# Patient Record
Sex: Male | Born: 1935 | Race: White | Hispanic: No | Marital: Married
Health system: Southern US, Community
[De-identification: ages and names within clinical notes are randomized; demographics above are authoritative.]

---

## 2006-11-16 ENCOUNTER — Ambulatory Visit: Payer: Self-pay | Admitting: Unknown Physician Specialty

## 2006-11-24 ENCOUNTER — Ambulatory Visit: Payer: Self-pay | Admitting: Unknown Physician Specialty

## 2007-02-01 ENCOUNTER — Ambulatory Visit: Payer: Self-pay | Admitting: Internal Medicine

## 2007-06-05 ENCOUNTER — Emergency Department: Payer: Self-pay | Admitting: Emergency Medicine

## 2007-06-20 ENCOUNTER — Ambulatory Visit: Payer: Self-pay | Admitting: Internal Medicine

## 2007-10-04 ENCOUNTER — Ambulatory Visit: Payer: Self-pay | Admitting: Internal Medicine

## 2007-12-20 ENCOUNTER — Ambulatory Visit: Payer: Self-pay | Admitting: Internal Medicine

## 2008-02-12 ENCOUNTER — Emergency Department: Payer: Self-pay | Admitting: Emergency Medicine

## 2008-11-13 IMAGING — CT CT CHEST W/ CM
1 of 2 series · 14 of 32 positions shown, 19 images · IV contrast (agent unspecified)
Comparison: none

REASON FOR EXAM: lung nodule
COMMENTS:

PROCEDURE:     CT  - CT CHEST WITH CONTRAST  - June 20, 2007 [DATE]
RESULT:     Comparison: Abdomen pelvis CT on 06/05/2007.
TECHNIQUE: CT examination of the chest was performed after intravenous
administration of 65 ml of 4sovue-HQM nonionic contrast. Collimation is 5
mm.

[Series 6: soft tissue · axial · 0.83mm/px · z∈[+636,+956]mm · 14 of 72 slices shown, 19 images]
[im 4/72  soft-tissue]
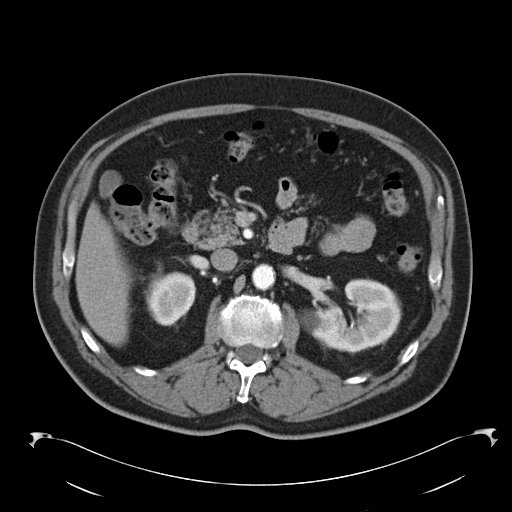
[im 4/72  bone]
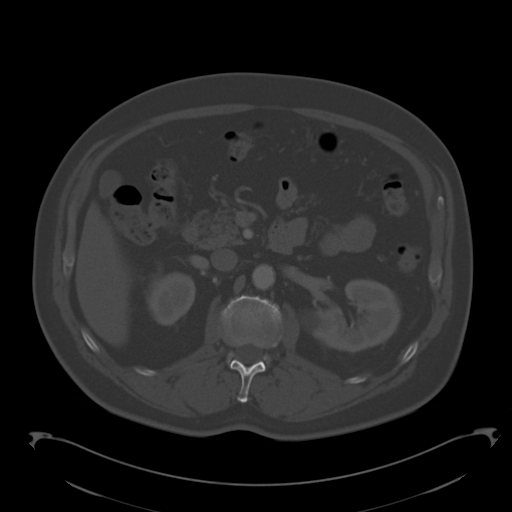
[im 11/72  soft-tissue]
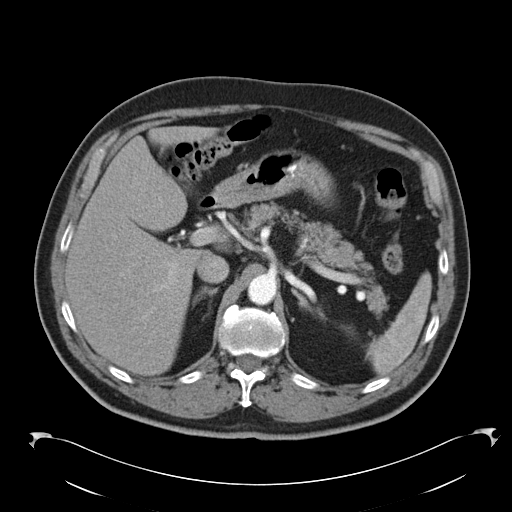
[im 15/72  soft-tissue]
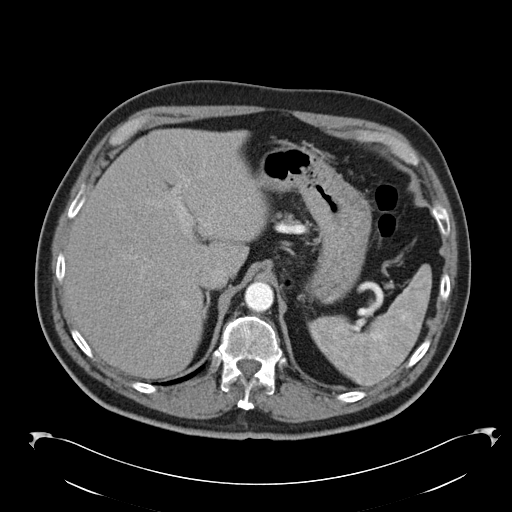
[im 22/72  soft-tissue]
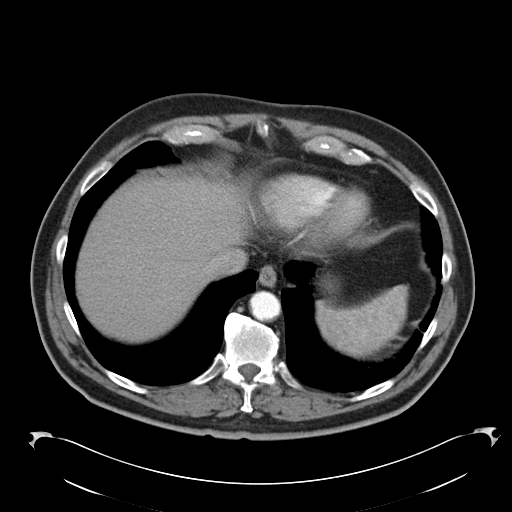
[im 25/72  soft-tissue]
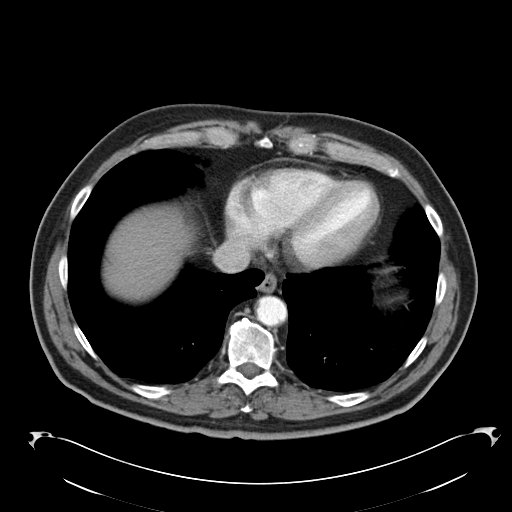
[im 32/72  soft-tissue]
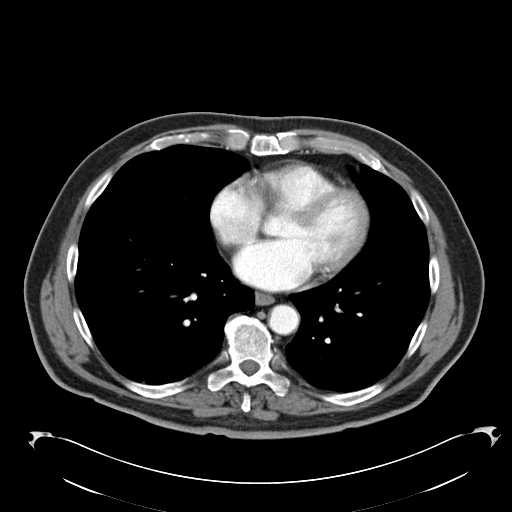
[im 36/72  soft-tissue]
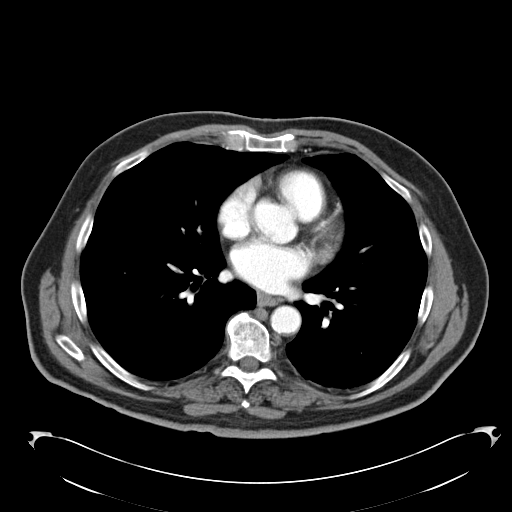
[im 40/72  soft-tissue]
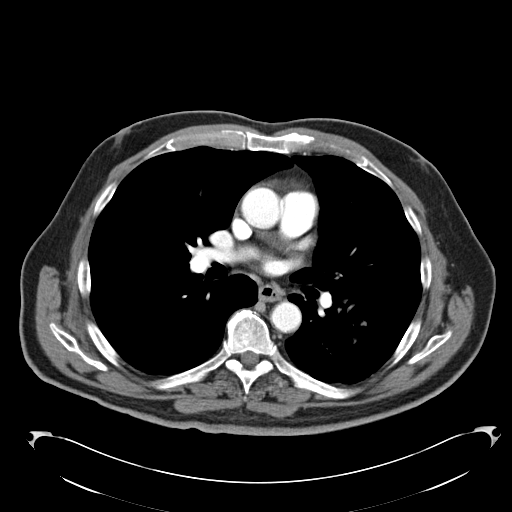
[im 47/72  soft-tissue]
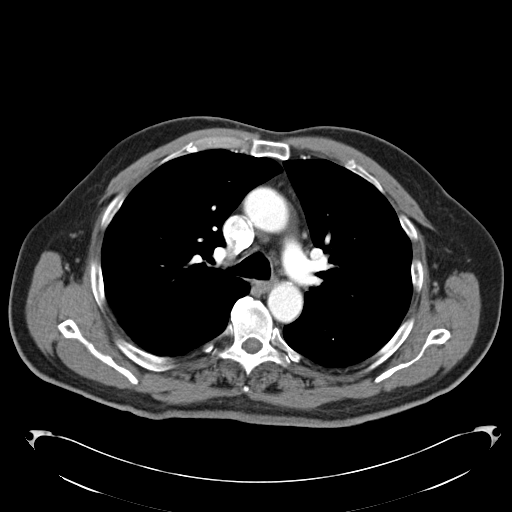
[im 47/72  bone]
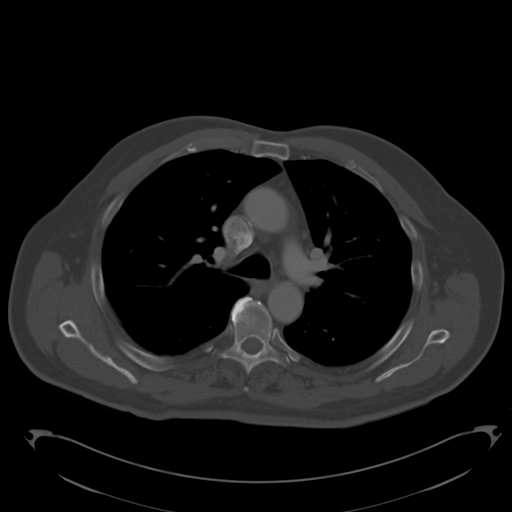
[im 50/72  soft-tissue]
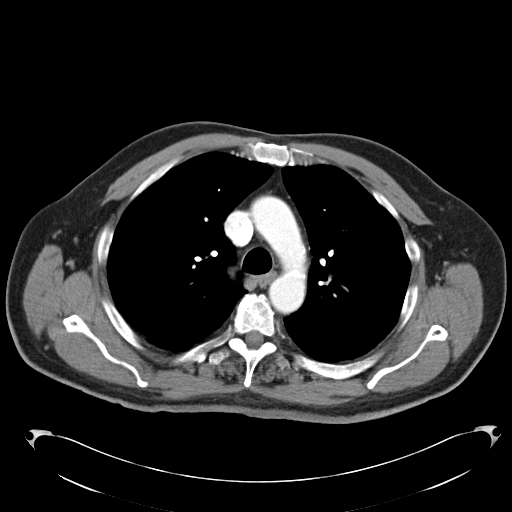
[im 57/72  soft-tissue]
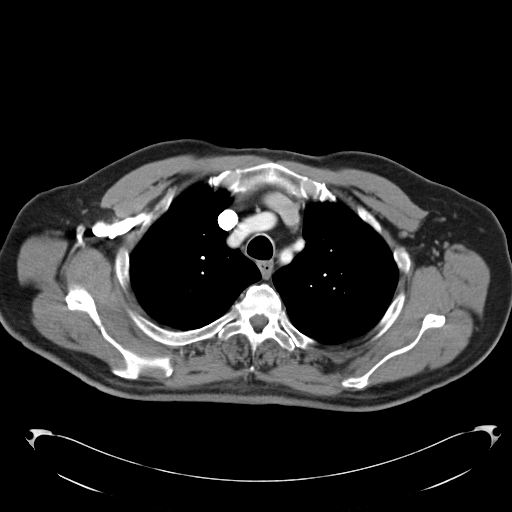
[im 57/72  lung]
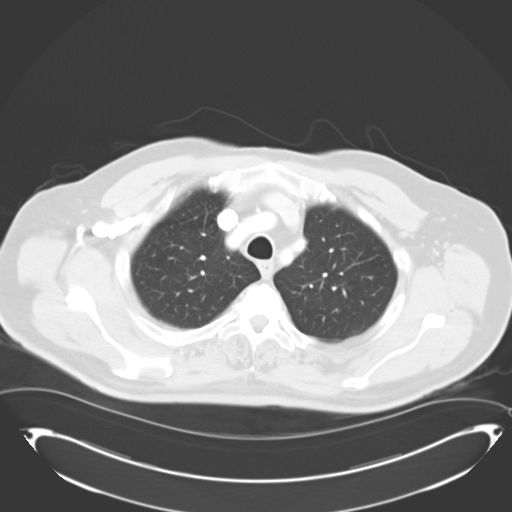
[im 61/72  soft-tissue]
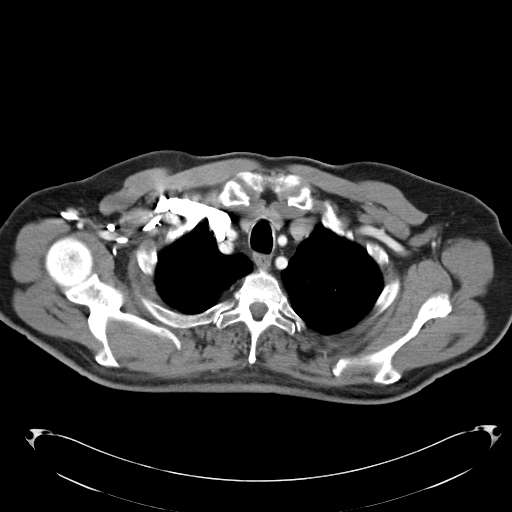
[im 61/72  lung]
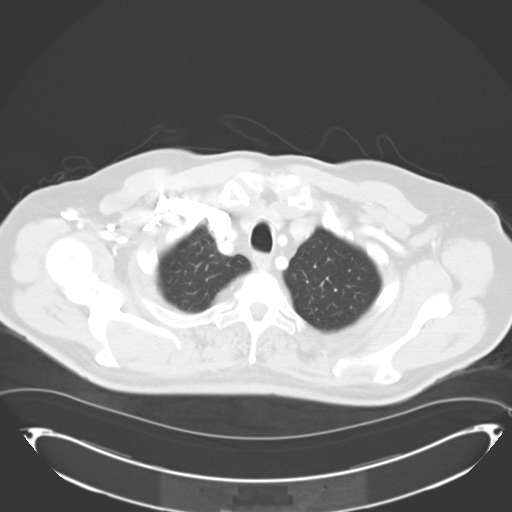
[im 64/72  lung]
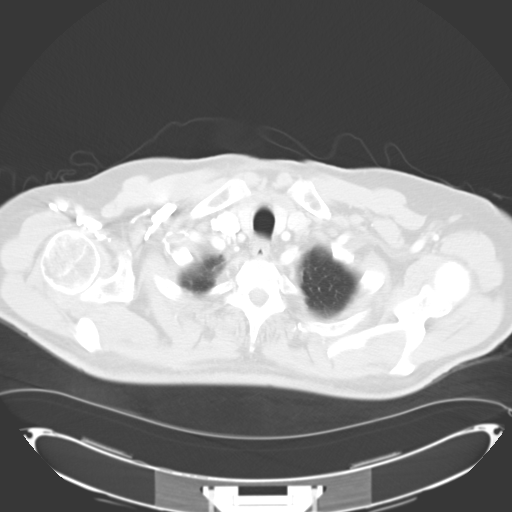
[im 68/72  soft-tissue]
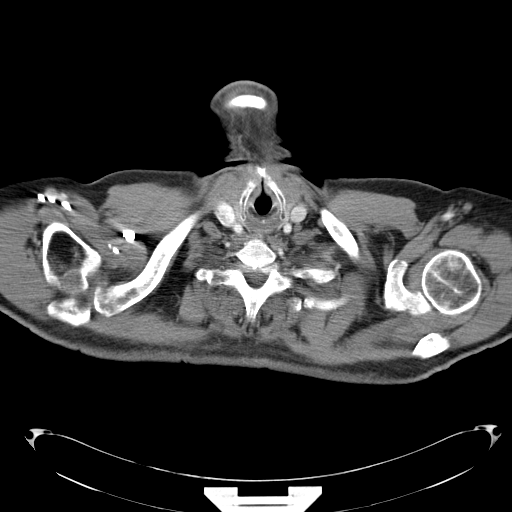
[im 68/72  lung]
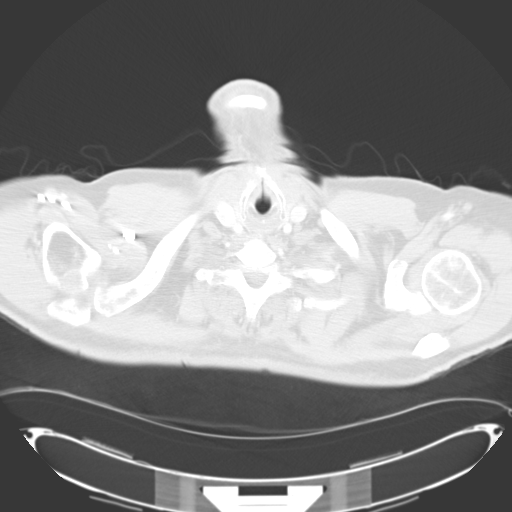

[14 of 32 positions shown; findings below may reference images not displayed]

FINDINGS: Centrilobular nodular opacities are seen involving the superior segment of
the left lower lobe. These can be due to aspiration or pneumonia. A 5 mm
nodular opacity is seen involving the right lower lobe on image 37.
Additional 5 mm nodular opacity is seen involving the right lower lobe along
the major fissure on image 44. These are indeterminate. There is no pleural
nor pericardial effusion. There is no pneumothorax. There are no enlarged
mediastinal, hilar, or axillary lymph nodes.

A 1.5 cm anterior right mid chest wall subcutaneous cyst is noted. No
displaced rib fracture is noted.

Limited visualization of the upper abdomen shows a 2.5 cm left interpolar
renal cyst.
IMPRESSION: 1. Centrilobular nodular opacities are seen involving the superior segment
of the left lower lobe. These can be due to aspiration or pneumonia.

2. Two 5 mm indeterminate nodular opacities are seen involving the right
lower lobe. They can be followed with chest CT in 6 months.

## 2008-12-23 ENCOUNTER — Ambulatory Visit: Payer: Self-pay | Admitting: Internal Medicine

## 2009-02-25 ENCOUNTER — Ambulatory Visit: Payer: Self-pay | Admitting: Internal Medicine

## 2009-08-25 ENCOUNTER — Ambulatory Visit: Payer: Self-pay | Admitting: Internal Medicine

## 2009-12-14 ENCOUNTER — Inpatient Hospital Stay: Payer: Self-pay | Admitting: Internal Medicine

## 2009-12-25 ENCOUNTER — Ambulatory Visit: Payer: Self-pay | Admitting: Cardiology

## 2010-01-13 ENCOUNTER — Ambulatory Visit: Payer: Self-pay | Admitting: Cardiology

## 2011-02-02 ENCOUNTER — Observation Stay: Payer: Self-pay | Admitting: Internal Medicine

## 2013-07-11 ENCOUNTER — Ambulatory Visit: Payer: Self-pay | Admitting: Vascular Surgery

## 2013-07-24 ENCOUNTER — Ambulatory Visit: Payer: Self-pay | Admitting: Vascular Surgery

## 2013-07-24 LAB — BASIC METABOLIC PANEL
Anion Gap: 7 (ref 7–16)
BUN: 25 mg/dL — AB (ref 7–18)
CHLORIDE: 107 mmol/L (ref 98–107)
CO2: 25 mmol/L (ref 21–32)
Calcium, Total: 8.8 mg/dL (ref 8.5–10.1)
Creatinine: 1.51 mg/dL — ABNORMAL HIGH (ref 0.60–1.30)
EGFR (African American): 48 — ABNORMAL LOW
EGFR (Non-African Amer.): 41 — ABNORMAL LOW
Glucose: 158 mg/dL — ABNORMAL HIGH (ref 65–99)
Osmolality: 285 (ref 275–301)
Potassium: 4.2 mmol/L (ref 3.5–5.1)
Sodium: 139 mmol/L (ref 136–145)

## 2013-07-24 LAB — URINALYSIS, COMPLETE
BILIRUBIN, UR: NEGATIVE
Bacteria: NONE SEEN
Blood: NEGATIVE
GLUCOSE, UR: NEGATIVE mg/dL (ref 0–75)
Ketone: NEGATIVE
Leukocyte Esterase: NEGATIVE
Nitrite: NEGATIVE
PROTEIN: NEGATIVE
Ph: 5 (ref 4.5–8.0)
RBC,UR: 1 /HPF (ref 0–5)
Specific Gravity: 1.021 (ref 1.003–1.030)
Squamous Epithelial: <1
WBC UR: <1 /HPF (ref 0–5)

## 2013-07-24 LAB — CBC
HCT: 40.3 % (ref 40.0–52.0)
HGB: 12.6 g/dL — AB (ref 13.0–18.0)
MCH: 23.1 pg — AB (ref 26.0–34.0)
MCHC: 31.2 g/dL — ABNORMAL LOW (ref 32.0–36.0)
MCV: 74 fL — AB (ref 80–100)
PLATELETS: 154 10*3/uL (ref 150–440)
RBC: 5.44 10*6/uL (ref 4.40–5.90)
RDW: 15.6 % — ABNORMAL HIGH (ref 11.5–14.5)
WBC: 5.7 10*3/uL (ref 3.8–10.6)

## 2013-07-26 ENCOUNTER — Ambulatory Visit: Payer: Self-pay | Admitting: Vascular Surgery

## 2013-08-01 ENCOUNTER — Inpatient Hospital Stay: Payer: Self-pay | Admitting: Vascular Surgery

## 2013-08-02 LAB — BASIC METABOLIC PANEL
ANION GAP: 9 (ref 7–16)
BUN: 20 mg/dL — ABNORMAL HIGH (ref 7–18)
CALCIUM: 7.8 mg/dL — AB (ref 8.5–10.1)
CREATININE: 1.68 mg/dL — AB (ref 0.60–1.30)
Chloride: 108 mmol/L — ABNORMAL HIGH (ref 98–107)
Co2: 22 mmol/L (ref 21–32)
EGFR (African American): 42 — ABNORMAL LOW
EGFR (Non-African Amer.): 36 — ABNORMAL LOW
Glucose: 161 mg/dL — ABNORMAL HIGH (ref 65–99)
Osmolality: 284 (ref 275–301)
Potassium: 4.3 mmol/L (ref 3.5–5.1)
Sodium: 139 mmol/L (ref 136–145)

## 2013-08-02 LAB — CBC WITH DIFFERENTIAL/PLATELET
Basophil #: 0 10*3/uL (ref 0.0–0.1)
Basophil %: 0.1 %
EOS ABS: 0 10*3/uL (ref 0.0–0.7)
EOS PCT: 0 %
HCT: 34 % — AB (ref 40.0–52.0)
HGB: 10.8 g/dL — AB (ref 13.0–18.0)
LYMPHS ABS: 0.7 10*3/uL — AB (ref 1.0–3.6)
LYMPHS PCT: 7.4 %
MCH: 23.4 pg — ABNORMAL LOW (ref 26.0–34.0)
MCHC: 31.8 g/dL — ABNORMAL LOW (ref 32.0–36.0)
MCV: 74 fL — AB (ref 80–100)
MONO ABS: 0.6 x10 3/mm (ref 0.2–1.0)
MONOS PCT: 6.5 %
NEUTROS ABS: 8.1 10*3/uL — AB (ref 1.4–6.5)
NEUTROS PCT: 86 %
PLATELETS: 136 10*3/uL — AB (ref 150–440)
RBC: 4.61 10*6/uL (ref 4.40–5.90)
RDW: 15.5 % — ABNORMAL HIGH (ref 11.5–14.5)
WBC: 9.5 10*3/uL (ref 3.8–10.6)

## 2013-08-02 LAB — PROTIME-INR
INR: 1.2
Prothrombin Time: 15.4 secs — ABNORMAL HIGH (ref 11.5–14.7)

## 2013-08-02 LAB — APTT: ACTIVATED PTT: 38.8 s — AB (ref 23.6–35.9)

## 2013-08-05 LAB — PATHOLOGY REPORT

## 2014-01-30 ENCOUNTER — Observation Stay: Payer: Self-pay | Admitting: Internal Medicine

## 2014-01-30 LAB — CBC
HCT: 39.3 % — ABNORMAL LOW (ref 40.0–52.0)
HGB: 12.3 g/dL — AB (ref 13.0–18.0)
MCH: 23.3 pg — ABNORMAL LOW (ref 26.0–34.0)
MCHC: 31.2 g/dL — ABNORMAL LOW (ref 32.0–36.0)
MCV: 75 fL — ABNORMAL LOW (ref 80–100)
Platelet: 180 10*3/uL (ref 150–440)
RBC: 5.26 10*6/uL (ref 4.40–5.90)
RDW: 15.9 % — ABNORMAL HIGH (ref 11.5–14.5)
WBC: 8.7 10*3/uL (ref 3.8–10.6)

## 2014-01-30 LAB — BASIC METABOLIC PANEL
Anion Gap: 8 (ref 7–16)
BUN: 23 mg/dL — ABNORMAL HIGH (ref 7–18)
CHLORIDE: 105 mmol/L (ref 98–107)
CO2: 28 mmol/L (ref 21–32)
CREATININE: 1.67 mg/dL — AB (ref 0.60–1.30)
Calcium, Total: 8.4 mg/dL — ABNORMAL LOW (ref 8.5–10.1)
EGFR (African American): 51 — ABNORMAL LOW
GFR CALC NON AF AMER: 42 — AB
Glucose: 136 mg/dL — ABNORMAL HIGH (ref 65–99)
Osmolality: 287 (ref 275–301)
POTASSIUM: 4.1 mmol/L (ref 3.5–5.1)
Sodium: 141 mmol/L (ref 136–145)

## 2014-01-30 LAB — CK-MB
CK-MB: 1.5 ng/mL (ref 0.5–3.6)
CK-MB: 1.4 ng/mL (ref 0.5–3.6)
CK-MB: 1.4 ng/mL (ref 0.5–3.6)

## 2014-01-30 LAB — TROPONIN I
Troponin-I: <0.02 ng/mL
Troponin-I: <0.02 ng/mL
Troponin-I: <0.02 ng/mL

## 2014-06-21 NOTE — Op Note (Signed)
PATIENT NAME:  Wesley Day, Wesley Day MR#:  [id number] DATE OF BIRTH:  [birth year]  DATE OF PROCEDURE:  08/01/2013  PREOPERATIVE DIAGNOSES: 1.  High-grade left carotid artery stenosis.  2.  Hypertension.  3.  Coronary disease.  4.  Hyperlipidemia.   POSTOPERATIVE DIAGNOSES: 1.  High-grade left carotid artery stenosis.  2.  Hypertension.  3.  Coronary disease.  4.  Hyperlipidemia.   PROCEDURE PERFORMED: Left carotid endarterectomy.   SURGEON: Festus BarrenJason Dew, M.D.   ASSISTANJenkins Rouge: Chelsey Haney, PA-C.   ANESTHESIA: General.   ESTIMATED BLOOD LOSS: 25 mL.   INDICATION FOR PROCEDURE: This is an 81 year old male with carotid disease. He was found to have a 75% or greater left carotid artery stenosis by ultrasound and CT. He is reasonably healthy and elected to have carotid endarterectomy for stroke risk reduction. Risks and benefits were discussed. Informed consent was obtained.   DESCRIPTION OF PROCEDURE: The patient was brought to the operative suite, and after an adequate level of general anesthesia was obtained, his left neck and chest were sterilely prepped and draped and a sterile surgical field was created. He was placed in modified beach chair position. After appropriate surgical timeout and intravenous antibiotics, an incision was created along the anterior border of the sternocleidomastoid and dissected down through the platysma with electrocautery. This dissected out the facial vein, which was ligated and divided between silk ties, as were a couple of venous branches. The carotid artery was then identified. It was dissected out and encircled with vessel loops and the common carotid artery, external carotid artery, superior thyroid artery and distal internal carotid artery beyond the lesion. The vessel was a large, generous vessel and the lesion was not particularly high, and I felt a primary closure would be our best option for closure today. The patient was systemically heparinized with 6000 units of  intravenous heparin for systemic anticoagulation. Control was then pulled up on the vessel loops and the anterior wall arteriotomy was created with an 11 blade and extended with Potts scissors. The Pruitt-Inahara shunt was placed first in the internal carotid artery, flushed and de-aired, then in the common carotid artery and flow was then restored. Approximately 1-1/2 to 2 minutes lapsed between clamping and restoration of flow with the shunt. An endarterectomy was then performed in the typical fashion. A Penfield elevator was used to create the dissection plane. The proximal endpoint was cut flush with tenotomy scissors. An eversion endarterectomy was performed on the external carotid artery. A nice feathered distal endpoint was created on the internal carotid artery with gentle traction. Three 7-0 Prolene tacking sutures were used to tack down the distal endpoint. All loose flecks were removed and the vessel was locally heparinized. We then started a 6-0 Prolene distal endpoint and ran this one half the length of the arteriotomy. A second 6-0 Prolene was started at the proximal endpoint and run approximately one quarter length of the arteriotomy. The shunt was then removed. The vessels flushed in the internal, external and common carotid arteries and locally heparinized. I then completed the suture line, flushing out the external carotid artery prior to release of control. Several cardiac cycles were traversed up the external carotid artery. The internal carotid artery was then released. Approximately 3 minutes elapsed from shunt removal to restoration of flow. A single 6-0 Prolene patch suture was used for hemostasis and hemostasis was achieved. The wound was irrigated. Surgicel and Evicel were placed. The wound was then closed with 3 interrupted 3-0 Vicryl sutures in  the sternocleidomastoid space. The platysma was closed with a running 3-0 Vicryl, and the skin was closed with a 4-0 Monocryl. Dermabond was  placed as a dressing. The patient was awakened from anesthesia and taken to the recovery room in stable condition.     ____________________________ Annice Needy, MD jsd:dmm D: 08/01/2013 14:47:02 ET T: 08/01/2013 19:57:15 ET JOB#: [id number]  cc: Annice Needy, MD, <Dictator> [name]. Dareen Piano, MD Annice Needy MD ELECTRONICALLY SIGNED 08/09/2013 12:34

## 2014-06-21 NOTE — H&P (Signed)
PATIENT NAME:  Wesley Day, Wesley Day MR#:  [id number] DATE OF BIRTH:  07-Jan-1926  DATE OF ADMISSION:  01/30/2014  REFERRING PHYSICIAN:  Phineas Semen, MD  PRIMARY CARE PHYSICIAN:  Einar Crow, MD  ADMITTING DIAGNOSIS:  Atypical chest pain.   HISTORY OF PRESENT ILLNESS: This is an 81 year old Caucasian male who presents to the Emergency Department complaining of chest pain. The patient states that it began when he got up from bed to use the restroom.  He laid back down once he was finished and experienced the pain that was substernal and seemed to radiate downward and to the left into his upper left quadrant.  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 Indeed, the pain is actually better at the time of this interview, but at its worse was 6/10 in severity. The patient states "that he felt like if he could just have a good bowel movement, he would feel better."  The patient denies any shortness of breath, nausea, vomiting or diaphoresis. However, due to his strong cardiac history, the Emergency Department called for admission for rule out of myocardial ischemia.   REVIEW OF SYSTEMS:  CONSTITUTIONAL: The patient denies fever or weakness.  EYES: Denies inflammation or blurred vision.  EARS, NOSE AND THROAT: Denies tinnitus or sore throat.  RESPIRATORY: Denies cough or shortness of breath.  CARDIOVASCULAR: Admits to vague chest pain as described above but denies palpitations, orthopnea, or paroxysmal nocturnal dyspnea.  GASTROINTESTINAL: Denies nausea, vomiting, diarrhea, or abdominal pain, but admits to a feeling of bloating, under his left rib cage.  GENITOURINARY: Denies increased frequency, dysuria, or hesitancy of urination. HEMATOLOGIC AND LYMPHATIC:  Denies easy bruising or bleeding.  INTEGUMENT: Denies rashes or lesions.  MUSCULOSKELETAL: Denies myalgias or arthralgias.   NEUROLOGIC: Denies numbness in his extremities or dysarthria.  PSYCHIATRIC:  Denies depression or suicidal ideation.   PAST MEDICAL HISTORY: Coronary artery disease, hypertension, gastroesophageal reflux disease, and hyperlipidemia.   PAST SURGICAL HISTORY: Carotid endarterectomy, stent placement x 3, elbow joint and ulnar nerve repair. Appendectomy and right hand fixation.    SOCIAL HISTORY: The patient lives with his wife. He does not smoke, drink or do any drugs.   FAMILY HISTORY: His father is deceased of coronary artery disease and his mother had congestive heart failure but is deceased of kidney disease.   MEDICATIONS:  1.  Acetaminophen with hydrocodone 325 mg/5 mg 1 tablet p.o. every 4 hours as needed for pain.  2.  Aspirin 81 mg 1 tablet p.o. at bedtime.  3.  Clopidigrel 75 mg 1 tablet p.o. every morning.  4.  Garlic 1 capsule p.o. daily.  5.  Losartan 100 mg 1 tablet p.o. daily.  6.  Metoprolol tartrate 50 mg 1 tablet p.o. b.i.d.  7.  Multivitamin 1 tablet p.o. every morning.  8.  Nitrostat 0.4 mg sublingually 1 tablet every 5 minutes as needed for chest pain x 3 doses.  9.  Omeprazole 20 mg 1 capsule p.o. daily.  10. Pravastatin 80 mg 1 tablet p.o. at bedtime.  11. Another multivitamin 1 tablet p.o. daily.  12. Vitamin E 400 international units 1 capsule p.o. daily.   ALLERGIES: ACE INHIBITORS.   PERTINENT LABORATORY RESULTS AND RADIOGRAPHIC FINDINGS:  Glucose is 136, BUN 23, creatinine is 1.67, sodium is 141, potassium 4.1, chloride is 105, bicarbonate 28, calcium is 8.4.  Troponin is negative.  White blood cell count is 8.7, hemoglobin is 12.3, hematocrit 39.3, platelet count is 180,000.  MCV  is 75. Chest x-ray shows left lung base with atelectasis versus scarring but no acute process.   PHYSICAL EXAMINATION:  VITAL SIGNS: Temperature is 97.4, pulse 68, respirations 18, blood pressure 161/79, pulse oximetry is 98% on room air.  GENERAL: The patient is alert and oriented x 3 in no apparent distress.  HEENT: Normocephalic, atraumatic. Pupils equal,  round, and reactive to light and accommodation. Extraocular movements are intact. Mucous membranes are moist.  NECK: Trachea is midline. No adenopathy.  CHEST: Symmetric and atraumatic.  CARDIOVASCULAR: Regular rate and rhythm. Normal S1, S2. No rubs, clicks, or murmurs appreciated.  LUNGS: Clear to auscultation bilaterally. Normal effort and excursion.  ABDOMEN: Positive bowel sounds. Soft, nontender, nondistended. No hepatosplenomegaly.  GENITOURINARY: Deferred.  MUSCULOSKELETAL: The patient moves all 4 extremities equally. There is 5/5 strength in upper and lower extremities bilaterally.  SKIN: No rashes or lesions.  EXTREMITIES: No clubbing, cyanosis, or edema.  NEUROLOGIC: Cranial nerves II through XII are grossly intact.  PSYCHIATRIC: Mood is normal. Affect is congruent.   ASSESSMENT AND PLAN: This is an 81 year old male admitted for chest pain.  1.  Chest pain seems atypical for cardiac pain. Due to his history of coronary artery disease, we will follow his cardiac enzymes.  EKG shows no signs of new ischemic changes. Cardiology consult has been ordered.  2.  Coronary artery disease. Continue aspirin and Plavix.  3.  Hypertension acceptable for now. We will continue losartan and metoprolol.  4.  Hyperlipidemia. Continue atorvastatin.  5.  Gastroesophageal reflux disease.  We will give the patient an H2-blocker as needed for reflux.  6.  Obesity. The patient's BMI is 31.3.  I have placed him on a heart healthy diet and stressed portion control.  7.  Deep vein thrombosis prophylaxis. Heparin.  8.  Gastrointestinal prophylaxis as above.    CODE STATUS:  The patient is a FULL CODE.   TIME SPENT ON ADMISSION ORDERS AND PATIENT CARE:  Approximately 35 minutes.     ____________________________ Kelton PillarMichael S. Sheryle Hailiamond, MD msd:DT D: 01/30/2014 08:31:51 ET T: 01/30/2014 08:47:05 ET JOB#: [id number]  cc: Kelton PillarMichael S. Sheryle Hailiamond, MD, <Dictator> Kelton PillarMICHAEL S Euretha Najarro MD ELECTRONICALLY SIGNED 01/31/2014  1:10

## 2014-06-21 NOTE — Consult Note (Signed)
   Present Illness 81 yo male with history of cad s/p- nstemi in 10/11 with placement of des in med left circ and om2. Had xience placed in proximal rca in 11/11 and left cea in 6/15. Was admitted with midsternal chest tightness different than his angina. He hasd b een compliant with his asa and plavix and metoprolol. He has ruled out for an mi and has no further symtpoms. Symptoms improved with GI cocktail. Ambulating with no chest pain or other symptoms   Physical Exam:  GEN well developed, well nourished, no acute distress   HEENT PERRL, moist oral mucosa   NECK supple  No masses   RESP normal resp effort   CARD Regular rate and rhythm  Normal, S1, S2  No murmur   ABD denies tenderness  normal BS   LYMPH negative neck, negative axillae   EXTR negative cyanosis/clubbing, negative edema   SKIN normal to palpation   NEURO motor/sensory function intact   PSYCH A+O to time, place, person   Review of Systems:  Subjective/Chief Complaint midsternal tightness   General: No Complaints   Skin: No Complaints   ENT: No Complaints   Eyes: No Complaints   Neck: No Complaints   Cardiovascular: Chest pain or discomfort   Gastrointestinal: Heartburn   Genitourinary: No Complaints   Vascular: No Complaints   Musculoskeletal: No Complaints   Neurologic: No Complaints   Hematologic: No Complaints   Endocrine: No Complaints   Psychiatric: No Complaints   Review of Systems: All other systems were reviewed and found to be negative   Medications/Allergies Reviewed Medications/Allergies reviewed   Family & Social History:  Family and Social History:  Family History Non-Contributory   Social History negative tobacco   Place of Living Home   EKG:  EKG NSR   Abnormal NSSTTW changes    Ace Inhibitors: Cough   Impression 81 yo male with history of cad s/p pci of circ, om1 and rca 4 years ago admitted with midsternal chest pain different than his angina. Has ruled  out for mi and ekg is unreramkable. Pain appears non ischemic.  OK for discharge on asa 81 mg daily; clopidogrel 75 mg daily; metoprolol tartrate 50 mg bid and parastatin 80 mg daily. Follow up with Dr. Darrold JunkerParaschos   Plan 1. OK for discharge to home on aforementioned meds 2. Low fat diet. 3. Follow up with Dr. Darrold JunkerParaschos in 7-10 days   Electronic Signatures: Dalia HeadingFath, Thorne Wirz A (MD)  (Signed 03-Dec-15 12:05)  Authored: General Aspect/Present Illness, History and Physical Exam, Review of System, Family & Social History, EKG , Allergies, Impression/Plan   Last Updated: 03-Dec-15 12:05 by Dalia HeadingFath, Nao Linz A (MD)

## 2019-03-09 LAB — BASIC METABOLIC PANEL
BKR ANION GAP: 6 — ABNORMAL LOW (ref 7–17)
BKR BLOOD UREA NITROGEN: 24 mg/dL — ABNORMAL HIGH (ref 8–23)
BKR BUN / CREAT RATIO: 15.6 (ref 8.0–23.0)
BKR CALCIUM: 8.8 mg/dL (ref 8.8–10.2)
BKR CHLORIDE: 106 mmol/L (ref 98–107)
BKR CO2: 25 mmol/L (ref 20–30)
BKR CREATININE: 1.54 mg/dL — ABNORMAL HIGH (ref 0.40–1.30)
BKR EGFR (AFR AMER): 52 mL/min/{1.73_m2} (ref >60)
BKR EGFR (NON AFRICAN AMERICAN): 43 mL/min/{1.73_m2} (ref >60)
BKR GLUCOSE: 153 mg/dL — ABNORMAL HIGH (ref 70–100)
BKR POTASSIUM: 4.7 mmol/L (ref 3.3–5.1)
BKR SODIUM: 137 mmol/L (ref 136–144)

## 2019-03-09 LAB — CBC WITH AUTO DIFFERENTIAL
BKR WAM ABSOLUTE IMMATURE GRANULOCYTES: 0 x 1000/ÂµL (ref 0.0–0.3)
BKR WAM ABSOLUTE LYMPHOCYTE COUNT: 1.2 x 1000/ÂµL (ref 1.0–4.0)
BKR WAM ABSOLUTE NRBC: 0 x 1000/ÂµL (ref 0.0–0.0)
BKR WAM ANALYZER ANC: 3.2 x 1000/ÂµL (ref 1.0–11.0)
BKR WAM BASOPHIL ABSOLUTE COUNT: 0.1 x 1000/ÂµL — ABNORMAL HIGH (ref 0.0–0.0)
BKR WAM BASOPHILS: 1 % (ref 0.0–4.0)
BKR WAM EOSINOPHIL ABSOLUTE COUNT: 0.8 x 1000/ÂµL (ref 0.0–1.0)
BKR WAM EOSINOPHILS: 13.9 % — ABNORMAL HIGH (ref 0.0–7.0)
BKR WAM HEMATOCRIT: 37.9 % (ref 37.0–52.0)
BKR WAM HEMOGLOBIN: 11.8 g/dL — ABNORMAL LOW (ref 12.0–18.0)
BKR WAM IMMATURE GRANULOCYTES: 0.3 % (ref 0.0–3.0)
BKR WAM LYMPHOCYTES: 19.7 % (ref 8.0–49.0)
BKR WAM MCH (PG): 23.6 pg — ABNORMAL LOW (ref 27.0–31.0)
BKR WAM MCHC: 31.1 g/dL (ref 31.0–36.0)
BKR WAM MCV: 76 fL — ABNORMAL LOW (ref 78.0–94.0)
BKR WAM MONOCYTE ABSOLUTE COUNT: 0.6 x 1000/ÂµL (ref 0.0–2.0)
BKR WAM MONOCYTES: 10.3 % (ref 4.0–15.0)
BKR WAM MPV: 10.1 fL (ref 6.0–11.0)
BKR WAM NEUTROPHILS: 54.8 % (ref 37.0–84.0)
BKR WAM NUCLEATED RED BLOOD CELLS: 0 % (ref 0.0–1.0)
BKR WAM PLATELETS: 169 x1000/ÂµL (ref 140–440)
BKR WAM RDW-CV: 15.8 % — ABNORMAL HIGH (ref 11.5–14.5)
BKR WAM RED BLOOD CELL COUNT: 5 M/ÂµL (ref 3.8–5.9)
BKR WAM WHITE BLOOD CELL COUNT: 5.9 x1000/ÂµL (ref 4.0–10.0)

## 2019-03-09 LAB — LIVER FUNCTION TESTS     (YH)
BKR ALANINE AMINOTRANSFERASE (ALT): 17 U/L (ref 9–59)
BKR ALKALINE PHOSPHATASE: 77 U/L (ref 9–122)
BKR ASPARTATE AMINOTRANSFERASE (AST): 35 U/L (ref 10–35)
BKR AST/ALT RATIO: 2.1 (ref 0.3–4.9)
BKR BILIRUBIN DIRECT: 0.2 mg/dL (ref <=0.3)
BKR BILIRUBIN TOTAL: 0.5 mg/dL (ref <=1.2)

## 2019-03-09 LAB — ZZZTROPONIN T     (Q): BKR TROPONIN T: <0.01 ng/mL (ref <0.01)

## 2019-03-09 LAB — LIPASE: BKR LIPASE: 22 U/L (ref 11–55)

## 2019-03-10 LAB — CBC WITH AUTO DIFFERENTIAL
BKR WAM ABSOLUTE IMMATURE GRANULOCYTES: 0 x 1000/ÂµL (ref 0.0–0.3)
BKR WAM ABSOLUTE LYMPHOCYTE COUNT: 1.3 x 1000/ÂµL (ref 1.0–4.0)
BKR WAM ABSOLUTE NRBC: 0 x 1000/ÂµL (ref 0.0–0.0)
BKR WAM ANALYZER ANC: 3.8 x 1000/ÂµL (ref 1.0–11.0)
BKR WAM BASOPHIL ABSOLUTE COUNT: 0 x 1000/ÂµL (ref 0.0–0.0)
BKR WAM BASOPHILS: 0.6 % (ref 0.0–4.0)
BKR WAM EOSINOPHIL ABSOLUTE COUNT: 0.7 x 1000/ÂµL (ref 0.0–1.0)
BKR WAM EOSINOPHILS: 10 % — ABNORMAL HIGH (ref 0.0–7.0)
BKR WAM HEMATOCRIT: 37.4 % (ref 37.0–52.0)
BKR WAM HEMOGLOBIN: 11.4 g/dL — ABNORMAL LOW (ref 12.0–18.0)
BKR WAM IMMATURE GRANULOCYTES: 0.2 % (ref 0.0–3.0)
BKR WAM LYMPHOCYTES: 20.1 % (ref 8.0–49.0)
BKR WAM MCH (PG): 22.9 pg — ABNORMAL LOW (ref 27.0–31.0)
BKR WAM MCHC: 30.5 g/dL — ABNORMAL LOW (ref 31.0–36.0)
BKR WAM MCV: 75.3 fL — ABNORMAL LOW (ref 78.0–94.0)
BKR WAM MONOCYTE ABSOLUTE COUNT: 0.8 x 1000/ÂµL (ref 0.0–2.0)
BKR WAM MONOCYTES: 11.3 % (ref 4.0–15.0)
BKR WAM MPV: 10.6 fL (ref 6.0–11.0)
BKR WAM NEUTROPHILS: 57.8 % (ref 37.0–84.0)
BKR WAM NUCLEATED RED BLOOD CELLS: 0 % (ref 0.0–1.0)
BKR WAM PLATELETS: 157 x1000/ÂµL (ref 140–440)
BKR WAM RDW-CV: 15.8 % — ABNORMAL HIGH (ref 11.5–14.5)
BKR WAM RED BLOOD CELL COUNT: 5 M/ÂµL (ref 3.8–5.9)
BKR WAM WHITE BLOOD CELL COUNT: 6.6 x1000/ÂµL (ref 4.0–10.0)

## 2019-03-10 LAB — BASIC METABOLIC PANEL
BKR ANION GAP: 8 (ref 7–17)
BKR BLOOD UREA NITROGEN: 19 mg/dL (ref 8–23)
BKR BUN / CREAT RATIO: 13.1 (ref 8.0–23.0)
BKR CALCIUM: 8.8 mg/dL (ref 8.8–10.2)
BKR CHLORIDE: 103 mmol/L (ref 98–107)
BKR CO2: 26 mmol/L (ref 20–30)
BKR CREATININE: 1.45 mg/dL — ABNORMAL HIGH (ref 0.40–1.30)
BKR EGFR (AFR AMER): 55 mL/min/{1.73_m2} (ref >60)
BKR EGFR (NON AFRICAN AMERICAN): 46 mL/min/{1.73_m2} (ref >60)
BKR GLUCOSE: 104 mg/dL — ABNORMAL HIGH (ref 70–100)
BKR POTASSIUM: 4.2 mmol/L (ref 3.3–5.1)
BKR SODIUM: 137 mmol/L (ref 136–144)

## 2019-03-10 LAB — MAGNESIUM: BKR MAGNESIUM: 2 mg/dL (ref 1.7–2.4)

## 2019-03-10 LAB — PHOSPHORUS     (BH GH L LMW YH): BKR PHOSPHORUS: 2.9 mg/dL (ref 2.2–4.5)

## 2019-03-10 LAB — ZZZTROPONIN T     (Q): BKR TROPONIN T: <0.01 ng/mL (ref <0.01)

## 2019-03-10 LAB — SARS COV-2 (COVID-19) RNA: BKR SARS-COV-2 RNA (COVID-19) (YH): NEGATIVE

## 2019-04-02 ENCOUNTER — Ambulatory Visit: Admit: 2019-04-02 | Payer: PRIVATE HEALTH INSURANCE | Attending: Cardiovascular Disease | Primary: Internal Medicine

## 2019-04-02 ENCOUNTER — Encounter: Admit: 2019-04-02 | Payer: PRIVATE HEALTH INSURANCE | Primary: Internal Medicine

## 2019-04-10 ENCOUNTER — Ambulatory Visit: Admit: 2019-04-10 | Payer: PRIVATE HEALTH INSURANCE | Attending: Cardiovascular Disease | Primary: Internal Medicine

## 2019-04-26 ENCOUNTER — Encounter: Admit: 2019-04-26 | Payer: PRIVATE HEALTH INSURANCE | Attending: Cardiovascular Disease | Primary: Internal Medicine

## 2019-04-26 ENCOUNTER — Ambulatory Visit: Admit: 2019-04-26 | Payer: PRIVATE HEALTH INSURANCE | Attending: Cardiovascular Disease | Primary: Internal Medicine

## 2019-04-26 DIAGNOSIS — I714 Abdominal aortic aneurysm (AAA) without rupture (HC Code): Secondary | ICD-10-CM

## 2019-04-26 DIAGNOSIS — R0609 Other forms of dyspnea: Secondary | ICD-10-CM

## 2019-04-26 DIAGNOSIS — I2510 Atherosclerotic heart disease of native coronary artery without angina pectoris: Secondary | ICD-10-CM

## 2019-04-26 DIAGNOSIS — I10 Essential (primary) hypertension: Secondary | ICD-10-CM

## 2019-04-26 NOTE — Patient Instructions
Call the office [phone], ask to speak to a nursGo over all your medications, the mgs and how often per day you take themLet her know if you need any refills

## 2019-04-26 NOTE — Other
[facility] 	Cardiology Consult Note Consult Information: Consultation requested by: selfReason for consultation: establish cardiac careSource of Information: Patient and EMR/Previous RecordPresentation History: HPI: This is a 86 y.o. male with a history of CAD s/p PCI x3 stents in 2011, AAA, and hypertension who presents for cardiac evaluation and to transfer cardiac care.  The patient has a remote history of an IMI.  He underwent stenting to his right coronary artery and left circumflex many years ago.He is a 86 y.o. male who becomes short of breath walking distances, roughly 1/4 of a mile. He typically walks down to the mail box before becoming symptomatic. He does not exercise on a regular basis and believes his symptoms may be secondary to deconditioning. The patient denies any chest pain, palpitations, or peripheral edema. He denies any orthopnea or PND. No symptoms suggestive of sleep apnea. No asthma or COPD. He denies any syncope, falls, loss of consciousness or symptoms suggestive of TIA. He ambulates with a cane.  He has not used sublingual nitroglycerin in over 2 years.The patient underwent cardiac catheterization in April 2019 that demonstrated 50% mid LAD lesion and patient RCA and LCx stents. He has a known abdominal aortic aneurysm that was previously followed by vascular surgery in [place].  His last echocardiogram was in September 2020 which summarized below. He was hospitalized in January 2021 for acute cholecystitis s/p cholecystectomy and doing well postoperatively. He denies any further abdominal pain or discomfort. Review of Social History:  He lives with his wife and daughter.  He has 3 daughters.Occupation:  Retired Location manager: quit smoking 40+ years agoCaffeine: 2 cups coffee daily Alcohol: noneReview of Allergies/Medical History/Medications: No past medical history on file.No past surgical history on file.Outpatient Medications Marked as Taking for the 04/26/19 encounter (Office Visit) with [name]., MD Medication Sig Dispense Refill ? amLODIPine (NORVASC) 2.5 mg tablet Take 2.5 mg by mouth daily.   ? aspirin 81 mg EC delayed release tablet Take 81 mg by mouth daily.   ? atorvastatin (LIPITOR) 80 mg tablet Take 80 mg by mouth daily.   ? b complex vitamins capsule Take 1 capsule by mouth daily.   ? carvediloL (COREG) 12.5 mg Immediate Release tablet Take 12.5 mg by mouth 2 (two) times daily with breakfast and dinner.   ? clopidogreL (PLAVIX) 75 mg tablet Take 75 mg by mouth daily.   ? gabapentin (NEURONTIN) 100 mg capsule Take 200 mg by mouth daily.   ? isosorbide mononitrate (IMDUR) 30 mg 24 hr extended release tablet Take 30 mg by mouth daily.   ? losartan (COZAAR) 100 mg tablet Take 100 mg by mouth daily.   ? multivitamin (MULTIVITAMIN) tablet Take 1 tablet by mouth daily.   ? nitroGLYCERIN (NITROSTAT) 0.4 mg SL tablet Place 0.4 mg under the tongue every 5 (five) minutes as needed for chest pain. If no relief, call 911. May repeat every 5 minutes for a total of 3 tablets.   ? omeprazole (PRILOSEC) 20 mg capsule Take 20 mg by mouth daily.   ? oxyCODONE (ROXICODONE) 5 mg Immediate Release tablet Take 1 tablet (5 mg total) by mouth every 4 (four) hours as needed. 5 tablet 0 ? senna-docusate (SENNA-PLUS) 8.6-50 mg tablet Take 2 tablets by mouth daily as needed for constipation. 14 tablet 1 No Known AllergiesReview of Systems: Review of Systems Constitutional: Negative.  HENT: Positive for hearing loss.  Eyes: Negative.  Respiratory:      HPI Gastrointestinal:  HPI Endocrine: Negative.  Genitourinary: Negative.  Musculoskeletal: Negative.  Allergic/Immunologic: Negative.  Neurological: Negative.  Hematological: Negative.  Psychiatric/Behavioral: Negative.  Physical Exam: Vitals:BP 118/62  - Pulse 68  - Ht 5' 11 (1.803 m)  - Wt 96.6 kg  - SpO2 99%  - BMI 29.71 kg/m?  (213 lbs.)Vitals:  04/26/19 0900 BP: 118/62 Pulse: 68 SpO2: 99% Weight: 96.6 kg Height: 5' 11 (1.803 m) Physical Exam Constitutional: He is oriented to person, place, and time. He appears well-developed and well-nourished. No distress. Neck: Normal range of motion. Neck supple. No JVD present. Carotids 2 + bilaterally. No bruit. S/p Left CEA Cardiovascular: Normal rate and regular rhythm. Exam reveals no friction rub. Murmur (soft apical murmur of MR) heard.Pulmonary/Chest: Effort normal and breath sounds normal. He has no wheezes. He has no rales. Abdominal: Soft. Bowel sounds are normal. There is no abdominal tenderness. There is no rebound. No hepatojugular reflux. S/p cholecystectomy  Musculoskeletal: Normal range of motion.       General: No edema. Neurological: He is alert and oriented to person, place, and time. Skin: Skin is warm and dry. Psychiatric: His behavior is normal. Review of Labs/Diagnostics: Lab Review:Last CMP: CO2 Date/Time Value Ref Range Status 03/10/2019 0354 26 20 - 30 mmol/L Final CO2 Calculated, Venous (POC) Date/Time Value Ref Range Status 03/09/2019 1751 26.0 22.0 - 30.0 % Final Glucose Date/Time Value Ref Range Status 03/10/2019 0354 104 (H) 70 - 100 mg/dL Final eGFR (NON African-American) Date/Time Value Ref Range Status 03/10/2019 0354 46 >60 mL/min/1.21m2 Final   Comment:   Values under 37mL/min/1.73m2 may indicate CKD if noted for  more than 3 months. eGFR is only valid if creatinine is at steady state. eGFR (Afr Amer) Date/Time Value Ref Range Status 03/10/2019 0354 55 >60 mL/min/1.79m2 Final   Comment:   Values under 54mL/min/1.73m2 may indicate CKD if noted for  more than 3 months. eGFR is only valid if creatinine is at steady state. Calcium Date/Time Value Ref Range Status 03/10/2019 0354 8.8 8.8 - 10.2 mg/dL Final AST/ALT Ratio Date/Time Value Ref Range Status 03/09/2019 1727 2.1 0.3 - 4.9 Final BUN Date/Time Value Ref Range Status 03/10/2019 0354 19 8 - 23 mg/dL Final BUN/Creatinine Ratio Date/Time Value Ref Range Status 03/10/2019 0354 13.1 8.0 - 23.0 Final Creatinine Date/Time Value Ref Range Status 03/10/2019 0354 1.45 (H) 0.40 - 1.30 mg/dL Final Lipid Panel Reflex Direct LDL Ref Range & Units 10/30/18 10:50 AM Cholesterol Total External <200 mg/dL 99  Cholesterol HDL External > OR = 40 mg/dL 95MWU   Triglycerides <132 mg/dL 94  LDL Cholesterol ?mg/dL (calc) 50  Comment: Reference range: <100 Desirable range <100 mg/dL for primary prevention; ?  Cholesterol/HDL Ratio <5.0 (calc) 3.2  Non HDL Chol. (LDL+VLDL) <130 mg/dL (calc) 68   Last G4W: No components found for: LABA1CDiagnostic Review: [place] Abdomen pelvis 1/09/21IMPRESSION:1.  Cholelithiasis with small amount of pericholecystic fluid and subtle stranding around the gallbladder could relate to cholecystitis. Please correlate clinically. Further interrogation with an ultrasound of the gallbladder can be considered.2.  4.5 cm partially thrombosed infrarenal abdominal aortic aneurysm.3.  Bilateral solid pulmonary nodules measuring up to 7 mm. Follow-up chest [place] in 6 months can be considered to confirm stability.?Cardiac Catheterization Result Date: 4/1/2019Summary/AssessmentSelective coronary angiography in Mr. Cuevas has demonstrated mild one-vessel coronary disease in a right dominantsystem with a 50% distal LAD lesion. The remainder the patient's epicardial coronary anatomy is notable for diffusenonobstructive disease. Of note, the previously placed RCA and circumflex stents remain widely patent.  An obviousculprit lesion underlying the patient's chest pain and cardiac enzyme elevations was not identified.Left ventriculography was not performed in this patient given the patient's kidney function. Two-dimensionalechocardiography is indicated to assess ventricular and valvular structure and function. Left heart catheterization hasdemonstrated a normal left ventricular end-diastolic pressure.The patient is a candidate for ongoing medical therapy. Noncardiac causes of chest pain may require furtherevaluation.Diagnosis1. Mild one-vessel coronary disease2. Normal left ventricular end-diastolic pressure ECG/Tele Events: Independently interpreted by me:ECG: Normal sinus rhythm, left axis deviation, no acute ST or T-wave changesEcho Findings: Impression and Plan: 86 year old male with known coronary artery disease status post remote history of an IMI by patient's history, longstanding, well controlled hypertension, status post a left carotid endarterectomy, with a history of a known abdominal aortic aneurysm presents to establish cardiac care.  He used to be followed by Dr. Molinda Bailiff at [facility].  Over the past few years, the patient has been stable from a cardiovascular standpoint without exertional angina.  He denies congestive symptoms.  He has not needed nitroglycerin in several years.  He reports compliance with his medications.  He denies palpitations or any symptoms suggestive of a TIA.  There is no history of paroxysmal atrial fibrillation or congestive heart failure.Today on exam, the patient's blood pressure is well controlled, he is in sinus rhythm.  There is no congestive heart failure.  There is no appreciable carotid bruit.  His EKG is unremarkable.Recent cardiac imaging including a cardiac catheterization 2 years ago demonstrated nonobstructive CAD.  Echocardiography in September of 2020 demonstrated LVH with normal left ventricular systolic function with moderate diastolic dysfunction.Currently, the patient is stable on his current medical regimen.  There is no need to repeat cardiac imaging.  I will have the patient call the office to confirm his medications.  I have asked the patient to return in 6 months for for routine follow-up.  At that time I will repeat an abdominal ultrasound to check the size of his abdominal aortic aneurysm..  ICD-10-CM  1. DOE (dyspnea on exertion)  R06.00 US Abdominal Aorta Complete ([facility] NEPM) 2. Essential hypertension  I10 EKG (Clinic Performed)   US Abdominal Aorta Complete ([facility] NE[facility]) 3. Coronary artery disease involving native coronary artery of native heart without angina pectoris  I25.10 US Abdominal Aorta Complete ([facility] NE[facility]) 4. Abdominal aortic aneurysm (AAA) without rupture (HC Code)  I71.4 US Abdominal Aorta Complete [facility] NE[facility]) Scribed for Dr. Charleston Ropes by Arlan Organ, medical scribe. Electronically Signed by Arlan Organ, February 26, 2021The documentation recorded by the scribe accurately reflects the service I personally performed and the decisions made by me.Signed: Charleston Ropes, MD.  For questions [phone]/26/20219:08 AM

## 2019-04-27 DIAGNOSIS — R0600 Dyspnea, unspecified: Secondary | ICD-10-CM

## 2019-09-29 ENCOUNTER — Emergency Department: Admit: 2019-09-29 | Payer: PRIVATE HEALTH INSURANCE | Attending: Diagnostic Radiology | Primary: Internal Medicine

## 2019-09-29 ENCOUNTER — Inpatient Hospital Stay: Admit: 2019-09-29 | Discharge: 2019-09-29 | Payer: PRIVATE HEALTH INSURANCE | Primary: Internal Medicine

## 2019-09-29 ENCOUNTER — Emergency Department: Admit: 2019-09-29 | Payer: PRIVATE HEALTH INSURANCE | Primary: Internal Medicine

## 2019-09-29 ENCOUNTER — Encounter: Admit: 2019-09-29 | Payer: PRIVATE HEALTH INSURANCE | Primary: Internal Medicine

## 2019-09-29 DIAGNOSIS — E785 Hyperlipidemia, unspecified: Secondary | ICD-10-CM

## 2019-09-29 DIAGNOSIS — R55 Syncope and collapse: Secondary | ICD-10-CM

## 2019-09-29 DIAGNOSIS — I2510 Atherosclerotic heart disease of native coronary artery without angina pectoris: Secondary | ICD-10-CM

## 2019-09-29 DIAGNOSIS — I10 Essential (primary) hypertension: Secondary | ICD-10-CM

## 2019-09-29 LAB — URINE MICROSCOPIC     (BH GH LMW YH): BKR HYALINE CASTS, UA MANUAL: >10 /LPF — AB (ref 0–3)

## 2019-09-29 LAB — URINALYSIS WITH CULTURE REFLEX      (BH LMW YH)
BKR GLUCOSE, UA: NEGATIVE
BKR LEUKOCYTE ESTERASE, UA: NEGATIVE
BKR NITRITE, UA: NEGATIVE x 1000/??L (ref 0.0–2.0)
BKR PH, UA: 5.5 (ref 5.5–7.5)
BKR SPECIFIC GRAVITY, UA: 1.024 — ABNORMAL HIGH (ref 1.005–1.020)
BKR UROBILINOGEN, UA: 0.2 EU/dL (ref <=2.0)

## 2019-09-29 LAB — BASIC METABOLIC PANEL
BKR ANION GAP: 10 g/dL — ABNORMAL LOW (ref 7–17)
BKR ANION GAP: 8 (ref 7–17)
BKR BLOOD UREA NITROGEN: 26 mg/dL — ABNORMAL HIGH (ref 8–23)
BKR BLOOD UREA NITROGEN: 26 mg/dL — ABNORMAL HIGH (ref 8–23)
BKR BUN / CREAT RATIO: 12.9 x1000/??L (ref 8.0–23.0)
BKR BUN / CREAT RATIO: 13.8 (ref 8.0–23.0)
BKR CALCIUM: 8.4 mg/dL — ABNORMAL LOW (ref 8.8–10.2)
BKR CALCIUM: 9 mg/dL (ref 8.8–10.2)
BKR CHLORIDE: 102 mmol/L (ref 98–107)
BKR CO2: 24 mmol/L (ref 20–30)
BKR CO2: 24 mmol/L (ref 20–30)
BKR CREATININE: 1.89 mg/dL — ABNORMAL HIGH (ref 0.40–1.30)
BKR CREATININE: 2.01 mg/dL — ABNORMAL HIGH (ref 0.40–1.30)
BKR EGFR (AFR AMER): 38 mL/min/1.73m2 — AB (ref >60)
BKR EGFR (AFR AMER): 41 mL/min/{1.73_m2} (ref >60)
BKR EGFR (NON AFRICAN AMERICAN): 31 mL/min/1.73m2 (ref >60)
BKR EGFR (NON AFRICAN AMERICAN): 34 mL/min/{1.73_m2} (ref >60)
BKR GLUCOSE: 134 mg/dL — ABNORMAL HIGH (ref 70–100)
BKR GLUCOSE: 172 mg/dL — ABNORMAL HIGH (ref 70–100)
BKR POTASSIUM: 4.3 mmol/L (ref 3.3–5.1)
BKR POTASSIUM: 4.4 mmol/L (ref 3.3–5.1)
BKR SODIUM: 136 mmol/L (ref 136–144)
BKR SODIUM: 138 mmol/L (ref 136–144)
BKR WAM RDW-CV: 9 mg/dL — ABNORMAL HIGH (ref 8.8–10.2)

## 2019-09-29 LAB — PROTIME AND INR
BKR INR: 1.06 g/dL (ref 0.92–1.08)
BKR PROTHROMBIN TIME: 11.6 s (ref 10.1–11.7)

## 2019-09-29 LAB — CBC WITH AUTO DIFFERENTIAL
BKR WAM ABSOLUTE IMMATURE GRANULOCYTES: 0 x 1000/ÂµL (ref 0.0–0.3)
BKR WAM ABSOLUTE LYMPHOCYTE COUNT: 1.1 x 1000/ÂµL (ref 1.0–4.0)
BKR WAM ANALYZER ANC: 3.6 x 1000/ÂµL (ref 1.0–11.0)
BKR WAM BASOPHIL ABSOLUTE COUNT: 0 x 1000/ÂµL (ref 0.0–0.0)
BKR WAM BASOPHILS: 0.7 % (ref 0.0–4.0)
BKR WAM EOSINOPHIL ABSOLUTE COUNT: 0.3 x 1000/??L (ref 0.0–1.0)
BKR WAM EOSINOPHILS: 5.2 % (ref 0.0–7.0)
BKR WAM HEMATOCRIT: 36.2 % — ABNORMAL LOW (ref 37.0–52.0)
BKR WAM HEMOGLOBIN: 11.7 g/dL — ABNORMAL LOW (ref 12.0–18.0)
BKR WAM IMMATURE GRANULOCYTES: 0.2 % (ref 0.0–3.0)
BKR WAM LYMPHOCYTES: 18.7 % (ref 8.0–49.0)
BKR WAM MCH (PG): 23.9 pg — ABNORMAL LOW (ref 27.0–31.0)
BKR WAM MCHC: 32.3 g/dL (ref 31.0–36.0)
BKR WAM MCV: 74 fL — ABNORMAL LOW (ref 78.0–94.0)
BKR WAM MONOCYTE ABSOLUTE COUNT: 0.6 x 1000/ÂµL (ref 0.0–2.0)
BKR WAM MONOCYTES: 11 % (ref 4.0–15.0)
BKR WAM MPV: 10.7 fL (ref 6.0–11.0)
BKR WAM NEUTROPHILS: 64.2 % (ref 37.0–84.0)
BKR WAM NUCLEATED RED BLOOD CELLS: 0 % (ref 0.0–1.0)
BKR WAM PLATELETS: 155 x1000/ÂµL (ref 140–440)
BKR WAM RED BLOOD CELL COUNT: 4.9 M/??L (ref 3.8–5.9)
BKR WAM WHITE BLOOD CELL COUNT: 5.6 x1000/ÂµL (ref 4.0–10.0)

## 2019-09-29 LAB — BLOOD GAS, VENOUS (BH YH)
BKR BASE EXCESS, VENOUS: 3 mmol/L
BKR O2 SATURATION VENOUS: 43 %
BKR PCO2, VENOUS: 49 mmHg (ref 38–54)
BKR PH, VENOUS: 7.37 units (ref 7.32–7.43)
BKR PO2, VENOUS: <30 mmHg — ABNORMAL LOW (ref 40–50)

## 2019-09-29 LAB — HEPATIC FUNCTION PANEL
BKR A/G RATIO: 1.4 (ref 1.0–2.2)
BKR ALANINE AMINOTRANSFERASE (ALT): 15 U/L (ref 9–59)
BKR ALBUMIN: 3.8 g/dL (ref 3.6–4.9)
BKR ALKALINE PHOSPHATASE: 68 U/L (ref 9–122)
BKR ASPARTATE AMINOTRANSFERASE (AST): 19 U/L (ref 10–35)
BKR AST/ALT RATIO: 1.3
BKR BILIRUBIN DIRECT: 0.3 mg/dL (ref <=0.3)
BKR BILIRUBIN TOTAL: 1.2 mg/dL (ref <=1.2)
BKR CALCULATED HCO3, VENOUS: 3.8 g/dL (ref 3.6–4.9)
BKR GLOBULIN: 2.8 g/dL (ref 2.3–3.5)
BKR PROTEIN TOTAL: 6.6 g/dL (ref 6.6–8.7)

## 2019-09-29 LAB — LIPASE: BKR LIPASE: 26 U/L (ref 11–55)

## 2019-09-29 LAB — MAGNESIUM: BKR MAGNESIUM: 1.9 mg/dL (ref 1.7–2.4)

## 2019-09-29 MED ORDER — SODIUM CHLORIDE 0.9 % BOLUS (NEW BAG)
0.9 % | INTRAVENOUS | Status: CP
Start: 2019-09-29 — End: ?
  Administered 2019-09-29: 19:00:00 0.9 mL/h via INTRAVENOUS

## 2019-09-29 MED ORDER — SODIUM CHLORIDE 0.9 % BOLUS (NEW BAG)
0.9 % | Freq: Once | INTRAVENOUS | Status: DC
Start: 2019-09-29 — End: 2019-09-29

## 2019-09-29 MED ORDER — SODIUM CHLORIDE 0.9 % BOLUS (NEW BAG)
0.9 % | Freq: Once | INTRAVENOUS | Status: CP
Start: 2019-09-29 — End: ?
  Administered 2019-09-29: 22:00:00 0.9 mL/h via INTRAVENOUS

## 2019-09-29 NOTE — Discharge Instructions
You were seen at [facility] Emergency room on 09/29/2019 for syncope.Based on our assessment of your condition it was determined you most likely have dehydration causing an acute kidney injury.You should increase the amount of water you drink. No adjustments were made to your home medication regimen. You will need to follow up with your primary care doctor TOMORROW to have repeat blood work completed.  Call your doctor's office as soon as possible to schedule an appointment and to let your doctor know that your condition required a visit to the Emergency Room.Return to ED if any of the following develop:- Worsening of your current symptoms- Chest pain and/or shortness of breath- Fever above 100.4 - Vomiting that does not stop- Any other symptoms that you find concerningThere is a follow up office nurse available Monday through Friday 7:00 AM - 3:30 PM to help you with any questions or problems after your Emergency Department discharge. They can be reached at [phone]).Thank you for trusting your care to [place]. It was our privilege to care for you today. Please do not hesitate to return to the Emergency Department should you feel the need.\

## 2019-09-29 NOTE — ED Notes
3:14 PM presents for eval having bilateral ear blockage and dizziness this am with syncope.  Arrives with daughter and caretaker AA&O, see low BP.  Exam PA.  See interventions done. To xray via stretcher. 3:35pm-bedside echo being done by PA.4:40pm-OOB to stand for urinal use with minimal assistance.  Clean catch urine obtained, sent to lab.  Returned to stretcher without incident. Side rails up x2, call light within reach.  Pt now ED obs, pt aware. 5:18pm-meal provided, tolerating well. Daughter at bedside. 6:10pm-additional ivf hung per order.  Will recheck BNP, if improvement will be d/c, per PA.7:07pm-report to [facility].

## 2019-09-29 NOTE — ED Notes
7:00 PM Report received and care assumed1924-Repeat lab sent1955-pt prepared for d/c home

## 2019-09-29 NOTE — ED Notes
Repeat labs after 2L

## 2019-09-29 NOTE — ED Notes
Pt verbalizes understanding of D/C instructions and F/U care

## 2019-09-30 ENCOUNTER — Inpatient Hospital Stay: Admit: 2019-09-30 | Discharge: 2019-09-30 | Payer: PRIVATE HEALTH INSURANCE | Primary: Internal Medicine

## 2019-09-30 DIAGNOSIS — I10 Essential (primary) hypertension: Secondary | ICD-10-CM

## 2019-09-30 DIAGNOSIS — Z87891 Personal history of nicotine dependence: Secondary | ICD-10-CM

## 2019-09-30 DIAGNOSIS — N179 Acute kidney failure, unspecified: Secondary | ICD-10-CM

## 2019-09-30 DIAGNOSIS — H9202 Otalgia, left ear: Secondary | ICD-10-CM

## 2019-09-30 DIAGNOSIS — I714 Abdominal aortic aneurysm, without rupture: Secondary | ICD-10-CM

## 2019-09-30 DIAGNOSIS — Z8249 Family history of ischemic heart disease and other diseases of the circulatory system: Secondary | ICD-10-CM

## 2019-09-30 DIAGNOSIS — I2510 Atherosclerotic heart disease of native coronary artery without angina pectoris: Secondary | ICD-10-CM

## 2019-09-30 DIAGNOSIS — E785 Hyperlipidemia, unspecified: Secondary | ICD-10-CM

## 2019-09-30 LAB — UA REFLEX CULTURE
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
## 2019-10-21 ENCOUNTER — Ambulatory Visit: Admit: 2019-10-21 | Payer: PRIVATE HEALTH INSURANCE | Primary: Internal Medicine

## 2019-10-28 ENCOUNTER — Encounter: Admit: 2019-10-28 | Payer: PRIVATE HEALTH INSURANCE | Attending: Cardiovascular Disease | Primary: Internal Medicine

## 2019-10-28 ENCOUNTER — Inpatient Hospital Stay: Admit: 2019-10-28 | Discharge: 2019-10-28 | Payer: PRIVATE HEALTH INSURANCE | Primary: Internal Medicine

## 2019-10-28 ENCOUNTER — Ambulatory Visit: Admit: 2019-10-28 | Payer: PRIVATE HEALTH INSURANCE | Attending: Cardiovascular Disease | Primary: Internal Medicine

## 2019-10-28 DIAGNOSIS — R0609 Other forms of dyspnea: Secondary | ICD-10-CM

## 2019-10-28 DIAGNOSIS — I10 Essential (primary) hypertension: Secondary | ICD-10-CM

## 2019-10-28 DIAGNOSIS — I2510 Atherosclerotic heart disease of native coronary artery without angina pectoris: Secondary | ICD-10-CM

## 2019-10-28 DIAGNOSIS — E785 Hyperlipidemia, unspecified: Secondary | ICD-10-CM

## 2019-10-28 DIAGNOSIS — R0600 Dyspnea, unspecified: Secondary | ICD-10-CM

## 2019-10-28 DIAGNOSIS — I714 Abdominal aortic aneurysm (AAA) without rupture (HC Code): Secondary | ICD-10-CM

## 2019-10-28 DIAGNOSIS — Z955 Presence of coronary angioplasty implant and graft: Secondary | ICD-10-CM

## 2019-10-28 MED ORDER — LOSARTAN 50 MG TABLET
50 mg | ORAL_TABLET | Freq: Every day | ORAL | 4 refills | Status: AC
Start: 2019-10-28 — End: 2020-07-24

## 2019-10-28 NOTE — Patient Instructions
If you measure your BP, do not want the systolic  To be < 100

## 2019-10-28 NOTE — Progress Notes
[place] [facility] Cardiology Office VisitInterval History: This is a 86 y.o. male with a history of known coronary artery disease status post remote history of an IMI by patient's history, longstanding, well controlled hypertension, status post a left carotid endarterectomy, with a history of a known abdominal aortic aneurysm who returns for a follow up. He was evaluated in the ED on 8/01 for dizziness and probable syncopal episode. In the ED he was hypotensive. Bedside echo was normal. Labs were notable for AKI and he was given fluids which improved levels.  He saw his PCP several days later who reduced the dose of losartan to 50 milligrams daily.  Since that time, the patient has felt well.  He denies any further lightheadedness or syncope.  He denies chest pains.  His dyspnea is stable.Social History: He lives with his wife and daughter. He has 3 daughters. 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 He drinks 2 cups coffee daily, no alcohol. Review of Allergies/Medical History/Medications: Past Medical History: Diagnosis Date ? Chronic coronary artery disease  ? Hyperlipidemia  ? Hypertension  Past Surgical History: Procedure Laterality Date ? APPENDECTOMY   ? BLADDER SURGERY  03/01/2019 ? CHOLECYSTECTOMY   ? FRACTURE SURGERY    rifgt hand Outpatient Medications Marked as Taking for the 10/28/19 encounter (Office Visit) with [name]., MD Medication Sig Dispense Refill ? amLODIPine (NORVASC) 2.5 mg tablet Take 2.5 mg by mouth daily.   ? aspirin 81 mg EC delayed release tablet Take 81 mg by mouth daily.   ? atorvastatin (LIPITOR) 80 mg tablet Take 80 mg by mouth daily.   ? b complex vitamins capsule Take 1 capsule by mouth daily.   ? carvediloL (COREG) 12.5 mg Immediate Release tablet Take 12.5 mg by mouth 2 (two) times daily with breakfast and dinner.   ? clopidogreL (PLAVIX) 75 mg tablet Take 75 mg by mouth daily.   ? gabapentin (NEURONTIN) 100 mg capsule Take 200 mg by mouth daily. hs   ? isosorbide mononitrate (IMDUR) 30 mg 24 hr extended release tablet Take 30 mg by mouth daily.   ? losartan (COZAAR) 50 mg tablet Take 1 tablet (50 mg total) by mouth daily. 90 tablet 3 ? multivitamin (MULTIVITAMIN) tablet Take 1 tablet by mouth daily.   ? nitroGLYCERIN (NITROSTAT) 0.4 mg SL tablet Place 0.4 mg under the tongue every 5 (five) minutes as needed for chest pain. If no relief, call 911. May repeat every 5 minutes for a total of 3 tablets.   ? omeprazole (PRILOSEC) 20 mg capsule Take 20 mg by mouth daily.   ? [DISCONTINUED] losartan (COZAAR) 100 mg tablet Take 100 mg by mouth daily. Pt takes 1/2 tab   Allergies Allergen Reactions ? Ace Inhibitors Cough Review of Systems: Review of Systems Respiratory: Positive for cough.  All other systems reviewed and are negative.Physical Exam: Vitals:  10/28/19 1313 BP: 122/60 Pulse: 67 SpO2: 97% Weight: 95.4 kg Height: 5' 11 (1.803 m) PainSc:   0 - No pain BP 122/60  - Pulse 67  - Ht 5' 11 (1.803 m)  - Wt 95.4 kg  - SpO2 97%  - BMI 29.34 kg/m? Physical ExamReview of Labs/Diagnostics: ECG/Tele Events: Independently interpreted by me:  Normal sinus rhythm no acute ST or T-wave changesEcho Findings: No results found for this or any previous visit.Lab Review: Lab Results Component Value Date  CREATININE 1.89 (H) 09/29/2019  BUN 26 (H) 09/29/2019  NA 138 09/29/2019  K  4.4 09/29/2019  CL 106 09/29/2019  CO2 24 09/29/2019 Lab Results Component Value Date  CHOL 106 07/31/2019  LDL 51 07/31/2019  TRIG 67 07/31/2019  HDL 41 07/31/2019  Lab Results Component Value Date  HGBA1C 5.6 07/24/2019 Recent Studies: noneEncounter Diagnosis:    ICD-10-CM  1. Dyslipidemia  E78.5 EKG (Clinic Performed) 2. Abdominal aortic aneurysm (AAA) without rupture (HC Code)  I71.4  3. Coronary artery disease involving native coronary artery of native heart without angina pectoris  I25.10  4. Essential hypertension  I10  5. History of coronary artery stent placement  Z95.5  Impression and Plan: 86 year old male with known coronary artery disease status post remote history of an IMI by patient's history, longstanding, well controlled hypertension, status post a left carotid endarterectomy, with a history of a known abdominal aortic aneurysm returns for follow-up.  Three weeks ago, the patient experienced a syncopal episode while sitting in his car.  His daughter drove him to the [facility] Emergency Room.  He was found to be orthostatic and dehydrated.  He was advised to keep himself well hydrated.  He saw his PCP several days later who reduce the dose of losartan to 50 milligrams.  Since that time, the patient has felt well without any further episodes.  He denies any angina or congestive symptoms.  He denies palpitations.Today on exam, his blood pressure is only 102/60, he is in sinus rhythm.  There is no congestive heart failure on exam.  His EKG is unchanged.For now, I have not changed his medical regimen.  I have advised the patient to keep himself well hydrated.  If his blood pressure drops below 100 systolic leak, I advised his daughter to contact me at which point I will stop his losartan completely.  I will review the most recent abdominal ultrasound to check on the size of his known abdominal aortic aneurysm.I will plan on seeing the patient in 6 months for follow-up Scribed for [name], MD by Arlan Organ, medical scribe August 30, 2021The documentation recorded by the scribe accurately reflects the services I personally performed and the decisions made by me. I reviewed and confirmed all material entered and/or pre-charted by the scribe.Signed: Charleston Ropes, MD.  For questions [phone]/30/202111:49 AM

## 2019-10-29 NOTE — Other
Call ptTell him aneurysm measured 4.6cm on current studyDifference is due to different technician

## 2020-02-10 ENCOUNTER — Encounter: Admit: 2020-02-10 | Payer: PRIVATE HEALTH INSURANCE | Attending: Cardiovascular Disease | Primary: Internal Medicine

## 2020-02-10 MED ORDER — CARVEDILOL IMMEDIATE RELEASE 12.5 MG TABLET
12.5 mg | ORAL_TABLET | Freq: Two times a day (BID) | ORAL | 4 refills | Status: AC
Start: 2020-02-10 — End: 2021-03-26

## 2020-02-10 NOTE — Telephone Encounter
Via phone call pt needs refill onCarvedilol 12.5mg  #180 QD

## 2020-02-10 NOTE — Telephone Encounter
10/28/19 LOVcarvediloL (COREG) 12.5 mg Immediate Release tablet Take 12.5 mg by mouth 2 (two) times daily with breakfast and dinner. ?

## 2020-04-27 ENCOUNTER — Encounter: Admit: 2020-04-27 | Payer: PRIVATE HEALTH INSURANCE | Attending: Cardiovascular Disease | Primary: Internal Medicine

## 2020-04-27 ENCOUNTER — Ambulatory Visit: Admit: 2020-04-27 | Payer: PRIVATE HEALTH INSURANCE | Attending: Cardiovascular Disease | Primary: Internal Medicine

## 2020-04-27 DIAGNOSIS — I10 Essential (primary) hypertension: Secondary | ICD-10-CM

## 2020-04-27 DIAGNOSIS — E785 Hyperlipidemia, unspecified: Secondary | ICD-10-CM

## 2020-04-27 DIAGNOSIS — I2510 Atherosclerotic heart disease of native coronary artery without angina pectoris: Secondary | ICD-10-CM

## 2020-04-27 DIAGNOSIS — I714 Abdominal aortic aneurysm (AAA) without rupture (HC Code): Secondary | ICD-10-CM

## 2020-04-27 DIAGNOSIS — R0609 Other forms of dyspnea: Secondary | ICD-10-CM

## 2020-04-27 NOTE — Progress Notes
[place] [facility] Cardiology Office VisitInterval History: This is a 87 y.o. male with a history of known coronary artery disease status post remote history of an IMI by patient's history, cardiac stenting, longstanding, well controlled hypertension, s/p left carotid endarterectomy, with a history of a known abdominal aortic aneurysm who returns for a follow up. The patient has received both doses of the COVID-19 vaccine, as well as the booster shot. He is accompanied by his daughter.The patient is compliant with his medications. He denies any chest pain or palpitations. He reports he gets fatigue more easily upon exertion, but denies having any shortness of breath. No orthopnea or PND. He has gained about 13lbs since his last visit. He denies any peripheral edema or claudication. He states he fell in his chair previously, but denies any hospitalization or injuries. No syncope or loss of consciousness. He denies any TIAs. He no longer drives.Social History: He?lives with his wife and daughter.?He?has 3?daughters. 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 He drinks 2 cups coffee daily, no alcohol. Review of Allergies/Medical History/Medications: Past Medical History: Diagnosis Date ? Chronic coronary artery disease  ? Hyperlipidemia  ? Hypertension  Past Surgical History: Procedure Laterality Date ? APPENDECTOMY   ? BLADDER SURGERY  03/01/2019 ? CHOLECYSTECTOMY   ? FRACTURE SURGERY    rifgt hand Outpatient Medications Marked as Taking for the 04/27/20 encounter (Office Visit) with [name]., MD Medication Sig Dispense Refill ? amLODIPine (NORVASC) 2.5 mg tablet Take 2.5 mg by mouth daily.   ? aspirin 81 mg EC delayed release tablet Take 81 mg by mouth daily.   ? atorvastatin (LIPITOR) 80 mg tablet Take 80 mg by mouth daily.   ? b complex vitamins capsule Take 1 capsule by mouth daily.   ? carvediloL (COREG) 12.5 mg Immediate Release tablet Take 1 tablet (12.5 mg total) by mouth 2 (two) times daily with breakfast and dinner. 180 tablet 3 ? clopidogreL (PLAVIX) 75 mg tablet Take 75 mg by mouth daily.   ? gabapentin (NEURONTIN) 100 mg capsule Take 200 mg by mouth daily. hs   ? isosorbide mononitrate (IMDUR) 30 mg 24 hr extended release tablet Take 30 mg by mouth daily.   ? losartan (COZAAR) 50 mg tablet Take 1 tablet (50 mg total) by mouth daily. 90 tablet 3 ? multivitamin (MULTIVITAMIN) tablet Take 1 tablet by mouth daily.   ? nitroGLYCERIN (NITROSTAT) 0.4 mg SL tablet Place 0.4 mg under the tongue every 5 (five) minutes as needed for chest pain. If no relief, call 911. May repeat every 5 minutes for a total of 3 tablets.   ? omeprazole (PRILOSEC) 20 mg capsule Take 20 mg by mouth daily.   Allergies Allergen Reactions ? Ace Inhibitors Cough Review of Systems: Review of Systems Constitutional:      HPI HENT: Negative.  Eyes: Negative.  Respiratory: Negative.  Cardiovascular: Negative.  Gastrointestinal: Negative.  Genitourinary: Negative.  Musculoskeletal:      HPI Skin: Negative.  Neurological: Negative.  Endo/Heme/Allergies: Negative.  Psychiatric/Behavioral: Negative.  Physical Exam: Vitals:  04/27/20 1317 04/27/20 1332 BP: (!) 140/70 120/62 Site: Left Arm  Position: Sitting Sitting Cuff Size: Large  Pulse: 67  Weight: 102.5 kg  PainSc:   0 - No pain  BP 120/62 (Position: Sitting)  - Pulse 67  - Wt 102.5 kg  - BMI 31.52 kg/m?  Physical ExamConstitutional:     General: He is not in acute distress.   Appearance: He is  well-developed. Neck:    Vascular: Normal carotid pulses. No carotid bruit, hepatojugular reflux or JVD. Cardiovascular:    Rate and Rhythm: Normal rate and regular rhythm.    Heart sounds: Murmur (SEM along LSB) heard. Systolic murmur is present with a grade of 1/6. No friction rub.    Comments: S/p left carotid endarterectomyPulmonary:    Effort: Pulmonary effort is normal.    Breath sounds: Normal breath sounds. No wheezing or rales. Abdominal:    General: Bowel sounds are normal.    Palpations: Abdomen is soft.    Tenderness: There is no abdominal tenderness. There is no rebound. Musculoskeletal:       General: Normal range of motion.    Cervical back: Normal range of motion and neck supple.    Right lower leg: No edema.    Left lower leg: No edema. Skin:   General: Skin is warm and dry. Neurological:    Mental Status: He is alert and oriented to person, place, and time. Psychiatric:       Behavior: Behavior normal. Review of Labs/Diagnostics: ECG/Tele Events: Independently interpreted by me:  Normal sinus rhythm, left axis deviation, no acute ST or T-wave changesEcho Findings: No results found for this or any previous visit.Stress Test: No results found for this or any previous visit.Lab Review:  Ref Range & Units 09/30/19 12:34 PM Glucose External 65 - 139 mg/dL 098  Comment:  ?? ? ? Non-fasting reference interval Blood Urea Nitrogen (BUN) External 7 - 25 mg/dL 22  Creatinine External 0.70 - 1.11 mg/dL 1.19?High?  Comment: For patients >90 years of age, the reference limit for Creatinine is approximately 13% higher for people identified as African-American. eGFR Non-African American > OR = 60 mL/min/1.14m2 36?Low?  eGFR African American > OR = 60 mL/min/1.23m2 41?Low?  BUN/Creatinine Ratio 6 - 22 (calc) 13  Sodium External 135 - 146 mmol/L 142  Potassium External 3.5 - 5.3 mmol/L 3.9  Chloride External 98 - 110 mmol/L 109  CO2 External 20 - 32 mmol/L 25  Calcium External 8.6 - 10.3 mg/dL 8.8  Lab Results Component Value Date  CREATININE 1.89 (H) 09/29/2019  BUN 26 (H) 09/29/2019  NA 138 09/29/2019  K 4.4 09/29/2019  CL 106 09/29/2019  CO2 24 09/29/2019 Lab Results Component Value Date  CHOL 106 07/31/2019  LDL 51 07/31/2019  TRIG 67 07/31/2019  HDL 41 07/31/2019  Lab Results Component Value Date  HGBA1C 5.6 07/24/2019 Recent Studies:  US Duplex Aorta 10/28/19* No prior study available for comparison.* There is a distal abdominal aortic aneurysm with a maximal diameter of  4.6cm x 4.7cm. Flow was normal in the aorta. There is mild heterogeneous thrombus seen in the aneurysm.* There is no evidence of significant atherosclerotic plaques in the common iliac arteries or external iliac arteries----------------------------------------------------------------------------------------------------------------------XR CHEST PA AND LATERAL 08/01/21INDICATION: SyncopeCOMPARISON: None?FINDINGS:   ?Calcific atherosclerosis of the aortic knob is noted.?The cardiomediastinal silhouette is normal.?The lungs are clear. The pleural spaces are clear.?There is no acute osseous injury.?IMPRESSION:?No acute cardiothoracic abnormality.?Report Initiated By:  Vincenza Hews, MD?Reported And Signed By: Sharyne Peach, MDEncounter Diagnosis:    ICD-10-CM  1. Essential hypertension  I10 EKG (Clinic Performed) 2. DOE (dyspnea on exertion)  R06.00  3. Coronary artery disease involving coronary bypass graft of native heart without angina pectoris  I25.810  4. Abdominal aortic aneurysm (AAA) without rupture (HC Code) (HC CODE)  I71.4  Impression and Plan: 87 year old male with known coronary artery disease status post remote history of an IMI by  patient's history, longstanding, well controlled hypertension, status post a left carotid endarterectomy, with a history of a known abdominal aortic aneurysm returns for follow-up.   over the past 6 months, the patient has been stable from a cardiovascular standpoint without chest pain, syncope or palpitations.  He does admit to progressive dyspnea on exertion and unfortunately, he has gained over 10 pounds.Taken today on exam, his blood pressure is well controlled, he is in sinus rhythm.  The rest his cardiovascular exam is unremarkable.  His resting EKG is unchanged.I reassured the patient that he is stable from cardiovascular standpoint.  There is no need to change medications.  I encouraged the patient to lose weight and increase his activity.  I have asked the patient return in 6 months for follow-up  Scribed for [name], MD by Dola Argyle, medical scribe April 27, 2020  The documentation recorded by the scribe accurately reflects the services I personally performed and the decisions made by me. I reviewed and confirmed all material entered and/or pre-charted by the scribe. Signed: Charleston Ropes, MD.  For questions [phone]/28/202210:35 PM

## 2020-04-28 DIAGNOSIS — I25810 Atherosclerosis of coronary artery bypass graft(s) without angina pectoris: Secondary | ICD-10-CM

## 2020-04-28 DIAGNOSIS — I714 Abdominal aortic aneurysm, without rupture: Secondary | ICD-10-CM

## 2020-04-28 DIAGNOSIS — R0600 Dyspnea, unspecified: Secondary | ICD-10-CM

## 2020-07-10 ENCOUNTER — Telehealth: Admit: 2020-07-10 | Payer: PRIVATE HEALTH INSURANCE | Primary: Internal Medicine

## 2020-07-10 NOTE — Telephone Encounter
Received  A message from daughter [name] [phone]-   States her dad is having cataract  surgery and the surgeron is asking if surgery should happen.   Called Dr. Seth Bake [phone] at eye center and spoke to [place] with ext [phone]-  Stated that they want the recommendation of pt cardiologist - based on pt cardiac history - if it would be ok for pt to have cataract surgery in an outpt setting vs. Hospital setting.Notified I would discuss with Dr. Theo Dills

## 2020-07-13 NOTE — Telephone Encounter
I would need to see the pt in person to give recommendation

## 2020-07-14 ENCOUNTER — Telehealth: Admit: 2020-07-14 | Payer: PRIVATE HEALTH INSURANCE | Attending: Cardiovascular Disease | Primary: Internal Medicine

## 2020-07-14 NOTE — Telephone Encounter
Will schedule an appt

## 2020-07-14 NOTE — Telephone Encounter
Please assist with MD request Thank you [name]., MD  [facility] Nurse Triage Pool 27 minutes ago (4:51 PM) [facility] pt see an aprn shortly  Message text

## 2020-07-14 NOTE — Telephone Encounter
[name], daughter calledPt just had a home physical therapy session and was noted to have exertional dyspneaSTates pt was out of breath walking to the mailbox and backDenies leg edemaDenies abdominal distentionDenies chest painPt does not weigh self daily but last approximate weight was 220 lbs (weight 225.5 lbs at last OV in February)BP today 120/68, HR 78, SPO2 98%States pt may have had dietary sodium indiscretion May have been eating deli meats, crackers while she was awayStates pt drinks a lot because otherwise he gets dizzyPt does endorse dizziness when he turns to his left side in bed, does not get dizzy with standingStates pt is off balanceAlso notes pt needs cataract surgery and states he needs cardiac clearance to have it done at [facility] vs hospitalI see that Dr. Frutoso Chase recommended an OV in order to make a recommendation regarding where to have cataract surgeryPlease advise regarding DOEPt not on diuretics? Urgent OV

## 2020-07-16 ENCOUNTER — Inpatient Hospital Stay: Admit: 2020-07-16 | Discharge: 2020-07-16 | Payer: PRIVATE HEALTH INSURANCE | Attending: Emergency Medicine

## 2020-07-16 ENCOUNTER — Emergency Department: Admit: 2020-07-16 | Payer: PRIVATE HEALTH INSURANCE | Primary: Internal Medicine

## 2020-07-16 ENCOUNTER — Emergency Department: Admit: 2020-07-16 | Payer: PRIVATE HEALTH INSURANCE | Attending: Diagnostic Radiology | Primary: Internal Medicine

## 2020-07-16 DIAGNOSIS — Z20822 Contact with and (suspected) exposure to covid-19: Secondary | ICD-10-CM

## 2020-07-16 DIAGNOSIS — I10 Essential (primary) hypertension: Secondary | ICD-10-CM

## 2020-07-16 DIAGNOSIS — R55 Syncope and collapse: Secondary | ICD-10-CM

## 2020-07-16 DIAGNOSIS — Z888 Allergy status to other drugs, medicaments and biological substances status: Secondary | ICD-10-CM

## 2020-07-16 DIAGNOSIS — I2510 Atherosclerotic heart disease of native coronary artery without angina pectoris: Secondary | ICD-10-CM

## 2020-07-16 DIAGNOSIS — Z79899 Other long term (current) drug therapy: Secondary | ICD-10-CM

## 2020-07-16 DIAGNOSIS — Z7982 Long term (current) use of aspirin: Secondary | ICD-10-CM

## 2020-07-16 DIAGNOSIS — Z7902 Long term (current) use of antithrombotics/antiplatelets: Secondary | ICD-10-CM

## 2020-07-16 DIAGNOSIS — Z8679 Personal history of other diseases of the circulatory system: Secondary | ICD-10-CM

## 2020-07-16 DIAGNOSIS — Z87891 Personal history of nicotine dependence: Secondary | ICD-10-CM

## 2020-07-16 DIAGNOSIS — E785 Hyperlipidemia, unspecified: Secondary | ICD-10-CM

## 2020-07-16 DIAGNOSIS — R109 Unspecified abdominal pain: Secondary | ICD-10-CM

## 2020-07-16 DIAGNOSIS — R61 Generalized hyperhidrosis: Secondary | ICD-10-CM

## 2020-07-16 LAB — URINALYSIS WITH CULTURE REFLEX      (BH LMW YH)
BKR BILIRUBIN, UA: NEGATIVE
BKR BLOOD, UA: NEGATIVE
BKR KETONES, UA: NEGATIVE
BKR LEUKOCYTE ESTERASE, UA: NEGATIVE
BKR NITRITE, UA: NEGATIVE
BKR PH, UA: 6 (ref 5.5–7.5)
BKR SPECIFIC GRAVITY, UA: 1.05 — ABNORMAL HIGH (ref 1.005–1.030)
BKR UROBILINOGEN, UA: <2 EU/dL (ref <=2.0)

## 2020-07-16 LAB — HEPATIC FUNCTION PANEL
BKR A/G RATIO: 1.6 (ref 1.0–2.2)
BKR ALANINE AMINOTRANSFERASE (ALT): 22 U/L (ref 9–59)
BKR ALBUMIN: 4.4 g/dL (ref 3.6–4.9)
BKR ALKALINE PHOSPHATASE: 75 U/L (ref 9–122)
BKR ASPARTATE AMINOTRANSFERASE (AST): 24 U/L (ref 10–35)
BKR AST/ALT RATIO: 1.1
BKR BILIRUBIN DIRECT: 0.3 mg/dL (ref <=0.3)
BKR BILIRUBIN TOTAL: 1.4 mg/dL — ABNORMAL HIGH (ref <=1.2)
BKR GLOBULIN: 2.7 g/dL (ref 2.3–3.5)
BKR PROTEIN TOTAL: 7.1 g/dL (ref 6.6–8.7)

## 2020-07-16 LAB — CBC WITH AUTO DIFFERENTIAL
BKR WAM ABSOLUTE IMMATURE GRANULOCYTES.: 0.02 x 1000/ÂµL (ref 0.00–0.30)
BKR WAM ABSOLUTE LYMPHOCYTE COUNT.: 1.06 x 1000/ÂµL (ref 0.60–3.70)
BKR WAM ABSOLUTE NRBC (2 DEC): 0 x 1000/ÂµL (ref 0.00–1.00)
BKR WAM ANALYZER ANC: 3.43 x 1000/ÂµL (ref 2.00–7.60)
BKR WAM BASOPHIL ABSOLUTE COUNT.: 0.03 x 1000/ÂµL (ref 0.00–1.00)
BKR WAM BASOPHILS: 0.6 % (ref 0.0–1.4)
BKR WAM EOSINOPHIL ABSOLUTE COUNT.: 0.22 x 1000/ÂµL (ref 0.00–1.00)
BKR WAM EOSINOPHILS: 4.2 % (ref 0.0–5.0)
BKR WAM HEMATOCRIT (2 DEC): 41.7 % (ref 38.50–50.00)
BKR WAM HEMOGLOBIN: 13.6 g/dL (ref 13.2–17.1)
BKR WAM IMMATURE GRANULOCYTES: 0.4 % (ref 0.0–1.0)
BKR WAM LYMPHOCYTES: 20.3 % (ref 17.0–50.0)
BKR WAM MCH (PG): 23.8 pg — ABNORMAL LOW (ref 27.0–33.0)
BKR WAM MCHC: 32.6 g/dL (ref 31.0–36.0)
BKR WAM MCV: 72.9 fL — ABNORMAL LOW (ref 80.0–100.0)
BKR WAM MONOCYTE ABSOLUTE COUNT.: 0.45 x 1000/ÂµL (ref 0.00–1.00)
BKR WAM MONOCYTES: 8.6 % (ref 4.0–12.0)
BKR WAM MPV: 10.8 fL (ref 8.0–12.0)
BKR WAM NEUTROPHILS: 65.9 % (ref 39.0–72.0)
BKR WAM NUCLEATED RED BLOOD CELLS: 0 % (ref 0.0–1.0)
BKR WAM PLATELETS: 165 x1000/ÂµL (ref 150–420)
BKR WAM RDW-CV: 15.9 % — ABNORMAL HIGH (ref 11.0–15.0)
BKR WAM RED BLOOD CELL COUNT.: 5.72 M/ÂµL (ref 4.00–6.00)
BKR WAM WHITE BLOOD CELL COUNT: 5.2 x1000/ÂµL (ref 4.0–11.0)

## 2020-07-16 LAB — BASIC METABOLIC PANEL
BKR ANION GAP: 14 (ref 7–17)
BKR BLOOD UREA NITROGEN: 38 mg/dL — ABNORMAL HIGH (ref 8–23)
BKR BUN / CREAT RATIO: 19 (ref 8.0–23.0)
BKR CALCIUM: 9.5 mg/dL (ref 8.8–10.2)
BKR CHLORIDE: 103 mmol/L (ref 98–107)
BKR CO2: 22 mmol/L (ref 20–30)
BKR CREATININE: 2 mg/dL — ABNORMAL HIGH (ref 0.40–1.30)
BKR EGFR (AFR AMER): 38 mL/min/{1.73_m2} (ref >60)
BKR EGFR (NON AFRICAN AMERICAN): 31 mL/min/{1.73_m2} (ref >60)
BKR GLUCOSE: 172 mg/dL — ABNORMAL HIGH (ref 70–100)
BKR POTASSIUM: 4.9 mmol/L (ref 3.3–5.3)
BKR SODIUM: 139 mmol/L (ref 136–144)

## 2020-07-16 LAB — PROTIME AND INR
BKR INR: 1.09 (ref 0.87–1.14)
BKR PROTHROMBIN TIME: 11.8 s (ref 9.6–12.3)

## 2020-07-16 LAB — TROPONIN T HIGH SENSITIVITY, 0 HOUR BASELINE WITH REFLEX (BH GH LMW YH): BKR TROPONIN T HS 0 HOUR BASELINE: 39 ng/L — ABNORMAL HIGH

## 2020-07-16 LAB — TROPONIN T HIGH SENSITIVITY, 3 HOUR (BH GH LMW YH)
BKR TROPONIN T HS 1 HOUR DELTA FROM 0 HOUR ON 3HR: -3 ng/L
BKR TROPONIN T HS 3 HOUR DELTA FROM 0 HOUR: -9 ng/L
BKR TROPONIN T HS 3 HOUR: 30 ng/L — ABNORMAL HIGH

## 2020-07-16 LAB — LIPASE: BKR LIPASE: 28 U/L (ref 11–55)

## 2020-07-16 LAB — SARS COV-2 (COVID-19) RNA: BKR SARS-COV-2 RNA (COVID-19) (YH): NEGATIVE

## 2020-07-16 LAB — TROPONIN T HIGH SENSITIVITY, 1 HOUR WITH REFLEX (BH GH LMW YH)
BKR TROPONIN T HS 1 HOUR DELTA FROM 0 HOUR: -3 ng/L
BKR TROPONIN T HS 1 HOUR: 36 ng/L — ABNORMAL HIGH

## 2020-07-16 LAB — MAGNESIUM: BKR MAGNESIUM: 2.2 mg/dL (ref 1.7–2.4)

## 2020-07-16 MED ORDER — SODIUM CHLORIDE 0.9 % LARGE VOLUME SYRINGE FOR AUTOINJECTOR
Freq: Once | INTRAVENOUS | Status: CP | PRN
Start: 2020-07-16 — End: ?
  Administered 2020-07-16: 21:00:00 via INTRAVENOUS

## 2020-07-16 MED ORDER — IOHEXOL 350 MG IODINE/ML INTRAVENOUS SOLUTION
350 mg iodine/mL | Freq: Once | INTRAVENOUS | Status: CP | PRN
Start: 2020-07-16 — End: ?
  Administered 2020-07-16: 21:00:00 350 mL via INTRAVENOUS

## 2020-07-16 MED ORDER — SODIUM CHLORIDE 0.9 % BOLUS (NEW BAG)
0.9 % | Freq: Once | INTRAVENOUS | Status: CP
Start: 2020-07-16 — End: ?
  Administered 2020-07-16: 19:00:00 0.9 mL/h via INTRAVENOUS

## 2020-07-16 NOTE — ED Notes
2:52 PM 87 yo male brought in via EMS from home for near syncopal event on toilet. Pt states that he had increased weakness and light headed. No LOC. +diaphoretic. Hx of AAA. Pt resting on stretcher, on full cardiac monitoring, VSS, WCTM Past Medical History: Diagnosis Date ? Chronic coronary artery disease  ? Hyperlipidemia  ? Hypertension   4:04 PM pt awaiting [place], daughter [name] updated on POC6:12 PM pt ambulating with steady gait with walker and EDTA with pt7:08 PM daughter called and updated, will come pick pt and wife up. PIV removed by EDTA7:13 PM Bedside report given to oncoming shift RN, care deferred

## 2020-07-16 NOTE — Discharge Instructions
You were evaluated in the emergency room for faintness. Your evaluation did not show signs of a condition requiring further evaluation in the hospital today. Your [place] scan showed possible thickening of your bladder wall and you should follow up with your primary care provider for further evaluation and referral to urology. Return to the emergency department with any faintness, dizziness, chest pain, abdominal pain, or any other symptoms that are concerning to you.

## 2020-07-16 NOTE — ED Notes
7:17 PM Report received from [facility]. Pt and wife awaiting discharge ride.

## 2020-07-17 LAB — UA REFLEX CULTURE
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
## 2020-07-21 ENCOUNTER — Telehealth: Admit: 2020-07-21 | Payer: PRIVATE HEALTH INSURANCE | Attending: Cardiovascular Disease | Primary: Internal Medicine

## 2020-07-21 NOTE — Telephone Encounter
Spoke to daughter Mark [name]. Patient saw MD at PCP office today- they are holding amlodipine and losartan x3 days for low BP (see details below).They wanted to make sure that was ok with cardiologist. Instructed daughter to follow instructions from MD today.BP recheck at home was 110/58. Patient has OV with JO this Friday 5/27. Instructed to monitor BP's daily and bring in log to OV. Mark Poet, MD - 07/21/2020 2:26 PM EDTFormatting of this note is different from the original.Assessment & Plan 1. Near syncope2. Hypotension, unspecified hypotension typeIn ER very mild troponin elevation was stable on recheck x3, has appt with his cardiologist in 3 daysNo signs of infection and WBC normal, no signs of heart failureAdvised to push fluidsHold amlodipine 2.5 mg and losartan 100 mg next 3 days and discuss further with cardiologistOn recheck BP 88/55, patient was given bottle of water and on repeat BP 95/55He will continue to push fluids

## 2020-07-22 NOTE — Telephone Encounter
Spoke w/ ptMD message relayedHe will bring BP #'s w/ him to the OV on 5/27Nadelmann, Jaye Beagle., MD  [facility] Nurse Triage Pool Just now (10:13 AM) JNI'm OK with this  Message text

## 2020-07-24 ENCOUNTER — Telehealth: Admit: 2020-07-24 | Payer: PRIVATE HEALTH INSURANCE | Primary: Internal Medicine

## 2020-07-24 ENCOUNTER — Ambulatory Visit: Admit: 2020-07-24 | Payer: PRIVATE HEALTH INSURANCE | Attending: Family | Primary: Internal Medicine

## 2020-07-24 ENCOUNTER — Encounter: Admit: 2020-07-24 | Payer: PRIVATE HEALTH INSURANCE | Attending: Family | Primary: Internal Medicine

## 2020-07-24 DIAGNOSIS — I10 Essential (primary) hypertension: Secondary | ICD-10-CM

## 2020-07-24 DIAGNOSIS — I714 AAA (abdominal aortic aneurysm) without rupture (HC Code): Secondary | ICD-10-CM

## 2020-07-24 DIAGNOSIS — I2510 Atherosclerotic heart disease of native coronary artery without angina pectoris: Secondary | ICD-10-CM

## 2020-07-24 DIAGNOSIS — R55 Syncope and collapse: Secondary | ICD-10-CM

## 2020-07-24 DIAGNOSIS — E785 Hyperlipidemia, unspecified: Secondary | ICD-10-CM

## 2020-07-24 MED ORDER — AMLODIPINE 2.5 MG TABLET
2.5 mg | ORAL_TABLET | Freq: Every day | ORAL | 4 refills | Status: AC
Start: 2020-07-24 — End: ?

## 2020-07-24 MED ORDER — LOSARTAN 50 MG TABLET
50 mg | ORAL_TABLET | Freq: Every day | ORAL | 4 refills | Status: AC
Start: 2020-07-24 — End: 2022-01-07

## 2020-07-24 NOTE — Patient Instructions
-   restart Amlodipine 2.5 mg daily- restart Losartan 50 mg daily- check blood pressures at home- hydration- okay to proceed with cataract surgery

## 2020-07-24 NOTE — Progress Notes
 [facility] & Vascular CenterCardiovascular DiseaseProgress Mark [name]  [facility] Mark [name] is a 87 y.o. male followed in cardiology by Dr. Frutoso Chase with PMHx of CAD s/p remote history of an IMI by patient's history, HTN, HLD, s/p left carotid endarterectomy, known abdominal aortic aneurysm measuring 4.5 cm with partially thrombosed on [place] scan of abdomen pelvis in 2021.He presented to he ER 07/16/20 with abdominal pain, diaphoresis and near syncope while sitting on the toilet. Trop 39, 36, EKG revealed no ischemic changes. CTA of abdomen and pelvis revealed no significant interval changes in 4.5 cm infrarenal abdominal aortic aneurysm without findings for acute rupture. Event was thought to be a vasovagal in nature since workup was unrevealing. He was seen by his PCP a few days ago who stopped his amlodipine 2.5 mg daily and Losartan 50 mg daily for a blood pressure reading of 88/60 in the office. Mark [name] presents to my office for a hospital follow-up. Patient reports feeling lowsy after stopping his amlodipine and losartan. He has been checking his blood pressures at home and has been getting elevated readings in the 150/80 yesterday. He reports headaches and bounding pulse. He tries to stay hydrated and drinks about a half gallon of water a day. He denies any chest pain. He does have ongoing SOB. Denies further syncope, lightheadedness, orthopnea, PND, palpitations. Problem list:Patient Active Problem List  Diagnosis Date Noted ? Allergic rhinitis 07/24/2019 ? Chronic sinusitis 07/24/2019 ? Essential hypertension 07/24/2019 ? Essential hypertension 07/24/2019 ? Essential hypertension 07/24/2019 ? Gastroesophageal reflux disease 07/24/2019 ? Gastroesophageal reflux disease 07/24/2019 ? Iron deficiency anemia 07/24/2019 ? Low back pain 07/24/2019 ? Mixed hyperlipidemia 07/24/2019 ? Other thalassemia (HC Code) (HC CODE) (HC Code) 07/24/2019   Sep 10, 2003 Entered By: Glenford Peers Comment: Beta thalassemia minor ? Acute cholecystitis 03/11/2019 ? Cholecystitis 03/10/2019 ? Bilateral hearing loss 01/28/2019 ? Impaired fasting glucose 08/02/2016 ? Carotid artery stenosis 06/07/2016 ? History of coronary artery stent placement 06/07/2016 ? Vertigo 07/10/2015 ? AAA (abdominal aortic aneurysm) (HC Code) 05/05/2015   Formatting of this note might be different from the original.2016 3.6 cm [place] in [place], 2018 4 cm, no further f/u recommended by Vasc ? CAD (coronary artery disease) 05/05/2015 ? Dyslipidemia 05/05/2015 ? Gastroesophageal reflux disease without esophagitis 05/05/2015 ? Hypertensive kidney disease with stage 3 chronic kidney disease (HC Code) (HC CODE) (HC Code) 05/05/2015 ? 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 Spinal stenosis 05/05/2015 Medications:Current Outpatient Medications Medication Sig Dispense Refill ? amLODIPine (NORVASC) 2.5 mg tablet Take 2.5 mg by mouth daily.   ? aspirin 81 mg EC delayed release tablet Take 81 mg by mouth daily.   ? atorvastatin (LIPITOR) 80 mg tablet Take 80 mg by mouth daily.   ? b complex vitamins capsule Take 1 capsule by mouth daily.   ? carvediloL (COREG) 12.5 mg Immediate Release tablet Take 1 tablet (12.5 mg total) by mouth 2 (two) times daily with breakfast and dinner. 180 tablet 3 ? clopidogreL (PLAVIX) 75 mg tablet Take 75 mg by mouth daily.   ? gabapentin (NEURONTIN) 100 mg capsule Take 200 mg by mouth daily. hs   ? isosorbide mononitrate (IMDUR) 30 mg 24 hr extended release tablet Take 30 mg by mouth daily.   ? losartan (COZAAR) 50 mg tablet Take 1 tablet (50 mg total) by mouth daily. 90 tablet 3 ? multivitamin (MULTIVITAMIN) tablet Take 1 tablet by mouth daily.   ? nitroGLYCERIN (NITROSTAT) 0.4 mg SL tablet Place 0.4 mg under the tongue every 5 (five) minutes as needed  for chest pain. If no relief, call 911. May repeat every 5 minutes for a total of 3 tablets.   ? omeprazole (PRILOSEC) 20 mg capsule Take 20 mg by mouth daily.   No current facility-administered medications for this visit.  Allergies:He is allergic to ace inhibitors.Review of Systems Constitutional: Negative for malaise/fatigue. Cardiovascular: Positive for syncope (x1 episode ). Negative for chest pain, claudication, cyanosis, dyspnea on exertion, irregular heartbeat, leg swelling, near-syncope, orthopnea, palpitations and paroxysmal nocturnal dyspnea. Respiratory: Positive for shortness of breath.  Gastrointestinal: Negative for bloating, nausea and vomiting. Neurological: Positive for headaches.  Vital Signs: BP 130/78 (Site: r a, Position: Sitting)  - Pulse 68  - Ht 5' 11 (1.803 m)  - Wt 102.6 kg  - SpO2 99%  - BMI 31.55 kg/m?  Wt Readings from Last 3 Encounters: 04/27/20 102.5 kg 10/28/19 95.4 kg 09/29/19 95.3 kg BP Readings from Last 3 Encounters: 07/16/20 (!) 158/71 04/27/20 120/62 10/28/19 122/60  Pulse Readings from Last 3 Encounters: 07/16/20 (!) 57 04/27/20 67 10/28/19 67 Physical ExamGEN: Pleasant, alert, in no apparent distress.HEENT: Anicteric. NECK:  no carotid bruit.CV: Regular rate and rhythm.  Clear S1 and S2 without murmurs, rubs, or gallops.LUNG: Lungs are clear to auscultation bilaterally.  No wheezes/rales/rhonchi. EXTREM: No lower extremity edema, 2+ DP & PT pulses bilaterally. Warm distal extremities.NEURO: Mood and affect appropriate.  A/o x 4.Labs:Lipid Panel:Lab Results Component Value Date  CHOL 106 07/31/2019  TRIG 67 07/31/2019  HDL 41 07/31/2019  LDL 51 16/11/9602 Chemistry Lab Results Component Value Date  NA 139 07/16/2020  K 4.9 07/16/2020  CL 103 07/16/2020  CO2 22 07/16/2020  BUN 38 (H) 07/16/2020  CREATININE 2.00 (H) 07/16/2020  PHOS 2.9 03/10/2019  CALCIUM 9.5 07/16/2020  Lab Results Component Value Date  ALT 22 07/16/2020  AST 24 07/16/2020  ALKPHOS 75 07/16/2020  BILITOT 1.4 (H) 07/16/2020  EKG: 05/27/22sinus rhythm HR 63 BPM, borderline left axis deviation low voltage in the precordial leads, probable anteroseptal infarctCTA ABDOMEN AND PELVIS 5/19/2022IMPRESSION:?1.         No significant interval change in the 4.5 cm infrarenal abdominal aortic aneurysm without findings for acute rupture.?2.         Urinary bladder underdistention limits evaluation. Right lateral aspect of the urinary bladder wall demonstrates a region with slightly increased wall thickening and indented appearance; uncertain whether this is transient; urologic follow-up recommended to exclude underlying lesion.ASSESSMENT:In summary, Mark [name] is a 87 y.o. male with a past medical history of CAD s/p remote history of an IMI by patient's history, HTN, HLD, s/p left carotid endarterectomy, known abdominal aortic aneurysm measuring 4.5 cm who comes in today after an ER visit following a vasovagal event. His amlodipine and losartan were held by PCP due to 1 episode of hypotension and told to follow up with cardiology. His blood pressures have been elevated at home since stopping his medication with acute symptoms of headaches and bounding pulses. He has no further episodes of syncope. He is without anginal symptoms and EKG shows no acute changes. Due to his history of his infrarenal abdominal aortic aneurysm his blood pressure should be well controlled with a goal < 120/80PLAN:- restart Amlodipine 2.5 mg daily and Losartan 50 mg daily as prior.- continue to monitor BP at home- increase hydration It was a pleasure to participate in the care of this patient.Patient will return to my office in 4 weeks for a blood pressure evaluation, but is instructed to call me if any issues arise between  now and then.Electronically Signed by Rexene Agent, NP, Jul 24, 2020

## 2020-07-24 NOTE — Telephone Encounter
Spoke to patient and scheduled a NP appointment with Mark [name] for thickening of the bladder. Appointment scheduled in [place] on 09/07/20 @ 2pm

## 2020-07-24 NOTE — Telephone Encounter
[facility] MESSAGETime of call:   2:44 PMCaller:   Ferdinand Lango relationship to patient:  Daughter   Calling from [employer], hospital, agency, etc.): n/a   Reason for call:   [name] called to verify if patient needs referral to schedule appt. She advised that patient was in the hospital and was told he needed to f/u with a Urologist. If not feeling well, what are symptoms:  n/a   If having symptoms, how long have the symptoms been present:  n/a   Does caller request to speak to someone urgently?  no   Best telephone number for callback:   [phone] Best time to return call:   Anytime  Permission to leave message:  yes   Daniels Morrison [facility]

## 2020-07-25 DIAGNOSIS — I714 Abdominal aortic aneurysm, without rupture: Secondary | ICD-10-CM

## 2020-07-25 DIAGNOSIS — I252 Old myocardial infarction: Secondary | ICD-10-CM

## 2020-07-25 DIAGNOSIS — E782 Mixed hyperlipidemia: Secondary | ICD-10-CM

## 2020-08-21 ENCOUNTER — Ambulatory Visit: Admit: 2020-08-21 | Payer: PRIVATE HEALTH INSURANCE | Attending: Family | Primary: Internal Medicine

## 2020-09-07 ENCOUNTER — Ambulatory Visit: Admit: 2020-09-07 | Payer: PRIVATE HEALTH INSURANCE | Attending: Urology | Primary: Internal Medicine

## 2020-09-07 ENCOUNTER — Encounter: Admit: 2020-09-07 | Payer: PRIVATE HEALTH INSURANCE | Attending: Urology | Primary: Internal Medicine

## 2020-09-07 DIAGNOSIS — E785 Hyperlipidemia, unspecified: Secondary | ICD-10-CM

## 2020-09-07 DIAGNOSIS — I10 Essential (primary) hypertension: Secondary | ICD-10-CM

## 2020-09-07 DIAGNOSIS — N3289 Other specified disorders of bladder: Secondary | ICD-10-CM

## 2020-09-07 DIAGNOSIS — I2510 Atherosclerotic heart disease of native coronary artery without angina pectoris: Secondary | ICD-10-CM

## 2020-09-07 MED ORDER — IPRATROPIUM BROMIDE 21 MCG (0.03 %) NASAL SPRAY
21 mcg (0.03 %) | Status: AC
Start: 2020-09-07 — End: ?
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
## 2020-09-14 ENCOUNTER — Telehealth: Admit: 2020-09-14 | Payer: PRIVATE HEALTH INSURANCE | Attending: Urology | Primary: Internal Medicine

## 2020-09-14 NOTE — Telephone Encounter
Urine Cytology = atypical cells.  Scheduled for cystoscopy 10/02/20.URINE, VOIDED: ? ? ?- SPECIMEN IS ADEQUATE FOR INTERPRETATION. - ATYPICAL UROTHELIAL CELLS PRESENT. - ARCHITECTURAL ATYPIA WITH MINIMAL CYTOLOGIC ATYPIA.

## 2020-09-28 ENCOUNTER — Encounter: Admit: 2020-09-28 | Payer: PRIVATE HEALTH INSURANCE | Attending: Family | Primary: Internal Medicine

## 2020-09-28 ENCOUNTER — Ambulatory Visit: Admit: 2020-09-28 | Payer: PRIVATE HEALTH INSURANCE | Attending: Family | Primary: Internal Medicine

## 2020-09-28 DIAGNOSIS — E785 Hyperlipidemia, unspecified: Secondary | ICD-10-CM

## 2020-09-28 DIAGNOSIS — I714 Abdominal aortic aneurysm (AAA) without rupture (HC Code): Secondary | ICD-10-CM

## 2020-09-28 DIAGNOSIS — Z01810 Encounter for preprocedural cardiovascular examination: Secondary | ICD-10-CM

## 2020-09-28 DIAGNOSIS — I2510 Atherosclerotic heart disease of native coronary artery without angina pectoris: Secondary | ICD-10-CM

## 2020-09-28 DIAGNOSIS — I10 Essential (primary) hypertension: Secondary | ICD-10-CM

## 2020-09-28 NOTE — Progress Notes
[facility]	 [facility]		Office VisitJames [name] is a 87 y.o.male followed by Dr. Frutoso Chase seen today in the [facility] office for a preoperative cardiovascular risk assessment and evaluation in anticipation of bilateral cataract surgeries on 10/05/20 and 10/19/20 with Dr. Caroleen Hamman.He has a past medical history of coronary artery disease s/p remote inferior wall myocardial infarction and stenting (by patient history), dyslipidemia, hypertension, abdominal aortic aneurysm (measuring 4.5 cm with partially thrombosed on Harvey scan, 2021), carotid artery stenosis s/p left carotid endarterectomy, chronic kidney disease, impaired fasting glucose, iron deficiency anemia, thalassemia, spinal stenosis, low back pain, vertigo, hearing loss and GERD.On his last office visit with Dr. Frutoso Chase (04/27/20) he reported progressive dyspnea on exertion and was noted with a 10 pound weight gain over the prior 6 months. Overall he was felt to be stable from a cardiovascular perspective and was advised no changes to his medications. He was encouraged to lose weight and increase his activity. A 31-month follow-up visit was also recommended.He had a visit with Rexene Agent, APRN on 07/24/20 for hospital follow-up after a presyncopal episode. Of note, ECG was unremarkable and there was no change in abdominal aortic aneurysm - the event was thought to be vasovagal in nature given unrevealing work up. During his cardiology visit he reported feeling unwell after discontinuation of Amlodipine and Losartan a few days prior by his PCP in the setting of low blood pressure. He reported elevated home blood pressure readings, headaches and a bounding pulse. He was advised to restart Amlodipine 2.5 mg daily and Losartan 50 mg daily. He was also encouraged to continue home blood pressure monitoring and increase hydration. A 4 week follow-up visit was recommended for blood pressure assessment.He has had no interval hospitalizations.Today his weight is 192.6 pounds, a 33.1 pound weight loss in approximately 2 months. He arrives to the visit with his daughter [name] and reports that he has been free of cardiac symptoms since his last office visit. He exercises daily at home by performing strengthening exercises and going for 1-block walks daily. He does not experience chest discomfort or other anginal symptoms with his activities or at rest. He notes mild exertional shortness of breath which usually occurs when he climbs stairs or up hill. This usually resolves quickly with rest. His home blood pressure readings average 120's mmHg systolic. He notes that he has lost weight by cutting down his meal portions. He had a preoperative visit with his PCP last week. He denies a history of bleeding, clotting and other blood disorders. He denies a history of TIA and stroke. He denies chest pain, palpitations, jaw pain, arm pain, other anginal symptoms, PND, orthopnea, syncope, presyncope, falls, bleeding, edema, and neurologic symptoms indicative of a stroke. He is compliant with all of his medications.    Problem list:Patient Active Problem List  Diagnosis Date Noted ? Allergic rhinitis 07/24/2019 ? Chronic sinusitis 07/24/2019 ? Essential hypertension 07/24/2019 ? Essential hypertension 07/24/2019 ? Essential hypertension 07/24/2019 ? Gastroesophageal reflux disease 07/24/2019 ? Gastroesophageal reflux disease 07/24/2019 ? Iron deficiency anemia 07/24/2019 ? Low back pain 07/24/2019 ? Mixed hyperlipidemia 07/24/2019 ? Other thalassemia (HC Code) (HC CODE) (HC Code) 07/24/2019   Sep 10, 2003 Entered By: Glenford Peers Comment: Beta thalassemia minor ? Acute cholecystitis 03/11/2019 ? Cholecystitis 03/10/2019 ? Bilateral hearing loss 01/28/2019 ? Impaired fasting glucose 08/02/2016 ? Carotid artery stenosis 06/07/2016 ? History of coronary artery stent placement 06/07/2016 ? Vertigo 07/10/2015 ? AAA (abdominal aortic aneurysm) (HC Code) 05/05/2015   Formatting of  this note might be different from the original.2016 3.6 cm [place] in [place], 2018 4 cm, no further f/u recommended by Vasc ? CAD (coronary artery disease) 05/05/2015 ? Dyslipidemia 05/05/2015 ? Gastroesophageal reflux disease without esophagitis 05/05/2015 ? Hypertensive kidney disease with stage 3 chronic kidney disease (HC Code) (HC CODE) (HC Code) 05/05/2015 ? 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 Spinal stenosis 05/05/2015 Medications:Current Outpatient Medications Medication Sig Dispense Refill ? amLODIPine (NORVASC) 2.5 mg tablet Take 1 tablet (2.5 mg total) by mouth daily. 30 tablet 3 ? aspirin 81 mg EC delayed release tablet Take 81 mg by mouth daily.   ? atorvastatin (LIPITOR) 80 mg tablet Take 80 mg by mouth daily.   ? b complex vitamins capsule Take 1 capsule by mouth daily.   ? carvediloL (COREG) 12.5 mg Immediate Release tablet Take 1 tablet (12.5 mg total) by mouth 2 (two) times daily with breakfast and dinner. 180 tablet 3 ? clopidogreL (PLAVIX) 75 mg tablet Take 75 mg by mouth daily.   ? gabapentin (NEURONTIN) 100 mg capsule Take 200 mg by mouth daily. hs   ? ipratropium (ATROVENT) 21 mcg (0.03 %) nasal spray USE 2 SPRAYS IN BOTH  NOSTRILS TWICE DAILY   ? isosorbide mononitrate (IMDUR) 30 mg 24 hr extended release tablet Take 30 mg by mouth daily.   ? losartan (COZAAR) 50 mg tablet Take 1 tablet (50 mg total) by mouth daily. 90 tablet 3 ? multivitamin tablet Take 1 tablet by mouth daily.   ? nitroGLYCERIN (NITROSTAT) 0.4 mg SL tablet Place 0.4 mg under the tongue every 5 (five) minutes as needed for chest pain. If no relief, call 911. May repeat every 5 minutes for a total of 3 tablets.   ? omeprazole (PRILOSEC) 20 mg capsule Take 20 mg by mouth daily.   No current facility-administered medications for this visit. Allergies:He is allergic to ace inhibitors.Past Medical History:Past Medical History: Diagnosis Date ? Chronic coronary artery disease  ? Hyperlipidemia  ? Hypertension  Past Surgical History:Past Surgical History: Procedure Laterality Date ? APPENDECTOMY   ? BLADDER SURGERY  03/01/2019 ? CHOLECYSTECTOMY   ? FRACTURE SURGERY    rifgt hand Social History:Social History Socioeconomic History ? Marital status: Married   Spouse name: Not on file ? Number of children: Not on file ? Years of education: Not on file ? Highest education level: Not on file Occupational History ? Not on file Tobacco Use ? Smoking status: Former Smoker ? Smokeless tobacco: Never Used Vaping Use ? Vaping Use: Never used Substance and Sexual Activity ? Alcohol use: Not Currently ? Drug use: Not on file ? Sexual activity: Not on file Other Topics Concern ? Not on file Social History Narrative ? Not on file Social Determinants of Health Financial Resource Strain: Not on file Food Insecurity: Not on file Transportation Needs: Not on file Physical Activity: Not on file Stress: Not on file Social Connections: Not on file Intimate Partner Violence: Not on file Housing Stability: Not on file Family History:Family History Problem Relation Age of Onset ? Heart disease Mother  ROS The following ROS documentation is the transcribed review of systems completed by the patient at intake:Review of Systems:Constitution: negativeHENT: negativeEyes: negativeCardiovascular: negativeRespiratory: negativeEndocrine: negativeHematologic/Lymphatic: negativeSkin: negativeMusculoskeletal: negativeGastrointestinal: negativeGenitourinary: negativeNeurological: negativePsychiatric/Behavioral: negativeAllergic/Immunologic: negativeThe remainder of the 12-point review of systems was reviewed with the patient and is negative.Vital Signs: BP 122/64 (Site: r a, Position: Sitting)  - Pulse 64  - Ht 5' 11 (1.803 m)  - Wt 87.4 kg  - SpO2  98%  - BMI 26.86 kg/m? Wt Readings from Last 3 Encounters: 09/28/20 87.4 kg 07/24/20 102.6 kg 04/27/20 102.5 kg Physical ExamGeneral: alert, speaking easily, NAD. Using a cane to ambulate.HEENT: nonicteric, mmm.Neck: no JVD or carotid bruit.Heart: regular heart tones. S1,S2. No murmurs, rubs, or gallops.Lungs: CTA bilaterally. Unlabored respirations. No rales, wheeze, or rhonchi.Abdomen: soft, nontender, +BS. No palpable enlargement or bruit of the abdominal aorta.Extremities: no pedal edema or cyanosis. +2 DP pulses bilaterally.Skin: warm and dry.Psych: friendly, talkative, appropriate to situation. A&O x 3.Labs: 07/16/20 13:51 Sodium 139 Potassium 4.9 Chloride 103 CO2 22 Anion Gap 14 BUN 38 (H) Creatinine 2.00 (H) BUN/Creatinine Ratio 19.0 eGFR (NON African-American) 31 eGFR (Afr Amer) 38 Glucose 172 (H) Calcium 9.5 Magnesium 2.2 Total Bilirubin 1.4 (H) Bilirubin, Direct 0.3 Alkaline Phosphatase 75 Alanine Aminotransferase (ALT) 22 Aspartate Aminotransferase (AST) 24 AST/ALT Ratio 1.1 Total Protein 7.1 Albumin 4.4 Globulin 2.7 A/G Ratio 1.6 Lipase 28 High Sensitivity Troponin T 39 (H) WBC 5.2 RBC 5.72 Hemoglobin 13.6 Hematocrit 41.70 MCV 72.9 (L) MCH 23.8 (L) MCHC 32.6 RDW-CV 15.9 (H) Platelets 165 MPV 10.8 Neutrophils 65.9 Lymphocytes 20.3 Monocytes 8.6 Eosinophils 4.2 Basophils 0.6 Immature Granulocytes 0.4 nRBC 0.0 ANC (Abs Neutrophil Count) 3.43 Absolute Lymphocyte Count 1.06 Monocytes (Absolute) 0.45 Eosinophil Absolute Count 0.22 Basophils Absolute 0.03 Immature Granulocytes (Abs) 0.02 nRBC Absolute 0.00 Lipid Panel:Lab Results Component Value Date  CHOL 106 07/31/2019  TRIG 67 07/31/2019  HDL 41 07/31/2019  LDL 51 16/11/9602 EKG: sinus rhythm, borderline left axis deviation, low voltage precordial leads, nonspecific T abnormalities, 66 bpm. No significant change from prior tracing obtained 07/24/20.ECHO:  No results found for this or any previous visit.STRESS TEST: No results found for this or any previous visit. IMPRESSION/PLAN:?	Cardiovascular Preoperative Risk Assessment: Mr. Wight presents today for a preoperative cardiovascular risk assessment in anticipation of bilateral cataract surgeries on 10/05/20 and 10/19/20 with Dr. Caroleen Hamman. He reports no chest pain, shortness of breath, exertional or anginal symptoms, or other manifestations that would raise concern for cardiac etiology indicating a need for further cardiac testing. He appears well-compensated on physical examination today with stable vital signs and ECG. At this juncture, Mr. Callander is stable from a cardiovascular perspective and no contraindications have been identified to the planned cataract surgery procedures. He may continue his cardiac medications without interruption for the procedures. **Follow-up with Dr. Frutoso Chase on 11/1/22I encouraged the patient to call me with any questions, concerns, or any worsening or new symptoms.Tad Moore, APRN8/1/202212:59 PMY[facility] Office: [phone]) [phone] Signed by Tad Moore, APRN, September 28, 2020

## 2020-09-28 NOTE — Patient Instructions
You may continue your cardiac medications without interruption for your upcoming cataract proceduresCall the office for questions, concerns or symptomsFollow-up with Dr. Frutoso Chase in 3 monthsGOOD LUCK WITH YOUR PROCEDURE!!!

## 2020-10-02 ENCOUNTER — Ambulatory Visit: Admit: 2020-10-02 | Payer: PRIVATE HEALTH INSURANCE | Attending: Urology | Primary: Internal Medicine

## 2020-10-02 ENCOUNTER — Telehealth: Admit: 2020-10-02 | Payer: PRIVATE HEALTH INSURANCE | Attending: Urology | Primary: Internal Medicine

## 2020-10-02 NOTE — Telephone Encounter
Patient's daughter called to cancel Cystoscopy because Mark [name] feels weak.  Sent message to Leads and Manager on when to schedule.

## 2020-10-02 NOTE — Telephone Encounter
Left voice message for [name] - to offer Aug. 31, 2022 at 8:30 am with Dr. Pleas Patricia for Cystoscopy -  Appointment is on hold for this patient.

## 2020-10-02 NOTE — Telephone Encounter
[name] returned call to decline appointment offer - this is too early for her fatherCan be reached [phone], [phone] or [phone]

## 2020-10-27 ENCOUNTER — Ambulatory Visit: Admit: 2020-10-27 | Payer: PRIVATE HEALTH INSURANCE | Attending: Cardiovascular Disease | Primary: Internal Medicine

## 2020-11-11 ENCOUNTER — Encounter: Admit: 2020-11-11 | Payer: PRIVATE HEALTH INSURANCE | Attending: Urology | Primary: Internal Medicine

## 2020-11-11 ENCOUNTER — Ambulatory Visit: Admit: 2020-11-11 | Payer: PRIVATE HEALTH INSURANCE | Attending: Urology | Primary: Internal Medicine

## 2020-11-11 DIAGNOSIS — E785 Hyperlipidemia, unspecified: Secondary | ICD-10-CM

## 2020-11-11 DIAGNOSIS — I2510 Atherosclerotic heart disease of native coronary artery without angina pectoris: Secondary | ICD-10-CM

## 2020-11-11 DIAGNOSIS — N399 Disorder of urinary system, unspecified: Secondary | ICD-10-CM

## 2020-11-11 DIAGNOSIS — I10 Essential (primary) hypertension: Secondary | ICD-10-CM

## 2020-11-11 DIAGNOSIS — N3289 Other specified disorders of bladder: Secondary | ICD-10-CM

## 2020-11-11 MED ORDER — LIDOCAINE 2 % MUCOSAL JELLY IN APPLICATOR
2 % | Freq: Once | URETHRAL | Status: CP
Start: 2020-11-11 — End: ?
  Administered 2020-11-11: 21:00:00 2 mL via URETHRAL

## 2020-11-11 MED ORDER — FOSFOMYCIN TROMETHAMINE 3 GRAM ORAL PACKET
3 gram | Freq: Once | ORAL | Status: CP
Start: 2020-11-11 — End: ?
  Administered 2020-11-11: 21:00:00 3 gram via ORAL

## 2020-11-12 DIAGNOSIS — I10 Essential (primary) hypertension: Secondary | ICD-10-CM

## 2020-11-12 DIAGNOSIS — Z7902 Long term (current) use of antithrombotics/antiplatelets: Secondary | ICD-10-CM

## 2020-11-12 DIAGNOSIS — Z79899 Other long term (current) drug therapy: Secondary | ICD-10-CM

## 2020-11-12 DIAGNOSIS — Z8249 Family history of ischemic heart disease and other diseases of the circulatory system: Secondary | ICD-10-CM

## 2020-11-12 DIAGNOSIS — E785 Hyperlipidemia, unspecified: Secondary | ICD-10-CM

## 2020-11-12 DIAGNOSIS — Z9049 Acquired absence of other specified parts of digestive tract: Secondary | ICD-10-CM

## 2020-11-12 DIAGNOSIS — I2510 Atherosclerotic heart disease of native coronary artery without angina pectoris: Secondary | ICD-10-CM

## 2020-11-12 DIAGNOSIS — R55 Syncope and collapse: Secondary | ICD-10-CM

## 2020-11-12 DIAGNOSIS — Z7982 Long term (current) use of aspirin: Secondary | ICD-10-CM

## 2020-11-16 NOTE — Progress Notes
Mark [name] is here today for a cystoscopy.  Patient informed of procedure and questions answered.  Instructions for pre procedure given to patient.  Written consent and time out obtained.  Patient prepped according to protocol.  Tolerated procedure well.  VSS.  Post procedure instructions reviewed with patient.

## 2020-11-16 NOTE — Progress Notes
Time Out Documentation?	Procedure Name: Cystoscopy?	Procedure Time: 5:20  PM?	Team Members Present: Dr. Milda Smart. [place], Liliane Bade RN?	Time Out initiated after patient positioned & prior to beginning of procedure: Yes?	Name of Patient  & MRUN # or DOB stated and matches ID band or previously confirmed med record number: Yes?	Proceduralist states or confirms procedure to be performed: Yes?	Procedural consent is used to verify procedure performed  & matches pt identifiers:Yes?	Site of procedure(s) (with laterality or level) is topically marked per policy & visible after draping:N/A ?	Lot number on scope:  [id number].

## 2020-11-16 NOTE — Progress Notes
HPIJames [name] is a 87 y.o. male who presents with a chief complaint [id number] Diagnoses Name SNOMED [place](R) Primary? ? Bladder wall thickening HYPERTROPHY OF BLADDER Yes ? Disease of urinary tract DISORDER OF URINARY TRACT  Patient presented to [name], on 09/07/20 for evaluation of abnormal [place] findings found during an ER visit for a near syncopal episode. [place] abdomen/pelvis was done 07/16/20 showed increased bladder wall thickening of the right lateral aspect of the bladder wall with indented appearance. Patient has no reports of gross hematuria, dysuria, urgency or frequency.  He has no voiding difficulties.  Voids with a good stream/flow and feels like he empties well.  No SP pain or pressure.  Denies flank pain, fevers or chills.  No personal or family history of kidney stones or GU malignancies. Former smoker, years ago. Urine cytology was positive for atypical urothelial cells.Today, the patient presents for initial evaluation with flexible cystoscopy. He is accompanied by his daughter. He is on Plavix.Past Medical HistoryPast Medical History: Diagnosis Date ? Chronic coronary artery disease  ? Hyperlipidemia  ? Hypertension  Past Surgical HistoryPast Surgical History: Procedure Laterality Date ? APPENDECTOMY   ? BLADDER SURGERY  03/01/2019 ? CHOLECYSTECTOMY   ? FRACTURE SURGERY    rifgt hand AllergiesAllergies Allergen Reactions ? Ace Inhibitors Cough MedicationsOutpatient Encounter Medications as of 11/11/2020 Medication Sig Dispense Refill ? amLODIPine (NORVASC) 2.5 mg tablet Take 1 tablet (2.5 mg total) by mouth daily. 30 tablet 3 ? aspirin 81 mg EC delayed release tablet Take 81 mg by mouth daily.   ? atorvastatin (LIPITOR) 80 mg tablet Take 80 mg by mouth daily.   ? b complex vitamins capsule Take 1 capsule by mouth daily.   ? carvediloL (COREG) 12.5 mg Immediate Release tablet Take 1 tablet (12.5 mg total) by mouth 2 (two) times daily with breakfast and dinner. 180 tablet 3 ? clopidogreL (PLAVIX) 75 mg tablet Take 75 mg by mouth daily.   ? gabapentin (NEURONTIN) 100 mg capsule Take 200 mg by mouth daily. hs   ? ipratropium (ATROVENT) 21 mcg (0.03 %) nasal spray USE 2 SPRAYS IN BOTH  NOSTRILS TWICE DAILY   ? isosorbide mononitrate (IMDUR) 30 mg 24 hr extended release tablet Take 30 mg by mouth daily.   ? losartan (COZAAR) 50 mg tablet Take 1 tablet (50 mg total) by mouth daily. 90 tablet 3 ? multivitamin tablet Take 1 tablet by mouth daily.   ? nitroGLYCERIN (NITROSTAT) 0.4 mg SL tablet Place 0.4 mg under the tongue every 5 (five) minutes as needed for chest pain. If no relief, call 911. May repeat every 5 minutes for a total of 3 tablets.   ? omeprazole (PRILOSEC) 20 mg capsule Take 20 mg by mouth daily.   ? [EXPIRED] fosfomycin (MONUROL) packet 3 g    ? [EXPIRED] lidocaine uro-jet (XYLOCAINE) 2 % jelly 11 mL    No facility-administered encounter medications on file as of 11/11/2020.  Social HistorySocial History Tobacco Use ? Smoking status: Former Smoker ? Smokeless tobacco: Never Used Vaping Use ? Vaping Use: Never used Substance Use Topics ? Alcohol use: Not Currently  Family History Family History Problem Relation Age of Onset ? Heart disease Mother  Review of SystemsNo symptoms beyond those noted in HPI and PMH- all other systems are negative.Physical ExamBP (!) 157/70 (Site: r a, Position: Sitting, Cuff Size: Large)  - Pulse 69  - Temp 97.4 ?F (36.3 ?C) (No-touch scanner)  - Ht 5' 10 (1.778 m)  - Wt 95.3  kg  - SpO2 98%  - BMI 30.13 kg/m? Constitutional: Oriented to person, place, and time. Appears well-developed and well-nourished. Head: Normocephalic and atraumatic. Eyes: Conjunctivae are normal. Neck: No tracheal deviation present. No supraclavicular adenopathy. Pulmonary/Chest: Effort normal. Abdominal: Soft and non-tender.Musculoskeletal: Normal range of motion. Neurological: Alert and oriented to person, place, and time. No focal deficits are evident. Skin: Skin is dry. Psychiatric: Has a normal mood and affect. Behavior is normal. Judgment and thought content normal. Cystoscopy: Anterior urethra normal. Posterior urethra normal. Bladder compliant. Bladder is thick walled. No diverticula. No stones. No tumor. No trabeculation. Right and left ureteral orifices orthotopic with clear efflux.Lab Results Component Value Date  UCOLOR yellow 11/11/2020  UGLUCOSE Negative 11/11/2020  UKETONE Negative 11/11/2020  USPECGRAVITY 1.020 11/11/2020  UPROTEIN Trace 11/11/2020  UNITRATES Negative 11/11/2020  UBLOOD 1+ 11/11/2020  ULEUKOCYTES Negative 11/11/2020   No results found for: PVRPOCTLab Results Component Value Date  BUN 38 (H) 07/16/2020 Lab Results Component Value Date  CREATININE 2.00 (H) 07/16/2020 Assessment & Plan:Mark [name] is a 87 y.o. male Encounter Diagnoses Name SNOMED [place](R) Primary? ? Bladder wall thickening HYPERTROPHY OF BLADDER Yes ? Disease of urinary tract DISORDER OF URINARY TRACT  Orders Placed This Encounter Procedures ? POC urinalysis manual w/o scope Risks/benefits of flexible cystoscopy discussed with patient. Patient signed consent form prior to procedure. Today's cysto notable for a thick-walled bladder, otherwise negative. Return for reevaluation as needed. Patient will let us know if any new problems or symptoms arise in the interim.Milda Smart Persais Ethridge, MDScribed for Royann Shivers., MD by Rossie Muskrat, medical scribe, September 18, 2022The documentation recorded by the scribe accurately reflects the services I personally performed and the decisions made by me. I reviewed and confirmed all material entered and/or pre-charted by the scribe.

## 2020-11-17 ENCOUNTER — Encounter: Admit: 2020-11-17 | Payer: PRIVATE HEALTH INSURANCE | Attending: Urology | Primary: Internal Medicine

## 2020-11-20 ENCOUNTER — Encounter: Admit: 2020-11-20 | Payer: PRIVATE HEALTH INSURANCE | Attending: Urology | Primary: Internal Medicine

## 2020-12-15 ENCOUNTER — Encounter: Admit: 2020-12-15 | Payer: PRIVATE HEALTH INSURANCE | Attending: Urology | Primary: Internal Medicine

## 2020-12-29 ENCOUNTER — Encounter: Admit: 2020-12-29 | Payer: PRIVATE HEALTH INSURANCE | Attending: Cardiovascular Disease | Primary: Internal Medicine

## 2020-12-29 ENCOUNTER — Ambulatory Visit: Admit: 2020-12-29 | Payer: PRIVATE HEALTH INSURANCE | Attending: Cardiovascular Disease | Primary: Internal Medicine

## 2020-12-29 DIAGNOSIS — I2510 Atherosclerotic heart disease of native coronary artery without angina pectoris: Secondary | ICD-10-CM

## 2020-12-29 DIAGNOSIS — E785 Hyperlipidemia, unspecified: Secondary | ICD-10-CM

## 2020-12-29 DIAGNOSIS — Z955 Presence of coronary angioplasty implant and graft: Secondary | ICD-10-CM

## 2020-12-29 DIAGNOSIS — I10 Essential (primary) hypertension: Secondary | ICD-10-CM

## 2020-12-29 DIAGNOSIS — R0609 Other forms of dyspnea: Secondary | ICD-10-CM

## 2020-12-29 MED ORDER — NITROGLYCERIN 0.4 MG SUBLINGUAL TABLET
0.4 mg | ORAL_TABLET | SUBLINGUAL | 4 refills | Status: AC | PRN
Start: 2020-12-29 — End: ?

## 2020-12-29 NOTE — Progress Notes
[place] [facility] Cardiology Office VisitInterval History: This is a 87 y.o. male with a history of known coronary artery disease status post remote history of an IMI by patient's history, cardiac stenting, longstanding, well controlled hypertension, s/p left carotid endarterectomy, with a history of a known abdominal aortic aneurysm (4.5cm) who returns for a follow up. The patient was seen at [facility] on 07/16/20 for near syncope. He was diaphoretic and had weakness. His wife was concerned because he was found hunched over on the toilet.  He subsequently was seen in the office by an a p.r.n. and found to be hypertensive.  His amlodipine and losartan restarted.He was again seen in the office on August 1st for preop cataract visit.  At that time he denied any change in his status and he underwent the cataract surgery without complications.  Currently, the patient generally feels well.  He denies chest pain or need for nitroglycerin.  He does have chronic dyspnea on exertion, especially when walking up inclines but reports this is not significantly changed compared to prior.  He denies any palpitations, peripheral edema or syncope.Social History: He?lives with his wife and daughter.?His mother had CAD. He?has 3?daughters. 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 He drinks?2 cups coffee daily, no alcohol.?Review of Allergies/Medical History/Medications: Past Medical History: Diagnosis Date ? Chronic coronary artery disease  ? Hyperlipidemia  ? Hypertension  Past Surgical History: Procedure Laterality Date ? APPENDECTOMY   ? BLADDER SURGERY  03/01/2019 ? CHOLECYSTECTOMY   ? FRACTURE SURGERY    rifgt hand Outpatient Medications Marked as Taking for the 12/29/20 encounter (Office Visit) with [name]., MD Medication Sig Dispense Refill ? amLODIPine (NORVASC) 2.5 mg tablet Take 1 tablet (2.5 mg total) by mouth daily. 30 tablet 3 ? aspirin 81 mg EC delayed release tablet Take 81 mg by mouth daily.   ? atorvastatin (LIPITOR) 80 mg tablet Take 80 mg by mouth daily.   ? b complex vitamins capsule Take 1 capsule by mouth daily.   ? carvediloL (COREG) 12.5 mg Immediate Release tablet Take 1 tablet (12.5 mg total) by mouth 2 (two) times daily with breakfast and dinner. 180 tablet 3 ? clopidogreL (PLAVIX) 75 mg tablet Take 75 mg by mouth daily.   ? gabapentin (NEURONTIN) 100 mg capsule Take 200 mg by mouth daily. hs   ? ipratropium (ATROVENT) 21 mcg (0.03 %) nasal spray USE 2 SPRAYS IN BOTH  NOSTRILS TWICE DAILY   ? isosorbide mononitrate (IMDUR) 30 mg 24 hr extended release tablet Take 30 mg by mouth daily.   ? losartan (COZAAR) 50 mg tablet Take 1 tablet (50 mg total) by mouth daily. 90 tablet 3 ? multivitamin tablet Take 1 tablet by mouth daily.   ? omeprazole (PRILOSEC) 20 mg capsule Take 20 mg by mouth daily.   ? [DISCONTINUED] nitroGLYCERIN (NITROSTAT) 0.4 mg SL tablet Place 0.4 mg under the tongue every 5 (five) minutes as needed for chest pain. If no relief, call 911. May repeat every 5 minutes for a total of 3 tablets.   Allergies Allergen Reactions ? Ace Inhibitors Cough Review of Systems: Review of Systems Constitutional: Negative.  HENT: Negative.  Eyes: Negative.  Respiratory: Negative.  Cardiovascular: Negative.  Gastrointestinal: Negative.  Genitourinary: Negative.  Musculoskeletal: Negative.  Skin: Negative.  Neurological: Negative.  Endo/Heme/Allergies: Negative.  Psychiatric/Behavioral: Negative.  Physical Exam: Vitals:  12/29/20 1532 BP: 130/62 Site: Left Arm Position: Sitting Cuff Size: Small Pulse: 78 SpO2: 98% Weight: 99.1 kg  Height: 5' 10 (1.778 m) BP 130/62 (Site: l a, Position: Sitting, Cuff Size: Small)  - Pulse 78  - Ht 5' 10 (1.778 m)  - Wt 99.1 kg  - SpO2 98%  - BMI 31.34 kg/m?  Physical ExamConstitutional:     General: He is not in acute distress.   Appearance: He is well-developed. Neck:    Vascular: Normal carotid pulses. No carotid bruit, hepatojugular reflux or JVD. Cardiovascular:    Rate and Rhythm: Normal rate and regular rhythm.    Heart sounds: No murmur heard.  No friction rub. Pulmonary:    Effort: Pulmonary effort is normal.    Breath sounds: Normal breath sounds. No wheezing or rales. Abdominal:    General: Bowel sounds are normal.    Palpations: Abdomen is soft.    Tenderness: There is no abdominal tenderness. There is no rebound. Musculoskeletal:       General: Normal range of motion.    Cervical back: Normal range of motion and neck supple.    Right lower leg: No edema.    Left lower leg: No edema. Skin:   General: Skin is warm and dry. Neurological:    Mental Status: He is alert and oriented to person, place, and time. Psychiatric:       Behavior: Behavior normal. Review of Labs/Diagnostics: Echo Findings: No results found for this or any previous visit.Stress Test: No results found for this or any previous visit.Lab Review:  Ref Range & Units 07/21/20 ?3:26 PM Glucose External 65 - 99 mg/dL 409?High?  Comment:  ?? ? ? ? ? Fasting reference interval For someone without known diabetes, a glucose value >125 mg/dL indicates that they may have diabetes and this should be confirmed with a follow-up test. Blood Urea Nitrogen (BUN) External 7 - 25 mg/dL 40?High?  Creatinine External 0.70 - 1.11 mg/dL 8.11?High?  Comment: For patients >72 years of age, the reference limit for Creatinine is approximately 13% higher for people identified as African-American. eGFR Non-African American > OR = 60 mL/min/1.40m2 28?Low?  eGFR African American > OR = 60 mL/min/1.48m2 33?Low?  BUN/Creatinine Ratio 6 - 22 (calc) 20  Sodium External 135 - 146 mmol/L 138  Potassium External 3.5 - 5.3 mmol/L 4.8  Chloride External 98 - 110 mmol/L 105  CO2 External 20 - 32 mmol/L 27  Calcium External 8.6 - 10.3 mg/dL 8.9  Lab Results Component Value Date  CREATININE 2.00 (H) 07/16/2020  BUN 38 (H) 07/16/2020  NA 139 07/16/2020  K 4.9 07/16/2020  CL 103 07/16/2020  CO2 22 07/16/2020 Lab Results Component Value Date  CHOL 106 07/31/2019  LDL 51 07/31/2019  TRIG 67 07/31/2019  HDL 41 07/31/2019  Component    Latest Ref Rng & Units 07/21/2020 Hemoglobin A1c (External)    <5.7 % of total Hgb 5.8 (H) Lab Results Component Value Date  HGBA1C 5.6 07/24/2019 Recent Studies:  Encounter Diagnosis:    ICD-10-CM  1. Essential hypertension  I10 Echo 2D Complete w Doppler and CFI if Ind Image Enhancement 3D and or bubbles 2. Dyslipidemia  E78.5 Echo 2D Complete w Doppler and CFI if Ind Image Enhancement 3D and or bubbles 3. History of coronary artery stent placement  Z95.5 Echo 2D Complete w Doppler and CFI if Ind Image Enhancement 3D and or bubbles 4. DOE (dyspnea on exertion)  R06.09 Echo 2D Complete w Doppler and CFI if Ind Image Enhancement 3D and or bubbles Impression and Plan: 87 year old male with known coronary artery disease status post  remote history of an IMI by patient's history, longstanding, well controlled hypertension, status post a left carotid endarterectomy, with a history of a known abdominal aortic aneurysm?returns for follow-up. Over the past 6 months, the patient has been stable from a cardiovascular standpoint without chest pain, syncope or palpitations.  He does admit to progressive dyspnea on exertion.  He is not used nitroglycerin in quite some time. ?Taken today on exam, his blood pressure is well controlled, he is in sinus rhythm.  The rest his cardiovascular exam is unremarkable.  For now, I will have the patient undergo repeat echocardiography to evaluate his complaints of progressive dyspnea on exertion.  He also has a murmur of aortic sclerosis and I want to ensure this is not aortic stenosis.  In the interim, I encouraged the patient to remain active.  He is regular follow-up with his PCP.  I will call the patient with results of the echocardiogram and assuming no significant pathology is noted, will plan on seeing the patient in 6 monthsScribed for [name]., MD by Dola Argyle, medical scribe December 29, 2020   The documentation recorded by the scribe accurately reflects the services I personally performed and the decisions made by me. I reviewed and confirmed all material entered and/or pre-charted by the scribe.  Signed: Charleston Ropes, MD.  For questions [phone]/1/20226:56 AM

## 2020-12-30 DIAGNOSIS — I10 Essential (primary) hypertension: Secondary | ICD-10-CM

## 2021-01-01 ENCOUNTER — Inpatient Hospital Stay: Admit: 2021-01-01 | Discharge: 2021-01-01 | Payer: PRIVATE HEALTH INSURANCE | Primary: Internal Medicine

## 2021-01-01 DIAGNOSIS — R0609 Other forms of dyspnea: Secondary | ICD-10-CM

## 2021-01-01 DIAGNOSIS — Z955 Presence of coronary angioplasty implant and graft: Secondary | ICD-10-CM

## 2021-01-01 DIAGNOSIS — I10 Essential (primary) hypertension: Secondary | ICD-10-CM

## 2021-01-01 DIAGNOSIS — E785 Hyperlipidemia, unspecified: Secondary | ICD-10-CM

## 2021-01-01 MED ORDER — SULFUR HEXAFLUORIDE MICROSPHERES 25 MG INTRAVENOUS SUSPENSION
25 mg | Freq: Once | INTRAVENOUS | Status: CP | PRN
Start: 2021-01-01 — End: ?
  Administered 2021-01-01: 18:00:00 25 mL via INTRAVENOUS

## 2021-01-01 MED ORDER — SODIUM CHLORIDE 0.9 % INJECTION SOLUTION
Freq: Once | INTRAVENOUS | Status: CP
Start: 2021-01-01 — End: ?
  Administered 2021-01-01: 18:00:00 via INTRAVENOUS

## 2021-01-04 ENCOUNTER — Telehealth: Admit: 2021-01-04 | Payer: PRIVATE HEALTH INSURANCE | Attending: Cardiovascular Disease | Primary: Internal Medicine

## 2021-01-04 NOTE — Telephone Encounter
I called and spoke to patient regarding MD message about his echo results. Pt verbalized understanding.

## 2021-03-25 ENCOUNTER — Encounter: Admit: 2021-03-25 | Payer: PRIVATE HEALTH INSURANCE | Attending: Cardiovascular Disease | Primary: Internal Medicine

## 2021-03-26 MED ORDER — CARVEDILOL IMMEDIATE RELEASE 12.5 MG TABLET
12.5 mg | ORAL_TABLET | 4 refills | Status: AC
Start: 2021-03-26 — End: ?

## 2021-07-01 ENCOUNTER — Ambulatory Visit: Admit: 2021-07-01 | Payer: PRIVATE HEALTH INSURANCE | Attending: Cardiovascular Disease | Primary: Internal Medicine

## 2021-07-01 ENCOUNTER — Encounter: Admit: 2021-07-01 | Payer: PRIVATE HEALTH INSURANCE | Attending: Cardiovascular Disease | Primary: Internal Medicine

## 2021-07-01 DIAGNOSIS — I2510 Atherosclerotic heart disease of native coronary artery without angina pectoris: Secondary | ICD-10-CM

## 2021-07-01 DIAGNOSIS — E785 Hyperlipidemia, unspecified: Secondary | ICD-10-CM

## 2021-07-01 DIAGNOSIS — I10 Essential (primary) hypertension: Secondary | ICD-10-CM

## 2021-07-01 DIAGNOSIS — I714 Abdominal aortic aneurysm (AAA) without rupture, unspecified part (HC Code): Secondary | ICD-10-CM

## 2021-07-01 DIAGNOSIS — E782 Mixed hyperlipidemia: Secondary | ICD-10-CM

## 2021-07-01 NOTE — Progress Notes
[place] [facility] Cardiology Office VisitInterval History: This is a 88 y.o. male with a history of known coronary artery disease status post remote history of an IMI by patient's history,?cardiac stenting,?longstanding, well controlled hypertension, s/p?left carotid endarterectomy, with a history of a known abdominal aortic aneurysm (4.5cm) who returns for a 6 month follow up. He denies any recent hospitalizations. The patient is accompanied by his wife and daughter. The patient has been staying active by doing household duties. He reports he gets dyspnea on exertion, such as walking to the mailbox or going up stairs. Symptoms have worsened in the past 6 months. He usually has to sit and rest for a few minutes before continuing to walk at times. The patient is also bothered by arthritic pains. He did some occupational therapy at home. He denies any other exertional symptoms. He denies any chest pain or palpitations. He sleeps in a bed with one pillow. He denies waking up short of breath. The patient occasionally has wheezing. He was previously found to have lung nodules. He denies coughing up blood. No orthopnea or PND. The patient has been trying to watch the salt in his diet. He denies any peripheral edema or claudication. No syncope, falls or loss of consciousness. Social History: He?lives with his wife and daughter.?His mother had CAD. He?has 3?daughters. 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 He drinks?2 cups coffee daily, no alcohol.?Review of Allergies/Medical History/Medications: Past Medical History: Diagnosis Date ? Chronic coronary artery disease  ? Hyperlipidemia  ? Hypertension  Past Surgical History: Procedure Laterality Date ? APPENDECTOMY   ? BLADDER SURGERY  03/01/2019 ? CHOLECYSTECTOMY   ? FRACTURE SURGERY    rifgt hand Outpatient Medications Marked as Taking for the 07/01/21 encounter (Office Visit) with [name]., MD Medication Sig Dispense Refill ? amLODIPine (NORVASC) 2.5 mg tablet Take 1 tablet (2.5 mg total) by mouth daily. 30 tablet 3 ? aspirin 81 mg EC delayed release tablet Take 1 tablet (81 mg total) by mouth daily.   ? atorvastatin (LIPITOR) 80 mg tablet Take 1 tablet (80 mg total) by mouth daily.   ? b complex vitamins capsule Take 1 capsule by mouth daily.   ? carvediloL (COREG) 12.5 mg Immediate Release tablet TAKE 1 TABLET BY MOUTH  TWICE DAILY WITH BREAKFAST  AND DINNER 180 tablet 3 ? clopidogreL (PLAVIX) 75 mg tablet Take 1 tablet (75 mg total) by mouth daily.   ? gabapentin (NEURONTIN) 100 mg capsule Take 2 capsules (200 mg total) by mouth daily. hs   ? ipratropium (ATROVENT) 21 mcg (0.03 %) nasal spray USE 2 SPRAYS IN BOTH  NOSTRILS TWICE DAILY   ? isosorbide mononitrate (IMDUR) 30 mg 24 hr extended release tablet Take 1 tablet (30 mg total) by mouth daily.   ? losartan (COZAAR) 50 mg tablet Take 1 tablet (50 mg total) by mouth daily. 90 tablet 3 ? multivitamin tablet Take 1 tablet by mouth daily.   ? nitroGLYCERIN (NITROSTAT) 0.4 mg SL tablet Place 1 tablet (0.4 mg total) under the tongue every 5 (five) minutes as needed for chest pain. If no relief, call 911. May repeat every 5 minutes for a total of 3 tablets. 25 tablet 3 ? omeprazole (PRILOSEC) 20 mg capsule Take 1 capsule (20 mg total) by mouth daily.   Allergies Allergen Reactions ? Ace Inhibitors Cough Review of Systems: Review of Systems Constitutional: Negative.  HENT: Negative.  Eyes: Negative.  Respiratory:      HPI Cardiovascular:  Negative.  Gastrointestinal: Negative.  Genitourinary: Negative.  Musculoskeletal:      HPI Skin: Negative.  Neurological: Negative.  Endo/Heme/Allergies: Negative.  Psychiatric/Behavioral: Negative.  Physical Exam: Vitals:  07/01/21 1118 07/01/21 1128 BP: 118/70 110/60 Site: Right Arm Position: Sitting Sitting Cuff Size: Large  Pulse: 69  Weight: 100 kg  Height: 5' 10 (1.778 m)  PainSc:   0 - No pain  BP 110/60 (Position: Sitting)  - Pulse 69  - Ht 5' 10 (1.778 m)  - Wt 100 kg  - BMI 31.62 kg/m?  Physical ExamConstitutional:     General: He is not in acute distress.   Appearance: He is well-developed. Neck:    Vascular: Normal carotid pulses. No carotid bruit, hepatojugular reflux or JVD. Cardiovascular:    Rate and Rhythm: Normal rate and regular rhythm.    Heart sounds: Murmur (SEM at LSB) heard.  Systolic murmur is present.  No friction rub. Pulmonary:    Effort: Pulmonary effort is normal.    Breath sounds: Normal breath sounds. No wheezing or rales. Abdominal:    General: Bowel sounds are normal.    Palpations: Abdomen is soft.    Tenderness: There is no abdominal tenderness. There is no rebound. Musculoskeletal:       General: Normal range of motion.    Cervical back: Normal range of motion and neck supple.    Right lower leg: No edema.    Left lower leg: No edema. Skin:   General: Skin is warm and dry. Neurological:    Mental Status: He is alert and oriented to person, place, and time. Psychiatric:       Behavior: Behavior normal. Review of Labs/Diagnostics: ECG/Tele Events: Independently interpreted by me:  Normal sinus rhythm, left axis deviation, no acute ST or T-wave changesEcho Findings: Results for orders placed or performed during the hospital encounter of 01/01/21 Echo 2D Complete w Doppler and CFI if Ind Image Enhancement 3D and or bubbles Result Value Ref Range  Reported Biplane EF% 62 %  Narrative   * Normal left ventricular size and systolic function. Mild concentric left ventricular hypertrophy.  LVEF calculated by biplane Simpson's was 62%.  Abnormal tissue Doppler suggestive of abnormal diastolic function.* Normal right ventricular cavity size and systolic function.  Estimated right ventricular systolic pressure is 27 mmHg.* Atria are normal in size* Aortic valve is trileaflet.  Mild aortic valve calcification.  Aortic sclerosis without stenosis.  Peak aortic velocity is 1.6 m/s.  Mean gradient is 6 mmHg.  Dimensionless index is 0.70.  Trace aortic regurgitation.* Mitral valve appears calcified.  Mild anterior and moderate posterior mitral annular calcification.  No mitral regurgitation.  No mitral stenosis.* IVC diameter < 2.1 cm that collapses > 50% with a sniff suggests normal RAP (0-5 mmHg, mean 3 mmHg).* No significant pericardial effusion* No prior study available for comparison Stress Test: No results found for this or any previous visit.Lab Review:  Ref Range & Units 05/14/21 ?1:09 PM Glucose External 65 - 139 mg/dL 914  Comment:  ?? ? ? Non-fasting reference interval Blood Urea Nitrogen (BUN) External 7 - 25 mg/dL 35?High?  Creatinine External 0.70 - 1.22 mg/dL 7.82?High?  Creatinine w/ eGFR > OR = 60 mL/min/1.38m2 33?Low?  Comment: The eGFR is based on the CKD-EPI 2021 equation. To calculate the new eGFR from a previous Creatinine or Cystatin C result, go to https://www.kidney.org/professionals/ kdoqi/gfr%5Fcalculator BUN/Creatinine Ratio 6 - 22 (calc) 19  Sodium External 135 - 146 mmol/L 138  Potassium External 3.5 -  5.3 mmol/L 4.7  Chloride External 98 - 110 mmol/L 104  CO2 External 20 - 32 mmol/L 28  Calcium External 8.6 - 10.3 mg/dL 8.9  Lab Results Component Value Date  CREATININE 2.00 (H) 07/16/2020  BUN 38 (H) 07/16/2020  NA 139 07/16/2020  K 4.9 07/16/2020  CL 103 07/16/2020  CO2 22 07/16/2020 Lab Results Component Value Date  CHOL 106 07/31/2019  LDL 51 07/31/2019  TRIG 67 07/31/2019  HDL 41 07/31/2019  Component    Latest Ref Rng & Units 07/21/2020 Hemoglobin A1c (External)    <5.7 % of total Hgb 5.8 (H) Lab Results Component Value Date HGBA1C 5.6 07/24/2019 Recent Studies:  XR Chest Obliques 03/17/23Order: [id number] ImpressionMinimal basilar interstitial thickening No acute infiltrateEncounter Diagnosis:    ICD-10-CM  1. Abdominal aortic aneurysm (AAA) without rupture, unspecified part (HC Code)  I71.40 EKG (Clinic Performed)   EKG  2. 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 Mixed hyperlipidemia  E78.2 EKG (Clinic Performed)   EKG  Impression and Plan: 88 year old male with known coronary artery disease status post remote history of an IMI by patient's history, longstanding, well controlled hypertension, status post a left carotid endarterectomy, with a history of a known abdominal aortic aneurysm?returns for follow-up.?Over the past 6 months, the patient has been stable from a cardiovascular standpoint without chest pain, syncope or palpitations. ?He does admit to progressive dyspnea on exertion.  He has not used nitroglycerin in quite some time. ?Taken today on exam, his blood pressure is well controlled, he is in sinus rhythm. ?The rest his cardiovascular exam is unremarkable. ??The patient underwent echocardiography 6 months ago that demonstrated normal left ventricular systolic function without significant valvular abnormalities.  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  I reviewed all the above in detail with the patient.  The patient is not interested in pursuing any further evaluation for his progressive dyspnea on exertion.  Therefore, I see no need to repeat his stress test.  He feels the dyspnea may be secondary to deconditioning and I encouraged the patient to possibly work with physical therapy to improve his gait and balance.  Otherwise, his blood pressure is well controlled and he is on appropriate secondary prevention medications.  I have asked him to return in 6 monthsScribed for [name]., MD by Dola Argyle, medical scribe Jul 01, 2021   The documentation recorded by the scribe accurately reflects the services I personally performed and the decisions made by me. I reviewed and confirmed all material entered and/or pre-charted by the scribe.  Signed: Charleston Ropes, MD.  For questions [phone]/4/20237:15 PM

## 2021-07-02 DIAGNOSIS — I10 Essential (primary) hypertension: Secondary | ICD-10-CM

## 2021-07-02 DIAGNOSIS — I252 Old myocardial infarction: Secondary | ICD-10-CM

## 2021-09-24 ENCOUNTER — Inpatient Hospital Stay: Admit: 2021-09-24 | Discharge: 2021-09-24 | Payer: PRIVATE HEALTH INSURANCE | Attending: Emergency Medicine

## 2021-09-24 ENCOUNTER — Emergency Department: Admit: 2021-09-24 | Payer: PRIVATE HEALTH INSURANCE | Primary: Internal Medicine

## 2021-09-24 ENCOUNTER — Telehealth: Admit: 2021-09-24 | Payer: PRIVATE HEALTH INSURANCE | Attending: Cardiovascular Disease | Primary: Internal Medicine

## 2021-09-24 DIAGNOSIS — Z888 Allergy status to other drugs, medicaments and biological substances status: Secondary | ICD-10-CM

## 2021-09-24 DIAGNOSIS — Z7902 Long term (current) use of antithrombotics/antiplatelets: Secondary | ICD-10-CM

## 2021-09-24 DIAGNOSIS — Z79899 Other long term (current) drug therapy: Secondary | ICD-10-CM

## 2021-09-24 DIAGNOSIS — Z7982 Long term (current) use of aspirin: Secondary | ICD-10-CM

## 2021-09-24 DIAGNOSIS — R079 Chest pain, unspecified: Secondary | ICD-10-CM

## 2021-09-24 LAB — CBC WITH AUTO DIFFERENTIAL
BKR WAM ABSOLUTE IMMATURE GRANULOCYTES.: 0.03 x 1000/ÂµL (ref 0.00–0.30)
BKR WAM ABSOLUTE LYMPHOCYTE COUNT.: 1.31 x 1000/ÂµL (ref 0.60–3.70)
BKR WAM ABSOLUTE NRBC (2 DEC): 0 x 1000/ÂµL (ref 0.00–1.00)
BKR WAM ANALYZER ANC: 2.84 x 1000/ÂµL (ref 2.00–7.60)
BKR WAM BASOPHIL ABSOLUTE COUNT.: 0.04 x 1000/ÂµL (ref 0.00–1.00)
BKR WAM BASOPHILS: 0.8 % (ref 0.0–1.4)
BKR WAM EOSINOPHIL ABSOLUTE COUNT.: 0.3 x 1000/ÂµL (ref 0.00–1.00)
BKR WAM EOSINOPHILS: 5.9 % — ABNORMAL HIGH (ref 0.0–5.0)
BKR WAM HEMATOCRIT (2 DEC): 37 % — ABNORMAL LOW (ref 38.50–50.00)
BKR WAM HEMOGLOBIN: 11.8 g/dL — ABNORMAL LOW (ref 13.2–17.1)
BKR WAM IMMATURE GRANULOCYTES: 0.6 % (ref 0.0–1.0)
BKR WAM LYMPHOCYTES: 25.9 % (ref 17.0–50.0)
BKR WAM MCH (PG): 23.4 pg — ABNORMAL LOW (ref 27.0–33.0)
BKR WAM MCHC: 31.9 g/dL (ref 31.0–36.0)
BKR WAM MCV: 73.3 fL — ABNORMAL LOW (ref 80.0–100.0)
BKR WAM MONOCYTE ABSOLUTE COUNT.: 0.53 x 1000/ÂµL (ref 0.00–1.00)
BKR WAM MONOCYTES: 10.5 % (ref 4.0–12.0)
BKR WAM MPV: 10.5 fL (ref 8.0–12.0)
BKR WAM NEUTROPHILS: 56.3 % (ref 39.0–72.0)
BKR WAM NUCLEATED RED BLOOD CELLS: 0 % (ref 0.0–1.0)
BKR WAM PLATELETS: 157 x1000/ÂµL (ref 150–420)
BKR WAM RDW-CV: 15.8 % — ABNORMAL HIGH (ref 11.0–15.0)
BKR WAM RED BLOOD CELL COUNT.: 5.05 M/ÂµL (ref 4.00–6.00)
BKR WAM WHITE BLOOD CELL COUNT: 5.1 x1000/ÂµL (ref 4.0–11.0)

## 2021-09-24 LAB — BASIC METABOLIC PANEL
BKR ANION GAP: 7 (ref 7–17)
BKR BLOOD UREA NITROGEN: 37 mg/dL — ABNORMAL HIGH (ref 8–23)
BKR BUN / CREAT RATIO: 19.5 (ref 8.0–23.0)
BKR CALCIUM: 8.9 mg/dL (ref 8.8–10.2)
BKR CHLORIDE: 110 mmol/L — ABNORMAL HIGH (ref 98–107)
BKR CO2: 24 mmol/L (ref 20–30)
BKR CREATININE: 1.9 mg/dL — ABNORMAL HIGH (ref 0.40–1.30)
BKR EGFR, CREATININE (CKD-EPI 2021): 32 mL/min/{1.73_m2} — ABNORMAL LOW (ref >=60)
BKR GLUCOSE: 106 mg/dL — ABNORMAL HIGH (ref 70–100)
BKR POTASSIUM: 4.8 mmol/L (ref 3.3–5.3)
BKR SODIUM: 141 mmol/L (ref 136–144)

## 2021-09-24 LAB — TROPONIN T HIGH SENSITIVITY, 1 HOUR WITH REFLEX (BH GH LMW YH)
BKR TROPONIN T HS 1 HOUR DELTA FROM 0 HOUR: -1 ng/L
BKR TROPONIN T HS 1 HOUR: 37 ng/L — ABNORMAL HIGH

## 2021-09-24 LAB — TROPONIN T HIGH SENSITIVITY, 3 HOUR (BH GH LMW YH)
BKR TROPONIN T HS 1 HOUR DELTA FROM 0 HOUR ON 3HR: -1 ng/L
BKR TROPONIN T HS 3 HOUR DELTA FROM 0 HOUR: -3 ng/L
BKR TROPONIN T HS 3 HOUR: 35 ng/L — ABNORMAL HIGH

## 2021-09-24 LAB — TROPONIN T HIGH SENSITIVITY, 0 HOUR BASELINE WITH REFLEX (BH GH LMW YH): BKR TROPONIN T HS 0 HOUR BASELINE: 38 ng/L — ABNORMAL HIGH

## 2021-09-24 LAB — HEPATIC FUNCTION PANEL
BKR A/G RATIO: 1.5 (ref 1.0–2.2)
BKR ALANINE AMINOTRANSFERASE (ALT): 17 U/L (ref 9–59)
BKR ALBUMIN: 4 g/dL (ref 3.6–4.9)
BKR ALKALINE PHOSPHATASE: 67 U/L (ref 9–122)
BKR ASPARTATE AMINOTRANSFERASE (AST): 22 U/L (ref 10–35)
BKR AST/ALT RATIO: 1.3
BKR BILIRUBIN DIRECT: 0.2 mg/dL (ref <=0.3)
BKR BILIRUBIN TOTAL: 0.8 mg/dL (ref <=1.2)
BKR GLOBULIN: 2.6 g/dL (ref 2.3–3.5)
BKR PROTEIN TOTAL: 6.6 g/dL (ref 6.6–8.7)

## 2021-09-24 LAB — LIPASE: BKR LIPASE: 32 U/L (ref 11–55)

## 2021-09-24 NOTE — Telephone Encounter
Message left with answering service

## 2021-09-24 NOTE — ED Notes
2:22 PM To ED for intermittent CP x1 month radiating to armpit, worsening today. Pt denies N/V/D, no abdominal pain, no HA/dizziness, no vision changes. Was seen at a walk in clinic today and sent here for EKG eval. Pt endorses DOE but denying CP/SOB while sitting on exam. Pt AAOX4, placed on CM, PIV placed, Labs pending EKG repeated.

## 2021-09-24 NOTE — ED Provider Notes
Chief Complaint Patient presents with ? Chest Pain   Intermittent CP/ left axilla x 1 month, then a few days ago pain became worse, starting in the back and radiating forward. +DOE. Tried to see his cardiologist today, but couldn't, so went to a walk-in and was sent here for further eval. Patient presents emergency room with intermittent left-sided chest pain.Ongoing for last month.Unable to see his PCP, was seen in urgent care transferred to the ER for evaluation.Patient has no discomfort at this time.Patient denies any fever cough hemoptysis, no abdominal pain flank pain dysuria frequency or hematuria.On examination patient is alert and orientated has no chest pain, comfortable, sinus rhythm on monitor, lungs clear heart regular abdomen is soft pulses symmetrical in the upper and lower extremities, lower extremities no evidence of DVT.EKG obtained, no ischemia.Differential includes ACS, pulmonary embolism, heart failure, pneumonia, pneumothorax, GERD.No current chest pain.  EKG nonischemic will obtain serial troponins.Low clinical suspicion for aortic dissection or pulmonary embolism.Serial troponins negative, while in the ER, no symptoms, no chest painMDM  Physical ExamED Triage Vitals [09/24/21 1405]BP: 106/60Pulse: 62Pulse from  O2 sat: n/aResp: 18Temp: 98 ?F (36.7 ?C)Temp src: n/aSpO2: 98 % BP 106/60  - Pulse 62  - Temp 98 ?F (36.7 ?C)  - Resp 18  - SpO2 98% Physical Exam ProceduresAttestation/Critical CareClinical Impressions as of 09/24/21 1809 Chest pain, unspecified type  ED DispositionDischarge Duffy Rhody, Bobbye Riggs, MD07/28/23 1810

## 2021-09-24 NOTE — ED Triage Note
Provider in Triage Note40 y.o. year old male  presents with intermittent CP for a month and last few days worsePhysical Exam: afebrile, speech clearOrders placed in triage: yesDisposition: Main Emergency Department for further evaluation.[name]7/28/2023 1:41 PM

## 2021-12-08 ENCOUNTER — Encounter: Admit: 2021-12-08 | Payer: PRIVATE HEALTH INSURANCE | Attending: Internal Medicine | Primary: Internal Medicine

## 2022-01-07 ENCOUNTER — Ambulatory Visit: Admit: 2022-01-07 | Payer: PRIVATE HEALTH INSURANCE | Attending: Cardiovascular Disease | Primary: Internal Medicine

## 2022-01-07 DIAGNOSIS — I10 Essential (primary) hypertension: Secondary | ICD-10-CM

## 2022-01-07 DIAGNOSIS — Z955 Presence of coronary angioplasty implant and graft: Secondary | ICD-10-CM

## 2022-01-07 DIAGNOSIS — I714 Abdominal aortic aneurysm, without rupture, unspecified: Secondary | ICD-10-CM

## 2022-01-07 MED ORDER — LOSARTAN 50 MG TABLET
50 mg | ORAL_TABLET | Freq: Every day | ORAL | 4 refills | Status: AC
Start: 2022-01-07 — End: ?

## 2022-01-07 NOTE — Progress Notes
 [facility] Cardiology Office VisitInterval History: This is a 88 y.o. male with a history of known coronary artery disease status post remote history of an IMI by patient's history,?cardiac stenting,?longstanding, well controlled hypertension, s/p?left carotid endarterectomy, with a history of a known abdominal aortic aneurysm (4.5cm) who returns for a 6 month follow up. He recently visited ED on 09/24/21 for intermittent left-sided chest pain x1 month, worsened 3 days prior to ED visit, starting in back and radiating forward. Diagnostic testing revealed negative results, labs within normal ranges. His influenza immunizations are up-to-date. He plans to receive the COVID-19 immunization.He is accompanied by his daughter. Today, the patient reports feeling well. He reports being worried about that status of his aortic aneurysm, and would like to have it checked. He is able to performed his activities of daily living independently. He denies experiencing any falls. He has safety bars installed in his shower. His daughter reports his appetite has been decreasing. He has lost some weight since his last visit. Clinically, he has been feeling well from a cardiovascular standpoint. He denies experiencing any chest pain or palpitations. No lightheadedness, shortness of breath, orthopnea or PND.  He denies experiencing any peripheral edema or claudication. No syncope, or loss of consciousness. He is compliant with his medications. He has been monitoring his salt intake.  Social History: He?lives with his daughter.? his wife passed away several months ago.His mother had CAD. He?has 3?daughters. 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 He drinks?2 cups coffee daily, no alcohol.?Review of Allergies/Medical History/Medications: Past Medical History: Diagnosis Date ? Chronic coronary artery disease  ? Hyperlipidemia  ? Hypertension Past Surgical History: Procedure Laterality Date ? APPENDECTOMY   ? BLADDER SURGERY  03/01/2019 ? CHOLECYSTECTOMY   ? FRACTURE SURGERY    rifgt hand Outpatient Medications Marked as Taking for the 01/07/22 encounter (Office Visit) with [name]., MD Medication Sig Dispense Refill ? amLODIPine (NORVASC) 2.5 mg tablet Take 1 tablet (2.5 mg total) by mouth daily. 30 tablet 3 ? aspirin 81 mg EC delayed release tablet Take 1 tablet (81 mg total) by mouth daily.   ? atorvastatin (LIPITOR) 80 mg tablet Take 1 tablet (80 mg total) by mouth daily.   ? b complex vitamins capsule Take 1 capsule by mouth daily.   ? carvediloL (COREG) 12.5 mg Immediate Release tablet TAKE 1 TABLET BY MOUTH  TWICE DAILY WITH BREAKFAST  AND DINNER 180 tablet 3 ? clopidogreL (PLAVIX) 75 mg tablet Take 1 tablet (75 mg total) by mouth daily.   ? gabapentin (NEURONTIN) 100 mg capsule Take 2 capsules (200 mg total) by mouth daily. hs   ? ipratropium (ATROVENT) 21 mcg (0.03 %) nasal spray USE 2 SPRAYS IN BOTH  NOSTRILS TWICE DAILY   ? isosorbide mononitrate (IMDUR) 30 mg 24 hr extended release tablet Take 1 tablet (30 mg total) by mouth daily.   ? losartan (COZAAR) 50 mg tablet Take 1 tablet (50 mg total) by mouth daily. 90 tablet 3 ? multivitamin tablet Take 1 tablet by mouth daily.   ? nitroGLYCERIN (NITROSTAT) 0.4 mg SL tablet Place 1 tablet (0.4 mg total) under the tongue every 5 (five) minutes as needed for chest pain. If no relief, call 911. May repeat every 5 minutes for a total of 3 tablets. 25 tablet 3 ? omeprazole (PRILOSEC) 20 mg capsule Take 1 capsule (20 mg total) by mouth daily.   Allergies Allergen Reactions ? Ace Inhibitors Cough Review of Systems: Review  of Systems Constitutional: Negative.  HENT: Negative.  Eyes: Negative.  Respiratory: Negative.  Cardiovascular: Negative.  Gastrointestinal: Negative.  Genitourinary: Negative. Musculoskeletal: Negative.  Skin: Negative.  Neurological: Negative.  Endo/Heme/Allergies: Negative.  Psychiatric/Behavioral: Negative.  All other systems reviewed and are negative.Physical Exam: Vitals:  01/07/22 1107 BP: (!) 118/52 Site: Left Arm Position: Sitting Cuff Size: Medium Pulse: 75 SpO2: 97% Weight: 96.2 kg Height: 5' 10 (1.778 m) PainSc:   0 - No pain BP (!) 118/52 (Site: l a, Position: Sitting, Cuff Size: Medium)  - Pulse 75  - Ht 5' 10 (1.778 m)  - Wt 96.2 kg  - SpO2 97%  - BMI 30.42 kg/m?  Manual BP: 106/60Physical ExamConstitutional:     General: He is not in acute distress.   Appearance: He is well-developed. Neck:    Vascular: Normal carotid pulses. No carotid bruit, hepatojugular reflux or JVD. Cardiovascular:    Rate and Rhythm: Normal rate and regular rhythm.    Heart sounds: Murmur (soft of aortic sclerosis) heard.    No friction rub. Pulmonary:    Effort: Pulmonary effort is normal.    Breath sounds: Normal breath sounds. No wheezing or rales. Abdominal:    General: Bowel sounds are normal.    Palpations: Abdomen is soft.    Tenderness: There is no abdominal tenderness. There is no rebound. Musculoskeletal:       General: Normal range of motion.    Cervical back: Normal range of motion and neck supple.    Right lower leg: No edema.    Left lower leg: No edema. Skin:   General: Skin is warm and dry. Neurological:    Mental Status: He is alert and oriented to person, place, and time. Psychiatric:       Behavior: Behavior normal. Review of Labs/Diagnostics: ECG/Tele Events: Independently interpreted by me:  Normal sinus rhythm, left axis deviation, no acute ST or T-wave changesEcho Findings: Results for orders placed or performed during the hospital encounter of 01/01/21 Echo 2D Complete w Doppler and CFI if Ind Image Enhancement 3D and or bubbles Result Value Ref Range  Reported Biplane EF% 62 %  Narrative   * Normal left ventricular size and systolic function. Mild concentric left ventricular hypertrophy.  LVEF calculated by biplane Simpson's was 62%.  Abnormal tissue Doppler suggestive of abnormal diastolic function.* Normal right ventricular cavity size and systolic function.  Estimated right ventricular systolic pressure is 27 mmHg.* Atria are normal in size* Aortic valve is trileaflet.  Mild aortic valve calcification.  Aortic sclerosis without stenosis.  Peak aortic velocity is 1.6 m/s.  Mean gradient is 6 mmHg.  Dimensionless index is 0.70.  Trace aortic regurgitation.* Mitral valve appears calcified.  Mild anterior and moderate posterior mitral annular calcification.  No mitral regurgitation.  No mitral stenosis.* IVC diameter < 2.1 cm that collapses > 50% with a sniff suggests normal RAP (0-5 mmHg, mean 3 mmHg).* No significant pericardial effusion* No prior study available for comparison Stress Test: No results found for this or any previous visit.Lab Review:  Ref Range & Units 05/14/21 ?1:09 PM Glucose External 65 - 139 mg/dL 295  Comment:  ?? ? ? Non-fasting reference interval Blood Urea Nitrogen (BUN) External 7 - 25 mg/dL 35?High?  Creatinine External 0.70 - 1.22 mg/dL 6.21?High?  Creatinine w/ eGFR > OR = 60 mL/min/1.74m2 33?Low?  Comment: The eGFR is based on the CKD-EPI 2021 equation. To calculate the new eGFR from a previous Creatinine or Cystatin C result, go to https://www.kidney.org/professionals/  kdoqi/gfr%5Fcalculator BUN/Creatinine Ratio 6 - 22 (calc) 19  Sodium External 135 - 146 mmol/L 138  Potassium External 3.5 - 5.3 mmol/L 4.7  Chloride External 98 - 110 mmol/L 104  CO2 External 20 - 32 mmol/L 28  Calcium External 8.6 - 10.3 mg/dL 8.9  Lab Results Component Value Date  CREATININE 1.90 (H) 09/24/2021  BUN 37 (H) 09/24/2021  NA 141 09/24/2021  K 4.8 09/24/2021 CL 110 (H) 09/24/2021  CO2 24 09/24/2021 Lab Results Component Value Date  CHOL 106 07/31/2019  LDL 51 07/31/2019  TRIG 67 07/31/2019  HDL 41 07/31/2019  Component    Latest Ref Rng & Units 07/21/2020 Hemoglobin A1c (External)    <5.7 % of total Hgb 5.8 (H) Lab Results Component Value Date  HGBA1C 5.6 07/24/2019 Recent Studies:  XR Chest PA and Lateral 07/28/23INDICATION: chest painCOMPARISON: XR CHEST PA AND LATERAL 2019-09-29?FINDINGS:?? ?No focal pulmonary opacity, pleural effusion, or pneumothorax. ?Cardiomediastinal silhouette is within normal limits and similar to prior.?No acute osseous injuries. Similar flowing bridging osteophyte across the anterior aspect of multiple adjacent thoracic vertebral bodies, consistent with diffuse idiopathic skeletal hyperostosis (DISH).?IMPRESSION: No acute cardiothoracic abnormality.?[facility] Notify System Classification: RoutineReport initiated by:  Alease Medina, MDReported and signed by: Virl Axe, MD [facility] Radiology and Biomedical ImagingEncounter Diagnosis:    ICD-10-CM  1. History of coronary artery stent placement  Z95.5 EKG (Clinic Performed)   US Abdominal Aorta Limited ([facility])  2. Essential hypertension  I10 EKG (Clinic Performed)   US Abdominal Aorta Limited ([facility])  3. Hx of myocardial infarction  I25.2 EKG (Clinic Performed)   US Abdominal Aorta Limited ([facility])  4. Abdominal aortic aneurysm (AAA) without rupture, unspecified part (HC Code)  I71.40 US Abdominal Aorta Limited ([facility])  Impression and Plan: 88 year old male with known coronary artery disease status post remote history of an IMI by patient's history, longstanding, well controlled hypertension, status post a left carotid endarterectomy, with a history of a known abdominal aortic aneurysm?returns for follow-up.?Over the past 6 months, the patient has been stable from a cardiovascular standpoint without chest pain, syncope or palpitations. ?He does admit to progressive dyspnea on exertion.  He has not used nitroglycerin in quite some time. ? today on exam, his blood pressure is well controlled, he is in sinus rhythm. ?The rest his cardiovascular exam is  Notable for systolic ejection murmur along left sternal border consistent with aortic sclerosis.  There is no peripheral edema.  His EKG is unchanged.  ??The patient underwent echocardiography   Last year that demonstrated normal left ventricular systolic function without significant valvular abnormalities.  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   the patient is concerned about his abdominal aortic aneurysm and I will have him return for a limited abdominal ultrasound to re-evaluate the size.  In the past it had measured 4.5 centimeters by [place] scanning. the patient has regular follow-up with his PCP.  I have asked him to return in 6 monthsScribed for [name], MD by Gearldine Shown, medical scribe January 07, 2022  The documentation recorded by the scribe accurately reflects the services I personally performed and the decisions made by me. I reviewed and confirmed all material entered and/or pre-charted by the scribe. Signed: Charleston Ropes, MD.  For questions [phone]/10/20237:15 PM

## 2022-01-08 DIAGNOSIS — I252 Old myocardial infarction: Secondary | ICD-10-CM

## 2022-01-25 ENCOUNTER — Inpatient Hospital Stay: Admit: 2022-01-25 | Discharge: 2022-01-25 | Payer: PRIVATE HEALTH INSURANCE | Primary: Internal Medicine

## 2022-01-25 DIAGNOSIS — I252 Old myocardial infarction: Secondary | ICD-10-CM

## 2022-01-25 DIAGNOSIS — Z955 Presence of coronary angioplasty implant and graft: Secondary | ICD-10-CM

## 2022-01-25 DIAGNOSIS — I10 Essential (primary) hypertension: Secondary | ICD-10-CM

## 2022-01-25 DIAGNOSIS — I714 Abdominal aortic aneurysm, without rupture, unspecified: Secondary | ICD-10-CM

## 2022-01-27 NOTE — Other
Called pt; no answer LVMM to call back for results

## 2022-01-28 ENCOUNTER — Telehealth: Admit: 2022-01-28 | Payer: PRIVATE HEALTH INSURANCE | Attending: Cardiovascular Disease | Primary: Internal Medicine

## 2022-01-28 NOTE — Telephone Encounter
Called and LVm for this patient to call office back for MD message. Number provided.

## 2022-01-28 NOTE — Other
Called and LVM for this patient to call office back for message from MD. Number provided.

## 2022-01-31 NOTE — Other
Spoke to patient; relayed MD message; pt stated last time his aorta was 4.5 and now it's 4.8Would like to know if anything else was foundPlease advise

## 2022-01-31 NOTE — Telephone Encounter
Mark [name]., MDto [facility]  JN? 01/25/22 ?5:55 PMCall pt No change in the size of the aorta

## 2022-01-31 NOTE — Telephone Encounter
Pt verbalized understanding.

## 2022-01-31 NOTE — Telephone Encounter
Pt returning call. Please call back.

## 2022-02-10 ENCOUNTER — Encounter: Admit: 2022-02-10 | Payer: PRIVATE HEALTH INSURANCE | Attending: Internal Medicine | Primary: Internal Medicine

## 2022-02-10 DIAGNOSIS — M48062 Spinal stenosis, lumbar region with neurogenic claudication: Secondary | ICD-10-CM

## 2022-03-01 ENCOUNTER — Encounter: Admit: 2022-03-01 | Payer: PRIVATE HEALTH INSURANCE | Attending: Cardiovascular Disease | Primary: Internal Medicine

## 2022-03-02 MED ORDER — CARVEDILOL IMMEDIATE RELEASE 12.5 MG TABLET
12.5 mg | ORAL_TABLET | 4 refills | Status: AC
Start: 2022-03-02 — End: ?

## 2022-04-07 ENCOUNTER — Telehealth: Admit: 2022-04-07 | Payer: PRIVATE HEALTH INSURANCE | Attending: Urology | Primary: Internal Medicine

## 2022-04-07 ENCOUNTER — Inpatient Hospital Stay: Admit: 2022-04-07 | Discharge: 2022-04-07 | Payer: PRIVATE HEALTH INSURANCE | Attending: Emergency Medicine

## 2022-04-07 ENCOUNTER — Telehealth: Admit: 2022-04-07 | Payer: PRIVATE HEALTH INSURANCE | Attending: Cardiovascular Disease | Primary: Internal Medicine

## 2022-04-07 ENCOUNTER — Emergency Department: Admit: 2022-04-07 | Payer: PRIVATE HEALTH INSURANCE | Attending: Diagnostic Radiology | Primary: Internal Medicine

## 2022-04-07 DIAGNOSIS — R338 Other retention of urine: Secondary | ICD-10-CM

## 2022-04-07 DIAGNOSIS — N183 Chronic kidney disease, stage 3 unspecified: Secondary | ICD-10-CM

## 2022-04-07 DIAGNOSIS — I2510 Atherosclerotic heart disease of native coronary artery without angina pectoris: Secondary | ICD-10-CM

## 2022-04-07 DIAGNOSIS — R109 Unspecified abdominal pain: Secondary | ICD-10-CM

## 2022-04-07 DIAGNOSIS — I129 Hypertensive chronic kidney disease with stage 1 through stage 4 chronic kidney disease, or unspecified chronic kidney disease: Secondary | ICD-10-CM

## 2022-04-07 DIAGNOSIS — Z8679 Personal history of other diseases of the circulatory system: Secondary | ICD-10-CM

## 2022-04-07 DIAGNOSIS — Z888 Allergy status to other drugs, medicaments and biological substances status: Secondary | ICD-10-CM

## 2022-04-07 DIAGNOSIS — N401 Enlarged prostate with lower urinary tract symptoms: Secondary | ICD-10-CM

## 2022-04-07 LAB — URINALYSIS WITH CULTURE REFLEX      (BH LMW YH)
BKR BILIRUBIN, UA: NEGATIVE
BKR CALCIUM: 1.018 g/dL (ref 1.005–1.030)
BKR GLUCOSE, UA: NEGATIVE
BKR LEUKOCYTE ESTERASE, UA: NEGATIVE % — ABNORMAL HIGH (ref 0.0–5.0)
BKR NITRITE, UA: NEGATIVE
BKR PH, UA: 6 % — ABNORMAL HIGH (ref 5.5–7.5)
BKR SPECIFIC GRAVITY, UA: 1.018 (ref 1.005–1.030)
BKR UROBILINOGEN, UA (MG/DL): <2 mg/dL (ref 0.0–<=2.0)

## 2022-04-07 LAB — URINE MICROSCOPIC     (BH GH LMW YH)
BKR HYALINE CASTS, UA INSTRUMENT (NUMERIC): 1 /LPF — ABNORMAL LOW (ref 0–3)
BKR RBC/HPF INSTRUMENT: 37 /HPF — ABNORMAL HIGH (ref 0–2)
BKR WBC/HPF INSTRUMENT: 2 /HPF (ref 0–5)

## 2022-04-07 LAB — HEPATIC FUNCTION PANEL
BKR A/G RATIO: 1.5 (ref 1.0–2.2)
BKR ALANINE AMINOTRANSFERASE (ALT): 31 U/L (ref 9–59)
BKR ALBUMIN: 4 g/dL (ref 3.6–4.9)
BKR ALKALINE PHOSPHATASE: 66 U/L (ref 9–122)
BKR ASPARTATE AMINOTRANSFERASE (AST): 43 U/L — ABNORMAL HIGH (ref 10–35)
BKR AST/ALT RATIO: 1.4
BKR BILIRUBIN DIRECT: 0.2 mg/dL (ref <=0.3)
BKR BILIRUBIN TOTAL: 1.2 mg/dL (ref <=1.2)
BKR GLOBULIN: 2.6 g/dL (ref 2.3–3.5)
BKR PROTEIN TOTAL: 6.6 g/dL (ref 6.6–8.7)

## 2022-04-07 LAB — CBC WITH AUTO DIFFERENTIAL
ALPHA-SARCOGLYCANOPATHY: 0.53 x 1000/??L (ref 0.00–1.00)
BKR KETONES, UA: 63.4 % (ref 39.0–72.0)
BKR WAM ABSOLUTE IMMATURE GRANULOCYTES.: 0.01 x 1000/??L (ref 0.00–0.30)
BKR WAM ABSOLUTE LYMPHOCYTE COUNT.: 0.89 x 1000/ÂµL (ref 0.60–3.70)
BKR WAM ABSOLUTE NRBC (2 DEC): 0 x 1000/ÂµL (ref 0.00–1.00)
BKR WAM ANALYZER ANC: 3.2 x 1000/ÂµL (ref 2.00–7.60)
BKR WAM BASOPHIL ABSOLUTE COUNT.: 0.04 x 1000/ÂµL (ref 0.00–1.00)
BKR WAM BASOPHILS: 0.8 % (ref 0.0–1.4)
BKR WAM EOSINOPHIL ABSOLUTE COUNT.: 0.38 x 1000/ÂµL (ref 0.00–1.00)
BKR WAM EOSINOPHILS: 7.5 % — ABNORMAL HIGH (ref 0.0–5.0)
BKR WAM HEMATOCRIT (2 DEC): 37.7 % — ABNORMAL LOW (ref 38.50–50.00)
BKR WAM HEMOGLOBIN: 12.2 g/dL — ABNORMAL LOW (ref 13.2–17.1)
BKR WAM IMMATURE GRANULOCYTES: 0.2 % (ref 0.0–1.0)
BKR WAM LYMPHOCYTES: 17.6 % (ref 17.0–50.0)
BKR WAM MCH (PG): 24.4 pg — ABNORMAL LOW (ref 27.0–33.0)
BKR WAM MCHC: 32.4 g/dL (ref 31.0–36.0)
BKR WAM MONOCYTE ABSOLUTE COUNT.: 0.53 x 1000/ÂµL (ref 0.00–1.00)
BKR WAM MONOCYTES: 10.5 % (ref 4.0–12.0)
BKR WAM MPV: 10.5 fL (ref 8.0–12.0)
BKR WAM NEUTROPHILS: 63.4 % (ref 39.0–72.0)
BKR WAM PLATELETS: 181 x1000/ÂµL (ref 150–420)
BKR WAM RDW-CV: 15.8 % — ABNORMAL HIGH (ref 11.0–15.0)
BKR WAM RED BLOOD CELL COUNT.: 5 M/ÂµL (ref 4.00–6.00)
BKR WAM WHITE BLOOD CELL COUNT: 5.1 x1000/ÂµL (ref 4.0–11.0)

## 2022-04-07 LAB — BASIC METABOLIC PANEL
BKR ANION GAP: 9 g/dL — ABNORMAL LOW (ref 7–17)
BKR BLOOD UREA NITROGEN: 29 mg/dL — ABNORMAL HIGH (ref 8–23)
BKR BUN / CREAT RATIO: 16.5 (ref 8.0–23.0)
BKR CHLORIDE: 108 mmol/L — ABNORMAL HIGH (ref 98–107)
BKR CO2: 24 mmol/L (ref 20–30)
BKR CREATININE: 1.76 mg/dL — ABNORMAL HIGH (ref 0.40–1.30)
BKR EGFR, CREATININE (CKD-EPI 2021): 35 mL/min/{1.73_m2} — ABNORMAL LOW (ref >=60)
BKR GLUCOSE: 123 mg/dL — ABNORMAL HIGH (ref 70–100)
BKR POTASSIUM: 4.3 mmol/L (ref 3.3–5.3)
BKR SODIUM: 141 mmol/L (ref 136–144)
BKR WAM MCV: 29 mg/dL — ABNORMAL HIGH (ref 8–23)

## 2022-04-07 LAB — UA REFLEX CULTURE

## 2022-04-07 LAB — LIPASE: BKR LIPASE: 23 U/L (ref 11–55)

## 2022-04-07 MED ORDER — TAMSULOSIN 0.4 MG CAPSULE
0.4 mg | ORAL_CAPSULE | Freq: Every evening | ORAL | 1 refills | Status: AC
Start: 2022-04-07 — End: 2022-04-12

## 2022-04-07 NOTE — Discharge Instructions
Call your cardiologist on the next business day to arrange follow up regarding your aortic aneurysm.

## 2022-04-07 NOTE — Telephone Encounter
Mark [name] Pikestient daughter called to schedule her father a follow up appointment . Patient was seen in the ED today for urinary retention and was discharged with a foley. Advised [name] Pikes a scheduler will call her back with a date and time. She understood with no further questions. Sent cc uro e-mail

## 2022-04-07 NOTE — ED Notes
9:10 AM 16 french foley catheter placed per verbal order from MD.  725cc optimal urine output.  Secured to leg via stat-lock.  Urine sample obtained & sent.11:29 AM Pt cleared for discharge.  Discharge papers and instructions provided to pt & daughter, both verbalized understanding.  PIV removed, vitals obtained within the hour.  Pt brought to waiting room via wheelchair by this RN.

## 2022-04-07 NOTE — ED Provider Notes
Chief Complaint Patient presents with ? Abdominal Pain   c/o abdominal pain x2 days and increased urination. +lower abd pain and radiates to his side, has hx of CKD stage 3. Also has hx of aneurysm. A&OX4. EKG WNL per EMS. -CP -SOB.  Mark [name] is a 89 year old gentleman, history including CAD, AAA, CKD, BPH, brought in by EMS with his daughter for evaluation of abdominal pain.  The patient reports the pain began yesterday and he was not able to sleep very well.  No trauma.  No fevers or cough.  No chest pain or shortness of breath.  No vomiting.  Some loose stools.  Urinated over 10 times yesterday.  Has never had this pain before.[id number] bedside ultrasound for aorta views, but unable to obtain secondary to habitus and gas.  Of note, found large urinary bladder without signs of hydronephrosis.  Suspect urinary retention as cause of abdominal pain.  Will place Foley catheter and re-evaluate pain.  Will check BMP for electrolytes and creatinine, CBC for inflammatory markers and anemia.  Will check HFP for hepatobiliary disease and lipase for pancreatitis given abdominal pain.  Dispo pending workup and re-evaluation. ED Course:- 8:59 AM: >750 cc of yellow urine on Foley placement.  Patient reports resolution of pain, resting comfortably.- 11:07 AM:  Workup unrevealing for acute emergency.  Patient without pain after Foley.  Foley draining clear yellow urine.  Patient would prefer to have a new urologist, so referral sent.  Tamsulosin ordered per acute urinary retention pathway, instructed to take at bedside.  Pharmacy confirmed.  Patient noted some pain to buttock, stage I ulcer dressed by [facility] [place].  Instructed to follow up with primary care physician and cardiologist regarding abdominal aortic aneurism.  Strict return precautions provided.  All questions answered.Mark [name], MDEmergency Medicine Resident, PGY-42/09/2022 8:32 AM Physical ExamED Triage Vitals [04/07/22 0809]BP: (!) 185/88Pulse: 72Pulse from  O2 sat: n/aResp: 20Temp: 97.3 ?F (36.3 ?C)Temp src: TemporalSpO2: 100 % BP (!) 161/71  - Pulse 68  - Temp 98 ?F (36.7 ?C) (Oral)  - Resp 18  - Wt 96.2 kg  - SpO2 100%  - BMI 30.42 kg/m? Physical ExamConstitutional:     Comments: Uncomfortable appearing man sitting up in stretcher able to sit still Cardiovascular:    Rate and Rhythm: Normal rate. Pulmonary:    Effort: Pulmonary effort is normal. No respiratory distress.    Breath sounds: Normal breath sounds. No wheezing, rhonchi or rales. Abdominal:    Palpations: Abdomen is soft.    Tenderness: There is abdominal tenderness (Suprapubic tenderness). There is no guarding or rebound. Skin:   General: Skin is warm and dry.  ProceduresAttestation/Critical CarePatient Reevaluation: Attending Supervised: ResidentI saw and examined the patient. I agree with the findings and plan of care as documented in the resident's note except as noted below. Additional acute and/or chronic problems addressed: Mark [name] [id number] with PMH of HTN, HLD, chronic CAD, AAA, s/p appy, s/p cholecystectomy, who presents to the ED with abdominal pain x 2 days.  He complains of increased urinary frequency with decreased UOP; despite triage, the patient denies any increased UOP.  He states he has previously been diagnosed with an enlarged prostate, had a biopsy performed by urologist that he does not want to follow up with it this time.  The patient currently denies any chest pain, palpitations, shortness breath, back or flank or pelvic pain, trauma or falls, fever or chills.  He denies any hematuria.  The patient is accompanied to the ED by  his daughter, who helps relate portions of the history,BP (!) 185/88  - Pulse 72  - Temp 97.3 ?F (36.3 ?C) (Temporal)  - Resp 20  - Wt 96.2 kg  - SpO2 100%  - BMI 30.42 kg/m? Gen: NAD, resting comfortably on a stretcherCV: RRRRespir: CTA b/l; no r/r/wAbd: soft, no rebound; (+) suprapubic tenderness to palpation abdominal distentionMDM/PLAN:-- consider urinary retention, will place Foley catheter -- consider UTI, will obtain UA, labs -- bedside ultrasound difficult secondary to body habitus, however given findings of urinary retention on ultrasound acute aortic injury less likely Mark [name], MDFoley catheter placed with urine output as per ED RN.  Abdominal distention now resolved, abdominal pain resolved.  On re-evaluation, the patient's abdomen is benign, nontender, nondistended.The patient, in his daughter per his request, we counseled about the importance of following up with the urologist for possible void trial.  They were counseled about how to best care for Foley catheter.The patient's prior imaging was reviewed, including his outpatient aortic ultrasound which was notable for a slight increase in his infrarenal aorta from 4.5 to 4.7 centimeters; there is documentation in the chart that his cardiology was aware of the increase and has followed up with the patient; the patient states that his cardiologist is managing his aortic aneurysms.  The patient, in his daughter were counseled follow-up with his primary care physician in his cardiologist for re-evaluation of his known aortic aneurysms.  He was counseled that he will require vascular surgeon with continued growth was aneurism.Slight elevation in AST, otherwise LFTs within normal limits, lipase within normal limits Creatinine 1.76, baseline 1.8 - 2No significant leukocytosis or left shiftHematocrit 37.7%, relatively stable from prior UA with 37 RBCs, 2 WBCs; doubt UTIPatient informed about all imaging and laboratory study results.  Counseled to follow-up with Primary Care concerning all relayed incidental findings.The patient was educated about urinary retention in the importance of following up with the urologist.  He will be given the number for urology follow up as he does not want to follow up with his prior urologist.  He was again counseled about all noted laboratory findings in the importance of following up with his cardiologist and primary care physician.No need for admission to the hospital this time, the patient is stable for discharge.  He (in his daughter) were given strict return precautions.  The patient was counseled follow-up with primary care physician and cardiologist in 1 to 2 days for close re-evaluation.  He was counseled follow-up with urology as recommended for close outpatient follow-up in 4 Foley catheter removal trial.  He was counseled to continue using his Foley catheter until re-evaluated by his primary care physician and/or urologist for his void trial.  He was instructed return to the emergency department immediately with any new or worsened abdominal pain or distention, back or flank or pelvic pain, blood in his urine, nausea or vomiting, inability eat or drink, chest pain or palpitations, shortness of breath or trouble breathing, fevers or chills, or any other concerns.  Patient understands and agrees with discharge instructions, all his questions were answered The patient was discharged in the care of his daughter.  Temp:  (97.3 ?F (36.3 ?C)-98 ?F (36.7 ?C)) 98 ?F (36.7 ?C)Pulse:  (68-72) 68Resp:  (18-20) 18BP: (161-185)/(71-88) 161/71SpO2:  (100 %) 100 %Device (Oxygen Therapy): room airClinical Impressions as of 04/07/22 1729 Acute urinary retention  ED DispositionDischarge Mark [name], MDResident02/08/24 [address] Aime Carreras, MD02/08/24 1729

## 2022-04-07 NOTE — ED Notes
Pt report received from EMS. As per report the pt sts she is having intense 8/10 L abd pain at this time. Pt sts she feels as if his urination and increased recently and currently feels as if his bladder is about to explode. Pt has hx of aortic aneurysm approx 4.67mm with stage 3 CKD. PT currently A&Ox4 with a GCS of 15. PT denies any CP as well as SOB. Pt currently on stretcher awaiting further evaluation at this time. Chief Complaint Patient presents with  Abdominal Pain   c/o abdominal pain x2 days and increased urination. +lower abd pain and radiates to his side, has hx of CKD stage 3. Also has hx of aneurysm. A&OX4. EKG WNL per EMS. -CP -SOB.  Past Medical History: Diagnosis Date  Chronic coronary artery disease   Hyperlipidemia   Hypertension

## 2022-04-07 NOTE — Telephone Encounter
VM from dtr, Mark [name], requesting call back. Pt was seen in ED today and ED physician told pt to f/u with cardiologist. TC to Mark [name] returning her message. Pt seen in ED this morning for abdominal pain and frequent urination, found to have acute retention. Foley placed, drained 750cc urine with resolution of abdominal pain. Pt d/cd with foley x 1 week with o/p urology f/u.Mark [name] states that ED physician who performed bedside ultrasound told her to f/u with Dr. Frutoso Chase because [facility] office had been trying to reach pt re: his aneurism and results from recent u/s.Per chart review, ED note states11:07 AM:  Workup unrevealing for acute emergency.  Patient without pain after Foley.  Foley draining clear yellow urine.  Patient would prefer to have a new urologist, so referral sent.  Tamsulosin ordered per acute urinary retention pathway, instructed to take at bedside.  Pharmacy confirmed.  Patient noted some pain to buttock, stage I ulcer dressed by [facility] [place].  Instructed to follow up with primary care physician and cardiologist regarding abdominal aortic aneurism.  Strict return precautions provided.  All questions answered.Per further chart review advised Mark [name] that pt had u/s of aorta on 01/25/22 showing infrarenal abdominal aortic aneurysm measuring 4.8 cm x 4.7 cm in maximum dimensions with No significant change compared to prior study form 10/28/2019. Follow up result message 01/25/22 from [name]., MD to [facility] stated Call pt. No change in the size of the aorta. Two messages left for pt but pt was eventually contacted 01/31/22 with these results.  She acknowledged understanding and requested that she be contacted as primary in the future because pt is HOH. No follow up needed at this time.

## 2022-04-11 ENCOUNTER — Telehealth: Admit: 2022-04-11 | Payer: PRIVATE HEALTH INSURANCE | Attending: Urology | Primary: Internal Medicine

## 2022-04-11 NOTE — Telephone Encounter
Contacted the home care nurse [name] in regards to his call. Mark [name] stated there were some red flakes in the foley catheter and some dried blood around the tip of the penis. Mark [name] did say that he has a difficult time pulling down his pants so the catheter may be pulling, stat lock is in place. [name] informed patient to increase fluid intake and he will follow up with [name] PA tomorrow at appointment to discuss. Mark [name] stated he has no fever no back pain or other symptoms at this time. Mark [name] is requesting for the patient to get more leg bags at appointment tomorrow.

## 2022-04-11 NOTE — Telephone Encounter
[facility] MESSAGETime of call:   12:55 PMCaller: [name] C./ Engineer, mining at [facility] for call:  Visited patient today and noticed red flakes in the patient's foley bag and dried blood around the penis. Patient reports no pain but leg weakness. Ephriam Knuckles would like a return call if necessary. Does caller request to speak to someone urgently?  NoProvider: [name] Last clinic visit date: 7/1/22Best telephone number for callback:  [phone] time to return call (encourage patient to be available for callback):  Anytime before 4:30Permission to leave message:  [facility] Specialist

## 2022-04-12 ENCOUNTER — Ambulatory Visit: Admit: 2022-04-12 | Payer: PRIVATE HEALTH INSURANCE | Attending: Urology | Primary: Internal Medicine

## 2022-04-12 DIAGNOSIS — N401 Enlarged prostate with lower urinary tract symptoms: Secondary | ICD-10-CM

## 2022-04-12 DIAGNOSIS — R339 Retention of urine, unspecified: Secondary | ICD-10-CM

## 2022-04-12 DIAGNOSIS — N138 Other obstructive and reflux uropathy: Secondary | ICD-10-CM

## 2022-04-12 MED ORDER — TAMSULOSIN 0.4 MG CAPSULE
0.4 mg | ORAL_CAPSULE | Freq: Every evening | ORAL | 4 refills | Status: AC
Start: 2022-04-12 — End: ?

## 2022-04-12 NOTE — Progress Notes
New Patient NoteHPIJames [name] is a 89 y.o. male followed by Dr. Pleas Patricia for bladder wall thickening.  He had a cystoscopy 10/2020 that was unrevealing.He was seen in the ER 04/07/22 with abdominal pain.  A foley was placed for >750cc.  He was started on Flomax.He presents today for a telemedicine visit.He has been doing OK with the foley in place.  There has been some sediment in the tubing.  No pain or discomfort.  He has been taking Flomax nightly and increasing his fluid intake.  Daughter reports that he has issues with dehydration and has been drinking Pedilyte as well.Past Medical HistoryPast Medical History: Diagnosis Date ? Chronic coronary artery disease  ? Hyperlipidemia  ? Hypertension  Past Surgical HistoryPast Surgical History: Procedure Laterality Date ? APPENDECTOMY   ? BLADDER SURGERY  03/01/2019 ? CHOLECYSTECTOMY   ? FRACTURE SURGERY    rifgt hand AllergiesAllergies Allergen Reactions ? Ace Inhibitors Cough MedicationsOutpatient Encounter Medications as of 04/12/2022 Medication Sig Dispense Refill ? amLODIPine (NORVASC) 2.5 mg tablet Take 1 tablet (2.5 mg total) by mouth daily. 30 tablet 3 ? aspirin 81 mg EC delayed release tablet Take 1 tablet (81 mg total) by mouth daily.   ? atorvastatin (LIPITOR) 80 mg tablet Take 1 tablet (80 mg total) by mouth daily.   ? b complex vitamins capsule Take 1 capsule by mouth daily.   ? carvediloL (COREG) 12.5 mg Immediate Release tablet TAKE 1 TABLET BY MOUTH TWICE  DAILY WITH BREAKFAST AND DINNER 180 tablet 3 ? clopidogreL (PLAVIX) 75 mg tablet Take 1 tablet (75 mg total) by mouth daily.   ? gabapentin (NEURONTIN) 100 mg capsule Take 2 capsules (200 mg total) by mouth daily. hs   ? ipratropium (ATROVENT) 21 mcg (0.03 %) nasal spray USE 2 SPRAYS IN BOTH  NOSTRILS TWICE DAILY   ? isosorbide mononitrate (IMDUR) 30 mg 24 hr extended release tablet Take 1 tablet (30 mg total) by mouth daily.   ? losartan (COZAAR) 50 mg tablet Take 1 tablet (50 mg total) by mouth daily. 90 tablet 3 ? multivitamin tablet Take 1 tablet by mouth daily.   ? nitroGLYCERIN (NITROSTAT) 0.4 mg SL tablet Place 1 tablet (0.4 mg total) under the tongue every 5 (five) minutes as needed for chest pain. If no relief, call 911. May repeat every 5 minutes for a total of 3 tablets. 25 tablet 3 ? omeprazole (PRILOSEC) 20 mg capsule Take 1 capsule (20 mg total) by mouth daily.   ? tamsulosin (FLOMAX) 0.4 mg 24 hr capsule Take 1 capsule (0.4 mg total) by mouth nightly. 90 capsule 3 ? [DISCONTINUED] tamsulosin (FLOMAX) 0.4 mg 24 hr capsule Take 1 capsule (0.4 mg total) by mouth nightly. 14 capsule 0 No facility-administered encounter medications on file as of 04/12/2022.  Social HistorySocial History Tobacco Use ? Smoking status: Former ? Smokeless tobacco: Never Vaping Use ? Vaping Use: Never used Substance Use Topics ? Alcohol use: Not Currently ? Drug use: Never  Family History Family History Problem Relation Age of Onset ? Heart disease Mother  Review of Systems:No symptoms other than those noted in HPI and PMH.  All other systems negative.Physical ExamThere were no vitals taken for this visit.Lab Results Component Value Date  UCOLOR yellow 11/11/2020  UGLUCOSE Negative 11/11/2020  UKETONE Negative 11/11/2020  USPECGRAVITY 1.020 11/11/2020  UPROTEIN Trace 11/11/2020  UNITRATES Negative 11/11/2020  UBLOOD 1+ 11/11/2020  ULEUKOCYTES Negative 11/11/2020   No results found for: PVRPOCTPhysical Exam Constitutional: Appears well-developed and well-nourished. Head: Normocephalic.Neurological:  Alert and oriented to person, place, and time. Psychiatric: Has a normal mood and affect. Behavior is normal. Judgment and thought content normal. Examination limited by telemedicine.Assessment & Plan:Marcial Wesche is a 89 y.o. male with urinary retention likely secondary to BPH with obstructionEncounter Diagnoses Name SNOMED [place](R) Primary? ? Urinary retention RETENTION OF URINE Yes ? 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 For this visit the clinician and patient were present via interactive audio & video telecommunications system that permits real-time communications, via the [employer].Patient's use of the telehealth platform followed consent and acknowledges agreement to permit telehealth for this visit. State patient is located in: [place]The clinician is appropriately licensed in the above state to provide care for this visit. Other individuals present during the telehealth encounter and their role/relation: noneBecause this visit was completed over video, a hands-on physical exam was not performed. Patient/parent or guardian understands and knows to call back if condition changes.

## 2022-04-13 NOTE — Telephone Encounter
Mark [name] caDarl Pikesed in, she is supposed to come get leg bags today but before doing so she would like to speak with nursing. Pt is complaining of burning, when he moves it hurts, and the penis is red. Please call [name] to discuss #[phone]

## 2022-04-13 NOTE — Telephone Encounter
Contacted and spoke with [name] pt daughter. Relayed J. Pielech's message: He can try bacitracin to the tip of the penis for the rubbing discomfort.'

## 2022-04-13 NOTE — Telephone Encounter
Contacted and spoke to pt and his daughter [name]. Per pain he thinks the foley catheter is rubbing on the tip of his penis causing him pain and discomfort. Pt offered to have catheter checked. Pt declined because movement causes too much pain at tip of penis. Pt verbalized he as a visiting nurse coming to see him tomorrow.pt asking for medication to help ease pain. Will route note to provider for advice.

## 2022-04-18 ENCOUNTER — Telehealth: Admit: 2022-04-18 | Payer: PRIVATE HEALTH INSURANCE | Attending: Urology | Primary: Internal Medicine

## 2022-04-18 NOTE — Telephone Encounter
Spoke to [employer] home care nurse Ephriam Knuckles and let him know that he does not need to irrigate catheter. I informed him the patient will be coming in for a VT on Wednesday so just make sure the catheter stays clean, patent, and is draining fine. [name] understood and had no further questions.

## 2022-04-18 NOTE — Telephone Encounter
[name] the home care nurse looking to find out if there are orders for cath care , irrigation , cleaning changing , please call to discuss

## 2022-04-20 ENCOUNTER — Ambulatory Visit: Admit: 2022-04-20 | Payer: PRIVATE HEALTH INSURANCE | Attending: Urology | Primary: Internal Medicine

## 2022-04-20 ENCOUNTER — Encounter: Admit: 2022-04-20 | Payer: PRIVATE HEALTH INSURANCE | Primary: Internal Medicine

## 2022-04-20 DIAGNOSIS — I10 Essential (primary) hypertension: Secondary | ICD-10-CM

## 2022-04-20 DIAGNOSIS — I2510 Atherosclerotic heart disease of native coronary artery without angina pectoris: Secondary | ICD-10-CM

## 2022-04-20 DIAGNOSIS — E785 Hyperlipidemia, unspecified: Secondary | ICD-10-CM

## 2022-04-20 DIAGNOSIS — R339 Retention of urine, unspecified: Secondary | ICD-10-CM

## 2022-04-20 MED ORDER — TAMSULOSIN 0.4 MG CAPSULE
0.4 mg | ORAL_CAPSULE | Freq: Every day | ORAL | 1 refills | Status: AC
Start: 2022-04-20 — End: ?

## 2022-04-20 MED ORDER — POLYETHYLENE GLYCOL 3350 17 GRAM ORAL POWDER PACKET
17 gram | ORAL | Status: AC
Start: 2022-04-20 — End: ?

## 2022-04-20 MED ORDER — BISMUTH SUBSALICYLATE 262 MG CHEWABLE TABLET
262 mg | ORAL | Status: AC
Start: 2022-04-20 — End: ?

## 2022-04-20 NOTE — Progress Notes
Mark [name] is here today accompanied by his daughter [name] for a voiding trial. He is s/p emergency room visit on 04/07/22 due to urinary retention The existing foley bag presents with yellow colored urine.  With Rodney Booze, [place] as a chaperone present existing stat lock discontinued from the right leg, foley bag was disattached and patient was able to tolerate 200 mL of normal saline instilled into existing 16 fr catheter. Balloon deflated of 8cc and foley removed; straight tip intact. [name] was able to void 100 mL of yellow urine immediately after. PVR was 138cc.  Patient educated on signs and symptoms of infection and urinary retention to report. Pt has full understanding of instructions.  He is to return to the office approximately 2 pm in the afternoon to obtain a repeat PVR.[name] came back to the office at 2:15pm. Pt reports that he has only been able to attempt to go the restroom once and his stream was not well but he has no complaints of discomfort. He initially did not have the urge to void upon returning to the office but after a few minutes he had the urge to go, but states his stream was not that good.  PVR performed and resulted as 283cc.  Patient refuses to have the foley catheter re-inserted.  He says he believes he will improve on his own overnight at home.  Reviewed urinary retention instructions of reporting to the emergency room.  Patient also requesting to have a few tablets of Flomax ordered since he only has 1 tablet remaining and the prescription from the mail order pharmacy has not yet arrived.  Discussed with [name], PA.  She will send a script for 1 week of Flomax to the local pharmacy in the interim and also instructs for the patient to return for a PVR check this upcoming Friday.  Patient was notified and receives with understanding.[name] uses a cane and a wheelchair. Patient was identified as a fall risk. Universal Fall precautions protocol maintained during visit; ?	Room was free of clutter ?	Personal items were within reach ?	Verification that all needs were met prior to leaving the examination room. Additional Universal Fall Interventions included: Patient informed to remain in chair until clinician is present.Encouraged patient to use the assistive devices they use at home.Family members were encouraged to be with patient.When patient properly clothed and practical, examination room door was left open.Fall risk was identified in EMR with fall risk banner. Unit specific identifiers were applied -fall risk bracelet.

## 2022-04-22 ENCOUNTER — Ambulatory Visit: Admit: 2022-04-22 | Payer: PRIVATE HEALTH INSURANCE | Attending: Urology | Primary: Internal Medicine

## 2022-04-22 ENCOUNTER — Encounter: Admit: 2022-04-22 | Payer: PRIVATE HEALTH INSURANCE | Primary: Internal Medicine

## 2022-04-22 ENCOUNTER — Telehealth: Admit: 2022-04-22 | Payer: PRIVATE HEALTH INSURANCE | Attending: Urology | Primary: Internal Medicine

## 2022-04-22 DIAGNOSIS — I10 Essential (primary) hypertension: Secondary | ICD-10-CM

## 2022-04-22 DIAGNOSIS — E785 Hyperlipidemia, unspecified: Secondary | ICD-10-CM

## 2022-04-22 DIAGNOSIS — N399 Disorder of urinary system, unspecified: Secondary | ICD-10-CM

## 2022-04-22 DIAGNOSIS — I2510 Atherosclerotic heart disease of native coronary artery without angina pectoris: Secondary | ICD-10-CM

## 2022-04-22 NOTE — Progress Notes
Mark [name] is here today for a PVR. Patient is accompanied by his daughter. He uses a cane to ambulate. See previous phone call note for output today. PVR 263. Strong urine smell noted in office today. Mentioned the possibility of placing a foley catheter or CIC and patient was not interested. Consulted with Tawni Pummel, APRN who is not recommending foley catheter or CIC. He was instructed to continue to drink fluids, and continue to attempt to void. He has been using a urinal for measurement of output and documenting on his own. He was educated on symptoms of urinary retention, which he reports is well aware of. UC ordered. Instructed that culture results take about 2 days to come back. Strict ED precautions reviewed with patient and his daughter and they verbalize an understanding.Patient was identified as a fall risk. Universal Fall precautions protocol maintained during visit; 	Room was free of clutter 	Personal items were within reach 	Verification that all needs were met prior to leaving the examination room. Additional Universal Fall Interventions included: Patient informed to remain in chair until clinician is present.

## 2022-04-22 NOTE — Patient Instructions
Patient Education Urinary Retention About this topic Urinary retention is not being able to empty your bladder all the way. You have a problem in your urinary tract or in the nerves that control it. The urinary tract is made up of the kidneys, ureters, bladder, and urethra. The kidneys make urine and it drains into tubes called ureters. The ureters join to the bladder. The bladder squeezes out the urine and it leaves the body through the urethra.If this problem comes on all of a sudden, it is acute urinary retention or AUR. With AUR, you may not be able to pass any urine at all, even though your bladder is full. If the problem is chronic, or lasts a long time, you may not be able to empty your bladder all the way. This problem can cause damage to the kidneys or the bladder. What are the causes? Urinary retention may be caused NW:GNFAOZHY from infections or diabetesSwellingDrugsDropped bladder in females (cystocele)Bladder or kidney stonesProblems with nerves or spineBladder is too fullRecent surgeryProstate problems, narrow foreskin, or narrow urethral hole in menNarrow area or scar tissue in your urethraCongenital problems from birthWhat can make this more likely to happen? Urinary retention is more likely to happen in people with larger prostates. It also happens more often when you take some drugs or after surgery. If you have had urology surgery, you are also more likely to have urinary retention.What are the main signs? With AUR, you are not able to pass urine. Your bladder is swollen and may be sore. With chronic urinary retention, you QMV:HQIO trouble starting to pass urineNeed to go oftenFeel like you still have to go after you finish passing urineHave a weak streamOnly go a small amountHave belly pain and fullnessHave an infectionHow does the doctor diagnose this health problem? The doctor will ask you questions about your history and do an exam. The doctor may order tests like:Lab testsUrine testsUltrasoundUrodynamic tests to see how much urine you pass and how much urine is left in your bladderCystoscopy to look inside of your bladderHow does the doctor treat this health problem? Your care will be based on why you have urinary retention. The most important part of your care is to make sure you are able to empty your bladder all the way. You may need a catheter or tube passed into your bladder to help drain your urine. You may need this just one time or you may learn to place a catheter into your bladder a few times each day to drain your urine. . If this is not successful, you may need a tube placed into your bladder through your belly for a short time.Are there other health problems to treat? If your urinary retention is caused by a problem like a kidney stone or problem with your prostate, you may need more care.What drugs may be needed? Your doctor may order drugs NG:EXBMW the bladderTreat an infectionShrink the prostateHelp with painControl bladder spasmsWhere can I learn more? [employer] and [employer] DebtHeads.fr Last Reviewed Date 2021-09-20Consumer Information Use and Disclaimer This generalized information is a limited summary of diagnosis, treatment, and/or medication information. It is not meant to be comprehensive and should be used as a tool to help the user understand and/or assess potential diagnostic and treatment options. It does NOT include all information about conditions, treatments, medications, side effects, or risks that may apply to a specific patient. It is not intended to be medical advice or a substitute for the medical advice,  diagnosis, or treatment of a health care provider based on the health care provider's examination and assessment of a patient?s specific and unique circumstances. Patients must speak with a health care provider for complete information about their health, medical questions, and treatment options, including any risks or benefits regarding use of medications. This information does not endorse any treatments or medications as safe, effective, or approved for treating a specific patient. UpToDate, Inc. and its affiliates disclaim any warranty or liability relating to this information or the use thereof. The use of this information is governed by the Terms of Use, available at https://www.wolterskluwer.com/en/know/clinical-effectiveness-terms Copyright Copyright ? 2023 UpToDate, Inc. and its affiliates and/or licensors. All rights reserved.

## 2022-04-22 NOTE — Telephone Encounter
Spoke with [name] and patient. Patient reported to me his output since he left office yesterday. Totaling 2400 cc since then (about 24 hours). He has a urinal and he has been measuring and journaling. Patient is wearing a brief because of his increase in frequency. Patient had an appointment at 3 and this call was made at around 3:30. Reviewed with Tawni Pummel, APRN. Pt advised to still come in to office and complete PVR and send urine for a UC. Denies fever, chills, dysuria, odor. Patient and his daughter are agreeable to plan and on their way to [facility] office.

## 2022-04-22 NOTE — Telephone Encounter
[facility] MESSAGETime of call:   1:08 PMCaller:  SusanCaller's relationship to patient: patientReason for call: Patient's daughter [name] is calling in to speak with a nurse, she states patient is urinating a lot. She reports patient has urinated 1000 cc since last night till 6 am this morning. She stated patient stated he is not able to make it in to his appointment today since he can't stop urinating, it's so bad that patient had to put a diaper on. [name] is questioning if patient should still make it in to his appointment today at 3 pm for PVR. Please reach out to [name] to advise further. Provider: PA Wonda Cheng clinic visit date:  2/21/2024Best telephone number for callback:   [phone] time to return call (encourage patient to be available for callback):  anytimePermission to leave message: yesYahaira [facility]ist

## 2022-04-25 ENCOUNTER — Telehealth: Admit: 2022-04-25 | Payer: PRIVATE HEALTH INSURANCE | Attending: Urology | Primary: Internal Medicine

## 2022-04-25 LAB — URINE CULTURE: BKR URINE CULTURE, ROUTINE: >=100000 — AB

## 2022-04-25 MED ORDER — AMPICILLIN 500 MG CAPSULE
500 mg | ORAL_CAPSULE | Freq: Four times a day (QID) | ORAL | 1 refills | Status: AC
Start: 2022-04-25 — End: ?

## 2022-04-25 NOTE — Telephone Encounter
Called and spoke with patient's daughter [name] to relay message from PA: Urine C&S with Enterococcus.  Patient symptomatic.  CrCl 35.  Will treat with Ampicillin. RX to pharmacy.Daughter verbalized understanding, pharmacy confirmed. Daughter also mentioned that  [name] was concerned about retaining urine and that he was planning to call the office to speak with a nurse about his status but he is currently sleeping. Advised pt call back with concerns.

## 2022-04-25 NOTE — Telephone Encounter
Urine C&S with Enterococcus.  Patient symptomatic.  CrCl 35.  Will treat with Ampicillin. RX to pharmacy.

## 2022-04-25 NOTE — Telephone Encounter
The patients Daughter called back requesting to speak with a nurse . She would like to know the POC and when should he be seen in office. Please call to discuss. Thank you

## 2022-04-26 NOTE — Telephone Encounter
Spoke to pt's daughter Mark [name] as she wanted to know when pt should f/u, told her pt should be seen in 3-4 months with J.Pielech, pt wants to be seen in [place],all questions answered, routing to scheduling.

## 2022-04-27 ENCOUNTER — Telehealth: Admit: 2022-04-27 | Payer: PRIVATE HEALTH INSURANCE | Attending: Urology | Primary: Internal Medicine

## 2022-04-27 ENCOUNTER — Ambulatory Visit: Admit: 2022-04-27 | Payer: PRIVATE HEALTH INSURANCE | Primary: Internal Medicine

## 2022-04-27 ENCOUNTER — Encounter: Admit: 2022-04-27 | Payer: PRIVATE HEALTH INSURANCE | Primary: Internal Medicine

## 2022-04-27 DIAGNOSIS — I2510 Atherosclerotic heart disease of native coronary artery without angina pectoris: Secondary | ICD-10-CM

## 2022-04-27 DIAGNOSIS — I10 Essential (primary) hypertension: Secondary | ICD-10-CM

## 2022-04-27 DIAGNOSIS — E785 Hyperlipidemia, unspecified: Secondary | ICD-10-CM

## 2022-04-27 NOTE — Telephone Encounter
.  [facility] MESSAGETime of call:   9:06 AMCaller:   SusanCaller's relationship to patient:  Daughter  Calling from [employer], hospital, agency, etc.):   Reason for call:   Started last night with pain on his left side of his lower abdomen and today he has back pain. No sensation when he urinates or fever.  If not feeling well, what are symptoms:    If having symptoms, how long have the symptoms been present:     Does caller request to speak to someone urgently?     If yes, warm transferred to:  Provider:  Wonda Cheng clinic visit date:  02/23/24Best telephone number for callback:   [phone] time to return call (encourage patient to be available for callback):   anyPermission to leave message:  yes   Florentina Addison

## 2022-04-27 NOTE — Telephone Encounter
Contacted Mark [name] and spoke with visiting nurse and wife. Visiting nurse christian stated that patient is having lower abd pain and left flank pain, he states he is possibly retaining urine. No fever, nausea, chills at this time. Patient did say he was able to urinate this morning but unsure about the amount. NV made for [place] at 11am for PVR check/ possible foley placement. Wife in agreement, no further questions or concerns at this time.

## 2022-04-27 NOTE — Progress Notes
Mark [name] here today for slight abdominal discomfort per daughter. PVR  173ccc Pt states that his stream is much better since starting on flomax and antibiotic day#2. Pt will fu in 3 months and call us w/ any issues.

## 2022-04-28 DIAGNOSIS — R339 Retention of urine, unspecified: Secondary | ICD-10-CM

## 2022-06-24 ENCOUNTER — Ambulatory Visit: Admit: 2022-06-24 | Payer: PRIVATE HEALTH INSURANCE | Attending: Dermatology | Primary: Internal Medicine

## 2022-06-24 DIAGNOSIS — L570 Actinic keratosis: Secondary | ICD-10-CM

## 2022-06-24 DIAGNOSIS — L304 Erythema intertrigo: Secondary | ICD-10-CM

## 2022-06-24 DIAGNOSIS — L821 Other seborrheic keratosis: Secondary | ICD-10-CM

## 2022-06-24 MED ORDER — KETOCONAZOLE 2 % TOPICAL CREAM
2 % | Freq: Two times a day (BID) | TOPICAL | 3 refills | Status: AC
Start: 2022-06-24 — End: ?

## 2022-06-24 NOTE — Progress Notes
Chief Complaint: Mark [name]. [place]Subjective: Mark [name] is a 89 y.o. male who presents for total body skin examination.The patient has a new lesion of concern.LOC #1:Location: buttocks Duration: >2 month(s)Symptoms: painful bump on skinPrior treatment: desitin Past Medical History: Diagnosis Date  Chronic coronary artery disease   Hyperlipidemia   Hypertension  Current Outpatient Medications Medication Sig Dispense Refill  amLODIPine (NORVASC) 2.5 mg tablet Take 1 tablet (2.5 mg total) by mouth daily. 30 tablet 3  aspirin 81 mg EC delayed release tablet Take 1 tablet (81 mg total) by mouth daily.    atorvastatin (LIPITOR) 80 mg tablet Take 1 tablet (80 mg total) by mouth daily.    b complex vitamins capsule Take 1 capsule by mouth daily.    bismuth subsalicylate (PEPTO BISMOL) 262 mg chewable tablet Take 1 tablet (262 mg total) by mouth.    carvediloL (COREG) 12.5 mg Immediate Release tablet TAKE 1 TABLET BY MOUTH TWICE  DAILY WITH BREAKFAST AND DINNER 180 tablet 3  clopidogreL (PLAVIX) 75 mg tablet Take 1 tablet (75 mg total) by mouth daily.    gabapentin (NEURONTIN) 100 mg capsule Take 2 capsules (200 mg total) by mouth daily. hs    ipratropium (ATROVENT) 21 mcg (0.03 %) nasal spray USE 2 SPRAYS IN BOTH  NOSTRILS TWICE DAILY    isosorbide mononitrate (IMDUR) 30 mg 24 hr extended release tablet Take 1 tablet (30 mg total) by mouth daily.    ketoconazole (NIZORAL) 2 % cream Apply topically 2 (two) times daily. To affected area on buttock 30 g 2  losartan (COZAAR) 50 mg tablet Take 1 tablet (50 mg total) by mouth daily. 90 tablet 3  multivitamin tablet Take 1 tablet by mouth daily.    nitroGLYCERIN (NITROSTAT) 0.4 mg SL tablet Place 1 tablet (0.4 mg total) under the tongue every 5 (five) minutes as needed for chest pain. If no relief, call 911. May repeat every 5 minutes for a total of 3 tablets. 25 tablet 3  omeprazole (PRILOSEC) 20 mg capsule Take 1 capsule (20 mg total) by mouth daily.    polyethylene glycol (MIRALAX) 17 gram packet Take 1 packet (17 g total) by mouth.    tamsulosin (FLOMAX) 0.4 mg 24 hr capsule Take 1 capsule (0.4 mg total) by mouth nightly. 90 capsule 3  tamsulosin (FLOMAX) 0.4 mg 24 hr capsule Take 1 capsule (0.4 mg total) by mouth daily. 7 capsule 0 No current facility-administered medications for this visit. Allergies Allergen Reactions  Ace Inhibitors Cough ROS: The following were asked of the patient: No  Unanticipated change in weight	No  fevers/chillsNo  fatigueNo  new swollen lymph nodes No  recent illness/hospitatlizationNo  chest pain or shortness of breathNo  joint aches or painsThe patient's medications, allergies, problem list, and past medical, surgical and family histories were reviewed and updated as appropriate.Objective:Physical ExamGeneral - Well-nourished, well-developed, in no apparent distress, alert & orientedSuperior gluteal cleft with moist erythematous plaque and fissure Waxy stuck on papules on the backLeft cheek with a pink gritty papulePatient exam or treatment required medical chaperone.The sensitive parts of the examination were performed with chaperone present: Yes: Ismarie Pellot, LPNAssessment/Plan:1. Actinic Keratosis: left cheek- Treated with liquid nitrogen cryotherapy n=1- Discussed the risk of dyspigmentation, scar and blistering reaction. - Wound care instructions reviewed and handout provided2. Intertrigo - superior gluteal cleft- Start ketoconazole cream BID x 2-3 weeks- Can mix with desitin cream - Keep area dry3. Seborrheic keratoses on back- Discussed benign nature and provided reassurance- Discussed that patient should return  for new or concerning lesionsRTC: 2-3 months Preston Fleeting 06/24/2022 9:04 AM Scribed for Jeanelle Malling, MD by Preston Fleeting, medical scribe April 26, 2024The documentation recorded by the scribe accurately reflects the services I personally performed and the decisions made by me. I reviewed and confirmed all material entered and/or pre-charted by the scribe.

## 2022-06-24 NOTE — Patient Instructions
Mark [name]. Noel Gerold, MD, [facility] [address] [phone] cream prescribed. Apply twice daily for at least 2-3 weeks, can use as daily maintenance (non-steroid therapy). Liquid Nitrogen TreatmentApplication of liquid nitrogen results in freezing of the skin and can lead to swelling of skin locally (as in a bug bite reaction). This is followed by blister formation in some patients and then some crusting (in all patients). With the exception of the lower legs, crusts usually shed by day 10 following treatment. Crust formation can be minimized by liberal application of a bland ointment (e.g. Aquaphor) or petrolatum jelly (Vaseline). Treated areas can remain pink in color for several weeks. In addition, the skin may be permanently lighter in color at sites of treatment and sometimes the sites have a white color. No injection of local anesthesia is required.

## 2022-07-11 ENCOUNTER — Encounter: Admit: 2022-07-11 | Payer: PRIVATE HEALTH INSURANCE | Attending: Cardiovascular Disease | Primary: Internal Medicine

## 2022-07-11 ENCOUNTER — Ambulatory Visit: Admit: 2022-07-11 | Payer: PRIVATE HEALTH INSURANCE | Attending: Cardiovascular Disease | Primary: Internal Medicine

## 2022-07-11 DIAGNOSIS — I350 Nonrheumatic aortic (valve) stenosis: Secondary | ICD-10-CM

## 2022-07-11 DIAGNOSIS — I10 Essential (primary) hypertension: Secondary | ICD-10-CM

## 2022-07-11 DIAGNOSIS — E785 Hyperlipidemia, unspecified: Secondary | ICD-10-CM

## 2022-07-11 DIAGNOSIS — I2510 Atherosclerotic heart disease of native coronary artery without angina pectoris: Secondary | ICD-10-CM

## 2022-07-11 DIAGNOSIS — I252 Old myocardial infarction: Secondary | ICD-10-CM

## 2022-07-11 MED ORDER — LOSARTAN 25 MG TABLET
25 mg | Freq: Every day | ORAL | Status: AC
Start: 2022-07-11 — End: ?

## 2022-07-11 MED ORDER — ESCITALOPRAM 5 MG TABLET
5 mg | Freq: Every day | ORAL | Status: AC
Start: 2022-07-11 — End: ?

## 2022-07-11 NOTE — Patient Instructions
Stop amlodipineDrink more liquidsReturn for an echocardiogramReturn in 6 months

## 2022-07-11 NOTE — Progress Notes
[facility] and Vascular Center Cardiology Office VisitInterval History: This is a 89 y.o. male with a history of known coronary artery disease status post remote history of an IMI by patient's history, cardiac stenting, longstanding, well controlled hypertension, s/p left carotid endarterectomy, with a history of a known abdominal aortic aneurysm (4.5cm) who returns for follow up. He was last seen on 01/07/22. labs within normal ranges.He is accompanied by his daughter. Today, the patient reports feeling well. He ambulates with a rollator. He reports that his PCP Dr. Renaldo Reel reduced his losartan 2 weeks ago, as he was experiencing lower BP readings. He is taking a 25 mg dose. He reports experiencing urinary retention, and required catheterization. He is now wearing Depends as he is experiencing urinary incontinence. He reports his breathing has been stable, however reports that he continues to experiencing stable dyspnea on exertion that has not worsened over the last few months. He states that he is not drinking enough water throughout the day.  He denies experiencing any chest pain or palpitations. No lightheadedness, shortness of breath, orthopnea or PND. He denies experiencing any peripheral edema or claudication. No syncope, falls, or loss of consciousness. He is compliant with his medications. He has been monitoring his salt intake.  Social History: He lives with his daughter.  his wife passed away several months ago.His mother had CAD. He has 3 daughters. 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 He drinks 2 cups coffee daily, no alcohol. Review of Allergies/Medical History/Medications: Past Medical History: Diagnosis Date  Chronic coronary artery disease   Hyperlipidemia   Hypertension  Past Surgical History: Procedure Laterality Date  APPENDECTOMY    BLADDER SURGERY  03/01/2019  CHOLECYSTECTOMY    FRACTURE SURGERY    rifgt hand Outpatient Medications Marked as Taking for the 07/11/22 encounter (Office Visit) with [name]., MD Medication Sig Dispense Refill  aspirin 81 mg EC delayed release tablet Take 1 tablet (81 mg total) by mouth daily.    atorvastatin (LIPITOR) 80 mg tablet Take 1 tablet (80 mg total) by mouth daily.    b complex vitamins capsule Take 1 capsule by mouth daily.    bismuth subsalicylate (PEPTO BISMOL) 262 mg chewable tablet Take 1 tablet (262 mg total) by mouth.    carvediloL (COREG) 12.5 mg Immediate Release tablet TAKE 1 TABLET BY MOUTH TWICE  DAILY WITH BREAKFAST AND DINNER 180 tablet 3  clopidogreL (PLAVIX) 75 mg tablet Take 1 tablet (75 mg total) by mouth daily.    escitalopram oxalate (LEXAPRO) 5 mg tablet Take 0.5 tablets (2.5 mg total) by mouth daily.    gabapentin (NEURONTIN) 100 mg capsule Take 2 capsules (200 mg total) by mouth daily. hs    ipratropium (ATROVENT) 21 mcg (0.03 %) nasal spray USE 2 SPRAYS IN BOTH  NOSTRILS TWICE DAILY    isosorbide mononitrate (IMDUR) 30 mg 24 hr extended release tablet Take 1 tablet (30 mg total) by mouth daily.    ketoconazole (NIZORAL) 2 % cream Apply topically 2 (two) times daily. To affected area on buttock 30 g 2  losartan (COZAAR) 25 mg tablet Take 1 tablet (25 mg total) by mouth daily.    multivitamin tablet Take 1 tablet by mouth daily.    nitroGLYCERIN (NITROSTAT) 0.4 mg SL tablet Place 1 tablet (0.4 mg total) under the tongue every 5 (five) minutes as needed for chest pain. If no relief, call 911. May repeat every 5 minutes for a total of 3 tablets. 25  tablet 3  omeprazole (PRILOSEC) 20 mg capsule Take 1 capsule (20 mg total) by mouth daily.    polyethylene glycol (MIRALAX) 17 gram packet Take 1 packet (17 g total) by mouth.    tamsulosin (FLOMAX) 0.4 mg 24 hr capsule Take 1 capsule (0.4 mg total) by mouth nightly. 90 capsule 3  [DISCONTINUED] amLODIPine (NORVASC) 2.5 mg tablet Take 1 tablet (2.5 mg total) by mouth daily. 30 tablet 3  [DISCONTINUED] losartan (COZAAR) 50 mg tablet Take 1 tablet (50 mg total) by mouth daily. (Patient taking differently: Take 0.5 tablets (25 mg total) by mouth daily.) 90 tablet 3 Allergies Allergen Reactions  Ace Inhibitors Cough Review of Systems: Review of Systems Constitutional: Negative.  HENT: Negative.   Eyes: Negative.  Respiratory: Negative.   Cardiovascular: Negative.  Gastrointestinal: Negative.  Genitourinary: Negative.  Musculoskeletal: Negative.  Skin: Negative.  Neurological: Negative.  Endo/Heme/Allergies: Negative.  Psychiatric/Behavioral: Negative.   All other systems reviewed and are negative.Physical Exam: Vitals:  07/11/22 1140 BP: (!) 102/48 Site: Right Arm Position: Sitting Cuff Size: Large Pulse: 66 SpO2: 97% Weight: 97.5 kg Height: 5' 11 (1.803 m) PainSc:   0 - No pain BP (!) 102/48 (Site: r a, Position: Sitting, Cuff Size: Large)  - Pulse 66  - Ht 5' 11 (1.803 m)  - Wt 97.5 kg  - SpO2 97%  - BMI 29.99 kg/m?  Manual BP: 90/60Physical ExamConstitutional:     General: He is not in acute distress.   Appearance: He is well-developed. Neck:    Vascular: Normal carotid pulses. No carotid bruit, hepatojugular reflux or JVD. Cardiovascular:    Rate and Rhythm: Normal rate and regular rhythm.    Heart sounds: Murmur (soft of aortic stenosis, sinus murmur of AS) heard.    No friction rub.    Comments: Crisp S2Pulmonary:    Effort: Pulmonary effort is normal.    Breath sounds: Normal breath sounds. No wheezing or rales. Abdominal:    General: Bowel sounds are normal.    Palpations: Abdomen is soft.    Tenderness: There is no abdominal tenderness. There is no rebound. Musculoskeletal:       General: Normal range of motion.    Cervical back: Normal range of motion and neck supple.    Right lower leg: No edema.    Left lower leg: No edema. Skin:   General: Skin is warm and dry. Neurological:    Mental Status: He is alert and oriented to person, place, and time. Psychiatric:       Behavior: Behavior normal. Review of Labs/Diagnostics: ECG/Tele Events: Independently interpreted by me:  Normal sinus rhythm, left axis deviation, no acute ST or T-wave changesEcho Findings: Results for orders placed or performed during the hospital encounter of 01/01/21 Echo 2D Complete w Doppler and CFI if Ind Image Enhancement 3D and or bubbles Result Value Ref Range  Reported Biplane EF% 62 %  Narrative   * Normal left ventricular size and systolic function. Mild concentric left ventricular hypertrophy.  LVEF calculated by biplane Simpson's was 62%.  Abnormal tissue Doppler suggestive of abnormal diastolic function.* Normal right ventricular cavity size and systolic function.  Estimated right ventricular systolic pressure is 27 mmHg.* Atria are normal in size* Aortic valve is trileaflet.  Mild aortic valve calcification.  Aortic sclerosis without stenosis.  Peak aortic velocity is 1.6 m/s.  Mean gradient is 6 mmHg.  Dimensionless index is 0.70.  Trace aortic regurgitation.* Mitral valve appears calcified.  Mild anterior and moderate posterior mitral annular  calcification.  No mitral regurgitation.  No mitral stenosis.* IVC diameter < 2.1 cm that collapses > 50% with a sniff suggests normal RAP (0-5 mmHg, mean 3 mmHg).* No significant pericardial effusion* No prior study available for comparison Stress Test: No results found for this or any previous visit.Lab Review: Lab Results Component Value Date  CREATININE 1.76 (H) 04/07/2022  BUN 29 (H) 04/07/2022  NA 141 04/07/2022  K 4.3 04/07/2022  CL 108 (H) 04/07/2022  CO2 24 04/07/2022 Lab Results Component Value Date  CHOL 106 07/31/2019  LDL 51 07/31/2019  TRIG 67 07/31/2019  HDL 41 07/31/2019  Lab Results Component Value Date  HGBA1C 5.6 07/24/2019 Component    Latest Ref Rng 04/07/2022 WBC    4.0 - 11.0 x1000/?L 5.1  RBC    4.00 - 6.00 M/?L 5.00  Hemoglobin    13.2 - 17.1 g/dL 08.6 (L)  Hematocrit    38.50 - 50.00 % 37.70 (L)  MCV    80.0 - 100.0 fL 75.4 (L)  MCHC    31.0 - 36.0 g/dL 57.8  RDW-CV    46.9 - 15.0 % 15.8 (H)  Platelets    150 - 420 x1000/?L 181  MPV    8.0 - 12.0 fL 10.5  ANC (Abs Neutrophil Count)    2.00 - 7.60 x 1000/?L 3.20  Neutrophils    39.0 - 72.0 % 63.4  Lymphocytes    17.0 - 50.0 % 17.6  Absolute Lymphocyte Count    0.60 - 3.70 x 1000/?L 0.89  Monocytes    4.0 - 12.0 % 10.5  Monocytes (Absolute)    0.00 - 1.00 x 1000/?L 0.53  Eosinophils    0.0 - 5.0 % 7.5 (H)  Eosinophil Absolute Count    0.00 - 1.00 x 1000/?L 0.38  Basophils    0.0 - 1.4 % 0.8  Basophils Absolute    0.00 - 1.00 x 1000/?L 0.04  Immature Granulocytes    0.0 - 1.0 % 0.2  Immature Granulocytes (Abs)    0.00 - 0.30 x 1000/?L 0.01  nRBC    0.0 - 1.0 % 0.0  nRBC Absolute    0.00 - 1.00 x 1000/?L 0.00  MCH    27.0 - 33.0 pg 24.4 (L)   Legend:(L) Low(H) HighLIPID PANEL REFLEX TO DIRECT LDL (EXTERNAL LAB)Order: [id number] ComponentRef Range & Units 12/23/21 10:35 AM Cholesterol Total External<200 mg/dL 98 Cholesterol HDL External> OR = 40 mg/dL 35 Low  [id number]<440 mg/dL 73 LDL Cholesterolmg/dL (calc) 48  Cholesterol/HDL Ratio<5.0 (calc) 2.8 Non HDL Chol. (LDL+VLDL)<130 mg/dL (calc) 63 Recent Studies:  US Duplex Aorta IVC Iliac Bypass Graft Limited  01/25/22~ * 1. Infrarenal abdominal aortic aneurysm measuring 4.8 cm x 4.7 cm in maximum dimensions.~ 2. Mural thrombus present~ 3. No evidence of hemodynamically significant stenoses.~ 4. No significant change compared to prior study form 10/28/2019----------------------------------------------------------------XR Chest PA and Lateral 07/28/23INDICATION: chest painCOMPARISON: XR CHEST PA AND LATERAL 2019-09-29 FINDINGS:    No focal pulmonary opacity, pleural effusion, or pneumothorax.  Cardiomediastinal silhouette is within normal limits and similar to prior. No acute osseous injuries. Similar flowing bridging osteophyte across the anterior aspect of multiple adjacent thoracic vertebral bodies, consistent with diffuse idiopathic skeletal hyperostosis (DISH). IMPRESSION: No acute cardiothoracic abnormality. [facility] Notify System Classification: RoutineReport initiated by:  Alease Medina, MDReported and signed by: Virl Axe, MD [facility]----------------------------------------------------------------XR Chest Obliques  03/17/23CLINICAL HISTORY:Momodou STEPHANE NIEMANN is a 68 years old patient with a submitted history of shortness of breath.ADDITIONAL HISTORY:Shortness  of breathCOMPARISONS:March 30, 2019TECHNIQUE:  Multiple views of the chest performed.FINDINGS:  Normal cardiac size.No airspace consolidationVery faint reticulation at the lung basesNo significant pleural effusionsCalcified aortic plaque and coronary artery stentExam End: 05/14/21 12:21 PM  Specimen Collected: 05/14/21 12:32 PM Last Resulted: 05/14/21 12:33 PM Received From: [facility]  Result Received: 09/24/21  1:31 PM Encounter Diagnosis:    ICD-10-CM  1. Essential hypertension  I10 EKG (Clinic Performed)   Echo 2D Complete w Doppler and CFI if Ind Image Enhancement 3D and or bubbles  2. Hx of myocardial infarction  I25.2 Echo 2D Complete w Doppler and CFI if Ind Image Enhancement 3D and or bubbles  3. Aortic valve stenosis, etiology of cardiac valve disease unspecified  I35.0 Echo 2D Complete w Doppler and CFI if Ind Image Enhancement 3D and or bubbles  Impression and Plan: 89 y.o.  male with known coronary artery disease status post remote history of an IMI by patient's history, longstanding, well controlled hypertension, status post a left carotid endarterectomy, with a history of a known abdominal aortic aneurysm returns for follow-up. Over the past 6 months, the patient has been stable from a cardiovascular standpoint without chest pain, syncope or palpitations.  He does admit to progressive dyspnea on exertion.  He has not used nitroglycerin in quite some time.   today on exam, his blood pressure is too low, he is in sinus rhythm.  The rest his cardiovascular exam is  Notable for systolic ejection murmur along left sternal border consistent with aortic stenosiss.  There is no peripheral edema.  His EKG is unchanged.    For now, I have stopped the patient's amlodipine since his blood pressure is too low.  He is follow-up with his PCP next month and if his blood pressure remains below 100 systolic, I would suggest eliminating his Imdur.  The patient was encouraged to keep himself well hydrated and to use his walker more frequently.He will undergo repeat echocardiography to reassess the degree of aortic stenosis.  I will see the patient in 6 months for follow-upScribed for [name], MD by Gearldine Shown, medical scribe Jul 11, 2022  The documentation recorded by the scribe accurately reflects the services I personally performed and the decisions made by me. I reviewed and confirmed all material entered and/or pre-charted by the scribe. Signed: Charleston Ropes, MD.  For questions [phone]/13/20247:15 PM

## 2022-08-29 ENCOUNTER — Ambulatory Visit: Admit: 2022-08-29 | Payer: PRIVATE HEALTH INSURANCE | Attending: Urology | Primary: Internal Medicine

## 2022-09-09 ENCOUNTER — Encounter: Admit: 2022-09-09 | Payer: PRIVATE HEALTH INSURANCE | Attending: Internal Medicine | Primary: Internal Medicine

## 2022-09-09 ENCOUNTER — Ambulatory Visit: Admit: 2022-09-09 | Payer: PRIVATE HEALTH INSURANCE | Attending: Internal Medicine | Primary: Internal Medicine

## 2022-09-09 ENCOUNTER — Inpatient Hospital Stay: Admit: 2022-09-09 | Discharge: 2022-09-09 | Payer: PRIVATE HEALTH INSURANCE | Primary: Internal Medicine

## 2022-09-09 DIAGNOSIS — I129 Hypertensive chronic kidney disease with stage 1 through stage 4 chronic kidney disease, or unspecified chronic kidney disease: Secondary | ICD-10-CM

## 2022-09-09 DIAGNOSIS — N184 Chronic kidney disease, stage 4 (severe): Secondary | ICD-10-CM

## 2022-09-09 DIAGNOSIS — I25118 Atherosclerotic heart disease of native coronary artery with other forms of angina pectoris: Secondary | ICD-10-CM

## 2022-09-09 LAB — CBC WITH AUTO DIFFERENTIAL
BKR WAM ABSOLUTE IMMATURE GRANULOCYTES.: 0.01 x 1000/ÂµL (ref 0.00–0.30)
BKR WAM ABSOLUTE LYMPHOCYTE COUNT.: 1.34 x 1000/ÂµL (ref 0.60–3.70)
BKR WAM ABSOLUTE NRBC (2 DEC): 0 x 1000/ÂµL (ref 0.00–1.00)
BKR WAM ANC (ABSOLUTE NEUTROPHIL COUNT): 2.93 x 1000/ÂµL (ref 2.00–7.60)
BKR WAM BASOPHIL ABSOLUTE COUNT.: 0.03 x 1000/ÂµL (ref 0.00–1.00)
BKR WAM BASOPHILS: 0.6 % (ref 0.0–1.4)
BKR WAM EOSINOPHIL ABSOLUTE COUNT.: 0.36 x 1000/ÂµL (ref 0.00–1.00)
BKR WAM EOSINOPHILS: 6.8 % — ABNORMAL HIGH (ref 0.0–5.0)
BKR WAM HEMATOCRIT (2 DEC): 32.3 % — ABNORMAL LOW (ref 38.50–50.00)
BKR WAM HEMOGLOBIN: 10.3 g/dL — ABNORMAL LOW (ref 13.2–17.1)
BKR WAM IMMATURE GRANULOCYTES: 0.2 % (ref 0.0–1.0)
BKR WAM LYMPHOCYTES: 25.4 % — ABNORMAL LOW (ref 17.0–50.0)
BKR WAM MCH (PG): 24.2 pg — ABNORMAL LOW (ref 27.0–33.0)
BKR WAM MCHC: 31.9 g/dL — ABNORMAL HIGH (ref 31.0–36.0)
BKR WAM MCV: 75.8 fL — ABNORMAL LOW (ref 80.0–100.0)
BKR WAM MONOCYTE ABSOLUTE COUNT.: 0.6 x 1000/ÂµL (ref 0.00–1.00)
BKR WAM MONOCYTES: 11.4 % (ref 4.0–12.0)
BKR WAM MPV: 10.9 fL (ref 8.0–12.0)
BKR WAM NEUTROPHILS: 55.6 % (ref 39.0–72.0)
BKR WAM NUCLEATED RED BLOOD CELLS: 0 % (ref 0.0–1.0)
BKR WAM PLATELETS: 147 x1000/ÂµL — ABNORMAL LOW (ref 150–420)
BKR WAM RDW-CV: 15.8 % — ABNORMAL HIGH (ref 11.0–15.0)
BKR WAM RED BLOOD CELL COUNT.: 4.26 M/ÂµL (ref 4.00–6.00)
BKR WAM WHITE BLOOD CELL COUNT: 5.3 x1000/ÂµL (ref 4.0–11.0)

## 2022-09-09 LAB — BASIC METABOLIC PANEL
BKR ANION GAP: 9 (ref 7–17)
BKR BLOOD UREA NITROGEN: 33 mg/dL — ABNORMAL HIGH (ref 8–23)
BKR BUN / CREAT RATIO: 17.8 (ref 8.0–23.0)
BKR CALCIUM: 9 mg/dL (ref 8.8–10.2)
BKR CHLORIDE: 109 mmol/L — ABNORMAL HIGH (ref 98–107)
BKR CO2: 25 mmol/L (ref 20–30)
BKR CREATININE: 1.85 mg/dL — ABNORMAL HIGH (ref 0.40–1.30)
BKR EGFR, CREATININE (CKD-EPI 2021): 33 mL/min/{1.73_m2} — ABNORMAL LOW (ref >=60)
BKR GLUCOSE: 108 mg/dL — ABNORMAL HIGH (ref 70–100)
BKR POTASSIUM: 4.3 mmol/L (ref 3.3–5.3)
BKR SODIUM: 143 mmol/L (ref 136–144)

## 2022-09-09 LAB — NT-PROBNPE: BKR B-TYPE NATRIURETIC PEPTIDE, PRO (PROBNP): 3173 pg/mL — ABNORMAL HIGH (ref <450.0)

## 2022-09-10 LAB — VITAMIN D, 25-HYDROXY: BKR VITAMIN D 25-HYDROXY TOTAL: 30 ng/mL

## 2022-09-12 ENCOUNTER — Ambulatory Visit: Admit: 2022-09-12 | Payer: PRIVATE HEALTH INSURANCE | Primary: Internal Medicine

## 2022-09-14 ENCOUNTER — Inpatient Hospital Stay
Admit: 2022-09-14 | Discharge: 2022-09-15 | Payer: PRIVATE HEALTH INSURANCE | Attending: Vascular Surgery | Admitting: Vascular Surgery | Primary: Internal Medicine

## 2022-09-14 ENCOUNTER — Emergency Department: Admit: 2022-09-14 | Payer: PRIVATE HEALTH INSURANCE | Primary: Internal Medicine

## 2022-09-14 ENCOUNTER — Encounter: Admit: 2022-09-14 | Payer: PRIVATE HEALTH INSURANCE | Primary: Internal Medicine

## 2022-09-14 ENCOUNTER — Ambulatory Visit: Admit: 2022-09-14 | Payer: PRIVATE HEALTH INSURANCE | Primary: Internal Medicine

## 2022-09-14 DIAGNOSIS — E785 Hyperlipidemia, unspecified: Secondary | ICD-10-CM

## 2022-09-14 DIAGNOSIS — I10 Essential (primary) hypertension: Secondary | ICD-10-CM

## 2022-09-14 DIAGNOSIS — I2510 Atherosclerotic heart disease of native coronary artery without angina pectoris: Secondary | ICD-10-CM

## 2022-09-14 DIAGNOSIS — R339 Retention of urine, unspecified: Secondary | ICD-10-CM

## 2022-09-14 LAB — BASIC METABOLIC PANEL
BKR ANION GAP: 13 (ref 7–17)
BKR BLOOD UREA NITROGEN: 29 mg/dL — ABNORMAL HIGH (ref 8–23)
BKR BUN / CREAT RATIO: 13.8 (ref 8.0–23.0)
BKR CALCIUM: 9.4 mg/dL (ref 8.8–10.2)
BKR CHLORIDE: 108 mmol/L — ABNORMAL HIGH (ref 98–107)
BKR CO2: 24 mmol/L (ref 20–30)
BKR CREATININE: 2.1 mg/dL — ABNORMAL HIGH (ref 0.40–1.30)
BKR EGFR, CREATININE (CKD-EPI 2021): 28 mL/min/{1.73_m2} — ABNORMAL LOW (ref >=60)
BKR GLUCOSE: 122 mg/dL — ABNORMAL HIGH (ref 70–100)
BKR POTASSIUM: 3.9 mmol/L (ref 3.3–5.3)
BKR SODIUM: 145 mmol/L — ABNORMAL HIGH (ref 136–144)

## 2022-09-14 LAB — CBC WITH AUTO DIFFERENTIAL
BKR WAM ABSOLUTE IMMATURE GRANULOCYTES.: 0.01 x 1000/ÂµL (ref 0.00–0.30)
BKR WAM ABSOLUTE LYMPHOCYTE COUNT.: 1.42 x 1000/ÂµL (ref 0.60–3.70)
BKR WAM ABSOLUTE NRBC (2 DEC): 0 x 1000/ÂµL (ref 0.00–1.00)
BKR WAM ANC (ABSOLUTE NEUTROPHIL COUNT): 3.63 x 1000/ÂµL (ref 2.00–7.60)
BKR WAM BASOPHIL ABSOLUTE COUNT.: 0.03 x 1000/ÂµL (ref 0.00–1.00)
BKR WAM BASOPHILS: 0.5 % (ref 0.0–1.4)
BKR WAM EOSINOPHIL ABSOLUTE COUNT.: 0.25 x 1000/ÂµL (ref 0.00–1.00)
BKR WAM EOSINOPHILS: 4.2 % (ref 0.0–5.0)
BKR WAM HEMATOCRIT (2 DEC): 32.5 % — ABNORMAL LOW (ref 38.50–50.00)
BKR WAM HEMOGLOBIN: 10.6 g/dL — ABNORMAL LOW (ref 13.2–17.1)
BKR WAM IMMATURE GRANULOCYTES: 0.2 % (ref 0.0–1.0)
BKR WAM LYMPHOCYTES: 23.9 % (ref 17.0–50.0)
BKR WAM MCH (PG): 24 pg — ABNORMAL LOW (ref 27.0–33.0)
BKR WAM MCHC: 32.6 g/dL (ref 31.0–36.0)
BKR WAM MCV: 73.7 fL — ABNORMAL LOW (ref 80.0–100.0)
BKR WAM MONOCYTE ABSOLUTE COUNT.: 0.6 x 1000/ÂµL (ref 0.00–1.00)
BKR WAM MONOCYTES: 10.1 % (ref 4.0–12.0)
BKR WAM MPV: 10.6 fL (ref 8.0–12.0)
BKR WAM NEUTROPHILS: 61.1 % (ref 39.0–72.0)
BKR WAM NUCLEATED RED BLOOD CELLS: 0 % (ref 0.0–1.0)
BKR WAM PLATELETS: 187 x1000/ÂµL (ref 150–420)
BKR WAM RDW-CV: 15.7 % — ABNORMAL HIGH (ref 11.0–15.0)
BKR WAM RED BLOOD CELL COUNT.: 4.41 M/ÂµL (ref 4.00–6.00)
BKR WAM WHITE BLOOD CELL COUNT: 5.9 x1000/ÂµL (ref 4.0–11.0)

## 2022-09-14 LAB — URINALYSIS WITH CULTURE REFLEX      (BH LMW YH)
BKR BILIRUBIN, UA: NEGATIVE
BKR GLUCOSE, UA: NEGATIVE
BKR KETONES, UA: NEGATIVE
BKR NITRITE, UA: NEGATIVE
BKR PH, UA: 6 (ref 5.5–7.5)
BKR PROTEIN, UA: NEGATIVE
BKR SPECIFIC GRAVITY, UA: 1.011 (ref 1.005–1.030)
BKR UROBILINOGEN, UA (MG/DL): <2 mg/dL (ref <=2.0)

## 2022-09-14 LAB — UA REFLEX CULTURE

## 2022-09-14 LAB — URINE MICROSCOPIC     (BH GH LMW YH)
BKR RBC/HPF INSTRUMENT: 19 /HPF — ABNORMAL HIGH (ref 0–2)
BKR WBC/HPF INSTRUMENT: 66 /HPF — ABNORMAL HIGH (ref 0–5)

## 2022-09-14 MED ORDER — CEFTRIAXONE IV PUSH 1000 MG VIAL & NS (ADULTS)
INTRAVENOUS | Status: DC
Start: 2022-09-14 — End: 2022-09-15
  Administered 2022-09-14: 22:00:00 10.000 mL via INTRAVENOUS

## 2022-09-14 MED ORDER — FUROSEMIDE 20 MG TABLET
20 | Freq: Every day | ORAL | Status: AC
Start: 2022-09-14 — End: ?

## 2022-09-14 MED ORDER — LIDOCAINE 2 % MUCOSAL JELLY IN APPLICATOR
2 | Freq: Once | URETHRAL | Status: CP
Start: 2022-09-14 — End: ?
  Administered 2022-09-14: 15:00:00 2 mL via URETHRAL

## 2022-09-14 MED ORDER — CLOPIDOGREL 75 MG TABLET
75 mg | Freq: Every day | ORAL | Status: DC
Start: 2022-09-14 — End: 2022-09-15
  Administered 2022-09-15: 13:00:00 75 mg via ORAL

## 2022-09-14 MED ORDER — ESCITALOPRAM (LEXAPRO) 2.5 MG HALFTAB
2.5 mg | Freq: Every day | ORAL | Status: DC
Start: 2022-09-14 — End: 2022-09-15
  Administered 2022-09-15: 13:00:00 2.5 mg via ORAL

## 2022-09-14 MED ORDER — LOSARTAN 25 MG TABLET
25 mg | Freq: Every day | ORAL | Status: DC
Start: 2022-09-14 — End: 2022-09-15
  Administered 2022-09-14 – 2022-09-15 (×2): 25 mg via ORAL

## 2022-09-14 MED ORDER — TAMSULOSIN 0.4 MG CAPSULE
0.4 | Freq: Two times a day (BID) | ORAL | Status: AC
Start: 2022-09-14 — End: ?

## 2022-09-14 MED ORDER — PREDNISONE 10 MG TABLET
10 | Freq: Every day | ORAL | Status: AC
Start: 2022-09-14 — End: ?

## 2022-09-14 MED ORDER — AMOXICILLIN 875 MG-POTASSIUM CLAVULANATE 125 MG TABLET
875-125 | Freq: Once | ORAL | Status: CP
Start: 2022-09-14 — End: ?
  Administered 2022-09-14: 19:00:00 875-125 mg via ORAL

## 2022-09-14 MED ORDER — TAMSULOSIN 0.4 MG CAPSULE
0.4 mg | Freq: Every evening | ORAL | Status: DC
Start: 2022-09-14 — End: 2022-09-15
  Administered 2022-09-15: 02:00:00 0.4 mg via ORAL

## 2022-09-14 MED ORDER — POLYETHYLENE GLYCOL 3350 17 GRAM ORAL POWDER PACKET
17 gram | Freq: Every day | ORAL | Status: DC
Start: 2022-09-14 — End: 2022-09-15
  Administered 2022-09-15: 12:00:00 17 gram via ORAL

## 2022-09-14 MED ORDER — GABAPENTIN 100 MG CAPSULE
100 mg | Freq: Every evening | ORAL | Status: DC
Start: 2022-09-14 — End: 2022-09-15
  Administered 2022-09-15: 01:00:00 100 mg via ORAL

## 2022-09-14 MED ORDER — LABETALOL 5 MG/ML INTRAVENOUS SOLUTION
5 mg/mL | Freq: Four times a day (QID) | INTRAVENOUS | Status: DC | PRN
Start: 2022-09-14 — End: 2022-09-15

## 2022-09-14 MED ORDER — ASPIRIN 81 MG CHEWABLE TABLET
81 mg | Freq: Every day | ORAL | Status: DC
Start: 2022-09-14 — End: 2022-09-15
  Administered 2022-09-15: 13:00:00 81 mg via ORAL

## 2022-09-14 MED ORDER — CARVEDILOL IMMEDIATE RELEASE 12.5 MG TABLET
12.5 mg | Freq: Two times a day (BID) | ORAL | Status: DC
Start: 2022-09-14 — End: 2022-09-15
  Administered 2022-09-14 – 2022-09-15 (×2): 12.5 mg via ORAL

## 2022-09-14 MED ORDER — ROSUVASTATIN 40 MG TABLET
40 mg | Freq: Every day | ORAL | Status: DC
Start: 2022-09-14 — End: 2022-09-15
  Administered 2022-09-15: 13:00:00 40 mg via ORAL

## 2022-09-14 MED ORDER — AMOXICILLIN 875 MG-POTASSIUM CLAVULANATE 125 MG TABLET
875-125 | ORAL_TABLET | Freq: Two times a day (BID) | ORAL | 1 refills | Status: AC
Start: 2022-09-14 — End: 2022-09-15

## 2022-09-14 MED ORDER — ISOSORBIDE MONONITRATE ER 30 MG TABLET,EXTENDED RELEASE 24 HR
30 mg | Freq: Every day | ORAL | Status: DC
Start: 2022-09-14 — End: 2022-09-15
  Administered 2022-09-14 – 2022-09-15 (×2): 30 mg via ORAL

## 2022-09-14 MED ORDER — ACETAMINOPHEN 325 MG TABLET
325 mg | Freq: Four times a day (QID) | ORAL | Status: DC | PRN
Start: 2022-09-14 — End: 2022-09-15

## 2022-09-14 MED ORDER — SENNOSIDES 8.6 MG TABLET
8.6 mg | Freq: Every evening | ORAL | Status: DC
Start: 2022-09-14 — End: 2022-09-15
  Administered 2022-09-15: 01:00:00 8.6 mg via ORAL

## 2022-09-14 NOTE — Plan of Care
Plan of Care Overview/ Patient Status    Patient a/ox4, Hypertensive, RA, sba ambulation w/ cane, BILat Hearing Aids in place.  SR on monitor. Foley catheter in place for acute retention, draining yellow urine. Patient states that he went to ED because of abdominal pain, found relief with placement of foley catheter but is staying because of the aortic aneurysm enlarging. Patient is cooperative with care, med compliant.

## 2022-09-14 NOTE — ED Notes
10:31 AM Pt walked in to the ED for eval of RLQ pain radiating to the R flank area that woke pt up this morning. Pt describes pain at 9/10, describes the pain as a persistence ache. Pt states that he has been taking Flomax daily, endorses urgency, states that when he urinates, he only voids a scant amt before needing to void again. Pt denies N/V/D, denies fever/chills.  Provider at bedside. Provider at bedside. Bladder scan showed > 500 mL of urine retained, plan for Coude catheter. Pt attempted to void, PVR 489 mL. Chief Complaint Patient presents with  Urinary Retention   arrives with urinary retention and lower abdomenal pain radiating to RLQ to right flank, patient is able to urinate however only able to urinate small amounts at a time, +bladder fullness. took flomax yesterday. Patient denies dysuria/hematura 11:44 AM 16 Fr Coude Foley catheter placed, draining malodorous straw urine to gravity. Pt verbalized symptom relief to lower abd after placement of catheter. Pending [place] scan. 12:35 PM Pt returned from [place], denies abd pain, states that abd pain is gone after placement of catheter. 1:18 PM Provider at bedside to update on DI results, pending vascular consult, pending discharge plan.

## 2022-09-14 NOTE — Utilization Review (ED)
UM Status: Managed Medicare IPAbdominal pain/ increase in size of abdominal aortic aneurism.

## 2022-09-14 NOTE — ED Notes
 Floor Handoff Telemetry: 	[x]  Yes		[]  NoCode Status:   [x]  Full		[]  DNR		[]  DNI		Other (specify):Safety Precautions: [x]  Fall Risk  []  Sitter   []  Restraints	[]  Suicidal	[]  None	Other (specify):Mentation/Orientation:	 A&O (Self, person, place, time) x    4      	 Disoriented to:                    	 Special Accommodations: []  Hearing impaired   []  Blind  []  Nonverbal  []  Cognitive impairmentOxygenation Upon Admission: [x]  RA	[]  NC	[]  Venti  []  Simple Mask []  Other	Baseline O2 Status? []  Yes	[]  NoAmbulation: []  Independent	[x]  Cane   []  Walker	[]  Wheelchair	[]  Bedbound		[]  Hemiplegic	[]  Paraplegic	[]  QuadraplegicEliminiation: []  Independent	[]  Commode	[]  Bedpan/Urinal  []  Straight Cath [x]  Foley cath			[]  Urostomy	[]  Colostomy	Other (specify):Diarrhea/Loose stool : []  1x within 24h  []  2x within 24h  []  3x within 24h  [x]  None 	C.Diff Order: 	[]  Ordered- needs to be collected             []  Collected-sent to lab             []  Resulted - Negative C.Diff             []  Resulted - Positive C.Diff[x]  Not Ordered   []  N/ASkin Alteration: []  Pressure Injury []  Wound []  None [x]  Skin not assessedDiet: [x]  Regular/No order placed	[]  NPO		Other (specify):IV Access: [x]  PIV   []  PICC    []  Port    [] Central line    []  A-line    Other (specify)IVF/GTT Running Upon ED Departure? [x]  No	    []  Yes (specify): , RN

## 2022-09-14 NOTE — Other
[facility]	Vascular Consult Note Patient Data:  Patient Name: Mark [name] Age: 89 y.o. DOB: 1926/01/05	 MRN: [id number]	 Consult Information: Consultation requested by: [name], MDReason for consultation: AAASource of Information: Patient and EMR/Previous RecordPresentation History: HPI This is a 89 yo male with a PMH of BPH, HTN, HLD, CKD, and CAD presenting with abdominal pain/ urinary retention. The patient had a foley catheter placed and had some relief of his abdominal pain in the ED. A U/A was performed in the ED and appears to be + for UTI. A [place] scan was performed that shows an increase in size of his known AAA to 5.2 cm x 4.5 cm. Per pt his cardiologist has been monitoring the size of his aneurysm. The patient was seen urgently in 2021 for his AAA. At the time the plan was for the pt to follow up in the vascular clinic to establish care. Pt never followed up. Vascular surgery is consulted for evaluation of his enlarging AAA. Review of Allergies/Medical History/Medications: I have reviewed the patient's allergies, prior to admission meds, past medical/surgical, family and social hx. Inpatient Medications:I have reviewed the patient's current medication orders.Review of Systems: Review of SystemsPhysical Exam: Vitals:I have reviewed the patient's current vital signs as documented in the patient's EMR.  Intake/Output:I have reviewed the patient's current I&O's as documented in the EMR.Physical ExamReview of Labs/Diagnostics: Lab Review:Last CMP: Clarity, UA Date/Time Value Ref Range Status 09/14/2022 1048 Clear Clear Final Ketones, UA Date/Time Value Ref Range Status 09/14/2022 1048 Negative Negative Final CO2 Date/Time Value Ref Range Status 09/14/2022 1118 24 20 - 30 mmol/L Final CO2 Calculated, Venous (POC) Date/Time Value Ref Range Status 03/09/2019 1751 26.0 22.0 - 30.0 % Final Glucose Date/Time Value Ref Range Status 09/14/2022 1118 122 (H) 70 - 100 mg/dL Final Glucose, UA Date/Time Value Ref Range Status 09/14/2022 1048 Negative Negative Final Glucose, Meter Date/Time Value Ref Range Status 09/29/2019 1444 116 (H) 70 - 100 mg/dL Final eGFR (NON African-American) Date/Time Value Ref Range Status 07/16/2020 1351 31 >60 mL/min/1.48m2 Final   Comment:   Values under 20mL/min/1.73m2 may indicate CKD if noted for  more than 3 months. eGFR is only valid if creatinine is at steady state. eGFR (Afr Amer) Date/Time Value Ref Range Status 07/16/2020 1351 38 >60 mL/min/1.28m2 Final   Comment:   Values under 40mL/min/1.73m2 may indicate CKD if noted for  more than 3 months. eGFR is only valid if creatinine is at steady state. eGFR (Creatinine) Date/Time Value Ref Range Status 09/14/2022 1118 28 (L) >=60 mL/min/1.20m2 Final   Comment:   Values < 60 mL/min/1.73 m2 may indicate CKD if present for more than three months AND creatinine is at steady state. The eGFR provides a rough estimate of kidney function.On 10/13/20 all [facility] Clinical Labs and Epic began using a non-race-based formula for estimating GFR called CKD-EPI Creatinine 2021. This equation reports eGFR based on creatinine, patient age, clinical sex, and is standardized to a body surface area of 1.73 m2. For the same creatinine, this new race-free eGFR will be lower than prior reported Black eGFR results and higher than prior Non-Black eGFR results.For further guidance, please refer to the CKD: Adult Ambulatory Care Signature pathway. Calcium Date/Time Value Ref Range Status 09/14/2022 1118 9.4 8.8 - 10.2 mg/dL Final ALT Date/Time Value Ref Range Status 07/31/2019 1015 15 9 - 46 U/L Final AST Date/Time Value Ref Range Status 07/31/2019 1015 16 10 - 35 U/L Final AST/ALT Ratio Date/Time Value Ref Range Status 04/07/2022 0855 1.4 Reference  Range Not Established Final Albumin Date/Time Value Ref Range Status 04/07/2022 0855 4.0 3.6 - 4.9 g/dL Final Albumin/Globulin Ratio Date/Time Value Ref Range Status 07/31/2019 1015 1.7 1.0 - 2.5 (calc) Final BUN Date/Time Value Ref Range Status 09/14/2022 1118 29 (H) 8 - 23 mg/dL Final BUN/Creatinine Ratio Date/Time Value Ref Range Status 09/14/2022 1118 13.8 8.0 - 23.0 Final Creatinine Date/Time Value Ref Range Status 09/14/2022 1118 2.10 (H) 0.40 - 1.30 mg/dL Final Total Protein Date/Time Value Ref Range Status 04/07/2022 0855 6.6 6.6 - 8.7 g/dL Final Protein, UA Date/Time Value Ref Range Status 09/14/2022 1048 Negative Negative, Trace Final Protein, Total Date/Time Value Ref Range Status 07/31/2019 1015 6.7 6.1 - 8.1 g/dL Final Prothrombin Time Date/Time Value Ref Range Status 07/16/2020 1353 11.8 9.6 - 12.3 seconds Final Last CBC, Platelets: No results found for: CBCLast urinalysis: No results found for: URINALYSISDiagnostic Review:I have reviewed the patient's Radiology report(s) within the last 48 hrs. Significant findings are enlarging AAA.  ECG/Tele Events: No ECG ordered todayImpression: 89 yo male presenting to ED with abdominal pain that was relieved with catheter insertion and also found to have an  increase in size of his abdominal aortic aneurysmI have reviewed the patient's problem list and updated it as needed.Recommendations: - Admit to observation to ensure no further episodes of abdominal pain-Will D/W Dr Etheleen Mayhew was evaluated: In personElectronically Signed: Arnetha Massy, APRN For questions please page: [phone]/17/2024 1:36 PM

## 2022-09-15 ENCOUNTER — Encounter: Admit: 2022-09-15 | Payer: PRIVATE HEALTH INSURANCE | Attending: Vascular Surgery | Primary: Internal Medicine

## 2022-09-15 ENCOUNTER — Telehealth: Admit: 2022-09-15 | Payer: PRIVATE HEALTH INSURANCE | Primary: Internal Medicine

## 2022-09-15 ENCOUNTER — Ambulatory Visit: Admit: 2022-09-15 | Payer: PRIVATE HEALTH INSURANCE | Primary: Internal Medicine

## 2022-09-15 ENCOUNTER — Inpatient Hospital Stay: Admit: 2022-09-15 | Discharge: 2022-09-15 | Payer: PRIVATE HEALTH INSURANCE | Attending: Emergency Medicine

## 2022-09-15 ENCOUNTER — Encounter: Admit: 2022-09-15 | Payer: PRIVATE HEALTH INSURANCE | Primary: Internal Medicine

## 2022-09-15 DIAGNOSIS — N136 Pyonephrosis: Secondary | ICD-10-CM

## 2022-09-15 DIAGNOSIS — I7143 Infrarenal abdominal aortic aneurysm, without rupture: Secondary | ICD-10-CM

## 2022-09-15 DIAGNOSIS — E785 Hyperlipidemia, unspecified: Secondary | ICD-10-CM

## 2022-09-15 DIAGNOSIS — N401 Enlarged prostate with lower urinary tract symptoms: Secondary | ICD-10-CM

## 2022-09-15 DIAGNOSIS — I129 Hypertensive chronic kidney disease with stage 1 through stage 4 chronic kidney disease, or unspecified chronic kidney disease: Secondary | ICD-10-CM

## 2022-09-15 DIAGNOSIS — Z888 Allergy status to other drugs, medicaments and biological substances status: Secondary | ICD-10-CM

## 2022-09-15 DIAGNOSIS — I2510 Atherosclerotic heart disease of native coronary artery without angina pectoris: Secondary | ICD-10-CM

## 2022-09-15 DIAGNOSIS — I714 Abdominal aortic aneurysm (AAA) without rupture, unspecified part (HC Code): Secondary | ICD-10-CM

## 2022-09-15 DIAGNOSIS — R338 Other retention of urine: Secondary | ICD-10-CM

## 2022-09-15 DIAGNOSIS — N183 Chronic kidney disease, stage 3 unspecified: Secondary | ICD-10-CM

## 2022-09-15 DIAGNOSIS — N189 Chronic kidney disease, unspecified: Secondary | ICD-10-CM

## 2022-09-15 LAB — BASIC METABOLIC PANEL
BKR ANION GAP: 12 g/dL — ABNORMAL LOW (ref 7–17)
BKR BLOOD UREA NITROGEN: 28 mg/dL — ABNORMAL HIGH (ref 8–23)
BKR BUN / CREAT RATIO: 15.5 (ref 8.0–23.0)
BKR CALCIUM: 8.7 mg/dL — ABNORMAL LOW (ref 8.8–10.2)
BKR CHLORIDE: 109 mmol/L — ABNORMAL HIGH (ref 98–107)
BKR CO2: 23 mmol/L (ref 20–30)
BKR CREATININE: 1.81 mg/dL — ABNORMAL HIGH (ref 0.40–1.30)
BKR EGFR, CREATININE (CKD-EPI 2021): 34 mL/min/{1.73_m2} — ABNORMAL LOW (ref >=60–420)
BKR GLUCOSE: 99 mg/dL — ABNORMAL LOW (ref 70–100)
BKR POTASSIUM: 3.9 mmol/L (ref 3.3–5.3)
BKR SODIUM: 144 mmol/L (ref 136–144)

## 2022-09-15 LAB — CBC WITH AUTO DIFFERENTIAL
BKR WAM ABSOLUTE IMMATURE GRANULOCYTES.: 0.01 x 1000/ÂµL (ref 0.00–0.30)
BKR WAM ABSOLUTE LYMPHOCYTE COUNT.: 1.25 x 1000/ÂµL (ref 0.60–3.70)
BKR WAM ABSOLUTE NRBC (2 DEC): 0 x 1000/ÂµL (ref 0.00–1.00)
BKR WAM ANC (ABSOLUTE NEUTROPHIL COUNT): 4.05 x 1000/ÂµL (ref 2.00–7.60)
BKR WAM BASOPHIL ABSOLUTE COUNT.: 0.03 x 1000/ÂµL (ref 0.00–1.00)
BKR WAM BASOPHILS: 0.5 % (ref 0.0–1.4)
BKR WAM EOSINOPHIL ABSOLUTE COUNT.: 0.33 x 1000/ÂµL (ref 0.00–1.00)
BKR WAM EOSINOPHILS: 5.2 % — ABNORMAL HIGH (ref 0.0–5.0)
BKR WAM HEMATOCRIT (2 DEC): 31.1 % — ABNORMAL LOW (ref 38.50–50.00)
BKR WAM HEMOGLOBIN: 9.8 g/dL — ABNORMAL LOW (ref 13.2–17.1)
BKR WAM IMMATURE GRANULOCYTES: 0.2 % (ref 0.0–1.0)
BKR WAM LYMPHOCYTES: 19.7 % (ref 17.0–50.0)
BKR WAM MCH (PG): 23.5 pg — ABNORMAL LOW (ref 27.0–33.0)
BKR WAM MCHC: 31.5 g/dL (ref 31.0–36.0)
BKR WAM MCV: 74.6 fL — ABNORMAL LOW (ref 80.0–100.0)
BKR WAM MONOCYTE ABSOLUTE COUNT.: 0.67 x 1000/ÂµL (ref 0.00–1.00)
BKR WAM MONOCYTES: 10.6 % (ref 4.0–12.0)
BKR WAM MPV: 11 fL (ref 8.0–12.0)
BKR WAM NEUTROPHILS: 63.8 % (ref 39.0–72.0)
BKR WAM NUCLEATED RED BLOOD CELLS: 0 % (ref 0.0–1.0)
BKR WAM PLATELETS: 154 x1000/ÂµL (ref 150–420)
BKR WAM RDW-CV: 15.8 % — ABNORMAL HIGH (ref 11.0–15.0)
BKR WAM RED BLOOD CELL COUNT.: 4.17 M/ÂµL (ref 4.00–6.00)
BKR WAM WHITE BLOOD CELL COUNT: 6.3 x1000/ÂµL (ref 4.0–11.0)

## 2022-09-15 LAB — PT/INR AND PTT (BH GH L LMW YH)
BKR INR: 1.13 mg/dL — ABNORMAL HIGH (ref 0.86–1.12)
BKR PARTIAL THROMBOPLASTIN TIME: 27.3 seconds (ref 23.0–31.4)
BKR PROTHROMBIN TIME: 12.4 seconds — ABNORMAL HIGH (ref 9.6–12.3)

## 2022-09-15 LAB — PHOSPHORUS     (BH GH L LMW YH): BKR PHOSPHORUS: 4 mg/dL (ref 2.2–4.5)

## 2022-09-15 LAB — MAGNESIUM: BKR MAGNESIUM: 1.8 mg/dL (ref 1.7–2.4)

## 2022-09-15 MED ORDER — MAGNESIUM OXIDE 400 MG (241.3 MG MAGNESIUM) TABLET
400 | Freq: Once | ORAL | Status: CP
Start: 2022-09-15 — End: ?
  Administered 2022-09-15: 13:00:00 400 mg (241.3 mg magnesium) via ORAL

## 2022-09-15 MED ORDER — NITROFURANTOIN MONOHYDRATE/MACROCRYSTALS 100 MG CAPSULE
100 | ORAL_CAPSULE | Freq: Two times a day (BID) | ORAL | 1 refills | Status: AC
Start: 2022-09-15 — End: ?

## 2022-09-15 MED ORDER — ACETAMINOPHEN 325 MG TABLET
325 | ORAL_TABLET | Freq: Four times a day (QID) | ORAL | 1 refills | Status: AC | PRN
Start: 2022-09-15 — End: ?

## 2022-09-15 MED ORDER — POTASSIUM CHLORIDE ER 20 MEQ TABLET,EXTENDED RELEASE(PART/CRYST)
20 | Freq: Once | ORAL | Status: CP
Start: 2022-09-15 — End: ?
  Administered 2022-09-15: 13:00:00 20 MEQ via ORAL

## 2022-09-15 MED ORDER — CEFTRIAXONE IV PUSH 1000 MG VIAL & NS (ADULTS)
Freq: Once | INTRAVENOUS | Status: CP
Start: 2022-09-15 — End: ?
  Administered 2022-09-15: 18:00:00 10.000 mL via INTRAVENOUS

## 2022-09-15 MED ORDER — CEPHALEXIN 500 MG CAPSULE
500 | Status: AC
Start: 2022-09-15 — End: ?

## 2022-09-15 MED ORDER — SENNOSIDES 8.6 MG TABLET
8.6 | ORAL_TABLET | Freq: Every evening | ORAL | 12 refills | Status: AC | PRN
Start: 2022-09-15 — End: ?

## 2022-09-15 MED ORDER — FUROSEMIDE 20 MG TABLET
20 mg | Freq: Every day | ORAL | Status: DC
Start: 2022-09-15 — End: 2022-09-15
  Administered 2022-09-15: 13:00:00 20 mg via ORAL

## 2022-09-15 NOTE — Plan of Care
Problem: Adult Inpatient Plan of CareGoal: Plan of Care ReviewOutcome: Interventions implemented as appropriate Problem: Adult Inpatient Plan of CareGoal: Patient-Specific Goal (Individualized)Outcome: Interventions implemented as appropriate Problem: Adult Inpatient Plan of CareGoal: Optimal Comfort and WellbeingOutcome: Interventions implemented as appropriate Problem: Fall Injury RiskGoal: Absence of Fall and Fall-Related InjuryOutcome: Interventions implemented as appropriate Problem: Urinary RetentionGoal: Effective Urinary EliminationOutcome: Interventions implemented as appropriate Plan of Care Overview/ Patient Status    Neuro: A&Ox4, no complaints of pain. Extremities warm, pulses palpable, no numbness or tingling. CV: NSR on telemetry. No c/o chest pain or palpitations. Resp: Equal and unlabored, no sob or doe. Satting >93% on room air. GI: WDLGU: Foley catheter in place for acute urinary retention draining cyu. Tolerating well. Foley care provided. Skin: WDLMS/Mobility: Stand by assist with cane. Bed alarm on for safety. Lines: X1 PIV patent and asymptomatic. Other:

## 2022-09-15 NOTE — ED Provider Notes
Chief Complaint Patient presents with  Urinary Catheter Problem   Was seen yesterday at [facility] for urinary retention, had foley placed and was discharged today. Pt coming in w/ complaint that his leg bag is leaking where the tube inserts to the bag. Pt needs new leg bag.  HPI/PE:Patient seen in the emergency department yesterday for urinary retention.  They placed a Foley.  They gave him a leg bag and now the leg bag itself is leaking.  No other symptoms.  Physical ExamED Triage Vitals [09/15/22 1822]BP: (!) 170/75Pulse: 69Pulse from  O2 sat: n/aResp: 16Temp: 98.2 ?F (36.8 ?C)Temp src: TemporalSpO2: 99 % BP (!) 170/75  - Pulse 69  - Temp 98.2 ?F (36.8 ?C) (Temporal)  - Resp 16  - SpO2 99% Physical ExamConstitutional:     Appearance: Normal appearance. HENT:    Ears:    Comments: Hard of hearingEyes:    General: No scleral icterus.Pulmonary:    Effort: Respiratory distress present. Skin:   Findings: No rash. Neurological:    Mental Status: He is alert. Psychiatric:       Mood and Affect: Mood normal.  ProceduresAttestation/Critical CarePatient Reevaluation: Pt with foley bag leakage. Clinical Impressions as of 09/15/22 1934 Urinary catheter complication, initial encounter [facility] Code) [facility] CODE) [facility] Code)  ED DispositionDischarge Synetta Fail, MD07/18/24 1933 Synetta Fail, MD07/18/24 1935

## 2022-09-15 NOTE — Plan of Care
Plan of Care Overview/ Patient Status    0700-1500A&Ox4. HOH, bilat hearing aids in place. SB/SR on tele HR 50-60's. Lungs diminished bilat on RA. No complaints of CP/SOB/palps. PO K and Mg repleted this AM per MAR. Foley in place draining yellow urine. Skin intact. OOB assist x1 with cane. Daughter at bedside. Plan for discharge upon receiving next dose if IVP ceftriaxone early this afternoon with foley in place. Safety maintained. All valuables reconciled and sent home.Problem: Adult Inpatient Plan of CareGoal: Plan of Care ReviewOutcome: Interventions implemented as appropriateGoal: Patient-Specific Goal (Individualized)Outcome: Interventions implemented as appropriateGoal: Absence of Hospital-Acquired Illness or InjuryOutcome: Interventions implemented as appropriateGoal: Optimal Comfort and WellbeingOutcome: Interventions implemented as appropriateGoal: Readiness for Transition of CareOutcome: Interventions implemented as appropriate Problem: Wound Healing ProgressionGoal: Optimal Wound HealingOutcome: Interventions implemented as appropriate Problem: Fall Injury RiskGoal: Absence of Fall and Fall-Related InjuryOutcome: Interventions implemented as appropriate Problem: Urinary RetentionGoal: Effective Urinary EliminationOutcome: Interventions implemented as appropriate

## 2022-09-15 NOTE — ED Notes
7:09 PM Pt presents to ED CC foley leaking.  This RN emptied and replaced foley bag and leg tag.  Awaiting provider. 7:30 PM  Pt seen and cleared for discharge by Dr. Josetta Huddle.  Pt discharged, pt stated understanding of instructions and follow up with urologist.  Pt left by wheel chair in NAD.

## 2022-09-15 NOTE — Discharge Instructions
Take Macrobid twice daily for 7 days to treat UTI. You will need to follow up with your primary care doctor within a week.  Call your doctor's office as soon as possible to schedule an appointment and to let your doctor know that your condition required a visit to the Emergency Room.Keep in mind that mild or early diseases may not be as obvious to [place], therefore return to the Emergency Department with any new, worsening or concerning symptoms including but not limited to: - Worsening of your current symptoms- Chest pain and/or shortness of breath- Fever above 100.4- Vomiting that does not stop- Any other symptoms that you find concerningFoley care as per nursing protocolThank you for trusting your care to [place]. It was our privilege to care for you today. Please do not hesitate to return to the Emergency Department should you feel the need.

## 2022-09-15 NOTE — Telephone Encounter
Patient needs:Nurse Visit: Within 7 - 10 days. Voiding Trial = RN instills NS into existing foley until patient feels urge to void. Foley then pulled an output measured. PVR as needed. If PVR > 350: Replace Foley catheter Future Appointments     Provider Department Center  11/11/2022 1:15 PM (Arrive by 1:00 PM) [facility] ECHO 1 [facility] [place]  01/19/2023 11:40 AM [name]., MD [facility] Cardiovascular Medicine at [address] [facility] Cardio

## 2022-09-15 NOTE — Care Coordination-Inpatient
Call to daughter [name] to alert her of the confirmation of home careServices now with [facility] at home for skilled nursing and home physical therapyDuring the phone call the daughter told the CM that the foley catheter System was leaking.  CC had requested [facility] provide a checkVia phone call which was made to the daughter with an effort to try toArrange an urgent visit, but this was not confirmed if they could accommodateThe nursing staff has called back the daughter to try to trouble shoot over the phoneBut unable to confirm what was actually leakingWith the concern the daughter [name] had they discussed an Urgent CareVisit possibly or [facility] as an alternative.  The patient did not wantTo have to go through the night with incontinence for which he is wearing a diaperCM will follow up in AM with daughter to see if they had resolution of the issueMaureen Kyung Rudd, RN [facility] Care Coordinator

## 2022-09-15 NOTE — Plan of Care
Problem: Adult Inpatient Plan of CareGoal: Readiness for Transition of CareOutcome: Interventions implemented as appropriate Plan of Care Overview/ Patient Status    Patient with a PMH of BPH, HTN, HLD, CKD, and CAD Admit with abdominal pain/ urinary retention. Foley catheter placed + UTI. Vascular surgery is consulted for evaluation of his enlarging AAA. Case Management Screening  Flowsheet Row Most Recent Value Case Management Screening: Chart review completed. If YES to any question below then proceed to CM Eval/Plan  Is there a change in their cognitive function No Do you anticipate that the pt will have any discharge needs requiring CM intervention? Yes Has there been a readmission within the last 30 days and/or four (4) encounters (encounters include: ED, OBS, Inpatient) within the last six (6) months? No Were there services prior to admission ( Examples: Assisted Living, HD, Homecare, Extended Care Facility, Methadone, SNF, Outpatient Infusion Center) No Negative/Positive Screen Positive Screening: Complete CM Evaluation and Plan Case Manager Attestation  I have reviewed the medical record and completed the above screen. CM staff will follow patient's progress and discuss the plan of care with the Treatment Team. Yes  Case Management Evaluation  Flowsheet Row Most Recent Value Case Management Evaluation and Plan  Arrived from prior to admission home/apartment/condo Do you have a caregiver, or do you anticipate the need for a caregiver given the change in your physicial function? Yes Permission to Speak to Caregiver? --  [daughter]  Lives With Child(ren), Adult  [daughter] Services Prior to Admission --  [has had PT services] Patient Requires Care Coordination Intervention Due To discharge planning needs/concerns, change in physical function Prior to Hospitalization: Assistance Needed/DME being used Ambulation Ambulation Assistance/DME: straight cane, rolling walker Documented Insurance Accurate Yes  [[employer] MCR] Any financial concerns related to anticipated discharge needs No Patient's home address verified Yes Patient's PCP of record verified Yes Living Environment   Lives With Child(ren), Adult  [daughter] Current Living Arrangements home/apartment/condo Source of Clinical History  Patient's clinical history has been reviewed and source of Information is: Patient, Medical record, Child(ren) Case Manager Attestation  I have reviewed the medical record and completed the above evaluation with the following recommendations. Yes Discharge Planning Coordination Recommendations  Discharge Planning Coordination Recommendations Home with Homecare Services  Case Management Plan  Flowsheet Row Most Recent Value Discharge Planning  Patient/Patient Representative goals/treatment preferences for discharge are:  to go home Patient/Patient Representative was presented with a list of facilities, agencies and/or dme providers and Referral(s) placed for: Homecare services Homecare Preference(s) none Home Health Care Services Required Nursing, Physical Therapy Mode of Transportation  Private car  (add comment for special considerations) Patient accompanied by daughter CM D/C Readiness  PASRR completed and approved N/A Authorization number obtained, if required N/A Is there a 3 day INPATIENT Qualifying stay for Medicare Patients? N/A Medicare IM- signed, dated, timed and scanned, if required N/A DME Authorized/Delivered N/A No needs identified/ follow up with PCP/MD N/A Post acute care services secured W10 complete Yes Pri Completed and Accepted  N/A Is the destination address correct on the W10 N/A Finalized Plan  Expected Discharge Date 09/15/22 Discharge Disposition Home-Health Care Svc  Referring back to [facility]  patient  for PT and Mills Koller, RN [facility] Care Coordinator

## 2022-09-15 NOTE — Progress Notes
Vascular SurgeryProgress Note Demarquez [id number] Hospital Day: 2    HPI  Mark [name] is a 89 y.o. male with a history of BPH, HTN, HLD, CKD, and CAD presenting with abdominal pain/ urinary retention. The patient had a foley catheter placed and had some relief of his abdominal pain in the ED. A U/A was performed in the ED and appears to be + for UTI. A [place] scan was performed that shows an increase in size of his known AAA to 5.2 cm x 4.5 cm. Per pt his cardiologist has been monitoring the size of his aneurysm. The patient was seen urgently in 2021 for his AAA. At the time the plan was for the pt to follow up in the vascular clinic to establish care. Pt never followed up. Vascular surgery is consulted for evaluation of his enlarging AAA. Reports improvement in his abdominal pain this morning. He is only slightly tender to palpation in the LLQ. Denies chest pain, SOB, or fevers. OBJECTIVE  Vitals:Temp:  [97.6 ?F (36.4 ?C)-98.8 ?F (37.1 ?C)] 98.1 ?F (36.7 ?C)Pulse:  [59-69] 59Resp:  [16-18] 18BP: (147-186)/(66-82) 159/66SpO2:  [96 %-99 %] 97 %Device (Oxygen Therapy): room airI&Os:I/O last 3 completed shifts:In: - Out: 700 [Urine:700]Physical Exam:Gen: NADCV: RRR, no murmursResp: breathing comfortablyAbd: soft, tende to deep palpation in LLQ, nondistended, Ext: WWP, palp DP/PTsLaboratory:CBCLab Results Component Value Date  WBC 6.3 09/15/2022  HGB 9.8 (L) 09/15/2022  HCT 31.10 (L) 09/15/2022  PLT 154 09/15/2022 ElectrolytesLab Results Component Value Date  NA 144 09/15/2022  K 3.9 09/15/2022  CL 109 (H) 09/15/2022  CO2 23 09/15/2022  CREATININE 1.81 (H) 09/15/2022  BUN 28 (H) 09/15/2022  GLU 99 09/15/2022  CALCIUM 8.7 (L) 09/15/2022  MG 1.8 09/15/2022  PHOS 4.0 09/15/2022 LFTsLab Results Component Value Date  BILITOT 1.2 04/07/2022  BILIDIR 0.2 04/07/2022  AST 43 (H) 04/07/2022 ALT 31 04/07/2022  ALKPHOS 66 04/07/2022  LIPASE 23 04/07/2022 CoagLab Results Component Value Date  INR 1.13 (H) 09/15/2022  PTT 27.3 09/15/2022 Imaging:none ASSESSMENT AND PLAN  Mark [name] is a 89 y.o. male with a history of BPH, HTN, HLD, CKD, and CAD presenting with abdominal pain/ urinary retention. The patient had a foley catheter placed and had some relief of his abdominal pain in the ED. A U/A was performed in the ED and appears to be + for UTI. A [place] scan was performed that shows an increase in size of his known AAA to 5.2 cm x 4.5 cm. Per pt his cardiologist has been monitoring the size of his aneurysm. The patient was seen urgently in 2021 for his AAA. At the time the plan was for the pt to follow up in the vascular clinic to establish care. Pt never followed up. Vascular surgery is consulted for evaluation of his enlarging AAA.Planned admission for observation and ensure resolution of abdominal painUTI and urinary retention in the ED. Ceftriaxone given and will have a course of bactrim.Urology follow-up for void trialWill monitor his AAA for growth. No surgical intervention at this time.Follow-up with Dr. Dinah Beers in 1 year with [place] a/p noncontrast.Medically stable for discharge today.For questions, please page: Vascular Surgery Floor [phone]. Avail via [facility].All notes preliminary until final attending attestation.Signed:Keanen Dohse, MDPGY-1 Vascular Surgery Resident07/18/24===
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
## 2022-09-16 ENCOUNTER — Encounter: Admit: 2022-09-16 | Payer: PRIVATE HEALTH INSURANCE | Primary: Internal Medicine

## 2022-09-16 LAB — URINE CULTURE

## 2022-09-17 ENCOUNTER — Encounter: Admit: 2022-09-17 | Payer: PRIVATE HEALTH INSURANCE | Primary: Internal Medicine

## 2022-09-17 NOTE — Progress Notes
[facility] Care Navigation:  Transition of Care Note Care Navigation spoke with: Patientspoke with both patient and his daughter [facility]: [facility] Admission Date: 7/17/2024Hospital Discharge Date: 09/15/2022    Diagnosis: Urinary RetentionDischarge location: HomeRisk of Unplanned Readmission: 23.64%HOSPITALIZATION:Reason patient admitted: 89 y/o male with hx BPH, HTN, HLD, CKD, CAD presents with abdominal pain/urinary retention. Pt states over the past few days he has only been urinating small amounts, feels like he is not completely emptying his bladder. This morning he developed lower abdominal pain radiating into R flank. Feels similar to when he had urinary retention in the past, required foley. Diagnosis at discharge: Urinary RetentionCURRENT STATE:Since discharge patient reports feeling: Betterbetter regarding urinary retention ie; foley in and draining urine but upset about the hospital stay and care he received. went home without VNA set-up. needs help with catheter. states daughter is working on this with PCP and [employer] symptoms reported include: NonePatient cared for by: [employer] with daughter who helps with his careQuestions regarding surgical site: No. N/AREVIEW OF AFTER VISIT SUMMARY DOCUMENT:Patient specific questions regarding discharge: AVS not readily available. Reviewed instructions with him. Taking antibiotic as directedIndwelling foley--states he needs nursing help to change bag. Daughter working on arranging this with PCPReports foley is draining wellAmbulating--states stairs are tough--PT coming to work with him per daughterF/u with PCP and urology per patient and daughterMEDICATION CHANGES:Validated NEW medications to take: Hildred Alamin belowValidated Changed medications to take: Hildred Alamin belowValidated Stopped medications to NOT take: N/AIssues obtaining prescriptions: Nostates did not get senna but takes Metamucil and is moving bowels regularlyAdditional medication related notes: START taking: acetaminophen (TYLENOL) nitrofurantoin (macrocrystal-monohydrate) (MACROB ID) senna (SENOKOT) CHANGE how you take: tamsulosin [facility]) - Another medication with the same name was removed. Continue taking this medication, and follow the directions you see here. CONTINUE taking your other medicationsFOLLOW-UP APPOINTMENTS and TRANSPORTATION:Patient aware of scheduled appointments: NoAwareness and assistance with appointments needing to be scheduled: Nodaughter scheduling f/u appt with PCP and UrologyTransportation concerns for follow-up appointment: Nodaughter to transportDME and HOME HEALTH SERVICES:Durable medical equipment received: N/AContact has been made with home care agency: Notrying to get homecare (VNA) set up. daughter working with PCP and/or urologist to get [employer] at [employer] established for follow up labs/tests: N/AOther follow up items: Foley catheterADDITIONAL PATIENT NEEDS:Identified Social Determinants of Health opportunities: No issues per patient  Additional patient needs addressed: Very unhappy with care received at [facility]Additional issues/barriers identified: Very unhappy with care he received at [facility]

## 2022-09-18 ENCOUNTER — Encounter: Admit: 2022-09-18 | Payer: PRIVATE HEALTH INSURANCE | Primary: Internal Medicine

## 2022-09-18 NOTE — ED Provider Notes
Chief Complaint Patient presents with  Urinary Retention   arrives with urinary retention and lower abdomenal pain radiating to RLQ to right flank, patient is able to urinate however only able to urinate small amounts at a time, +bladder fullness. took flomax yesterday. Patient denies dysuria/hematura MDM:89 y/o male with hx BPH, HTN, HLD, CKD, CAD presents with abdominal pain/urinary retention. Pt states over the past few days he has only been urinating small amounts, feels like he is not completely emptying his bladder. This morning he developed lower abdominal pain radiating into R flank. Feels similar to when he had urinary retention in the past, required foley. Denies fevers/chills, nausea/vomiting, chest pain, SOB, dysuria, hematuria, diarrhea. BP (!) 188/91  - Pulse 70  - Temp 97.8 ?F (36.6 ?C) (Temporal)  - Resp 18  - SpO2 99% Well appearing. Abdomen distended with lower abdominal tenderness. No CVA tenderness. MDM: Concern for urinary retention, will bladder scan- pt will likely need foley. Concern for UTI, UA ordered. Evaluate for leukocytosis, electrolyte disturbance, AKI with CBC, BMP. [place] flank ordered to rule out nephrolithiasis. Bladder scan with . Will have pt attempt to void. 1050PVR with . Will place foley. Creatinine 2.1, similar to priors. [place] shows bilateral mild to moderate hydroureteronephrosis, no stone. Pt also with enlarging infrarenal aortic aneurysm. Vascular surgery consulted. Discussed with pharmacy and will treat with augmentin for UTI based on prior cultures. 1530Signed out to oncoming team. Pending final vascular surgery recommendations. Likely d/c on Augmentin for UTI. Case discussed with Dr. [name] PA-C--------------------------------------- Resident Note----------------------------------------------MD Course: Received sign-out from daytime provider pending vascular surgery recommendations. Discussed management with vascular surgery; given abdominal pain and concern for enlarging AAA, we will plan for admission to their service.  We will discuss with urology with regards to indwelling Foley catheter.Patient care and medical decision making occurred with oversight by [name], MD-------------------------------------------------------------------------------------------------------- [name], MDEmergency Medicine, PGY-3Available on Mobile Heartbeat [phone] device may have been used, please excuse any spelling, word substitution or punctuation errors.   Physical ExamED Triage Vitals [09/14/22 1022]BP: (!) 188/91Pulse: 70Pulse from  O2 sat: n/aResp: 18Temp: 97.8 ?F (36.6 ?C)Temp src: TemporalSpO2: 99 % BP (!) 170/78  - Pulse 69  - Temp 98 ?F (36.7 ?C) (Oral)  - Resp 16  - SpO2 98% Physical Exam ProceduresAttestation/Critical CarePatient Reevaluation: ED Attestation: PA/APRNFace to face evaluation was performed by me in collaboration with the Advanced Practice Provider to assess for significant health threats. I provided a substantive portion of the care of this patient.  I personally performed medically appropriate history and physical exam and the MDM: On my exam:My differential includes:Additional acute and/or chronic problems addressed:Mark Aydin24 year old male with past medical history of hypotension, hyperlipidemia, chronic CAD, BPH with urinary retention, presents to the Emergency Department with with complaints of abdominal pain and distention.  The patient states he is having difficulty urinating over the last week, stating that he does not feel like he is fully emptying his bladder.  He does complain of some pain to his right lower abdomen.  He states that he did take Flomax yesterday without any relief.  He denies any dysuria hematuria, back or flank pain, chest pain, palpitations, shortness breath, palpitations.  He denies any trauma or falls.BP (!) 188/91  - Pulse 70  - Temp 97.8 ?F (36.6 ?C) (Temporal)  - Resp 18  - SpO2 99% Gen: NAD, resting comfortably on a stretcher; hard of hearingCV: RRRRespir: CTA b/l; no r/r/wAbd: soft; no rebound; abdominal distention, tenderness to palpation  in right lower/right midabdomen without guardingMDM/PLAN:-- consider bladder distention, urinary retention; will perform bedside bladder scan, will catheterize of patient retention -- consider UTI, will obtain UA -- with an otherwise unremarkable evaluation, consider additional etiologies of right-sided pain, including possible appendicitis, will send labs, possible imaging Mark [name], MD11:37 AMPatient with UTI, will treat Patient urinated in the emergency department, continued urinary retention despite urinating; Foley catheter placed Mark [name], MD1:10 PMClear yellow urine from Foley catheter Given presentation of significant abdominal pain on right side, discussed the risks of [place] imaging with the patient, who is amenable with imaging [place]: 1. Bilateral mild-to-moderate hydroureteronephrosis without evidence of nephrolithiasis or obstructing focal lesion. Etiology is likely secondary to bladder outflow obstruction in the setting of prostatomegaly. 2. Interval enlargement of known infrarenal aortic aneurysm measuring 5.2 x 4.5 cm (previously 4.5 x 4.3 cm). Vascular surgery consult for incidentally noted enlarging infrarenal AAA, awaiting their recommendationsAni [name], MDPatient seen and evaluated by vascular surgery.  Given enlarged aorta, recommending admission to their service for observation.Patient, in his daughter, were informed about all laboratory imaging test results with his permission.  Patient be treated for UTI.  Urology consult for inpatient evaluation for urinary retention requiring Foley catheter placement, will require outpatient follow up.Comments as of 09/14/22 1650 Wed Sep 14, 2022 1532 Received sign-out from daytime provider.  Gentleman with indwelling Foley secondary to prostatomegaly; concern for UTI and we will start on Augmentin.  Noted to have enlarged AAA; follow up vascular recs.  Anticipate discharge to outpatient neurology follow up. [SM]  Comments User Index[SM] Reino Kent, MD   Clinical Impressions as of 09/14/22 1650 Urinary retention Urinary tract infection without hematuria, site unspecified Infrarenal abdominal aortic aneurysm (AAA) without rupture [facility] Code)  ED DispositionAdmit Julio Sicks, PA07/17/24 1553 [address], Mark, MD07/17/24 1650 Reino Kent, MDResident07/18/24 [phone]

## 2022-09-19 ENCOUNTER — Telehealth: Admit: 2022-09-19 | Payer: PRIVATE HEALTH INSURANCE | Attending: Cardiovascular Disease | Primary: Internal Medicine

## 2022-09-19 ENCOUNTER — Encounter: Admit: 2022-09-19 | Payer: PRIVATE HEALTH INSURANCE | Primary: Internal Medicine

## 2022-09-19 NOTE — Telephone Encounter
Patients daughter is requesting to know if September is too long to wait before her father has his echo. She is worried about his current state.

## 2022-09-19 NOTE — Telephone Encounter
Left msg for dtr to let her know we do have a few openings sooner for the echo in the [facility] office

## 2022-09-19 NOTE — Telephone Encounter
Per [place] scan 09/14/2022 2. Interval enlargement of known infrarenal aortic aneurysm measuring 5.2 x 4.5 cm (previously 4.5 x 4.3 cm). Pt's dtr concerned her father has had increased fatigue. Advised dtr I will have the secretaries look at the schedule. Dtr is willing to travel. Also, advised dtr her father can have the echo performed at any facility. Sent ECHO order to pt via mychart

## 2022-09-19 NOTE — Telephone Encounter
I spoke with Mark [name] and his daughter. Scheduled his void trial 7/25. Pt ambulates with walker/cane.

## 2022-09-20 ENCOUNTER — Encounter: Admit: 2022-09-20 | Payer: PRIVATE HEALTH INSURANCE | Primary: Internal Medicine
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
## 2022-09-21 ENCOUNTER — Telehealth: Admit: 2022-09-21 | Payer: PRIVATE HEALTH INSURANCE | Attending: Cardiovascular Disease | Primary: Internal Medicine

## 2022-09-21 NOTE — Telephone Encounter
Pt's daughter called stating she spoke with [facility] regarding her father. She said that they can get her father in sooner for a stress and echo test next week. There number is [phone] fax number [phone]. They need there prior authorization form sent over the NPI number is [id number]

## 2022-09-21 NOTE — Telephone Encounter
Spoke with Trish at [facility] I made her aware there is no current stress test order, only echo order. I will ask Dr. Frutoso Chase if he would like to order a stress test. She states in order for the patient to have any testing done at their facility, the pre-authorization needs to be performed by [facility] and then passed on to [facility], along with the order requisition. An ETT would not require pre-authorization. The facility's NPI # is [id number], which is because they are a testing facility only, testing does not go through a particular MD there.  I made her aware that unfortunately the office she is calling does not perform pre-auths, this is done via the hospital. I will forward to someone who may be able to help. I see that per Dr. Frutoso Chase, pt was to have echo to evaluate for extent of aortic stenosis. Most likely stress test not to be ordered until this is determined.

## 2022-09-22 ENCOUNTER — Ambulatory Visit: Admit: 2022-09-22 | Payer: PRIVATE HEALTH INSURANCE | Primary: Internal Medicine

## 2022-09-22 NOTE — Telephone Encounter
Pt's daughter called and stated that he needs to follow up with a Vascular Dr and also that his Bp has been elevated. Please advise , she also stated that she has called several times.

## 2022-09-22 NOTE — Telephone Encounter
Spoke w/ SusanShe states BP has been running 150's/80's since before last hospitalization 7/17/24Last night BP was 128/60's120/70's this am with visiting RNSusan confirms that pt has stopped the Amlodipine per lov w/ JN on 5/13 Pt confirms he is taking Coreg 12.5 mg BID, Losartan 25 mg QD, and Imdur 30 mg QD Bernerd Limbo to have pt continue same medications and continue monitoring BPAdvised to call office if BP consistently elevated over several daysShe verbalized understandingPlease adviseIf anything else to add or any changes need to be madeThank you

## 2022-09-23 ENCOUNTER — Telehealth: Admit: 2022-09-23 | Payer: PRIVATE HEALTH INSURANCE | Attending: Diagnostic Radiology | Primary: Internal Medicine

## 2022-09-23 NOTE — Telephone Encounter
Mark [name]., MD  [facility] Nurse Triage PoolCaller: Unspecified (2 days ago, 10:14 AM)It seems like pt was hospitalized at [facility]?I don't have any recordsHe should be seen soon, either by me or an aprnAs for an echo, if they want it to be done at [facility], her PCP can do the prior authorization

## 2022-09-23 NOTE — Telephone Encounter
Spoke with [name] patient's daughter.She will call PCP and have echo scheduled at [facility] to monitor aortic stenosis. Had [place] scan 09/14/22 that revealed an infra renal aneurysm.Dr. Frutoso Chase would like patient to have and earlier visit with himself or APP.Monitoring BP daily. They will call PCP is systolic over 150 mm hg.PCP d/c'd Amiodarone and Losartan decreased to 12.5 mg. Low in office.

## 2022-09-23 NOTE — Telephone Encounter
Entered in error

## 2022-09-29 ENCOUNTER — Encounter: Admit: 2022-09-29 | Payer: PRIVATE HEALTH INSURANCE | Primary: Internal Medicine

## 2022-10-04 ENCOUNTER — Ambulatory Visit: Admit: 2022-10-04 | Payer: PRIVATE HEALTH INSURANCE | Attending: Vascular Surgery | Primary: Internal Medicine

## 2022-10-06 ENCOUNTER — Encounter: Admit: 2022-10-06 | Payer: PRIVATE HEALTH INSURANCE | Attending: Cardiovascular Disease | Primary: Internal Medicine

## 2022-10-06 ENCOUNTER — Ambulatory Visit: Admit: 2022-10-06 | Payer: PRIVATE HEALTH INSURANCE | Attending: Cardiovascular Disease | Primary: Internal Medicine

## 2022-10-06 DIAGNOSIS — E785 Hyperlipidemia, unspecified: Secondary | ICD-10-CM

## 2022-10-06 DIAGNOSIS — I2510 Atherosclerotic heart disease of native coronary artery without angina pectoris: Secondary | ICD-10-CM

## 2022-10-06 DIAGNOSIS — I10 Essential (primary) hypertension: Secondary | ICD-10-CM

## 2022-10-06 NOTE — Patient Instructions
[facility]ellow [facility] Alinda Money [phone],[place]

## 2022-10-06 NOTE — Progress Notes
[facility] Cardiology Office VisitInterval History: This is a 89 y.o. male with a history of known coronary artery disease status post remote history of an IMI by patient's history, cardiac stenting, longstanding, well controlled hypertension, s/p left carotid endarterectomy, with a history of a known abdominal aortic aneurysm  who returns for follow up. He was last seen on 07/11/22. He was hospitalized from 09/14/22-09/15/22, after presenting to the ED with urinary retention and abdominal pain. He had a foley catheter inserted with some relief. He was found to have a UTI. A [place] scan was performed that shows an increase in size of his known AAA to 5.2 cm x 4.5 cm. . He was treated with ceftriazone and bactrim, with urology follow up for voiding trial. Today, the patient reports feeling well. He reports he no longer has a foley catheter in place. He reports that he was told he will need to undergo a minimally invasive repair of his aortic aneurysm, with stenting, but he wants to undergo the procedure at [facility]. He reports his breathing has been stable, however reports that he continues to experiencing stable dyspnea on exertion that has not worsened over the last few months. Clinically, he has been feeling well from a cardiovascular standpoint. He denies experiencing any chest pain or palpitations. No lightheadedness, shortness of breath, orthopnea or PND.  He denies experiencing any peripheral edema or claudication. No syncope, falls, or loss of consciousness. He is compliant with his medications. He has been monitoring his salt intake.  Social History: He lives with his daughter.  his wife passed away several months ago.His mother had CAD. He has 3 daughters. 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 He drinks 2 cups coffee daily, no alcohol. Review of Allergies/Medical History/Medications: Past Medical History: Diagnosis Date  Chronic coronary artery disease   Hyperlipidemia   Hypertension  Past Surgical History: Procedure Laterality Date  APPENDECTOMY    BLADDER SURGERY  03/01/2019  CHOLECYSTECTOMY    FRACTURE SURGERY    rifgt hand Outpatient Medications Marked as Taking for the 10/06/22 encounter (Follow Up) with [name]., MD Medication Sig Dispense Refill  acetaminophen (TYLENOL) 325 mg tablet Take 2 tablets (650 mg total) by mouth every 6 (six) hours as needed for pain. 30 tablet 0  aspirin 81 mg EC delayed release tablet Take 1 tablet (81 mg total) by mouth daily.    atorvastatin (LIPITOR) 80 mg tablet Take 1 tablet (80 mg total) by mouth daily.    b complex vitamins capsule Take 1 capsule by mouth daily.    bismuth subsalicylate (PEPTO BISMOL) 262 mg chewable tablet Take 1 tablet (262 mg total) by mouth 4 (four) times daily as needed.    carvediloL (COREG) 12.5 mg Immediate Release tablet TAKE 1 TABLET BY MOUTH TWICE  DAILY WITH BREAKFAST AND DINNER 180 tablet 3  clopidogreL (PLAVIX) 75 mg tablet Take 1 tablet (75 mg total) by mouth daily.    escitalopram oxalate (LEXAPRO) 5 mg tablet Take 0.5 tablets (2.5 mg total) by mouth daily.    furosemide (LASIX) 20 mg tablet Take 1 tablet (20 mg total) by mouth daily.    gabapentin (NEURONTIN) 100 mg capsule Take 2 capsules (200 mg total) by mouth nightly.    ipratropium (ATROVENT) 21 mcg (0.03 %) nasal spray USE 2 SPRAYS IN BOTH  NOSTRILS TWICE DAILY    isosorbide mononitrate (IMDUR) 30 mg 24 hr extended release tablet Take 1 tablet (30 mg total)  by mouth daily.    ketoconazole (NIZORAL) 2 % cream Apply topically 2 (two) times daily. To affected area on buttock 30 g 2  losartan (COZAAR) 25 mg tablet Take 1 tablet (25 mg total) by mouth daily.    multivitamin tablet Take 1 tablet by mouth daily.    nitroGLYCERIN (NITROSTAT) 0.4 mg SL tablet Place 1 tablet (0.4 mg total) under the tongue every 5 (five) minutes as needed for chest pain. If no relief, call 911. May repeat every 5 minutes for a total of 3 tablets. 25 tablet 3  omeprazole (PRILOSEC) 20 mg capsule Take 1 capsule (20 mg total) by mouth daily.    senna (SENOKOT) 8.6 mg tablet Take 1 tablet (8.6 mg total) by mouth nightly as needed for constipation. 30 tablet 11  tamsulosin (FLOMAX) 0.4 mg 24 hr capsule Take 1 capsule (0.4 mg total) by mouth 2 (two) times daily.   Allergies Allergen Reactions  Ace Inhibitors Cough Review of Systems: Review of Systems Constitutional: Negative.  HENT: Negative.   Eyes: Negative.  Respiratory: Negative.   Cardiovascular: Negative.  Gastrointestinal: Negative.  Genitourinary: Negative.  Musculoskeletal: Negative.  Skin: Negative.  Neurological: Negative.  Endo/Heme/Allergies: Negative.  Psychiatric/Behavioral: Negative.   All other systems reviewed and are negative.Physical Exam: Vitals:  10/06/22 1242 BP: 116/66 Site: Left Arm Position: Sitting Cuff Size: Medium Pulse: 67 SpO2: 96% Weight: 98.9 kg Height: 5' 11 (1.803 m) PainSc:   0 - No pain BP 116/66 (Site: l a, Position: Sitting, Cuff Size: Medium)  - Pulse 67  - Ht 5' 11 (1.803 m)  - Wt 98.9 kg  - SpO2 96%  - BMI 30.40 kg/m?  Manual BP: 130/70Physical ExamConstitutional:     General: He is not in acute distress.   Appearance: He is well-developed. Neck:    Vascular: Normal carotid pulses. No carotid bruit, hepatojugular reflux or JVD. Cardiovascular:    Rate and Rhythm: Normal rate and regular rhythm.    Heart sounds: Murmur (soft of aortic stenosis, sinus murmur of AS) heard.    No friction rub.    Comments: Crisp S2Pulmonary:    Effort: Pulmonary effort is normal.    Breath sounds: Normal breath sounds. No wheezing or rales. Abdominal:    General: Bowel sounds are normal.    Palpations: Abdomen is soft.    Tenderness: There is no abdominal tenderness. There is no rebound. Musculoskeletal:       General: Normal range of motion.    Cervical back: Normal range of motion and neck supple.    Right lower leg: No edema.    Left lower leg: No edema. Skin:   General: Skin is warm and dry. Neurological:    Mental Status: He is alert and oriented to person, place, and time. Psychiatric:       Behavior: Behavior normal. Review of Labs/Diagnostics: ECG/Tele Events: Independently interpreted by me:  Normal sinus rhythm, left axis deviation, no acute ST or T-wave changesEcho Findings: Results for orders placed or performed during the hospital encounter of 01/01/21 Echo 2D Complete w Doppler and CFI if Ind Image Enhancement 3D and or bubbles Result Value Ref Range  Reported Biplane EF% 62 %  Narrative   * Normal left ventricular size and systolic function. Mild concentric left ventricular hypertrophy.  LVEF calculated by biplane Simpson's was 62%.  Abnormal tissue Doppler suggestive of abnormal diastolic function.* Normal right ventricular cavity size and systolic function.  Estimated right ventricular systolic pressure is 27 mmHg.* Atria are normal  in size* Aortic valve is trileaflet.  Mild aortic valve calcification.  Aortic sclerosis without stenosis.  Peak aortic velocity is 1.6 m/s.  Mean gradient is 6 mmHg.  Dimensionless index is 0.70.  Trace aortic regurgitation.* Mitral valve appears calcified.  Mild anterior and moderate posterior mitral annular calcification.  No mitral regurgitation.  No mitral stenosis.* IVC diameter < 2.1 cm that collapses > 50% with a sniff suggests normal RAP (0-5 mmHg, mean 3 mmHg).* No significant pericardial effusion* No prior study available for comparison Stress Test: No results found for this or any previous visit.Lab Review: Lab Results Component Value Date  CREATININE 1.81 (H) 09/15/2022  BUN 28 (H) 09/15/2022  NA 144 09/15/2022 K 3.9 09/15/2022  CL 109 (H) 09/15/2022  CO2 23 09/15/2022 Lab Results Component Value Date  CHOL 106 07/31/2019  LDL 51 07/31/2019  TRIG 67 07/31/2019  HDL 41 07/31/2019  Lab Results Component Value Date  HGBA1C 5.6 07/24/2019 LIPID PANEL REFLEX TO DIRECT LDL (EXTERNAL LAB)Order: [id number] ComponentRef Range & Units 12/23/21 10:35 AM Cholesterol Total External<200 mg/dL 98 Cholesterol HDL External> OR = 40 mg/dL 35 Low  GNFAOZHYQMVHQ<469 mg/dL 73 LDL Cholesterolmg/dL (calc) 48 Comment: Reference range: <100Desirable range <100 mg/dL for primary prevention;  <70 mg/dL for patients with CHD or diabetic patientswith > or = 2 CHD risk factors.LDL-C is now calculated using the Martin-Hopkinscalculation, which is a validated novel method providingbetter accuracy than the Friedewald equation in theestimation of LDL-C.Horald Pollen et al. Lenox Ahr. 6295;284(13): 2061-2068(http://education.[employer].com/faq/FAQ164) Cholesterol/HDL Ratio<5.0 (calc) 2.8 Non HDL Chol. (LDL+VLDL)<130 mg/dL (calc) 63 Recent Studies:  [facility] FLANK PAIN (UNDIFFERENTIATED, POSSIBLE KIDNEY STONE) YHHistory/Indication: Abdominal/flank pain, stone suspectedR sided abdominal, flank tenderness Technique: [place] images were obtained from the superior aspect of the kidneys to the pubic symphysis without administration of intravenous contrast utilizing a flank pain protocol. Coronal and sagittal multiplanar reformatted images were provided. Comparison: CTA ABDOMEN PELVIS W AND/OR WO IV CONTRAST 2020-07-16  FINDINGS:  KIDNEYS & URETERS: Left greater than right mild-to-moderate hydroureteronephrosis with dilation of the ureters visualized to the level of the urinary bladder. No stones or focal obstructing lesions are visualized. Simple renal cysts are again seen within the left kidney. No perinephric fluid collections are visualized.URINARY BLADDER: A urethral catheter is visualized within the bladder lumen. Diffuse bladder wall thickening is again noted, may be slightly greater posteriorly on the left than the right. The bladder is decompressed limiting evaluation. Intraluminal air is presumably secondary to instrumentation. LUNG BASES: Dependent atelectasis. Examination degraded by motion artifact.  LIVER: Unremarkable.GALLBLADDER: Surgically absent gallbladder.SPLEEN: Unremarkable.PANCREAS: Unremarkable.ADRENALS: Unremarkable. BOWEL: Unremarkable. PERITONEUM: Unremarkable.VESSELS: Interval enlargement of known infrarenal aortic aneurysm measuring 5.2 x 4.5 cm (previously 4.5 x 4.3 cm).PELVIS: Prostatomegaly with internal prostatic calcifications. BONES & SOFT TISSUE: Redemonstrated multilevel degenerative changes throughout the thoracolumbar spine. Vertebral body heights are grossly preserved. IMPRESSION:  1.         Bilateral mild-to-moderate hydroureteronephrosis without evidence of nephrolithiasis or obstructing focal lesion. Etiology is likely secondary to bladder outflow obstruction in the setting of prostatomegaly.2.         Interval enlargement of known infrarenal aortic aneurysm measuring 5.2 x 4.5 cm (previously 4.5 x 4.3 cm). [facility] Notify System Classification: Routine. Report initiated by:  Reola Calkins, MD Reported and signed by: Virl Axe, MD  [facility]----------------------------------------------------------------XR Chest Obliques        07/12/24Order: [id number] ImpressionNo acute cardiopulmonary findings.NarrativeHistory: shortness of breath, pedal edema, eval for CHF.Technique: PA and lateral images of the chest.Comparison: Chest exam dated  05/14/2021.Findings: Stable hyperinflated lungs. No lobar consolidation.  No pneumothorax, effusion, or free air. Normal heart size. Atherosclerotic ectatic stable appearing aorta. No pulmonary vascular congestion.  Stable multilevel degenerative changes of thethoracic spine.Exam End: 09/09/22  2:39 PM  Specimen Collected: 09/09/22  2:51 PM Last Resulted: 09/09/22  2:51 PM Received From: [facility]  Result Received: 09/09/22  3:13 PM Encounter Diagnosis:    ICD-10-CM  1. Coronary artery disease involving native coronary artery of native heart without angina pectoris  I25.10 NM Myocardial Perfusion SPECT (Stress and Rest) with Regadenoson  2. Dyslipidemia  E78.5 NM Myocardial Perfusion SPECT (Stress and Rest) with Regadenoson  3. Essential hypertension  I10 NM Myocardial Perfusion SPECT (Stress and Rest) with Regadenoson  Impression and Plan: 89 y.o.  male with known coronary artery disease status post remote history of an IMI by patient's history, longstanding, well controlled hypertension, status post a left carotid endarterectomy, with a history of a known abdominal aortic aneurysm returns for follow-up.  Since I last saw the patient, he was hospitalized with a UTI and underwent [place] imaging of his abdomen and was noted to have an increase in the size of his infrarenal abdominal aortic aneurysm to 5.2 cm.  He is contemplating undergoing endovascular repair.  Clinically he denies exertional chest pain.  He reports compliance with his medications.  He denies any change in his dyspnea.  He has not required nitroglycerin.  At his last visit, I stopped his amlodipine.  Today on exam, his blood pressure is 130/80, he is in sinus rhythm.  His cardiovascular exam is notable for a murmur of aortic stenosis.  There is trace peripheral edema.  His EKG was not repeated.  I reviewed all the above in detail with the patient in his daughter.  I will have him undergo pharmacological stress testing to ensure there is no significant myocardial ischemia.  Assuming the study is unremarkable, I feel, despite his age, that he would be an acceptable candidate to undergo endovascular repair of his abdominal aortic aneurysm.  I told the patient that this could be performed either a [place] or at [facility], whichever institution he is more comfortable having it done.  I will meet with the patient after the pharmacological stress test to review the results and provide further recommendations.Scribed for [name], MD by Gearldine Shown, medical scribe October 06, 2022  The documentation recorded by the scribe accurately reflects the services I personally performed and the decisions made by me. I reviewed and confirmed all material entered and/or pre-charted by the scribe. Signed: Charleston Ropes, MD.  For questions [phone]/8/20247:15 PM

## 2022-10-14 ENCOUNTER — Emergency Department: Admit: 2022-10-14 | Payer: PRIVATE HEALTH INSURANCE | Primary: Internal Medicine

## 2022-10-14 ENCOUNTER — Inpatient Hospital Stay: Admit: 2022-10-14 | Discharge: 2022-10-14 | Payer: PRIVATE HEALTH INSURANCE

## 2022-10-14 DIAGNOSIS — M79661 Pain in right lower leg: Secondary | ICD-10-CM

## 2022-10-14 DIAGNOSIS — R2681 Unsteadiness on feet: Secondary | ICD-10-CM

## 2022-10-14 MED ORDER — ACETAMINOPHEN 325 MG TABLET
325 mg | Freq: Once | ORAL | Status: CP
Start: 2022-10-14 — End: ?
  Administered 2022-10-15: 01:00:00 325 mg via ORAL

## 2022-10-15 DIAGNOSIS — Z888 Allergy status to other drugs, medicaments and biological substances status: Secondary | ICD-10-CM

## 2022-10-15 DIAGNOSIS — S8011XA Contusion of right lower leg, initial encounter: Secondary | ICD-10-CM

## 2022-10-15 DIAGNOSIS — Z7902 Long term (current) use of antithrombotics/antiplatelets: Secondary | ICD-10-CM

## 2022-10-15 NOTE — ED Provider Notes
Chief Complaint Patient presents with  Fall greater than 89 years old   Unwitnessed fall on thinners around 2 hours ago. Pt reports hit the step wrong. Pt reports falling and striking right calf. No head strike, no loc. Able to get up with assistance. Reports pain with ambulation after fall. Family reports unsteady gait at baseline.  HPI/PE:89 year old male on Plavix comes to emergency room for evaluation of right lower leg pain after falling while into his house today.  Patient states he tripped and fell, denies syncope.  Patient's daughter at the bedside was just in front of him as he fell though she did not see him fall.  Patient denies a head strike.  He complains only of right lateral lower leg pain, states he struck it as he fell.  He has been having pain with ambulation since.  No pain at rest.  Denies any headache or neck pain.  Denies any chest pain or abdominal pain.  Denies passing out.  He denies any chest pain or shortness of breath.Exam: He is well-appearing.  Head is atraumatic, there is no cervical, thoracic, or lumbar spinous process tenderness.  Chest and abdomen are nontender.  Pelvis is stable, no tenderness to the right or left hip.  Normal range of motion about the hip.  Normal range of motion about the right knee.  There is mild tenderness to the right mid lateral lower leg/fibula.  No deformity.  No ankle tenderness.DGL:[id number] diagnosis:  Fibula fracture, lower leg contusion, rule out intracranial hemorrhage or cervical injuryPlan:  X-ray knee/tib-fib, [place] head/cervical spine.Imaging negative for acute findings.Patient is able to ambulate with a limping gait, pain improves as he ambulates more.  He is safe for discharge home with family member.  ED return precautions were discussed.Shayne Alken, PADr Moylan was available for consultation.  Physical ExamED Triage VitalsBP: (!) 123/59 [10/14/22 1811]Pulse: 75 [10/14/22 1811]Pulse from  O2 sat: n/aResp: 17 [10/14/22 1811]Temp: 98.5 ?F (36.9 ?C) [10/14/22 1811]Temp src: Oral [10/14/22 2043]SpO2: 97 % [10/14/22 1811] BP (!) 165/68  - Pulse 69  - Temp 97.9 ?F (36.6 ?C) (Oral)  - Resp 16  - Ht 5' 11 (1.803 m)  - Wt 96.2 kg  - SpO2 99%  - BMI 29.57 kg/m? Physical Exam ProceduresAttestation/Critical CareClinical Impressions as of 10/14/22 2105 Contusion of right lower leg, initial encounter  ED DispositionDischarge Shayne Alken, PA08/16/24 2105

## 2022-10-15 NOTE — Discharge Instructions
Return to the ED if pain increases, if you have leg swelling, or if you are unable to ambulate.

## 2022-10-15 NOTE — ED Notes
Pt remained stable. Able to ambulate w cane and minimal assistance. Pt also has a rolling walker and wheelchair at home if need be. Imaging neg. Pt and daughter given d/c instructions. Verbalized understanding. Pt will f/u with pmd.

## 2022-10-19 ENCOUNTER — Inpatient Hospital Stay: Admit: 2022-10-19 | Discharge: 2022-10-19 | Payer: PRIVATE HEALTH INSURANCE | Primary: Internal Medicine

## 2022-10-19 DIAGNOSIS — I10 Essential (primary) hypertension: Secondary | ICD-10-CM

## 2022-10-19 DIAGNOSIS — E785 Hyperlipidemia, unspecified: Secondary | ICD-10-CM

## 2022-10-19 DIAGNOSIS — I2510 Atherosclerotic heart disease of native coronary artery without angina pectoris: Secondary | ICD-10-CM

## 2022-10-19 MED ORDER — REGADENOSON 0.4 MG/5 ML INTRAVENOUS SYRINGE
0.4 | Status: CP
Start: 2022-10-19 — End: ?

## 2022-10-19 MED ORDER — TECHNETIUM TC-99M TETROFOSMIN INJECTION
Freq: Once | INTRAVENOUS | Status: CP
Start: 2022-10-19 — End: ?
  Administered 2022-10-19: 17:00:00 via INTRAVENOUS

## 2022-10-19 MED ORDER — SODIUM CHLORIDE 0.9 % (FLUSH) INJECTION SYRINGE
0.9 | Freq: Once | INTRAVENOUS | Status: CP
Start: 2022-10-19 — End: ?
  Administered 2022-10-19: 19:00:00 0.9 mL via INTRAVENOUS

## 2022-10-19 MED ORDER — TECHNETIUM TC-99M TETROFOSMIN INJECTION
Freq: Once | INTRAVENOUS | Status: CP
Start: 2022-10-19 — End: ?
  Administered 2022-10-19: 19:00:00 via INTRAVENOUS

## 2022-10-19 MED ORDER — SODIUM CHLORIDE 0.9 % INJECTION SOLUTION
Freq: Once | INTRAVENOUS | Status: CP
Start: 2022-10-19 — End: ?
  Administered 2022-10-19: 19:00:00 via INTRAVENOUS

## 2022-10-19 MED ORDER — SODIUM CHLORIDE 0.9 % INJECTION SOLUTION
Freq: Once | INTRAVENOUS | Status: CP
Start: 2022-10-19 — End: ?
  Administered 2022-10-19: 17:00:00 via INTRAVENOUS

## 2022-10-19 MED ORDER — AMINOPHYLLINE 250 MG/10 ML INTRAVENOUS SOLUTION
250 | Freq: Once | INTRAVENOUS | Status: CP | PRN
Start: 2022-10-19 — End: ?
  Administered 2022-10-19: 19:00:00 250 mL via INTRAVENOUS

## 2022-10-19 MED ORDER — REGADENOSON 0.4 MG/5 ML INTRAVENOUS SYRINGE
0.4 | Freq: Once | INTRAVENOUS | Status: CP
Start: 2022-10-19 — End: ?
  Administered 2022-10-19: 19:00:00 0.4 mL via INTRAVENOUS

## 2022-10-27 ENCOUNTER — Ambulatory Visit: Admit: 2022-10-27 | Payer: PRIVATE HEALTH INSURANCE | Attending: Cardiovascular Disease | Primary: Internal Medicine

## 2022-11-11 ENCOUNTER — Encounter: Admit: 2022-11-11 | Payer: PRIVATE HEALTH INSURANCE | Primary: Internal Medicine

## 2022-11-17 ENCOUNTER — Ambulatory Visit: Admit: 2022-11-17 | Payer: PRIVATE HEALTH INSURANCE | Attending: Cardiovascular Disease | Primary: Internal Medicine

## 2022-11-17 ENCOUNTER — Encounter: Admit: 2022-11-17 | Payer: PRIVATE HEALTH INSURANCE | Attending: Cardiovascular Disease | Primary: Internal Medicine

## 2022-11-17 DIAGNOSIS — E782 Mixed hyperlipidemia: Secondary | ICD-10-CM

## 2022-11-17 DIAGNOSIS — I10 Essential (primary) hypertension: Secondary | ICD-10-CM

## 2022-11-17 DIAGNOSIS — E785 Hyperlipidemia, unspecified: Secondary | ICD-10-CM

## 2022-11-17 DIAGNOSIS — I7143 Infrarenal abdominal aortic aneurysm, without rupture: Secondary | ICD-10-CM

## 2022-11-17 DIAGNOSIS — I2510 Atherosclerotic heart disease of native coronary artery without angina pectoris: Secondary | ICD-10-CM

## 2022-11-17 NOTE — Progress Notes
 [facility] Cardiology Office VisitInterval History: This is a 89 y.o. male with a history of known coronary artery disease status post remote history of an IMI by patient's history, cardiac stenting, longstanding, well controlled hypertension, s/p left carotid endarterectomy, with a history of a known abdominal aortic aneurysm  who returns for follow up. He was last seen on 10/06/22.He was hospitalized from 09/14/22-09/15/22, after presenting to the ED with urinary retention and abdominal pain. He had a foley catheter inserted with some relief. He was found to have a UTI. A [place] scan was performed that shows an increase in size of his known AAA to 5.2 cm x 4.5 cm. . He was treated with ceftriazone and bactrim, with urology follow up for voiding trial. The patient subsequently sought a 2nd opinion with vascular surgery at [facility] with a Dr. Dellis Anes.  He personally review the [place] images and thought that the aneurysm was no greater than 4.5 cm.  He did not recommend any intervention.Today, the patient reports feeling well. He reports experiencing shortness of breath. His daughter reports he has shaky legs, and she had to catch him a couple of times last week as he was imbalanced. He reports one of his doctors told him to double his Lasix dose.Clinically, he has been feeling well from a cardiovascular standpoint. He denies experiencing any chest pain or palpitations. He has not needed to take any SLNTG. No lightheadedness, orthopnea or PND. He denies experiencing any peripheral edema or claudication. No syncope, falls, or loss of consciousness. He is compliant with his medications. He has been monitoring his salt intake.  Social History: He lives with his daughter.  his wife passed away several months ago.His mother had CAD. He has 3 daughters. 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 He drinks 2 cups coffee daily, no alcohol. Review of Allergies/Medical History/Medications: Past Medical History: Diagnosis Date  Chronic coronary artery disease   Hyperlipidemia   Hypertension  Past Surgical History: Procedure Laterality Date  APPENDECTOMY    BLADDER SURGERY  03/01/2019  CHOLECYSTECTOMY    FRACTURE SURGERY    rifgt hand Outpatient Medications Marked as Taking for the 11/17/22 encounter (Follow Up) with [name]., MD Medication Sig Dispense Refill  acetaminophen (TYLENOL) 325 mg tablet Take 2 tablets (650 mg total) by mouth every 6 (six) hours as needed for pain. 30 tablet 0  aspirin 81 mg EC delayed release tablet Take 1 tablet (81 mg total) by mouth daily.    atorvastatin (LIPITOR) 80 mg tablet Take 1 tablet (80 mg total) by mouth daily.    b complex vitamins capsule Take 1 capsule by mouth daily.    bismuth subsalicylate (PEPTO BISMOL) 262 mg chewable tablet Take 1 tablet (262 mg total) by mouth 4 (four) times daily as needed.    carvediloL (COREG) 12.5 mg Immediate Release tablet TAKE 1 TABLET BY MOUTH TWICE  DAILY WITH BREAKFAST AND DINNER 180 tablet 3  clopidogreL (PLAVIX) 75 mg tablet Take 1 tablet (75 mg total) by mouth daily.    escitalopram oxalate (LEXAPRO) 5 mg tablet Take 1 tablet (5 mg total) by mouth daily.    furosemide (LASIX) 20 mg tablet Take 1 tablet (20 mg total) by mouth 2 (two) times daily.    gabapentin (NEURONTIN) 100 mg capsule Take 2 capsules (200 mg total) by mouth nightly.    ipratropium (ATROVENT) 21 mcg (0.03 %) nasal spray USE 2 SPRAYS IN BOTH  NOSTRILS TWICE DAILY  isosorbide mononitrate (IMDUR) 30 mg 24 hr extended release tablet Take 1 tablet (30 mg total) by mouth daily.    ketoconazole (NIZORAL) 2 % cream Apply topically 2 (two) times daily. To affected area on buttock 30 g 2  losartan (COZAAR) 25 mg tablet Take 1 tablet (25 mg total) by mouth daily.    multivitamin tablet Take 1 tablet by mouth daily. nitroGLYCERIN (NITROSTAT) 0.4 mg SL tablet Place 1 tablet (0.4 mg total) under the tongue every 5 (five) minutes as needed for chest pain. If no relief, call 911. May repeat every 5 minutes for a total of 3 tablets. 25 tablet 3  omeprazole (PRILOSEC) 20 mg capsule Take 1 capsule (20 mg total) by mouth daily.    senna (SENOKOT) 8.6 mg tablet Take 1 tablet (8.6 mg total) by mouth nightly as needed for constipation. 30 tablet 11  tamsulosin (FLOMAX) 0.4 mg 24 hr capsule Take 1 capsule (0.4 mg total) by mouth 2 (two) times daily.   Allergies Allergen Reactions  Ace Inhibitors Cough Review of Systems: Review of Systems Constitutional: Negative.  HENT: Negative.   Eyes: Negative.  Respiratory: Negative.   Cardiovascular: Negative.  Gastrointestinal: Negative.  Genitourinary: Negative.  Musculoskeletal: Negative.  Skin: Negative.  Neurological: Negative.  Endo/Heme/Allergies: Negative.  Psychiatric/Behavioral: Negative.   All other systems reviewed and are negative.Physical Exam: Vitals:  11/17/22 1211 BP: 136/64 Site: Left Arm Position: Sitting Cuff Size: Medium Pulse: 80 Weight: 96.6 kg Height: 5' 11 (1.803 m) Manual [id number] ExamConstitutional:     General: He is not in acute distress.   Appearance: He is well-developed. Neck:    Vascular: Normal carotid pulses. No carotid bruit, hepatojugular reflux or JVD. Cardiovascular:    Rate and Rhythm: Normal rate and regular rhythm.    Heart sounds: Murmur (soft of aortic stenosis) heard.    No friction rub.    Comments: Crisp S2Pulmonary:    Effort: Pulmonary effort is normal.    Breath sounds: Normal breath sounds. No wheezing or rales. Abdominal:    General: Bowel sounds are normal.    Palpations: Abdomen is soft.    Tenderness: There is no abdominal tenderness. There is no rebound. Musculoskeletal:       General: Normal range of motion. Cervical back: Normal range of motion and neck supple.    Right lower leg: No edema.    Left lower leg: No edema. Skin:   General: Skin is warm and dry. Neurological:    Mental Status: He is alert and oriented to person, place, and time. Psychiatric:       Behavior: Behavior normal. Review of Labs/Diagnostics: ECG/Tele Events: Independently interpreted by me:  Not repeatedEcho Findings:  From September 29, 2022:  here is mild concentric hypertrophy. Left ventricular systolic function is normal with an estimated ejection fraction of 65-70%. There is abnormal relaxation.   The right ventricle is normal in size. Right ventricular systolic function is normal.   The mitral leaflets are thickened. There is moderate mitral annular calcification. There is trace mitral regurgitation. There is mild calcific mitral stenosis. The MVA is 2.0cm2 and the mean PG is (HR 76bpm).   There is mild to moderate aortic regurgitation. There is mild to moderate aortic stenosis. The calculated valve area is 1.4 cm2, with a peak velocity of 2.4 m/s, a mean gradient of 13.0 mmHg, and a dimensionless index of  0.46.   The aortic root dimension is normal. The ascending aorta is borderline dilated at 3.7 cm (1.7 cm/m2  BSA).   The estimated right ventricular systolic pressure is normal at 21 mmHg.   Compared to prior outside report from 01/01/2021 AS and MS are now evident. Results for orders placed or performed during the hospital encounter of 01/01/21 Echo 2D Complete w Doppler and CFI if Ind Image Enhancement 3D and or bubbles Result Value Ref Range  Reported Biplane EF% 62 %  Narrative   * Normal left ventricular size and systolic function. Mild concentric left ventricular hypertrophy.  LVEF calculated by biplane Simpson's was 62%.  Abnormal tissue Doppler suggestive of abnormal diastolic function.* Normal right ventricular cavity size and systolic function. Estimated right ventricular systolic pressure is 27 mmHg.* Atria are normal in size* Aortic valve is trileaflet.  Mild aortic valve calcification.  Aortic sclerosis without stenosis.  Peak aortic velocity is 1.6 m/s.  Mean gradient is 6 mmHg.  Dimensionless index is 0.70.  Trace aortic regurgitation.* Mitral valve appears calcified.  Mild anterior and moderate posterior mitral annular calcification.  No mitral regurgitation.  No mitral stenosis.* IVC diameter < 2.1 cm that collapses > 50% with a sniff suggests normal RAP (0-5 mmHg, mean 3 mmHg).* No significant pericardial effusion* No prior study available for comparison Stress Test: Results for orders placed or performed during the hospital encounter of 10/19/22 NM Myocardial Perfusion SPECT (Stress and Rest) with Regadenoson  Narrative   * 1) Abnormal SPECT myocardial perfusion imaging following pharmacologic vasodilation with regadenoson and at rest.* 2) Perfusion imaging was abnormal showing a small sized, moderate intensity, fixed perfusion defect in the basal to mid inferior wall consistent with scar.* 3) Global left ventricular systolic function was normal with abnormal regional wall motion showing basal inferior hypokinesis.  The left ventricle was normal in size.* 4) Stress electrocardiogram was normal.  Peak stress blood pressure was normal at 126/62 mmHg.* 5) No prior study available for comparison. Lab Review: Lab Results Component Value Date  CREATININE 1.81 (H) 09/15/2022  BUN 28 (H) 09/15/2022  NA 144 09/15/2022  K 3.9 09/15/2022  CL 109 (H) 09/15/2022  CO2 23 09/15/2022 Lab Results Component Value Date  CHOL 106 07/31/2019  LDL 51 07/31/2019  TRIG 67 07/31/2019  HDL 41 07/31/2019  Lab Results Component Value Date  HGBA1C 5.6 07/24/2019 Lipid Panel Reflex Direct LDLSpecimen: Blood - Blood specimen (specimen)ComponentRef Range & Units 10 mo ago Comments Cholesterol, Total<200 mg/dL 98  Cholesterol, HDL> OR = 40 mg/dL 35 Low   [id number]<182 mg/dL 73  LDL Cholesterolmg/dL (calc) 48  Recent Studies:  [place] FLANK PAIN 07/17/24History/Indication: Abdominal/flank pain, stone suspectedR sided abdominal, flank tenderness Technique: [place] images were obtained from the superior aspect of the kidneys to the pubic symphysis without administration of intravenous contrast utilizing a flank pain protocol. Coronal and sagittal multiplanar reformatted images were provided. Comparison: CTA ABDOMEN PELVIS W AND/OR WO IV CONTRAST 2020-07-16  FINDINGS:  KIDNEYS & URETERS: Left greater than right mild-to-moderate hydroureteronephrosis with dilation of the ureters visualized to the level of the urinary bladder. No stones or focal obstructing lesions are visualized. Simple renal cysts are again seen within the left kidney. No perinephric fluid collections are visualized.URINARY BLADDER: A urethral catheter is visualized within the bladder lumen. Diffuse bladder wall thickening is again noted, may be slightly greater posteriorly on the left than the right. The bladder is decompressed limiting evaluation. Intraluminal air is presumably secondary to instrumentation. LUNG BASES: Dependent atelectasis. Examination degraded by motion artifact.  LIVER: Unremarkable.GALLBLADDER: Surgically absent gallbladder.SPLEEN: Unremarkable.PANCREAS: Unremarkable.ADRENALS: Unremarkable. BOWEL: Unremarkable. PERITONEUM: Unremarkable.VESSELS: Interval enlargement  of known infrarenal aortic aneurysm measuring 5.2 x 4.5 cm (previously 4.5 x 4.3 cm).PELVIS: Prostatomegaly with internal prostatic calcifications. BONES & SOFT TISSUE: Redemonstrated multilevel degenerative changes throughout the thoracolumbar spine. Vertebral body heights are grossly preserved. IMPRESSION:  1.         Bilateral mild-to-moderate hydroureteronephrosis without evidence of nephrolithiasis or obstructing focal lesion. Etiology is likely secondary to bladder outflow obstruction in the setting of prostatomegaly.2.         Interval enlargement of known infrarenal aortic aneurysm measuring 5.2 x 4.5 cm (previously 4.5 x 4.3 cm). [place] [facility] Notify System Classification: Routine. Report initiated by:  Reola Calkins, MD Reported and signed by: Virl Axe, MD  [facility] Rad[facility] and Biomedical Imaging----------------------------------------------------------------XR Chest Obliques        07/12/24Order: [id number] ImpressionNo acute cardiopulmonary findings.NarrativeHistory: shortness of breath, pedal edema, eval for CHF.Technique: PA and lateral images of the chest.Comparison: Chest exam dated 05/14/2021.Findings: Stable hyperinflated lungs. No lobar consolidation.  No pneumothorax, effusion, or free air. Normal heart size. Atherosclerotic ectatic stable appearing aorta. No pulmonary vascular congestion.  Stable multilevel degenerative changes of thethoracic spine.Exam End: 09/09/22  2:39 PM  Specimen Collected: 09/09/22  2:51 PM Last Resulted: 09/09/22  2:51 PM Received From: [facility]  Result Received: 09/09/22  3:13 PM Encounter Diagnosis:    ICD-10-CM  1. Coronary artery disease involving native coronary artery of native heart without angina pectoris  I25.10   2. Essential hypertension  I10   3. Mixed hyperlipidemia  E78.2   4. Infrarenal abdominal aortic aneurysm (AAA) without rupture (HC Code)  I71.43   Impression and Plan: 89 y.o.  male with known coronary artery disease status post remote history of an IMI by patient's history, longstanding, well controlled hypertension, status post a left carotid endarterectomy, with a history of a known abdominal aortic aneurysm returns for follow-up.  In July of 2024, he was hospitalized with urinary retention/UTI.  He underwent a [place] scan that showed an increase in the size of his infrarenal abdominal aortic aneurysm.  He was seen by vascular surgery at [facility] who recommended consideration for elective repair.  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  The patient sought a 2nd opinion from vascular surgery at [facility].  Dr. Dellis Anes personally reviewed the [place] scan and did not feel there was any increase in size of the infrarenal aneurysm.  Clinically, the patient denies exertional chest pain.  He does get short of breath for unclear reasons.  He was advised by his urologist increase his tamsulosin to 0.4 mg b.i.d..  He also increased his Lasix to 20 mg b.i.d. for unclear reasons.  Today on exam, the patient's blood pressure is well controlled, he is in sinus rhythm.  There is no CHF on exam.  He has a murmur of aortic stenosis.  For now, I have reassured the patient that there is no indication to intervene on his abdominal aortic aneurysm.  His cardiac risk factors are well-controlled.  He does complain of frequent urination and I advised the patient to try reducing his Lasix to 20 mg daily with the understanding that if he develops edema or shortness of breath, he should resume the prior dose.  He has regular follow-up with his other physicians.  I have asked him to return in 6 months Scribed for [name], MD by Gearldine Shown, medical scribe November 17, 2022  The documentation recorded by the scribe accurately reflects the services I  personally performed and the decisions made by me. I reviewed and confirmed all material entered and/or pre-charted by the scribe. Signed: Charleston Ropes, MD.  For questions [phone]/19/[id number]:15 PM

## 2022-12-18 ENCOUNTER — Inpatient Hospital Stay
Admit: 2022-12-18 | Discharge: 2022-12-22 | Payer: PRIVATE HEALTH INSURANCE | Attending: Hospitalist | Admitting: Surgical

## 2022-12-18 ENCOUNTER — Emergency Department: Admit: 2022-12-18 | Payer: PRIVATE HEALTH INSURANCE | Primary: Internal Medicine

## 2022-12-18 LAB — CBC WITH AUTO DIFFERENTIAL
BKR WAM ABSOLUTE IMMATURE GRANULOCYTES.: 0.02 x 1000/ÂµL (ref 0.00–0.30)
BKR WAM ABSOLUTE LYMPHOCYTE COUNT.: 0.94 x 1000/ÂµL (ref 0.60–3.70)
BKR WAM ABSOLUTE NRBC (2 DEC): 0 x 1000/ÂµL (ref 0.00–1.00)
BKR WAM ANC (ABSOLUTE NEUTROPHIL COUNT): 4.48 x 1000/ÂµL (ref 2.00–7.60)
BKR WAM BASOPHIL ABSOLUTE COUNT.: 0.02 x 1000/ÂµL (ref 0.00–1.00)
BKR WAM BASOPHILS: 0.3 % (ref 0.0–1.4)
BKR WAM EOSINOPHIL ABSOLUTE COUNT.: 0.4 x 1000/ÂµL (ref 0.00–1.00)
BKR WAM EOSINOPHILS: 6 % — ABNORMAL HIGH (ref 0.0–5.0)
BKR WAM HEMATOCRIT (2 DEC): 22.7 % — ABNORMAL LOW (ref 38.50–50.00)
BKR WAM HEMOGLOBIN: 7.3 g/dL — ABNORMAL LOW (ref 13.2–17.1)
BKR WAM IMMATURE GRANULOCYTES: 0.3 % (ref 0.0–1.0)
BKR WAM LYMPHOCYTES: 14.1 % — ABNORMAL LOW (ref 17.0–50.0)
BKR WAM MCH (PG): 23.6 pg — ABNORMAL LOW (ref 27.0–33.0)
BKR WAM MCHC: 32.2 g/dL (ref 31.0–36.0)
BKR WAM MCV: 73.5 fL — ABNORMAL LOW (ref 80.0–100.0)
BKR WAM MONOCYTE ABSOLUTE COUNT.: 0.82 x 1000/ÂµL (ref 0.00–1.00)
BKR WAM MONOCYTES: 12.3 % — ABNORMAL HIGH (ref 4.0–12.0)
BKR WAM MPV: 11.1 fL (ref 8.0–12.0)
BKR WAM NEUTROPHILS: 67 % (ref 39.0–72.0)
BKR WAM NUCLEATED RED BLOOD CELLS: 0 % (ref 0.0–1.0)
BKR WAM PLATELETS: 186 x1000/ÂµL (ref 150–420)
BKR WAM RDW-CV: 15.7 % — ABNORMAL HIGH (ref 11.0–15.0)
BKR WAM RED BLOOD CELL COUNT.: 3.09 M/ÂµL — ABNORMAL LOW (ref 4.00–6.00)
BKR WAM WHITE BLOOD CELL COUNT: 6.7 x1000/ÂµL (ref 4.0–11.0)

## 2022-12-18 LAB — BASIC METABOLIC PANEL
BKR ANION GAP: 14 (ref 7–17)
BKR ANION GAP: 14 (ref 7–17)
BKR BLOOD UREA NITROGEN: 111 mg/dL — ABNORMAL HIGH (ref 8–23)
BKR BLOOD UREA NITROGEN: 106 mg/dL — ABNORMAL HIGH (ref 8–23)
BKR BUN / CREAT RATIO: 16 (ref 8.0–23.0)
BKR BUN / CREAT RATIO: 16.3 (ref 8.0–23.0)
BKR CALCIUM: 8.6 mg/dL — ABNORMAL LOW (ref 8.8–10.2)
BKR CALCIUM: 8.5 mg/dL — ABNORMAL LOW (ref 8.8–10.2)
BKR CHLORIDE: 110 mmol/L — ABNORMAL HIGH (ref 98–107)
BKR CHLORIDE: 111 mmol/L — ABNORMAL HIGH (ref 98–107)
BKR CO2: 16 mmol/L — ABNORMAL LOW (ref 20–30)
BKR CO2: 14 mmol/L — ABNORMAL LOW (ref 20–30)
BKR CREATININE: 6.92 mg/dL — ABNORMAL HIGH (ref 0.40–1.30)
BKR CREATININE: 6.51 mg/dL — ABNORMAL HIGH (ref 0.40–1.30)
BKR EGFR, CREATININE (CKD-EPI 2021): 7 mL/min/{1.73_m2} — ABNORMAL LOW (ref >=60)
BKR EGFR, CREATININE (CKD-EPI 2021): 7 mL/min/{1.73_m2} — ABNORMAL LOW (ref >=60)
BKR GLUCOSE: 123 mg/dL — ABNORMAL HIGH (ref 70–100)
BKR GLUCOSE: 134 mg/dL — ABNORMAL HIGH (ref 70–100)
BKR POTASSIUM: 5.7 mmol/L — ABNORMAL HIGH (ref 3.3–5.3)
BKR POTASSIUM: 5.3 mmol/L (ref 3.3–5.3)
BKR SODIUM: 140 mmol/L (ref 136–144)
BKR SODIUM: 139 mmol/L (ref 136–144)

## 2022-12-18 LAB — TROPONIN T HIGH SENSITIVITY, 1 HOUR WITH REFLEX (BH GH LMW YH)
BKR TROPONIN T HS 1 HOUR DELTA FROM 0 HOUR: 3 ng/L
BKR TROPONIN T HS 1 HOUR: 82 ng/L — ABNORMAL HIGH

## 2022-12-18 LAB — IRON AND TIBC: BKR IRON: 50 ug/dL — ABNORMAL LOW (ref 59–158)

## 2022-12-18 LAB — TROPONIN T HIGH SENSITIVITY, 3 HOUR (BH GH LMW YH)
BKR TROPONIN T HS 1 HOUR DELTA FROM 0 HOUR ON 3HR: 3 ng/L
BKR TROPONIN T HS 3 HOUR DELTA FROM 0 HOUR: 6 ng/L
BKR TROPONIN T HS 3 HOUR: 85 ng/L — ABNORMAL HIGH

## 2022-12-18 LAB — URINALYSIS WITH CULTURE REFLEX      (BH LMW YH)
BKR BILIRUBIN, UA: NEGATIVE
BKR GLUCOSE, UA: NEGATIVE
BKR KETONES, UA: NEGATIVE
BKR NITRITE, UA: NEGATIVE
BKR PH, UA: 6 (ref 5.5–7.5)
BKR SPECIFIC GRAVITY, UA: 1.01 (ref 1.005–1.030)
BKR UROBILINOGEN, UA (MG/DL): <2 mg/dL (ref <=2.0)

## 2022-12-18 LAB — MAGNESIUM: BKR MAGNESIUM: 1.6 mg/dL — ABNORMAL LOW (ref 1.7–2.4)

## 2022-12-18 LAB — UA REFLEX CULTURE

## 2022-12-18 LAB — RETICULOCYTES
BKR WAM IRF: 10 % (ref 3.0–15.9)
BKR WAM RETICULOCYTE - ABS (3 DEC): 0.034 10Ë6 cells/uL (ref 0.023–0.140)
BKR WAM RETICULOCYTE COUNT PCT (1 DEC): 1.1 % (ref 0.6–2.7)
BKR WAM RETICULOCYTE HGB EQUIVALENT: 23.6 pg — ABNORMAL LOW (ref 28.2–35.7)

## 2022-12-18 LAB — VITAMIN B12: BKR VITAMIN B12: 1548 pg/mL — ABNORMAL HIGH (ref 232–1245)

## 2022-12-18 LAB — TSH W/REFLEX TO FT4     (BH GH LMW Q YH): BKR THYROID STIMULATING HORMONE: 1.12 u[IU]/mL

## 2022-12-18 LAB — URINE MICROSCOPIC     (BH GH LMW YH)

## 2022-12-18 LAB — FERRITIN: BKR FERRITIN: 476 ng/mL — ABNORMAL HIGH (ref 30–400)

## 2022-12-18 LAB — TROPONIN T HIGH SENSITIVITY, 0 HOUR BASELINE WITH REFLEX (BH GH LMW YH): BKR TROPONIN T HS 0 HOUR BASELINE: 79 ng/L — ABNORMAL HIGH

## 2022-12-18 MED ORDER — DEXTROSE 10 % IV BOLUS FOR ORDERABLE
INTRAVENOUS | Status: AC | PRN
Start: 2022-12-18 — End: ?

## 2022-12-18 MED ORDER — POLYETHYLENE GLYCOL 3350 17 GRAM ORAL POWDER PACKET
17 gram | Freq: Two times a day (BID) | ORAL | Status: DC | PRN
Start: 2022-12-18 — End: 2022-12-22

## 2022-12-18 MED ORDER — ESCITALOPRAM 5 MG TABLET
5 mg | Freq: Every day | ORAL | Status: DC
Start: 2022-12-18 — End: 2022-12-22
  Administered 2022-12-18 – 2022-12-22 (×5): 5 mg via ORAL

## 2022-12-18 MED ORDER — ONDANSETRON 4 MG DISINTEGRATING TABLET
4 mg | Freq: Three times a day (TID) | ORAL | Status: DC | PRN
Start: 2022-12-18 — End: 2022-12-22

## 2022-12-18 MED ORDER — EMOLLIENT COMBINATION NO.92 TOPICAL LOTION
Freq: Every day | TOPICAL | Status: DC | PRN
Start: 2022-12-18 — End: 2022-12-22
  Administered 2022-12-22: 01:00:00 177.000 mL via TOPICAL

## 2022-12-18 MED ORDER — INSULIN U-100 REGULAR HUMAN 100 UNIT/ML INJECTION SOLUTION
100 | Freq: Once | INTRAVENOUS | Status: CP
Start: 2022-12-18 — End: ?
  Administered 2022-12-18: 12:00:00 100 unit/mL via INTRAVENOUS

## 2022-12-18 MED ORDER — SENNOSIDES 8.6 MG TABLET
8.6 mg | Freq: Every evening | ORAL | Status: DC | PRN
Start: 2022-12-18 — End: 2022-12-22

## 2022-12-18 MED ORDER — CEFTRIAXONE IV PUSH 1000 MG VIAL & NS (ADULTS)
Freq: Once | INTRAVENOUS | Status: CP
Start: 2022-12-18 — End: ?
  Administered 2022-12-18: 12:00:00 10.000 mL via INTRAVENOUS

## 2022-12-18 MED ORDER — CALCIUM GLUCONATE INJECTION
Freq: Once | INTRAVENOUS | Status: CP
Start: 2022-12-18 — End: ?
  Administered 2022-12-18: 11:00:00 20.000 mL via INTRAVENOUS

## 2022-12-18 MED ORDER — ACETAMINOPHEN 325 MG TABLET
325 mg | Freq: Three times a day (TID) | ORAL | Status: DC | PRN
Start: 2022-12-18 — End: 2022-12-22
  Administered 2022-12-19 – 2022-12-21 (×3): 325 mg via ORAL

## 2022-12-18 MED ORDER — ISOSORBIDE MONONITRATE ER 30 MG TABLET,EXTENDED RELEASE 24 HR
30 mg | Freq: Every day | ORAL | Status: DC
Start: 2022-12-18 — End: 2022-12-22
  Administered 2022-12-19 – 2022-12-22 (×4): 30 mg via ORAL

## 2022-12-18 MED ORDER — CARVEDILOL IMMEDIATE RELEASE 12.5 MG TABLET
12.5 mg | Freq: Two times a day (BID) | ORAL | Status: DC
Start: 2022-12-18 — End: 2022-12-22
  Administered 2022-12-18 – 2022-12-22 (×9): 12.5 mg via ORAL

## 2022-12-18 MED ORDER — LOSARTAN 25 MG TABLET
25 | Freq: Every day | ORAL | Status: DC
Start: 2022-12-18 — End: 2022-12-18
  Administered 2022-12-18: 16:00:00 25 mg via ORAL

## 2022-12-18 MED ORDER — MAGNESIUM HYDROXIDE 400 MG/5 ML ORAL SUSPENSION
4005 mg/5 mL | Freq: Every day | ORAL | Status: DC | PRN
Start: 2022-12-18 — End: 2022-12-22

## 2022-12-18 MED ORDER — DEXTROSE 10 % IV BOLUS FOR ORDERABLE
Freq: Once | INTRAVENOUS | Status: CP
Start: 2022-12-18 — End: ?
  Administered 2022-12-18: 12:00:00 via INTRAVENOUS

## 2022-12-18 MED ORDER — PHENOL 1.4 % MUCOSAL AEROSOL SPRAY
1.4 % | OROMUCOSAL | Status: DC | PRN
Start: 2022-12-18 — End: 2022-12-22

## 2022-12-18 MED ORDER — TAMSULOSIN 0.4 MG CAPSULE
0.4 mg | Freq: Two times a day (BID) | ORAL | Status: DC
Start: 2022-12-18 — End: 2022-12-22
  Administered 2022-12-19 – 2022-12-22 (×8): 0.4 mg via ORAL

## 2022-12-18 MED ORDER — ASPIRIN 81 MG TABLET,DELAYED RELEASE
81 | Freq: Every day | ORAL | Status: DC
Start: 2022-12-18 — End: 2022-12-18
  Administered 2022-12-18: 16:00:00 81 mg via ORAL

## 2022-12-18 MED ORDER — ACETAMINOPHEN 325 MG TABLET
325 | Freq: Four times a day (QID) | ORAL | Status: DC | PRN
Start: 2022-12-18 — End: 2022-12-18

## 2022-12-18 MED ORDER — CLOPIDOGREL 75 MG TABLET
75 mg | Freq: Every day | ORAL | Status: DC
Start: 2022-12-18 — End: 2022-12-22
  Administered 2022-12-18 – 2022-12-22 (×5): 75 mg via ORAL

## 2022-12-18 MED ORDER — IPRATROPIUM 0.5 MG-ALBUTEROL 3 MG (2.5 MG BASE)/3 ML NEBULIZATION SOLN
0.5-32.53 mg-3 mg(2.5 mg base)/3 mL | Freq: Four times a day (QID) | RESPIRATORY_TRACT | Status: DC | PRN
Start: 2022-12-18 — End: 2022-12-22

## 2022-12-18 MED ORDER — SODIUM CHLORIDE 0.9 % (FLUSH) INJECTION SYRINGE
0.9 | Freq: Once | INTRAVENOUS | Status: CP
Start: 2022-12-18 — End: ?
  Administered 2022-12-18: 12:00:00 0.9 mL via INTRAVENOUS

## 2022-12-18 MED ORDER — MAGNESIUM SULFATE 2 GRAM/50 ML (4 %) IN WATER INTRAVENOUS PIGGYBACK
2 | Freq: Once | INTRAVENOUS | Status: CP
Start: 2022-12-18 — End: ?
  Administered 2022-12-18: 11:00:00 2 mL/h via INTRAVENOUS

## 2022-12-18 MED ORDER — ONDANSETRON HCL (PF) 4 MG/2 ML INJECTION SOLUTION
42 mg/2 mL | Freq: Four times a day (QID) | INTRAVENOUS | Status: DC | PRN
Start: 2022-12-18 — End: 2022-12-22

## 2022-12-18 MED ORDER — ROSUVASTATIN 40 MG TABLET
40 mg | Freq: Every day | ORAL | Status: DC
Start: 2022-12-18 — End: 2022-12-22
  Administered 2022-12-18 – 2022-12-22 (×5): 40 mg via ORAL

## 2022-12-18 MED ORDER — SODIUM CHLORIDE 0.9 % BOLUS (NEW BAG)
0.9 | Freq: Once | INTRAVENOUS | Status: CP
Start: 2022-12-18 — End: ?
  Administered 2022-12-18: 11:00:00 0.9 mL/h via INTRAVENOUS

## 2022-12-18 MED ORDER — MELATONIN 3 MG TABLET
3 mg | Freq: Every evening | ORAL | Status: DC | PRN
Start: 2022-12-18 — End: 2022-12-22
  Administered 2022-12-22: 01:00:00 3 mg via ORAL

## 2022-12-18 MED ORDER — FUROSEMIDE 20 MG TABLET
20 mg | Freq: Two times a day (BID) | ORAL | Status: DC
Start: 2022-12-18 — End: 2022-12-22
  Administered 2022-12-18: 16:00:00 20 mg via ORAL

## 2022-12-18 NOTE — Utilization Review (ED)
 UM Status: Managed Medicare IP S/p fall CTH negative for any acute fracture. CXR with mild Insterstitial edema
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
## 2022-12-18 NOTE — ED Notes
 7:41 PM Report received from off going nurse. Patient care assumed at this time. Pt presents to ED for fall. Found to have UTI. Foley placed. Family at bedside.

## 2022-12-18 NOTE — ED Notes
 10:38 AM Assumed Care. A&Ox4. RA. VS monitored. Tele monitored. Condom catheter replaced. Assist x 1 with RW. BG Monitored. 11:56 AMDiet changed to Regular. Labs collected and sent. AM Medication administered 12:18 PMLabs collected and sent2:37 PMBedpan provided3:24 PMPt had BM, bedpan provided. 5:41 PMDinner provided. Daughter at bedside

## 2022-12-18 NOTE — Care Coordination-Inpatient
 Case Management Screening and Evaluation  Flowsheet Row Most Recent Value Case Management Screening: Chart review completed. If YES to any question below then proceed to CM Eval/Plan  Is there a change in their cognitive function No Do you anticipate that the pt will have any discharge needs requiring CM intervention? Yes Has there been an unscheduled readmission within the last 30 days and/or four (4) encounters (encounters include: ED, OBS, Inpatient) within the last six (6) months? Yes Were there services prior to admission ( Examples: Assisted Living, HD, Homecare, Extended Care Facility, Methadone, SNF, Outpatient Infusion Center) No Negative/Positive Screen Positive Screening: Complete CM Evaluation and Plan Case manager will take lead to arrange post-acute care services and continue to work with the care team as the patient progresses towards discharge Yes Case Manager Attestation  I have reviewed the medical record and completed the above screen. CM staff will follow patient's progress and discuss the plan of care with the Treatment Team. Yes Case Management Evaluation and Plan  Arrived from prior to admission home/apartment/condo Do you have a caregiver, or do you anticipate the need for a caregiver given the change in your physicial function? Yes Permission to Speak to Caregiver? Yes Caregiver name: [name] Day(s) Caregiver available 24/7  Lives With Child(ren), Adult Services Prior to Admission none, other (see comment)  [recent dc from [facility] at home] Patient Requires Care Coordination Intervention Due To discharge planning needs/concerns, change in physical function Prior to Hospitalization: Assistance Needed/DME being used Ambulation, Bathing Ambulation Assistance/DME: manual wheelchair, rollator, straight cane Bathing Assistance/DME: bed/bath same level, shower chair  [modified shower entrance,  step in] Documented Insurance Accurate Yes Any financial concerns related to anticipated discharge needs No Patient's home address verified Yes Patient's PCP of record verified Yes Source of Clinical History  Patient's clinical history has been reviewed and source of Information is: Patient, Child(ren), Medical Provider, Medical record   Patient is from home; da lives in first floor apartment, patient living area on second floor and bed/bath on third floor; daughter explains progressive weakness noticed in the home, difficulty with stairs, is planning on sourcing for chair lift, expressed need for a transport chair and cm provided information on AAoA and medical loan closets in the state, patient does not have an emergency call system as da feels she will hear him if he falls.Patient does not currently have a commode and daughter will assess for need at time of dc. Daughter also notes patient uses rollator in the home and she has a second in the car for travel.Da was recently on the phone asking for additional PT as she saw the quick decrease in her father's physical abilities after recent home ended. Per team Pt is a 89 y.o. male w/ PMH of HTN, HLD who presents with reported mechanical fall at home.  States he was trying to go to the bathroom to urinate and fell from standing hitting his head and neck.  Currently complaining of neck pain. CM will follow patient's progress with team and arrange post acute care services if needed. CM updated previous home care agency on recent admission. Antony Blackbird, BSN, MPH, ACM-RNPer Diem Care Manager Care Management Department [phone] [facility])

## 2022-12-18 NOTE — ED Notes
 6:32 AM Pt 89 y/o M c/o fall. Pt reports falling while attempting to sit on the toilet. Per EMS pt's daughter who lives in same house heard thump. Pt was down for 30 minutes. On thinners. Pt reports cervical neck tenderness. Pt reports new onset urinary incontinence and tremors. Pt states he usually receives physical therapy but hasn't received it in a while. Pt denies fever/chills, NVD. GCS 15. A&Ox4.

## 2022-12-18 NOTE — ED Notes
 7:10 AM Jon Gills, RNChief Complaint Patient presents with  Fall   Pt unwitnessed fall, no head strike, + thinners, no obvious deformity. Pt down for 30 minutes, pt has ambulation difficulty & baseline. Received report from off-going RN. Assumed care of pt at this time. Pt presented for fall. Hx AAA, CAD, HTN. Pt currently at [place] scan. Pt's daughter reports he has fallen x2 in the past 2 days d/t BL LE weakness. Normally walks with cane/walker, but having more difficulty. Pending results. 8:03 AMA&O x4. VSS. + pp, trace BL LE edema. Lungs diminished, slight tachypnea noted. Abd soft, nontender. C/o back and neck pain. C collar in place. IVFB infusing. Incontinent of urine, [place] catheter in place. 20 LAC placed on previous shift. 20 R forearm placed. Daughter at bedside. Pending results. 9:06 AMNSR on monitor. Meds given for hyperkalemia, BG 123. IV magnesium completed. Pending admission for fall, AKI, hypomagnesemia, hyperkalemia, and anemia. Floor Handoff Telemetry: 	[x]  Yes		[]  NoCode Status:   [x]  Full		[]  DNR		[]  DNI		Other (specify):Safety Precautions: [x]  Fall Risk  []  Sitter   []  Restraints	[]  Suicidal	[]  None	Other (specify):Mentation/Orientation:	 A&O (Self, person, place, time) x     4     	 Disoriented to:                    	 Special Accommodations: []  Hearing impaired   []  Blind  []  Nonverbal  []  Cognitive impairmentOxygenation Upon Admission: [x]  RA	[]  NC	[]  Venti  []  Simple Mask []  Other	Baseline O2 Status? [x]  Yes	[]  NoAmbulation: []  Independent	[]  Cane   [x]  Walker	[]  Wheelchair	[]  Bedbound		[]  Hemiplegic	[]  Paraplegic	[]  QuadraplegicEliminiation: []  Independent	[]  Commode	[]  Bedpan/Urinal  []  Straight Cath []  Foley cath			[]  Urostomy	[]  Colostomy	Other (specify): [place] cathDiarrhea/Loose stool : []  1x within 24h  []  2x within 24h  []  3x within 24h  [x]  None 	C.Diff Order: 	[]  Ordered- needs to be collected []  Collected-sent to lab             []  Resulted - Negative C.Diff             []  Resulted - Positive C.Diff[x]  Not Ordered   []  N/ASkin Alteration: []  Pressure Injury []  Wound []  None [x]  Skin not assessedDiet: [x]  Regular/No order placed	[]  NPO		Other (specify):IV Access: [x]  PIV   []  PICC    []  Port    [] Central line    []  A-line    Other (specify)IVF/GTT Running Upon ED Departure? [x]  No	    []  Yes (specify):Outstanding Meds/Treatments/Tests:Patient Belongings:Are the belongings documented?          [x]  No	    []  YesIs someone taking belongings home?   [x]  No     []  Yes  Who? (specify)                                   Jon Gills, RN

## 2022-12-19 ENCOUNTER — Encounter: Admit: 2022-12-19 | Payer: PRIVATE HEALTH INSURANCE | Attending: Surgical | Primary: Internal Medicine

## 2022-12-19 ENCOUNTER — Inpatient Hospital Stay: Admit: 2022-12-19 | Payer: PRIVATE HEALTH INSURANCE

## 2022-12-19 DIAGNOSIS — I2510 Atherosclerotic heart disease of native coronary artery without angina pectoris: Secondary | ICD-10-CM

## 2022-12-19 DIAGNOSIS — E785 Hyperlipidemia, unspecified: Secondary | ICD-10-CM

## 2022-12-19 DIAGNOSIS — I10 Essential (primary) hypertension: Secondary | ICD-10-CM

## 2022-12-19 DIAGNOSIS — N179 Acute kidney failure, unspecified: Secondary | ICD-10-CM

## 2022-12-19 LAB — CBC WITH AUTO DIFFERENTIAL
BKR WAM ABSOLUTE IMMATURE GRANULOCYTES.: 0.03 x 1000/ÂµL (ref 0.00–0.30)
BKR WAM ABSOLUTE LYMPHOCYTE COUNT.: 0.81 x 1000/ÂµL (ref 0.60–3.70)
BKR WAM ABSOLUTE NRBC (2 DEC): 0 x 1000/ÂµL (ref 0.00–1.00)
BKR WAM ANC (ABSOLUTE NEUTROPHIL COUNT): 5.58 x 1000/ÂµL (ref 2.00–7.60)
BKR WAM BASOPHIL ABSOLUTE COUNT.: 0.02 x 1000/ÂµL (ref 0.00–1.00)
BKR WAM BASOPHILS: 0.3 % (ref 0.0–1.4)
BKR WAM EOSINOPHIL ABSOLUTE COUNT.: 0.25 x 1000/ÂµL (ref 0.00–1.00)
BKR WAM EOSINOPHILS: 3.4 % (ref 0.0–5.0)
BKR WAM HEMATOCRIT (2 DEC): 23.9 % — ABNORMAL LOW (ref 38.50–50.00)
BKR WAM HEMOGLOBIN: 7.7 g/dL — ABNORMAL LOW (ref 13.2–17.1)
BKR WAM IMMATURE GRANULOCYTES: 0.4 % (ref 0.0–1.0)
BKR WAM LYMPHOCYTES: 10.9 % — ABNORMAL LOW (ref 17.0–50.0)
BKR WAM MCH (PG): 24.1 pg — ABNORMAL LOW (ref 27.0–33.0)
BKR WAM MCHC: 32.2 g/dL (ref 31.0–36.0)
BKR WAM MCV: 74.7 fL — ABNORMAL LOW (ref 80.0–100.0)
BKR WAM MONOCYTE ABSOLUTE COUNT.: 0.76 x 1000/ÂµL (ref 0.00–1.00)
BKR WAM MONOCYTES: 10.2 % (ref 4.0–12.0)
BKR WAM MPV: 11.4 fL (ref 8.0–12.0)
BKR WAM NEUTROPHILS: 74.8 % — ABNORMAL HIGH (ref 39.0–72.0)
BKR WAM NUCLEATED RED BLOOD CELLS: 0 % (ref 0.0–1.0)
BKR WAM PLATELETS: 174 x1000/ÂµL (ref 150–420)
BKR WAM RDW-CV: 15.9 % — ABNORMAL HIGH (ref 11.0–15.0)
BKR WAM RED BLOOD CELL COUNT.: 3.2 M/ÂµL — ABNORMAL LOW (ref 4.00–6.00)
BKR WAM WHITE BLOOD CELL COUNT: 7.5 x1000/ÂµL (ref 4.0–11.0)

## 2022-12-19 LAB — ELECTROLYTE PANEL, URINE, RANDOM     (BH GH LMW YH)
BKR CHLORIDE, URINE, RANDOM: 68 mmol/L
BKR POTASSIUM, URINE, RANDOM: 16 mmol/L
BKR SODIUM, URINE RANDOM: 79 mmol/L

## 2022-12-19 LAB — BASIC METABOLIC PANEL
BKR ANION GAP: 12 (ref 7–17)
BKR ANION GAP: 12 (ref 7–17)
BKR BLOOD UREA NITROGEN: 104 mg/dL — ABNORMAL HIGH (ref 8–23)
BKR BLOOD UREA NITROGEN: 102 mg/dL — ABNORMAL HIGH (ref 8–23)
BKR BUN / CREAT RATIO: 16.1 (ref 8.0–23.0)
BKR BUN / CREAT RATIO: 15.9 (ref 8.0–23.0)
BKR CALCIUM: 8.7 mg/dL — ABNORMAL LOW (ref 8.8–10.2)
BKR CALCIUM: 8.3 mg/dL — ABNORMAL LOW (ref 8.8–10.2)
BKR CHLORIDE: 112 mmol/L — ABNORMAL HIGH (ref 98–107)
BKR CHLORIDE: 106 mmol/L (ref 98–107)
BKR CO2: 15 mmol/L — ABNORMAL LOW (ref 20–30)
BKR CO2: 19 mmol/L — ABNORMAL LOW (ref 20–30)
BKR CREATININE: 6.44 mg/dL — ABNORMAL HIGH (ref 0.40–1.30)
BKR CREATININE: 6.4 mg/dL — ABNORMAL HIGH (ref 0.40–1.30)
BKR EGFR, CREATININE (CKD-EPI 2021): 7 mL/min/{1.73_m2} — ABNORMAL LOW (ref >=60)
BKR EGFR, CREATININE (CKD-EPI 2021): 7 mL/min/{1.73_m2} — ABNORMAL LOW (ref >=60)
BKR GLUCOSE: 139 mg/dL — ABNORMAL HIGH (ref 70–100)
BKR GLUCOSE: 134 mg/dL — ABNORMAL HIGH (ref 70–100)
BKR POTASSIUM: 5.4 mmol/L — ABNORMAL HIGH (ref 3.3–5.3)
BKR POTASSIUM: 5.6 mmol/L — ABNORMAL HIGH (ref 3.3–5.3)
BKR SODIUM: 139 mmol/L (ref 136–144)
BKR SODIUM: 137 mmol/L (ref 136–144)

## 2022-12-19 LAB — HEMOGLOBIN A1C
BKR ESTIMATED AVERAGE GLUCOSE: 137 mg/dL
BKR HEMOGLOBIN A1C: 6.4 % — ABNORMAL HIGH (ref 4.0–5.6)

## 2022-12-19 LAB — URINE CULTURE

## 2022-12-19 LAB — CREATININE, URINE, RANDOM: BKR CREATININE, URINE, RANDOM: 43 mg/dL

## 2022-12-19 MED ORDER — HALOPERIDOL LACTATE 5 MG/ML INJECTION SOLUTION
5 mg/mL | Freq: Once | INTRAMUSCULAR | Status: CP
Start: 2022-12-19 — End: ?
  Administered 2022-12-20: 04:00:00 5 mL via INTRAMUSCULAR

## 2022-12-19 MED ORDER — SODIUM ZIRCONIUM CYCLOSILICATE 10 GRAM ORAL POWDER PACKET
10 | Freq: Once | ORAL | Status: CP
Start: 2022-12-19 — End: ?
  Administered 2022-12-19: 22:00:00 10 gram via ORAL

## 2022-12-19 MED ORDER — HEPARIN (PORCINE) 5,000 UNIT/ML INJECTION SOLUTION
5000 unit/mL | Freq: Three times a day (TID) | SUBCUTANEOUS | Status: DC
Start: 2022-12-19 — End: 2022-12-22
  Administered 2022-12-19 – 2022-12-22 (×6): 5000 mL via SUBCUTANEOUS

## 2022-12-19 MED ORDER — SODIUM ZIRCONIUM CYCLOSILICATE 10 GRAM ORAL POWDER PACKET
10 | Freq: Once | ORAL | Status: CP
Start: 2022-12-19 — End: ?
  Administered 2022-12-19: 13:00:00 10 gram via ORAL

## 2022-12-19 NOTE — ED Notes
 7:10 AM Received report from Onalee Hua, Charity fundraiser. Assumed care of pt at this time. Pt resting comfortably in stretcher with side rails up x2. NAD, equal chest rise and fall noted. Per offgoing nurse transport initiated for transfer to [facility]. Care ongoing.

## 2022-12-19 NOTE — Progress Notes
 [facility]    Medicine Progress NoteAttending Provider: Georgiann Hahn, MDSubjective: Reason for Admission:  Mechanical fall with Aki on CKDInterim History:  Patient seen and examined by me today.  Mentions having progressive weakness for the past few days.Review of Meds: Scheduled:Current Facility-Administered Medications Medication Dose Route Frequency Provider Last Rate Last Admin  carvediloL (COREG) Immediate Release tablet 12.5 mg  12.5 mg Oral BID WC Renaye Rakers, MD   12.5 mg at 12/19/22 7829  clopidogreL (PLAVIX) tablet 75 mg  75 mg Oral Daily Renaye Rakers, MD   75 mg at 12/19/22 5621  escitalopram oxalate (LEXAPRO) tablet 5 mg  5 mg Oral Daily Renaye Rakers, MD   5 mg at 12/19/22 3086  [Held by provider] furosemide (LASIX) tablet 20 mg  20 mg Oral BID Renaye Rakers, MD   20 mg at 12/18/22 1145  heparin (PORCINE) injection 5,000 Units  5,000 Units Subcutaneous Q8H Georgiann Hahn, MD   5,000 Units at 12/19/22 1326  isosorbide mononitrate (IMDUR) 24 hr tablet 30 mg  30 mg Oral Daily Bronson Ing, MD   30 mg at 12/19/22 0917  rosuvastatin (CRESTOR) tablet 40 mg  40 mg Oral Daily Renaye Rakers, MD   40 mg at 12/19/22 0917  tamsulosin (FLOMAX) 24 hr capsule 0.4 mg  0.4 mg Oral BID Renaye Rakers, MD   0.4 mg at 12/19/22 5784 PRN: acetaminophen, emollient combination, ipratropium-albuteroL, magnesium hydroxide, melatonin, ondansetron (PF), ondansetron, phenoL, polyethylene glycol, sennaContinuous Infusions: Objective: Vitals:Temp:  [97.7 ?F (36.5 ?C)-99.6 ?F (37.6 ?C)] 98.3 ?F (36.8 ?C)Pulse:  [65-75] 65Resp:  [16-22] 18BP: (118-155)/(48-88) 139/64SpO2:  [95 %-100 %] 99 %Device (Oxygen Therapy): room airI/O:Gross Totals (Last 24 hours) at 12/19/2022 1403Last data filed at 12/19/2022 0900Intake -- Output 3000 ml Net -3000 ml Physical Exam:	GEN: NADHEENT: MMM, sclera non-icteric, EOMIPULM: CTAB, no wheezing or cracklesCV: RRR, no MRG appreciatedABD: soft, NTND, normal BSEXT:  1+ edema in bilateral lower extremities.SKIN: no rashesNEURO: no focal deficits.  No upper extremity tremors.Labs:Recent Labs Lab 10/20/240550 10/21/240617 WBC 6.7 7.5 HGB 7.3* 7.7* HCT 22.70* 23.90* PLT 186 174  Recent Labs Lab 10/20/240550 10/21/240617 NEUTROPHILS 67.0 74.8*  Recent Labs Lab 10/20/240550 10/20/240740 10/20/240855 10/20/241659 10/21/240617 NA 140  --  139  --  139 K 5.7*  --  5.3  --  5.4* CL 110*  --  111*  --  112* CO2 16*  --  14*  --  15* BUN 111*  --  106*  --  104* CREATININE 6.92*  --  6.51*  --  6.44* GLU 123*   < > 134*   < > 139* ANIONGAP 14  --  14  --  12  < > = values in this interval not displayed.  Recent Labs Lab 10/20/240550 10/20/240855 10/21/240617 CALCIUM 8.6* 8.5* 8.7* MG 1.6*  --   --   No results for input(s): ALT, AST, ALKPHOS, BILITOT, BILIDIR in the last 168 hours. No results for input(s): PTT, LABPROT, INR in the last 168 hours. FINGERSTICKS: Recent Labs Lab 10/20/240740 10/20/240808 10/20/240851 10/20/240855 10/20/241659 10/21/240617 GLU 170* 168* 123* 134* 137* 139* Micro:Lab Results Component Value Date  LABURIN  12/18/2022   Less than 10,000 CFU/mL. Clinical significance is unlikely for organism(s) present in quantities of less than 10,000 CFU/mL.  LABURIN 50,000-99,000 CFU/mL Enterococcus faecalis (A) 09/14/2022  LABURIN 10,000-49,000 CFU/mL Aerococcus urinae (A) 09/14/2022  LABURIN 50,000-99,000 CFU/mL Enterococcus  faecalis (A) 04/22/2022  LABURIN >=100,000 CFU/mL Aerococcus urinae (A) 04/22/2022 Diagnostics:ECG/TeleEvents:No ECG ordered todayAssessment: 89 year old gentleman with past medical history of CAD, CKD stage 3, hypertension, hyperlipidemia, left carotid endarterectomy, AAA, urinary retention, lumbar spinal stenosis with neurogenic claudication and aneurysmal disorder admitted with mechanical fall and Aki on CKD.Plan: # AKI on CKD with urinary retention:-likely obstructive AKI.  Will get renal ultrasound.-followup urine electrolytes and urine creatinine.-status post Foley placement.-trend BMP daily.  If creatinine does not decrease by tomorrow then will consult kidney team.-hold losartan and Lasix.-continue with Flomax 0.4 mg b.i.d.-avoid nephrotoxins.# Mechanical fall:-[place] head cervical spine with no evidence of intracranial abnormality or cervical spine fracture or traumatic subluxation.- PT eval.#CAD, HTN and HLD:-continue with Plavix, Coreg and statin.-continue with Imdur 30 mg daily.-continue to hold losartan and Lasix in the setting of AKI.#AAA- seen by Vascular on 10/2022, found to have 4.5 cm transverse and 4.8 cm AP. -No surgical recommendations a this time.# Spinal stenosis with adjustment disorder:-continue with gabapentin 100 mg b.i.d..-continue with Lexapro 5 mg daily.#Hyperkalemia:- will get lokelma.# Hospital Issues- F: none- E: replete PRN- N:  Renal diet- GI: bowel regimen- PPx:  Heparin subcutaneous- Code Status:  Full code# Dispo:  Pending improvementFull CodeMedical decision making for this patient was high.I spent 50 minutes today on this encounter before, during and after the visit, reviewing labs and records, evaluating and examining the patient, entering orders and documenting the visit. Signed:Zeppelin Beckstrand Chales Abrahams, MDAssistant Clinical ProfessorDepartment of MedicineYale Knightsbridge Surgery Center hospitalContact via MHBDISCLAIMER:  This note was generated in part using voice recognition software, occasional wrong-word or sound-a-like substitutions may have occurred due to the inherent limitations of voice recognition software. Read the chart carefully and recognize, using context, where the substitutions have occurred. Although every effort was made to edit the content, transcription and typing errors may occur. Please contact me if clarification of content is felt warranted. All due diligence and care undertaken in caring for this patient. Please review chart with your PCP after discharge for important information.

## 2022-12-19 NOTE — Plan of Care
 Inpatient Occupational Therapy EvaluationDefault Flowsheet Data (most recent)   IP Adult OT Eval/Treat - 12/19/22 1051    Date of Visit / Treatment  Date of Visit / Treatment 12/19/22   Note Type Evaluation   Progress Report Due 01/02/23   Start Time 1013   End Time 1051   Total Treatment Time 38    General Information  Pertinent History Of Current Problem 89 yo male presents with a mechanical fall and found to have AKI on CKD. [place] head/c-spine (-). PMHx: CAD, CKD-III, HTN, HL, s/p left carotid endarterectomy, AAA, urinary retention, lumbar spinal stenosis with neurogenic claudication and adjustment disorder   Subjective agreeable   General Observations supine in bed, tele, foley, cleared as tolerated   Precautions/Limitations Fall Precautions;Bed alarm;Chair alarm    Weight Bearing Status  Weight Bearing Status WNL - Within normal limits    Prior Level of Functioning/Social History  Additional Comments Pt lives in second floor condominium with stairs to enter. Ambulates with rollator, shower chair in shower. Daughter lives on first floor unit, provides prn assist. Pt modI for all mobility and ADLs until 1 week ago when began requiring increased assist from daughter and grandson-in-law.    Vital Signs and Orthostatic Vital Signs  Vital Signs Free text RA NAD. HR in 70s throughout    Pain/Comfort  Pain Comment (Pre/Post Treatment Pain) no c/o    Patient Coping  Observed Emotional State accepting    Cognition  Overall Cognitive Status Within Functional Limits   Cognition Comments slow to respond, HOH, follows simple commands with increased time and cueing    Vision/ Hearing  Hearing difficulties / Use of hearing aids HOH    Range of Motion  Range of Motion Examination bilateral upper extremity ROM was WFL;bilateral lower extremity ROM was [facility]    Manual Muscle Testing  Manual Muscle Testing Comments grossly at least 3+/5 Muscle Tone  Muscle Tone Testing Results No muscle tone deficits noted    Neurologic  Neurologic Assessment Comments tremulous in all extremities    Coordination  Finger to Nose WFL - Within Functional Limits   Opposition WFL - Within Functional Limits    Sensory Assessment  Sensory Tests Results No sensory impairment noted    Skin Assessment  Skin Assessment See Nursing Documentation    Balance  Sitting Balance: Static  FAIR-      Contact Guard to maintain static position with no Assistive Device   Sitting Balance: Dynamic  POOR+   Moves through 1/2 range with minimal assist to right self   Standing Balance: Static POOR      Moderate assist to maintain static position with no Assistive Device   Standing Balance: Dynamic  POOR     Moves through 1/4 to 1/2 ROM range with moderate assist to right self   Balance Assist Device Rolling walker   Balance Skills Training Comment AX2    ADL: Lower Body Dressing  Independence/Assistance Level Total assist/dependent   Clothing Items hospital socks    ADL: Eating/Self-Feeding  Independence/Assistance Level Complete independence     AM-PAC - Daily Activity IP Short Form  Help needed from another person putting on/taking off regular lower body clothing 1 - Unable   Help needed from another person for bathing (incl. washing, rinsing, drying) 2 - A Lot   Help needed from another person for toileting (incl. using toilet, bedpan, urinal) 2 - A Lot   Help needed from another person putting on/taking off regular upper body clothing  2 - A Lot   Help needed from another person taking care of personal grooming such as brushing teeth 3 - A Little   Help needed from another person eating meals 4 - None   AM-PAC Daily Activity Raw Score (Total of rows above) 14   CMS Score (based on Raw Score - with G Code) 14 - 59.67% impaired      (G Code - CK)    Bed Mobility  Supine-to-Sit Independence/Assistance Level Moderate assist;Assist of 2   Supine-to-Sit Assist Device Head of bed elevated   Sit-to-Supine Independence/Assistance Level Moderate assist;Assist of 2   Sit-to-Supine Assist Device No device    Sit-Stand Transfer Training  Sit-to-Stand Transfer Independence/Assistance Level Moderate assist;Assist of 2   Sit-to-Stand Transfer Assist Device Rolling walker   Stand-to-Sit Transfer Independence/Assistance Level Moderate assist;Assist of 2   Stand-to-Sit Transfer Assist Device Rolling walker    Functional Ambulation  Independence/Assistance Level  Moderate assist;Assist of 2   Assistive Device  Rolling walker   Gait Distance sidesteps   Functional Ambulation Comments retropulsive, fatigued quickly. cues for posture and pacing. x2-3 sidesteps towards [facility]    Handoff Documentation  Handoff Patient in bed;Bed alarm;Patient instructed to call nursing for mobility;Discussed with nursing    Therapeutic Functional Activity  Interventions to support occupations Engaged in functional transfers in preparation for ADL tasks;Engaged in functional transfers in preparation for occupation-based tasks;Challenged activity tolerance to increase independence with occupation-based tasks;Challenged activity tolerance to increase independence with ADLs    Clinical Impression  Initial Assessment tol OT eval fair, AX2 ADLs and transfers, limited by weakness and deconditioning, would benefit from moderate complexity support upon d/c   Criteria for Skilled Therapeutic Interventions Met yes;treatment indicated   Rehab Potential good, to achieve stated therapy goals    Patient/Family Stated Goals  Patient/Family Stated Goal(s) feel better    Frequency/Equipment Recommendations  OT Frequency 3x per week   Next Treatment Expected 12/21/22   OT/OTA completing this assessment Arlys John    OT Recommendations for Inpatient Admission  ADL Recommendations assist of 2;mechanical lift Education - Learning Assessment  Education Topic Functional mobility techniques   Learners Patient   Readiness Acceptance   Method Explanation   Response Verbalizes Understanding    Planned Treatment / Interventions  Education Treatment / Interventions Patient Education / Training    OT Discharge Summary  Disposition Recommendation Moderate complexity support and therapy to progress functional mobility/ ADLs/ IADLs recommended for post- acute care.  See assessment for additional details.   Equipment Recommendations for Discharge [place] lift;Wheelchair     Problem: Occupational Therapy GoalsGoal: Occupational Therapy GoalsDescription: OT Goals:1) Modified independent all aspects of toileting.2) Modified independent bed<->Br LRD3) Modified independent LB ADLs AE PRN4) Independent HEP Outcome: Interventions implemented as appropriate Plan of Care Overview/ Patient Status

## 2022-12-19 NOTE — Plan of Care
 Plan of Care Overview/ Patient Status    Admission Note Nursing Mark [name] is a 89 y.o. male admitted with a chief complaint of mechanical fall, found to have AKI 2/2 urinary retention. Foley catheter inserted in [facility] ED. Patient arrived from  [facility] ED. Patient is  Orientation: oriented x 4Level of Consciousness: alert Vitals:  12/19/22 0500 12/19/22 0600 12/19/22 0646 12/19/22 0859 BP: (!) 153/60 (!) 148/88 (!) 148/88 (!) 155/73 Pulse: 75 75 74 70 Resp: 16  16 18  Temp:   98.9 ?F (37.2 ?C) 98.1 ?F (36.7 ?C) TempSrc:    Oral SpO2: 96% 96% 97% 96% Oxygen therapy Oxygen TherapySpO2: 96 %Device (Oxygen Therapy): room airI have reviewed the patient's current medication orders..I have reviewed patient valuables Belongings charted in last 7 days: Patient Valuables   Patient Valuables Flowsheet                    PATIENT VALUABLE(S)       Cell phone disposition At bedside/locker/closet 12/19/22 1019     0700-1900Neuro: AAOx4, very forgetful and HOH. Mood/behavior: Calm and cooperative with care. MSK: Admitted for fall, trauma workup (-), baseline ambulates around w/ cane. Patient very weak, unable to lift LLE off the bed, minimal RLE off the bed. PT session completed. Patient noted to have mild tremors throughout. CV: NSR on tele w/ PACs. HR and BP stable. Denies CP and palpitations. Resp: Dim lungs on RA. Satting well on RA. Denies SOB and DOE. GI: Round/soft abdomen, last BM 10/20 per patient. GU: Foley catheter in place for urinary retention. Neurovascular: + pulse, motor, and sensory to all extremities. Skin: Large area of blanchable redness to coccyx. Pain: Denies. Diet: Regular diet. Access/Infusions: x1 PIV to L. Electronically Signed by Maricela Bo, RN, October 21, 2024Patient becoming more confused as shift progresses stating  what's going on here this isnt the hospital. Patient able to be re-directed at this time but starting to pull on clothes on monitor lines. Electronically Signed by Maricela Bo, RN, December 19, 2022 12:56 PM

## 2022-12-19 NOTE — ED Provider Notes
 Chief Complaint Patient presents with  Fall   Pt unwitnessed fall, no head strike, + thinners, no obvious deformity. Pt down for 30 minutes, pt has ambulation difficulty & baseline. MDM HPI/MDM:Pt is a 89 y.o. male w/ PMH of HTN, HLD who presents with reported mechanical fall at home.  States he was trying to go to the bathroom to urinate and fell from standing hitting his head and neck.  Currently complaining of neck pain.  Daughter called EMS and was brought here for further evaluation.  Denies syncope, headache, nausea, vomiting, chest pain, palpitations, shortness of breath or any other symptoms.Well-appearing, calm, cooperative, no acute distress. VSS. Afebrile, RRR, O2 sat 98% on room air. Lungs CTAB, Abdomen soft non-tender no rigidity or guarding. Extremities wwp.Concern for intracranial injuryUnclear etiology of fall, will obtain EKG and infectious workup as well as metabolic workupDispo pending lab work and imagingPE/Vital signs:BP (!) 148/88  - Pulse 74  - Temp 98.9 ?F (37.2 ?C)  - Resp 16  - SpO2 97% See aboveED Course:Clinical Impressions as of 12/19/22 0814 Fall, initial encounter AKI (acute kidney injury) (HC Code) Hypomagnesemia Hyperkalemia Anemia, unspecified type Workup significant for severe AKI, hypomagnesemia, mild hyperkalemia.  Possible UTI as well.  EKG shows no changes.  Plan for admission to medicine pending [place] negativeEvaluated with Dr. Kathaleen Bury. All plans made in coordination with attending. Please see attending note for final disposition and plan.Nadeem ObaydouEmergency Medicine, [id number]) [phone]**Portions of this note were created using M*Modal dictation software. Efforts were made to ensure accuracy, however transcription error might be present despite proofreading. If there are any questions, please do not hesitate to reach out and call for clarification.Continuity of Care Note:Patient signed out to me by previous provider, here with fall found to have multiple electrolytes abnormalities including hyperkalemia, pending trauma work-up, if negative admit to Medicine.10:30 AM Trauma work-up negative, will admit to Medicine for AKI, hyperkalemia improved s/p shift on repeat BMP. Staci Acosta, MD, MPHPGY-4 Emergency MedicineAvailable on [facility] 12/19/2022 6:50 AM Physical ExamED Triage VitalsBP: (!) 152/75 [12/18/22 0507]Pulse: 68 [12/18/22 0507]Pulse from  O2 sat: 69 [12/18/22 0600]Resp: 16 [12/18/22 0507]Temp: 97.4 ?F (36.3 ?C) [12/18/22 0507]Temp src: Oral [12/18/22 0507]SpO2: 98 % [12/18/22 0507] BP (!) 148/88  - Pulse 74  - Temp 98.9 ?F (37.2 ?C)  - Resp 16  - SpO2 97% Physical Exam ProceduresAttestation/Critical CarePatient Reevaluation: Attending Supervised: ResidentI saw and examined the patient. I agree with the findings and plan of care as documented in the resident's note except as noted below. Additional acute and/or chronic problems addressed: Fayrene Fearing DaleyPatient here status post mechanical fall.  Trauma exam as above, workup with hyperkalemia, management and process at time of sign-out, admit further management.Signed out to me by Dr. Kathaleen Bury at 0700S/p mechanical fallTrauma workup negativeHead/neck [place] negativePatient with AKI Cr 6.92Hyperkalemia - no significant ECG changesWill repeat Chem 7Admit to medicine - inpatient Renal consultCritical care provided by attending: critical careCritical care time (minutes): 30.Critical care time was exclusive of separately billable procedures and teaching time.Critical care was necessary to treat or prevent imminent or life-threatening deterioration of the following conditions:  Renal failure.Critical care was time spent personally by me on the following activities: blood draw for specimens, ordering and review of laboratory studies, ordering and review of radiographic studies, pulse oximetry, development of treatment plan with patient or surrogate, discussions with consultants, re-evaluation of patient's condition, review of old charts and examination of patient.Patient progress: stableClinical Impressions as of 12/19/22 0814 Fall, initial encounter AKI (acute kidney injury) (HC  Code) Hypomagnesemia Hyperkalemia Anemia, unspecified type  ED DispositionAdmit Laurene Footman, MDResident10/20/24 [id number], Marliss Czar, MD10/20/24 0815 Lajoyce Corners, MD10/20/24 0819 Renaye Rakers, MDResident10/20/24 [address], MD10/20/24 1349 Mercy Riding, MD10/21/24 [phone]

## 2022-12-19 NOTE — ED Notes
 11:06 PM report from outgoing nurse; pt pleasantly confused, pulling at lines, accidentally pulls out [id number] pt placed on bedpan, no ouput, pericare provided, linens changed, will cont to monitor0500 beverage provided0550 pt assigned bed at src; attempted to call pt daughter to update on pt status and plan to transfer to src but no answer at contact number, voice mail box [id number] pt increasingly confused, uncooperative, pulling off clothes and monitor lines, threatens to leave, attempts to get oob; pt redirectable, spoke w pt daughter about pt status and plan to transfer to src, daughter agreeable to plan, states she will visit father here first if possible; pt more directable following report of conversation w daughter, will cont to monitor0710 report to incoming nurse

## 2022-12-19 NOTE — Plan of Care
 Inpatient Physical Therapy EvaluationDefault Flowsheet Data (last 24 hours)   IP Adult PT Eval/Treat   Row Name 12/19/22 1052 12/19/22 1050   Date of Visit / Treatment  Date of Visit / Treatment -- 12/19/22  Note Type -- Evaluation  Progress Report Due -- 01/02/23  Start Time -- 1012  End Time -- 1050  Total Treatment Time -- 38 min  Co-Treatment Performed -- Co-treatment performed with separate and distinct therapeutic interventions between PT and OT for concurrent facilitation of balance, postural control, mobility and/or ADL training   General Information  Pertinent History Of Current Problem -- per chart, 89 yom with CAD, CKD-III, HTN, HL, s/p left carotid endarterectomy, AAA, urinary retention, lumbar spinal stenosis with neurogenic claudication and adjustment disorder now presents with a mechanical fall and found to have AKI on CKD.   Subjective -- reports he can't roll in bed  General Observations -- Pt received semi supine in bed, family at bedside, tele, foley. RN cleared for mobility.  Precautions/Limitations -- Fall Precautions;Bed alarm  Fall History -- yes, reason for admission   Prior Level of Functioning/Social History  Additional Comments -- Pt lives in second floor condominium with stairs to enter. Ambulates with rollator, shower chair in shower. Daughter lives on first floor unit, provides prn assist. Pt modI for all mobility and ADLs until 1 week ago when began requiring increased assist from daughter and grandson-in-law.   Vital Signs and Orthostatic Vital Signs  Vital Signs Free text -- RA, NAD. HR 70s per tele throughout.   Pain/Comfort  Pain Comment (Pre/Post Treatment Pain) -- no c/o pain   Cognition  Orientation Level -- Oriented X4  Level of Consciousness -- alert  Following Commands -- Follows one step commands without difficulty  Personal Safety / Judgment -- Fall risk   Vision/ Hearing  Hearing difficulties / Use of hearing aids -- HOH   Range of Motion  Range of Motion Examination -- bilateral upper extremity ROM was WFL;bilateral lower extremity ROM was [facility]   Manual Muscle Testing  Manual Muscle Testing Comments -- grossly 3+/5 in BUEs, 3/5 in BLEs   Muscle Tone  Muscle Tone Testing Results -- No muscle tone deficits noted   Coordination  Finger to Nose -- WFL - Within Functional Limits  Coordination Comments -- tremors in all extremities at rest, exacerbated by mobility   Skin Assessment  Skin Assessment -- See Nursing Documentation   Balance  Sitting Balance: Static  -- FAIR-      Contact Guard to maintain static position with no Assistive Device  Sitting Balance: Dynamic  -- POOR+   Moves through 1/2 range with minimal assist to right self  Standing Balance: Static -- POOR      Moderate assist to maintain static position with no Assistive Device  Standing Balance: Dynamic  -- POOR     Moves through 1/4 to 1/2 ROM range with moderate assist to right self  Balance Assist Device -- Rolling walker   Bed Mobility  Supine-to-Sit Independence/Assistance Level -- Moderate assist;Assist of 2  Supine-to-Sit Assist Device -- Bed rails;Draw pad;Head of bed elevated  Sit-to-Supine Independence/Assistance Level -- Moderate assist;Assist of 2  Sit-to-Supine Assist Device -- No device  Bed Mobility Comments -- cues for body mechanics, assist for trunk and BLEs   Sit-Stand Transfer Training  Sit-to-Stand Transfer Independence/Assistance Level -- Moderate assist;Assist of 2  Sit-to-Stand Transfer Assist Device -- Rolling walker  Stand-to-Sit Transfer Independence/Assistance Level -- Moderate assist;Assist of 2  Stand-to-Sit  Transfer Assist Device -- Arboriculturist Analysis Impairments -- impaired balance;decreased strength;decreased endurance/activity tolerance;impaired coordination  Sit-Stand Transfer Comments -- cues for hand placement Stand-Sit Transfer Comments -- decreased eccentric control   Gait Training  Independence/Assistance Level  -- Moderate assist;Assist of 2  Assistive Device  -- Rolling walker  Gait Distance -- sidesteps  Gait Analysis Impairments -- impaired balance;impaired coordination;decreased strength;decreased endurance/activity tolerance  Gait Training Comments -- cues for sequencing, decreased step length and height bilaterally   Handoff Documentation  Handoff -- Patient in bed;Bed alarm;Patient instructed to call nursing for mobility;Discussed with nursing   PT- AM-PAC - Basic Mobility Screen- How much help from another person do you currently need.....  Turning from your back to your side while in a a flat bed without using rails? -- 2 - A Lot - Requires a lot of help (maximum to moderate assistance). Can use assistive devices.  Moving from lying on your back to sitting on the side of a flat bed without using bed rails? -- 1 - Total - Requires total assistance or cannot do it at all.  Moving to and from a bed to a chair (including a wheelchair)? -- 1 - Total - Requires total assistance or cannot do it at all.  Standing up from a chair using your arms(e.g., wheelchair or bedside chair)? -- 1 - Total - Requires total assistance or cannot do it at all.  To walk in a hospital room? -- 1 - Total - Requires total assistance or cannot do it at all.  Climbing 3-5 steps with a railing? -- 1 - Total - Requires total assistance or cannot do it at all.  AMPAC Mobility Score -- 7  TARGET Highest Level of Mobility -- Mobility Level 2, Turn self in bed/bed activity/dependent transfer   ACTUAL Highest Level of Mobility -- Mobility Level 5, Stand for 1 minute   Clinical Impression  Initial Assessment -- Pt presents to PT evaluation with generalized weakness and deconditioning along with impaired balance and tremors. Requires assist of 2 to mobilize, family reports pt independent with mobility ~1 week ago. Pt at high risk for falls, requires continued therapy to progress functional mobility.  Criteria for Skilled Therapeutic Interventions Met -- yes;treatment indicated  Rehab Potential -- good, to achieve stated therapy goals   Patient/Family Stated Goals  Patient/Family Stated Goal(s) -- get stronger   Frequency/Equipment Recommendations  PT Frequency -- 3x per week  Next Treatment Expected -- 12/21/22  PT/PTA completing this assessment -- Trevon Strothers  Equipment Needs During Admission/Treatment -- Rolling walker   PT Recommendations for Inpatient Admission  Activity/Level of Assist -- transfers only;assist of 2;stedy   Education - Learning Assessment   Education Topic -- Functional mobility techniques;ADL techniques;Safety;Therapy role/rehab process;Discharge planning;Assistance/guarding  Learners -- Patient  Readiness -- Acceptance  Method -- Explanation  Response -- Verbalizes Understanding   PT Discharge Summary  Physical Therapy Disposition Recommendation -- Moderate complexity support and therapy to progress functional mobility/ ADLs/ IADLs recommended for post- acute care.  See assessment for additional details.    Problem: Physical Therapy GoalsGoal: Physical Therapy GoalsDescription: PT GOALS1. Patient will perform bed mobility with minimal assist2. Patient will perform transfers with minimal assist using appropriate assistive device 3. Patient will ambulate a minimum of 50 feet with minimal assist using appropriate assistive device Outcome: Initial problem identification Harrel Carina, PT, DPT

## 2022-12-20 LAB — CBC WITHOUT DIFFERENTIAL
BKR WAM ANC (ABSOLUTE NEUTROPHIL COUNT): 4.42 x 1000/ÂµL (ref 2.00–7.60)
BKR WAM HEMATOCRIT (2 DEC): 22.3 % — ABNORMAL LOW (ref 38.50–50.00)
BKR WAM HEMOGLOBIN: 7.1 g/dL — ABNORMAL LOW (ref 13.2–17.1)
BKR WAM MCH (PG): 23.4 pg — ABNORMAL LOW (ref 27.0–33.0)
BKR WAM MCHC: 31.8 g/dL (ref 31.0–36.0)
BKR WAM MCV: 73.6 fL — ABNORMAL LOW (ref 80.0–100.0)
BKR WAM MPV: 9.8 fL (ref 8.0–12.0)
BKR WAM PLATELETS: 150 x1000/ÂµL (ref 150–420)
BKR WAM RDW-CV: 15.7 % — ABNORMAL HIGH (ref 11.0–15.0)
BKR WAM RED BLOOD CELL COUNT.: 3.03 M/ÂµL — ABNORMAL LOW (ref 4.00–6.00)
BKR WAM WHITE BLOOD CELL COUNT: 7 x1000/ÂµL (ref 4.0–11.0)

## 2022-12-20 LAB — BASIC METABOLIC PANEL
BKR ANION GAP: 13 (ref 7–17)
BKR BLOOD UREA NITROGEN: 99 mg/dL — ABNORMAL HIGH (ref 8–23)
BKR BUN / CREAT RATIO: 17.1 (ref 8.0–23.0)
BKR CALCIUM: 8.3 mg/dL — ABNORMAL LOW (ref 8.8–10.2)
BKR CHLORIDE: 107 mmol/L (ref 98–107)
BKR CO2: 17 mmol/L — ABNORMAL LOW (ref 20–30)
BKR CREATININE: 5.8 mg/dL — ABNORMAL HIGH (ref 0.40–1.30)
BKR EGFR, CREATININE (CKD-EPI 2021): 8 mL/min/{1.73_m2} — ABNORMAL LOW (ref >=60)
BKR GLUCOSE: 120 mg/dL — ABNORMAL HIGH (ref 70–100)
BKR POTASSIUM: 5.4 mmol/L — ABNORMAL HIGH (ref 3.3–5.3)
BKR SODIUM: 137 mmol/L (ref 136–144)

## 2022-12-20 LAB — CBC WITH AUTO DIFFERENTIAL
BKR WAM ABSOLUTE IMMATURE GRANULOCYTES.: 0.02 x 1000/ÂµL (ref 0.00–0.30)
BKR WAM ABSOLUTE LYMPHOCYTE COUNT.: 1.18 x 1000/ÂµL (ref 0.60–3.70)
BKR WAM ABSOLUTE NRBC (2 DEC): 0 x 1000/ÂµL (ref 0.00–1.00)
BKR WAM ANC (ABSOLUTE NEUTROPHIL COUNT): 4.93 x 1000/ÂµL (ref 2.00–7.60)
BKR WAM BASOPHIL ABSOLUTE COUNT.: 0.03 x 1000/ÂµL (ref 0.00–1.00)
BKR WAM BASOPHILS: 0.4 % (ref 0.0–1.4)
BKR WAM EOSINOPHIL ABSOLUTE COUNT.: 0.35 x 1000/ÂµL (ref 0.00–1.00)
BKR WAM EOSINOPHILS: 4.8 % (ref 0.0–5.0)
BKR WAM HEMATOCRIT (2 DEC): 21.5 % — ABNORMAL LOW (ref 38.50–50.00)
BKR WAM HEMOGLOBIN: 6.8 g/dL — ABNORMAL LOW (ref 13.2–17.1)
BKR WAM IMMATURE GRANULOCYTES: 0.3 % (ref 0.0–1.0)
BKR WAM LYMPHOCYTES: 16.2 % — ABNORMAL LOW (ref 17.0–50.0)
BKR WAM MCH (PG): 23.1 pg — ABNORMAL LOW (ref 27.0–33.0)
BKR WAM MCHC: 31.6 g/dL (ref 31.0–36.0)
BKR WAM MCV: 73.1 fL — ABNORMAL LOW (ref 80.0–100.0)
BKR WAM MONOCYTE ABSOLUTE COUNT.: 0.79 x 1000/ÂµL (ref 0.00–1.00)
BKR WAM MONOCYTES: 10.8 % (ref 4.0–12.0)
BKR WAM MPV: 10.4 fL (ref 8.0–12.0)
BKR WAM NEUTROPHILS: 67.5 % (ref 39.0–72.0)
BKR WAM NUCLEATED RED BLOOD CELLS: 0 % (ref 0.0–1.0)
BKR WAM PLATELETS: 158 x1000/ÂµL (ref 150–420)
BKR WAM RDW-CV: 15.4 % — ABNORMAL HIGH (ref 11.0–15.0)
BKR WAM RED BLOOD CELL COUNT.: 2.94 M/ÂµL — ABNORMAL LOW (ref 4.00–6.00)
BKR WAM WHITE BLOOD CELL COUNT: 7.3 x1000/ÂµL (ref 4.0–11.0)

## 2022-12-20 LAB — ERYTHROPOIETIN: ERYTHROPOIETIN (EPO), S: 8.1 m[IU]/mL (ref 2.6–18.5)

## 2022-12-20 MED ORDER — HALOPERIDOL LACTATE 5 MG/ML INJECTION SOLUTION
5 | Freq: Once | INTRAMUSCULAR | Status: CP
Start: 2022-12-20 — End: ?
  Administered 2022-12-20: 04:00:00 5 mL via INTRAMUSCULAR

## 2022-12-20 MED ORDER — SODIUM ZIRCONIUM CYCLOSILICATE 10 GRAM ORAL POWDER PACKET
10 | Freq: Once | ORAL | Status: CP
Start: 2022-12-20 — End: ?
  Administered 2022-12-20: 22:00:00 10 gram via ORAL

## 2022-12-20 MED ORDER — TRAZODONE 50 MG TABLET
50 mg | Freq: Every evening | ORAL | Status: DC
Start: 2022-12-20 — End: 2022-12-22
  Administered 2022-12-21 – 2022-12-22 (×2): 50 mg via ORAL

## 2022-12-20 MED ORDER — SODIUM ZIRCONIUM CYCLOSILICATE 10 GRAM ORAL POWDER PACKET
10 | Freq: Once | ORAL | Status: CP
Start: 2022-12-20 — End: ?
  Administered 2022-12-20: 14:00:00 10 gram via ORAL

## 2022-12-20 NOTE — Care Coordination-Inpatient
 Writer called and spoke with pt's daughter [name] to discuss PT recommendation for short term rehab.  [name] in agreement with plan.  Writer went over the [employer] with [name].Choices are as follow: [facility] @ [facility], [place], [facility] & [address] [place].   Referrals sent.CM Dept will continue to follow.Michaell Cowing Management-YNHH/SRCMHB # [phone]

## 2022-12-20 NOTE — Progress Notes
 [facility]    Medicine Progress NoteAttending Provider: Georgiann Hahn, MDSubjective: Reason for Admission: Mechanical fall with Aki on CKD Interim History:  Patient seen and examined by me today.  Overnight patient had episodes of agitation was given Haldol and was put on restraints.Review of Meds: Scheduled:Current Facility-Administered Medications Medication Dose Route Frequency Provider Last Rate Last Admin  carvediloL (COREG) Immediate Release tablet 12.5 mg  12.5 mg Oral BID WC Renaye Rakers, MD   12.5 mg at 12/20/22 1009  clopidogreL (PLAVIX) tablet 75 mg  75 mg Oral Daily Renaye Rakers, MD   75 mg at 12/20/22 1010  escitalopram oxalate (LEXAPRO) tablet 5 mg  5 mg Oral Daily Renaye Rakers, MD   5 mg at 12/20/22 1009  [Held by provider] furosemide (LASIX) tablet 20 mg  20 mg Oral BID Renaye Rakers, MD   20 mg at 12/18/22 1145  heparin (PORCINE) injection 5,000 Units  5,000 Units Subcutaneous Q8H Georgiann Hahn, MD   5,000 Units at 12/19/22 1326  isosorbide mononitrate (IMDUR) 24 hr tablet 30 mg  30 mg Oral Daily Bronson Ing, MD   30 mg at 12/20/22 1009  rosuvastatin (CRESTOR) tablet 40 mg  40 mg Oral Daily Renaye Rakers, MD   40 mg at 12/20/22 1009  sodium zirconium cyclosilicate (LOKELMA) oral powder packet 10 g  10 g Oral Once Georgiann Hahn, MD      tamsulosin [facility]) 24 hr capsule 0.4 mg  0.4 mg Oral BID Renaye Rakers, MD   0.4 mg at 12/20/22 1009  traZODone (DESYREL) tablet 50 mg  50 mg Oral Nightly Georgiann Hahn, MD     PRN: acetaminophen, emollient combination, ipratropium-albuteroL, magnesium hydroxide, melatonin, ondansetron (PF), ondansetron, phenoL, polyethylene glycol, sennaContinuous Infusions: Objective: Vitals:Temp:  [97.7 ?F (36.5 ?C)-98 ?F (36.7 ?C)] 97.8 ?F (36.6 ?C)Pulse:  [64-74] 69Resp: [18-26] 20BP: (117-161)/(61-76) 132/65SpO2:  [94 %-100 %] 100 %Device (Oxygen Therapy): room airI/O:Gross Totals (Last 24 hours) at 12/20/2022 1306Last data filed at 12/20/2022 0543Intake -- Output 3150 ml Net -3150 ml Physical Exam:	GEN: NADHEENT: MMM, sclera non-icteric, EOMIPULM: CTAB, no wheezing or cracklesCV: RRR, no MRG appreciatedABD: soft, NTND, normal BSEXT:  1+ edema in bilateral lower extremities.SKIN: no rashesNEURO: no focal deficits.  No upper extremity tremorsLabs:Recent Labs Lab 10/21/240617 10/22/240528 10/22/240809 WBC 7.5 7.3 7.0 HGB 7.7* 6.8* 7.1* HCT 23.90* 21.50* 22.30* PLT 174 158 150  Recent Labs Lab 10/20/240550 10/21/240617 10/22/240528 NEUTROPHILS 67.0 74.8* 67.5  Recent Labs Lab 10/21/240617 10/21/241531 10/22/240527 NA 139 137 137 K 5.4* 5.6* 5.4* CL 112* 106 107 CO2 15* 19* 17* BUN 104* 102* 99* CREATININE 6.44* 6.40* 5.80* GLU 139* 134* 120* ANIONGAP 12 12 13   Recent Labs Lab 10/20/240550 10/20/240855 10/21/240617 10/21/241531 10/22/240527 CALCIUM 8.6*   < > 8.7* 8.3* 8.3* MG 1.6*  --   --   --   --   < > = values in this interval not displayed.  No results for input(s): ALT, AST, ALKPHOS, BILITOT, BILIDIR in the last 168 hours. No results for input(s): PTT, LABPROT, INR in the last 168 hours. FINGERSTICKS: Recent Labs Lab 10/20/240851 10/20/240855 10/20/241659 10/21/240617 10/21/241531 10/22/240527 GLU 123* 134* 137* 139* 134* 120* Micro:Lab Results Component Value Date  LABURIN  12/18/2022   Less than 10,000 CFU/mL. Clinical significance is unlikely for organism(s) present in quantities of less than 10,000 CFU/mL.  LABURIN 50,000-99,000 CFU/mL Enterococcus faecalis (A) 09/14/2022  LABURIN  10,000-49,000 CFU/mL Aerococcus urinae (A) 09/14/2022  LABURIN 50,000-99,000 CFU/mL Enterococcus  faecalis (A) 04/22/2022  LABURIN >=100,000 CFU/mL Aerococcus urinae (A) 04/22/2022 Diagnostics:ECG/TeleEvents:No ECG ordered todayAssessment: 89 year old gentleman with past medical history of CAD, CKD stage 3, hypertension, hyperlipidemia, left carotid endarterectomy, AAA, urinary retention, lumbar spinal stenosis with neurogenic claudication and aneurysmal disorder admitted with mechanical fall and Aki on CKD. Plan: # AKI on CKD with urinary retention:-likely obstructive AKI. -renal ultrasound showed moderate left and mild right hydronephrosis which has not significantly changed from July 2024.-status post Foley placement.-creatinine decreasing from 6.9-5.8 mg/dL.-trend BMP daily.  -hold losartan and Lasix.-continue with Flomax 0.4 mg b.i.d.-avoid nephrotoxins. # Mechanical fall:-[place]worth head cervical spine with no evidence of intracranial abnormality or cervical spine fracture or traumatic subluxation.- PT eval. #CAD, HTN and HLD:-continue with Plavix, Coreg and statin.-continue with Imdur 30 mg daily.-continue to hold losartan and Lasix in the setting of AKI. #AAA- seen by Vascular on 10/2022, found to have 4.5 cm transverse and 4.8 cm AP. -No surgical recommendations a this time. # Spinal stenosis with adjustment disorder:-continue with gabapentin 100 mg b.i.d..-continue with Lexapro 5 mg daily. #Hyperkalemia:- will get lokelma. # Hospital Issues- F: none- E: replete PRN- N:  Renal diet- GI: bowel regimen- PPx:  Heparin subcutaneous- Code Status:  Full code  # Dispo:  Pending improvementFull CodeMedical decision making for this patient was high.I spent 50 minutes today on this encounter before, during and after the visit, reviewing labs and records, evaluating and examining the patient, entering orders and documenting the visit. Signed:Jaeveon Ashland Chales Abrahams, MDAssistant Clinical ProfessorDepartment of MedicineYale Encompass [facility] hospitalContact via MHBDISCLAIMER:  This note was generated in part using voice recognition software, occasional wrong-word or sound-a-like substitutions may have occurred due to the inherent limitations of voice recognition software. Read the chart carefully and recognize, using context, where the substitutions have occurred. Although every effort was made to edit the content, transcription and typing errors may occur. Please contact me if clarification of content is felt warranted. All due diligence and care undertaken in caring for this patient. Please review chart with your PCP after discharge for important information.

## 2022-12-20 NOTE — Plan of Care
 Plan of Care Overview/ Patient Status    0700-1900 Neuro: Alert/confused, oriented to self and time only (able to state the month). Afebrile. Patient is reluctant to say that he is in the hospital as he believes he is at his house. Mood/behavior: Currently calm and cooperating with care. Lap belt restraint removed this AM. Currently has no restraints. MSK: Baseline ambulates around w/ cane. Patient very weak, bedrest continues. CV: NSR on tele w/ PACs. HR and BP stable. Denies CP and palpitations. Resp: Dim lungs on RA. Satting well on RA. Denies SOB and DOE. GI: Round/soft abdomen, last BM 10/22 overnight. GU: Foley catheter in place for urinary retention. Urine is clear-yellow. Cr continues to downtrend.Neurovascular: + pulse, motor, and sensory to all extremities. Skin: Large area of blanchable redness to coccyx. Pain: Denies. Diet: Regular diet. Access/Infusions: No PIV. Removed last night by patient. Problem: Restraint, NonviolentGoal: Absence of Harm or InjuryOutcome: Interventions implemented as appropriate Problem: Violence Risk or ActualGoal: Anger and Impulse ControlOutcome: Interventions implemented as appropriate Electronically Signed by Maricela Bo, RN, December 20, 2022 12:26 PM

## 2022-12-20 NOTE — Plan of Care
 Plan of Care Overview/ Patient Status    7PM - 7AM 	Assumed care of patient at [facility]. Patient is A+O x 2, forgetful to situation and place. Patient was on tele at start of shift, tracing SR in the 60s. Tele d/c by provider. Lung sounds diminished bilaterally, O2 sat 99% on RA. Foley catheter in place, draining clear yellow urine. Patient is incontinent of BM, patient had 2 BMs overnight. Patient has blanchable redness to coccyx, Z-guard applied. Patient c/o of pain to neck and back, PRN Tylenol 975mg  administered per MAR. Patient refused heparin injection 5,000 units, Dr. Sherlon Handing notified. 11:20PM - Patient getting increasingly agitated, trying to get out of bed, attempting to pull out lines. Unable to redirect. Dr. Sherlon Handing notified, soft lap belt restraint initiated. Patient still agitated, attempting to pull out lines, unable to redirect. Dr. Sherlon Handing notified, bilateral soft wrist restraints initiated. 11:45PM - Patient agitated, Dr.Rodriguez notified, IM Haldol 0.5mg  administered per MAR.12:35AM - Patient agitated, Dr. Sherlon Handing notified, IM Haldol 1mg  administered per [facility]. 6:40AM - Bilateral soft wrist restraints removed. Soft lap belt in place. Patient refused heparin, Dr.Rodriguez notified, no new orders. Electronically Signed by Lissa Hoard, RN, December 20, 2022 6:44 AM Problem: Adult Inpatient Plan of CareGoal: Plan of Care ReviewOutcome: Interventions implemented as appropriateFlowsheets (Taken 12/20/2022 0133)Progress: no changePlan of Care Reviewed With: patient

## 2022-12-20 NOTE — Plan of Care
 Problem: Adult Inpatient Plan of CareGoal: Readiness for Transition of CareOutcome: Interventions implemented as appropriate Plan of Care Overview/ Patient Status    Pt will require STR at discharge I spoke with his dtr who is in agreement  we will need to secure [facility] secured we will need to persue authorization grom the insurance co. PASSR approved Truett Mainland RN [facility] management department [id number] [phone]   MHB / Confidential VM

## 2022-12-21 LAB — CBC WITH AUTO DIFFERENTIAL
BKR WAM ABSOLUTE IMMATURE GRANULOCYTES.: 0.03 x 1000/ÂµL (ref 0.00–0.30)
BKR WAM ABSOLUTE LYMPHOCYTE COUNT.: 1.25 x 1000/ÂµL (ref 0.60–3.70)
BKR WAM ABSOLUTE NRBC (2 DEC): 0 x 1000/ÂµL (ref 0.00–1.00)
BKR WAM ANC (ABSOLUTE NEUTROPHIL COUNT): 5.12 x 1000/ÂµL (ref 2.00–7.60)
BKR WAM BASOPHIL ABSOLUTE COUNT.: 0.04 x 1000/ÂµL (ref 0.00–1.00)
BKR WAM BASOPHILS: 0.5 % (ref 0.0–1.4)
BKR WAM EOSINOPHIL ABSOLUTE COUNT.: 0.54 x 1000/ÂµL (ref 0.00–1.00)
BKR WAM EOSINOPHILS: 6.9 % — ABNORMAL HIGH (ref 0.0–5.0)
BKR WAM HEMATOCRIT (2 DEC): 23 % — ABNORMAL LOW (ref 38.50–50.00)
BKR WAM HEMOGLOBIN: 7.5 g/dL — ABNORMAL LOW (ref 13.2–17.1)
BKR WAM IMMATURE GRANULOCYTES: 0.4 % (ref 0.0–1.0)
BKR WAM LYMPHOCYTES: 15.9 % — ABNORMAL LOW (ref 17.0–50.0)
BKR WAM MCH (PG): 23.8 pg — ABNORMAL LOW (ref 27.0–33.0)
BKR WAM MCHC: 32.6 g/dL (ref 31.0–36.0)
BKR WAM MCV: 73 fL — ABNORMAL LOW (ref 80.0–100.0)
BKR WAM MONOCYTE ABSOLUTE COUNT.: 0.89 x 1000/ÂµL (ref 0.00–1.00)
BKR WAM MONOCYTES: 11.3 % (ref 4.0–12.0)
BKR WAM MPV: 10.8 fL (ref 8.0–12.0)
BKR WAM NEUTROPHILS: 65 % (ref 39.0–72.0)
BKR WAM NUCLEATED RED BLOOD CELLS: 0 % (ref 0.0–1.0)
BKR WAM PLATELETS: 177 x1000/ÂµL (ref 150–420)
BKR WAM RDW-CV: 15.4 % — ABNORMAL HIGH (ref 11.0–15.0)
BKR WAM RED BLOOD CELL COUNT.: 3.15 M/ÂµL — ABNORMAL LOW (ref 4.00–6.00)
BKR WAM WHITE BLOOD CELL COUNT: 7.9 x1000/ÂµL (ref 4.0–11.0)

## 2022-12-21 LAB — BASIC METABOLIC PANEL
BKR ANION GAP: 13 (ref 7–17)
BKR BLOOD UREA NITROGEN: 91 mg/dL — ABNORMAL HIGH (ref 8–23)
BKR BUN / CREAT RATIO: 19 (ref 8.0–23.0)
BKR CALCIUM: 8.2 mg/dL — ABNORMAL LOW (ref 8.8–10.2)
BKR CHLORIDE: 109 mmol/L — ABNORMAL HIGH (ref 98–107)
BKR CO2: 17 mmol/L — ABNORMAL LOW (ref 20–30)
BKR CREATININE: 4.8 mg/dL — ABNORMAL HIGH (ref 0.40–1.30)
BKR EGFR, CREATININE (CKD-EPI 2021): 11 mL/min/{1.73_m2} — ABNORMAL LOW (ref >=60)
BKR GLUCOSE: 122 mg/dL — ABNORMAL HIGH (ref 70–100)
BKR POTASSIUM: 5.2 mmol/L (ref 3.3–5.3)
BKR SODIUM: 139 mmol/L (ref 136–144)

## 2022-12-21 MED ORDER — SKIM MILK
ORAL | Status: DC | PRN
Start: 2022-12-21 — End: 2022-12-21

## 2022-12-21 MED ORDER — GLUCAGON 1 MG/ML IN STERILE WATER
Freq: Once | INTRAMUSCULAR | Status: DC | PRN
Start: 2022-12-21 — End: 2022-12-21

## 2022-12-21 MED ORDER — DEXTROSE 15 GRAM/60 ML ORAL LIQUID
1560 gram/60 mL | ORAL | Status: DC | PRN
Start: 2022-12-21 — End: 2022-12-21

## 2022-12-21 MED ORDER — FRUIT JUICE
ORAL | Status: DC | PRN
Start: 2022-12-21 — End: 2022-12-21

## 2022-12-21 MED ORDER — DEXTROSE 10 % IV BOLUS FOR ORDERABLE
INTRAVENOUS | Status: DC | PRN
Start: 2022-12-21 — End: 2022-12-21

## 2022-12-21 MED ORDER — WATER PETROLATUM-MINERAL OIL-CERESIN-LANOLIN ALC CREAM
Freq: Every day | TOPICAL | Status: DC
Start: 2022-12-21 — End: 2022-12-22

## 2022-12-21 MED ORDER — INSULIN LISPRO 100 UNIT/ML (CORRECTION SCALE)
100 unit/mL | Freq: Every evening | SUBCUTANEOUS | Status: DC
Start: 2022-12-21 — End: 2022-12-21

## 2022-12-21 MED ORDER — INSULIN LISPRO 100 UNIT/ML (CORRECTION SCALE)
100 unit/mL | Freq: Three times a day (TID) | SUBCUTANEOUS | Status: DC
Start: 2022-12-21 — End: 2022-12-21

## 2022-12-21 NOTE — Plan of Care
 Inpatient Occupational Therapy Progress NoteDefault Flowsheet Data (most recent)   IP Adult OT Eval/Treat - 12/21/22 1033    Date of Visit / Treatment  Date of Visit / Treatment 12/21/22   Progress Report Due 01/02/23   Start Time 1008   End Time 1033   Total Treatment Time 25    General Information  Subjective agreeable   General Observations supine in bed, foley, tele, PIV, cleared as tolerated, dtr present   Precautions/Limitations Fall Precautions;Bed alarm;Chair alarm    Vital Signs and Orthostatic Vital Signs  Vital Signs Free text RA NAD, HR mid 70s throughout    Pain/Comfort  Pain Comment (Pre/Post Treatment Pain) c/o chronic neck discomfort, RN aware    Vision/ Hearing  Hearing difficulties / Use of hearing aids HOH    Balance  Sitting Balance: Static  FAIR+     Maintains static position without assist or device, may require Supervision or Verbal Cues (>2 minutes)   Sitting Balance: Dynamic  FAIR      Performs dynamic activities through 75% range Contact Guard or partial range (50-75%) with Supervision   Standing Balance: Static POOR+    Minimal assist to maintain static position with no Assistive Device   Standing Balance: Dynamic  POOR     Moves through 1/4 to 1/2 ROM range with moderate assist to right self   Balance Assist Device Rolling walker    ADL: Lower Body Dressing  Independence/Assistance Level Total assist/dependent   Clothing Items hospital socks    ADL: Toilet Training  Independence/Assistance Level Total assist/dependent   Hygiene Performance dependent   Toilet Training Comments incontinent stool, dependent hygiene in bed and in standing     AM-PAC - Daily Activity IP Short Form  Help needed from another person putting on/taking off regular lower body clothing 1 - Unable   Help needed from another person for bathing (incl. washing, rinsing, drying) 2 - A Lot   Help needed from another person for toileting (incl. using toilet, bedpan, urinal) 2 - A Lot   Help needed from another person putting on/taking off regular upper body clothing 2 - A Lot   Help needed from another person taking care of personal grooming such as brushing teeth 3 - A Little   Help needed from another person eating meals 4 - None   AM-PAC Daily Activity Raw Score (Total of rows above) 14   CMS Score (based on Raw Score - with G Code) 14 - 59.67% impaired      (G Code - CK)    Bed Mobility  Rolling/Turning Left - Independence/Assistance Level Moderate assist;Assist of 2   Rolling/Turning Assist Device Bed rails   Supine-to-Sit Independence/Assistance Level Moderate assist;Assist of 2   Supine-to-Sit Assist Device Bed rails   Sit-to-Supine Independence/Assistance Level Moderate assist;Assist of 2   Sit-to-Supine Assist Device No device    Sit-Stand Transfer Training  Sit-to-Stand Transfer Independence/Assistance Level Moderate assist;Assist of 2   Sit-to-Stand Transfer Assist Device Rolling walker   Stand-to-Sit Transfer Independence/Assistance Level Minimum assist;Assist of 2   Stand-to-Sit Transfer Assist Device Rolling walker   Sit-Stand Transfer Comments elevated bed   Stand-Sit Transfer Comments cues hand placement    Functional Ambulation  Independence/Assistance Level  Moderate assist;Assist of 2   Assistive Device  Rolling walker   Gait Distance sidesteps   Functional Ambulation Comments fatigued quickly req seated break    Handoff Documentation  Handoff Patient in bed;Bed alarm;Patient instructed to call nursing for mobility;Discussed with  nursing    Therapeutic Exercise  Pulmonary Treatment deep breathing;energy conservation    Clinical Impression  Follow up Assessment cont poc, AX2 ADLs and transfers, limited by weakness and deconditioning, would benefit from moderate complexity support upon d/c    Patient/Family Stated Goals  Patient/Family Stated Goal(s) feel better    Frequency/Equipment Recommendations  OT Frequency 3x per week   Next Treatment Expected 12/23/22   OT/OTA completing this assessment Arlys John    OT Recommendations for Inpatient Admission  ADL Recommendations assist of 2;with rolling walker;bedside commode    Planned Treatment / Interventions  Education Treatment / Interventions Patient Education / Training    OT Discharge Summary  Disposition Recommendation Moderate complexity support and therapy to progress functional mobility/ ADLs/ IADLs recommended for post- acute care.  See assessment for additional details.     Plan of Care Overview/ Patient Status

## 2022-12-21 NOTE — Plan of Care
 Problem: Adult Inpatient Plan of CareGoal: Readiness for Transition of CareOutcome: Outcome(s) achieved Plan of Care Overview/ Patient Status    .Case Management Plan  Flowsheet Row Most Recent Value Discharge Planning  Patient/Patient Representative goals/treatment preferences for discharge are:  ark Patient/Patient Representative was presented with a list of facilities, agencies and/or dme providers and Referral(s) placed for: Short term rehabilitation (at a nursing facility) Facility/Geographical Preference(s) [facility] has been secured and is available on: 12/22/22 Facility [facility] Mode of Transportation  Ambulance (add comment for special considerations) [employer] [facility]/Access CM D/C Readiness  PASRR completed and approved Yes Authorization number obtained, if required Yes Is there a 3 day INPATIENT Qualifying stay for Medicare Patients? N/A DME Authorized/Delivered N/A No needs identified/ follow up with PCP/MD N/A Post acute care services secured W10 complete Yes Pri Completed and Accepted  N/A Is the destination address correct on the W10 Yes Finalized Plan  Expected Discharge Date 12/22/22 Discharge Disposition Skilled Nursing Facility   There is a bed tomorrow at [facility]. Our dept has obtained auth. The pt will be transported by ambulance. The pt and daughter agree with the dc plan.Contact informationDEBBIE ,RN  BSN  ACM-RN CASE MANAGER

## 2022-12-21 NOTE — Plan of Care
 Inpatient Physical Therapy Progress Note IP Adult PT Eval/Treat - 12/21/22 1002    Date of Visit / Treatment  Date of Visit / Treatment 12/21/22   Note Type Progress Note   Progress Report Due 01/02/23   Start Time 9:30a   End Time 10   Total Treatment Time 30 mins    General Information  Subjective Pt agreeable to mobility   General Observations supine in bed on tele, PIV intact   Precautions/Limitations Fall Precautions;Bed alarm    Vital Signs and Orthostatic Vital Signs  Vital Signs Free text on RA, NAD; HR 74 - 78    Pain/Comfort  Pain Comment (Pre/Post Treatment Pain) reports continuous chronic neck soreness, B plantar foot pain during standing- RN aware    Cognition  Following Commands Follows one step commands without difficulty    Vision/ Hearing  Hearing difficulties / Use of hearing aids [facility]    Skin Assessment  Skin Assessment See Nursing Documentation    Balance  Sitting Balance: Static  FAIR+     Maintains static position without assist or device, may require Supervision or Verbal Cues (>2 minutes)   Sitting Balance: Dynamic  FAIR      Performs dynamic activities through 75% range Contact Guard or partial range (50-75%) with Supervision   Standing Balance: Static POOR+    Minimal assist to maintain static position with no Assistive Device   Standing Balance: Dynamic  POOR+   Moves through 1/2 range with minimal assist to right self   Balance Assist Device Rolling walker   Balance Skills Training Comment (A) x2    Bed Mobility  Rolling/Turning Left - Independence/Assistance Level Moderate assist;Assist of 1 person + 1 person to manage equipment;Verbal cues;Nonverbal cues   Rolling/Turning Assist Device Bed rails;Draw pad   Supine-to-Sit Independence/Assistance Level Moderate assist;Assist of 2;Verbal cues   Supine-to-Sit Assist Device Bed rails;Head of bed elevated;Hand held assist   Sit-to-Supine Independence/Assistance Level Moderate assist;Assist of 2;Verbal cues   Sit-to-Supine Assist Device No device    Sit-Stand Transfer Training  Sit-to-Stand Transfer Independence/Assistance Level Moderate assist;Assist of 2;Verbal cues   Sit-to-Stand Transfer Assist Device Rolling walker   elevated bed  Stand-to-Sit Transfer Independence/Assistance Level Minimum assist;Moderate assist;Assist of 2;Verbal cues   Stand-to-Sit Transfer Assist Device Rolling walker   Transfer Safety Analysis Concerns cues for hand placement;decreased weight-shifting ability   Stand-Sit Transfer Comments dec eccentric control    Gait Training  Independence/Assistance Level  Minimum assist;Moderate assist;Assist of 2;Verbal cues   Assistive Device  Rolling walker   Gait Distance sidesteps   2 feet forward & backward  Gait Analysis Deviations decreased cadence;increased time in double stance;decreased step length;decreased weight-shifting ability;decreased toe-to-floor clearance   flexed trunk  Gait Training Comments 2 trials    Handoff Documentation  Handoff Patient in bed;Bed alarm;Patient instructed to call nursing for mobility;Discussed with nursing    PT- AM-PAC - Basic Mobility Screen- How much help from another person do you currently need.....  Turning from your back to your side while in a a flat bed without using rails? 2 - A Lot - Requires a lot of help (maximum to moderate assistance). Can use assistive devices.   Moving from lying on your back to sitting on the side of a flat bed without using bed rails? 2 - A Lot - Requires a lot of help (maximum to moderate assistance). Can use assistive devices.   Moving to and from a bed to a chair (including a wheelchair)? 2 -  A Lot - Requires a lot of help (maximum to moderate assistance). Can use assistive devices.   Standing up from a chair using your arms(e.g., wheelchair or bedside chair)? 2 - A Lot - Requires a lot of help (maximum to moderate assistance). Can use assistive devices.   To walk in a hospital room? 2 - A Lot - Requires a lot of help (maximum to moderate assistance). Can use assistive devices.   Climbing 3-5 steps with a railing? 1 - Total - Requires total assistance or cannot do it at all.   AMPAC Mobility Score 11   TARGET Highest Level of Mobility Mobility Level 4, Transfer to chair   ACTUAL Highest Level of Mobility Mobility Level 6, Walk 10+ steps    Therapeutic Exercise  Therapeutic Exercise Comments standing BLE marching   Therapeutic Exercise Detailed Documentation Lower Extremity Exercises;General Therapeutic Exercises    Therapeutic Exercise  Pulmonary Treatment deep breathing    Lower Extremity Exercises  Supine Exercises Ankle pumps;Bilateral;AROM    Clinical Impression  Follow up Assessment pt tolerated PT treatment fairly well, mobilizing with RW & assist x2; limited by dec strength, balance, endurance / activity tolerance; cont to rec moderate complexity support upon D/C from [facility]   Criteria for Skilled Therapeutic Interventions Met yes;treatment indicated   Rehab Potential good, to achieve stated therapy goals    Frequency/Equipment Recommendations  PT Frequency 3x per week   Next Treatment Expected 12/22/22   PT/PTA completing this assessment Alan Ripper   Equipment Needs During Admission/Treatment Rolling walker    PT Recommendations for Inpatient Admission  Activity/Level of Assist transfers only;assist of 2;with rolling walker    Planned Treatment / Interventions  Education Treatment / Interventions Patient Education / Training   POC, HEP, mob techniques, role of PT   PT Discharge Summary  Physical Therapy Disposition Recommendation Moderate complexity support and therapy to progress functional mobility/ ADLs/ IADLs recommended for post- acute care.  See assessment for additional details.     Belva Crome, DPT, CCS   If NO therapist assigned in treatment team, please contact Rehab Dynamic Role for pt's assigned bed location for assistance.

## 2022-12-21 NOTE — Plan of Care
 Plan of Care Overview/ Patient Status    7PM - 7AM 	Assumed care of patient at [facility]. Patient is A+O x2/x3, disoriented to place/situation. Patient is on tele, tracing SR in the 70s. Lung sounds diminished bilaterally, O2 sat 97% on RA. Foley catheter in place for urinary retention, draining clear yellow urine. Bladder scan at 5:40AM = 0mL. Patient uses bedpan for BM. Patient has blanchable redness to coccyx. Patient refused AM heparin injection 5,000 units, Dr. Corene Cornea notified. No complaints of pain. Patient's daughter at bedside overnight. T+P Q2hrs with pillows. Heels offloaded with pillows. Safety precautions maintained, call bell within reach, bed in lowest position. Electronically Signed by Lissa Hoard, RN, December 20, 2022 6:06 AM  Problem: Adult Inpatient Plan of CareGoal: Plan of Care ReviewOutcome: Interventions implemented as appropriateFlowsheets (Taken 12/20/2022 2334)Progress: no changePlan of Care Reviewed With: patient

## 2022-12-21 NOTE — Plan of Care
 Plan of Care Overview/ Patient Status    Patient A/ox2-3; confused at times. Patient 1-2 assist with ADL's and bed mobility; up with PT with RW. Patient resting in bed most of day; some complaints of pain; tylenol given with some effect. Daughter slept overnight last night and may sleep tonight in recliner ; okayed per Ardyth Harps. Fall prevention emphasized; safety maintained; Will continue to monitor. Okay to not have IV per Dr. Chales Abrahams.

## 2022-12-21 NOTE — Care Coordination-Inpatient
 INSURANCE AUTH OBTAINED FOR STR 12/21/22 1204 Authorization Information Date Authorization Initiated:  12/21/22 Time Authorization Initiated: 1204 Mode Clinical was sent: Portal Facility Name [employer] Facility Authorization yes Insurance Company [facility] Medicare Insurance Co. Solicitor Name/# Home and [employer] Authorization #/Details [id number], 3 Days, 10/23-10/25 Date Authorization recieved 12/21/22 Time Authorization recieved 1205 Follow up contact necessary Yes Contact Name Home and Community Contact phone: [phone] [facility] NilanTransition CoordinatorCare Management DepartmentYale-[facility] [place], [place] Phone: [phone]

## 2022-12-21 NOTE — Progress Notes
 [facility]    Medicine Progress NoteAttending Provider: Georgiann [name], MDSubjective: Reason for Admission:  Mechanical fall with Aki on CKD. Interim History:  Patient seen and examined by me today. Did not have episodes of agitation overnight.Review of Meds: Scheduled:Current Facility-Administered Medications Medication Dose Route Frequency Provider Last Rate Last Admin  carvediloL (COREG) Immediate Release tablet 12.5 mg  12.5 mg Oral BID WC Mark Rakers, MD   12.5 mg at 12/21/22 8469  clopidogreL (PLAVIX) tablet 75 mg  75 mg Oral Daily Mark Rakers, MD   75 mg at 12/21/22 6295  escitalopram oxalate (LEXAPRO) tablet 5 mg  5 mg Oral Daily Mark Rakers, MD   5 mg at 12/21/22 2841  [Held by provider] furosemide (LASIX) tablet 20 mg  20 mg Oral BID Mark Rakers, MD   20 mg at 12/18/22 1145  heparin (PORCINE) injection 5,000 Units  5,000 Units Subcutaneous Q8H Mark Hahn, MD   5,000 Units at 12/20/22 2125  isosorbide mononitrate (IMDUR) 24 hr tablet 30 mg  30 mg Oral Daily Mark Ing, MD   30 mg at 12/21/22 3244  rosuvastatin (CRESTOR) tablet 40 mg  40 mg Oral Daily Mark Rakers, MD   40 mg at 12/21/22 0102  tamsulosin (FLOMAX) 24 hr capsule 0.4 mg  0.4 mg Oral BID Mark Rakers, MD   0.4 mg at 12/21/22 7253  traZODone (DESYREL) tablet 50 mg  50 mg Oral Nightly Mark Hahn, MD   50 mg at 12/20/22 2125 PRN: acetaminophen, emollient combination, ipratropium-albuteroL, magnesium hydroxide, melatonin, ondansetron (PF), ondansetron, phenoL, polyethylene glycol, sennaContinuous Infusions: Objective: Vitals:Temp:  [97.8 ?F (36.6 ?C)-98.4 ?F (36.9 ?C)] 98.2 ?F (36.8 ?C)Pulse:  [69-73] 73Resp:  [17-22] 18BP: (130-143)/(64-72) 143/72SpO2:  [96 %-100 %] 98 %Device (Oxygen Therapy): room airI/O:Gross Totals (Last 24 hours) at 12/21/2022 1040Last data filed at 12/21/2022 0542Intake -- Output 4000 ml Net -4000 ml Physical Exam:	GEN: NADHEENT: MMM, sclera non-icteric, EOMIPULM: CTAB, no wheezing or cracklesCV: RRR, no MRG appreciatedABD: soft, NTND, normal BSEXT: 1+ edema in b/l LESKIN: no rashesNEURO: AAOx3Labs:Recent Labs Lab 10/22/240528 10/22/240809 10/23/240453 WBC 7.3 7.0 7.9 HGB 6.8* 7.1* 7.5* HCT 21.50* 22.30* 23.00* PLT 158 150 177  Recent Labs Lab 10/21/240617 10/22/240528 10/23/240453 NEUTROPHILS 74.8* 67.5 65.0  Recent Labs Lab 10/21/241531 10/22/240527 10/23/240453 NA 137 137 139 K 5.6* 5.4* 5.2 CL 106 107 109* CO2 19* 17* 17* BUN 102* 99* 91* CREATININE 6.40* 5.80* 4.80* GLU 134* 120* 122* ANIONGAP 12 13 13   Recent Labs Lab 10/20/240550 10/20/240855 10/21/241531 10/22/240527 10/23/240453 CALCIUM 8.6*   < > 8.3* 8.3* 8.2* MG 1.6*  --   --   --   --   < > = values in this interval not displayed.  No results for input(s): ALT, AST, ALKPHOS, BILITOT, BILIDIR in the last 168 hours. No results for input(s): PTT, LABPROT, INR in the last 168 hours. FINGERSTICKS: Recent Labs Lab 10/20/240855 10/20/241659 10/21/240617 10/21/241531 10/22/240527 10/23/240453 GLU 134* 137* 139* 134* 120* 122* Micro:Lab Results Component Value Date  LABURIN  12/18/2022   Less than 10,000 CFU/mL. Clinical significance is unlikely for organism(s) present in quantities of less than 10,000 CFU/mL.  LABURIN 50,000-99,000 CFU/mL Enterococcus faecalis (A) 09/14/2022  LABURIN 10,000-49,000 CFU/mL Aerococcus urinae (A) 09/14/2022  LABURIN 50,000-99,000 CFU/mL Enterococcus  faecalis (A) 04/22/2022  LABURIN >=100,000 CFU/mL Aerococcus urinae (A) 04/22/2022 Diagnostics:ECG/TeleEvents:No ECG ordered todayAssessment: 89 year old gentleman with past medical history of  CAD, CKD stage 3, hypertension, hyperlipidemia, left carotid endarterectomy, AAA, urinary retention, lumbar spinal stenosis with neurogenic claudication and aneurysmal disorder admitted with mechanical fall and Aki on CKD. Plan: # AKI on CKD with urinary retention:-likely obstructive AKI. -renal ultrasound showed moderate left and mild right hydronephrosis which has not significantly changed from July 2024.-status post Foley placement.-creatinine decreasing from 6.9-4.8 mg/dL.-trend BMP daily.  -hold losartan and Lasix.-continue with Flomax 0.4 mg b.i.d.-avoid nephrotoxins. # Mechanical fall:-[place] head cervical spine with no evidence of intracranial abnormality or cervical spine fracture or traumatic subluxation.- PT  recommends STR. #CAD, HTN and HLD:-continue with Plavix, Coreg and statin.-continue with Imdur 30 mg daily.-continue to hold losartan and Lasix in the setting of AKI. #AAA- seen by Vascular on 10/2022, found to have 4.5 cm transverse and 4.8 cm AP. -No surgical recommendations a this time. # Spinal stenosis with adjustment disorder:-continue with gabapentin 100 mg b.i.d..-continue with Lexapro 5 mg daily.  # Hospital Issues- F: none- E: replete PRN- N:  Renal diet- GI: bowel regimen- PPx:  Heparin subcutaneous- Code Status:  Full code  # Dispo:  Pending improvementFull CodeMedical decision making for this patient was high.I spent 50 minutes today on this encounter before, during and after the visit, reviewing labs and records, evaluating and examining the patient, entering orders and documenting the visit. Signed:Kady Toothaker Chales [name], MDAssistant Clinical ProfessorDepartment of MedicineYale Sun Behavioral Health hospitalContact via MHBDISCLAIMER:  This note was generated in part using voice recognition software, occasional wrong-word or sound-a-like substitutions may have occurred due to the inherent limitations of voice recognition software. Read the chart carefully and recognize, using context, where the substitutions have occurred. Although every effort was made to edit the content, transcription and typing errors may occur. Please contact me if clarification of content is felt warranted. All due diligence and care undertaken in caring for this patient. Please review chart with your PCP after discharge for important information.

## 2022-12-22 DIAGNOSIS — Z7902 Long term (current) use of antithrombotics/antiplatelets: Secondary | ICD-10-CM

## 2022-12-22 DIAGNOSIS — I2510 Atherosclerotic heart disease of native coronary artery without angina pectoris: Secondary | ICD-10-CM

## 2022-12-22 DIAGNOSIS — Z79899 Other long term (current) drug therapy: Secondary | ICD-10-CM

## 2022-12-22 DIAGNOSIS — M48062 Spinal stenosis, lumbar region with neurogenic claudication: Secondary | ICD-10-CM

## 2022-12-22 DIAGNOSIS — Z87891 Personal history of nicotine dependence: Secondary | ICD-10-CM

## 2022-12-22 DIAGNOSIS — Z888 Allergy status to other drugs, medicaments and biological substances status: Secondary | ICD-10-CM

## 2022-12-22 DIAGNOSIS — D631 Anemia in chronic kidney disease: Secondary | ICD-10-CM

## 2022-12-22 DIAGNOSIS — I129 Hypertensive chronic kidney disease with stage 1 through stage 4 chronic kidney disease, or unspecified chronic kidney disease: Secondary | ICD-10-CM

## 2022-12-22 DIAGNOSIS — Z9181 History of falling: Secondary | ICD-10-CM

## 2022-12-22 DIAGNOSIS — F4320 Adjustment disorder, unspecified: Secondary | ICD-10-CM

## 2022-12-22 DIAGNOSIS — R338 Other retention of urine: Secondary | ICD-10-CM

## 2022-12-22 DIAGNOSIS — D509 Iron deficiency anemia, unspecified: Secondary | ICD-10-CM

## 2022-12-22 DIAGNOSIS — E785 Hyperlipidemia, unspecified: Secondary | ICD-10-CM

## 2022-12-22 DIAGNOSIS — Z7983 Long term (current) use of bisphosphonates: Secondary | ICD-10-CM

## 2022-12-22 DIAGNOSIS — E875 Hyperkalemia: Secondary | ICD-10-CM

## 2022-12-22 DIAGNOSIS — Z781 Physical restraint status: Secondary | ICD-10-CM

## 2022-12-22 DIAGNOSIS — N1330 Unspecified hydronephrosis: Secondary | ICD-10-CM

## 2022-12-22 DIAGNOSIS — I714 Abdominal aortic aneurysm, without rupture, unspecified: Secondary | ICD-10-CM

## 2022-12-22 DIAGNOSIS — N183 Chronic kidney disease, stage 3 unspecified: Secondary | ICD-10-CM

## 2022-12-22 DIAGNOSIS — N138 Other obstructive and reflux uropathy: Secondary | ICD-10-CM

## 2022-12-22 DIAGNOSIS — R451 Restlessness and agitation: Secondary | ICD-10-CM

## 2022-12-22 DIAGNOSIS — N179 Acute kidney failure, unspecified: Secondary | ICD-10-CM

## 2022-12-22 DIAGNOSIS — Z7982 Long term (current) use of aspirin: Secondary | ICD-10-CM

## 2022-12-22 DIAGNOSIS — N401 Enlarged prostate with lower urinary tract symptoms: Secondary | ICD-10-CM

## 2022-12-22 LAB — CBC WITH AUTO DIFFERENTIAL
BKR WAM ABSOLUTE IMMATURE GRANULOCYTES.: 0.03 x 1000/ÂµL (ref 0.00–0.30)
BKR WAM ABSOLUTE LYMPHOCYTE COUNT.: 1.27 x 1000/ÂµL (ref 0.60–3.70)
BKR WAM ABSOLUTE NRBC (2 DEC): 0 x 1000/ÂµL (ref 0.00–1.00)
BKR WAM ANC (ABSOLUTE NEUTROPHIL COUNT): 4.87 x 1000/ÂµL (ref 2.00–7.60)
BKR WAM BASOPHIL ABSOLUTE COUNT.: 0.02 x 1000/ÂµL (ref 0.00–1.00)
BKR WAM BASOPHILS: 0.3 % (ref 0.0–1.4)
BKR WAM EOSINOPHIL ABSOLUTE COUNT.: 0.54 x 1000/ÂµL (ref 0.00–1.00)
BKR WAM EOSINOPHILS: 7.2 % — ABNORMAL HIGH (ref 0.0–5.0)
BKR WAM HEMATOCRIT (2 DEC): 23.9 % — ABNORMAL LOW (ref 38.50–50.00)
BKR WAM HEMOGLOBIN: 7.4 g/dL — ABNORMAL LOW (ref 13.2–17.1)
BKR WAM IMMATURE GRANULOCYTES: 0.4 % (ref 0.0–1.0)
BKR WAM LYMPHOCYTES: 16.9 % — ABNORMAL LOW (ref 17.0–50.0)
BKR WAM MCH (PG): 23 pg — ABNORMAL LOW (ref 27.0–33.0)
BKR WAM MCHC: 31 g/dL (ref 31.0–36.0)
BKR WAM MCV: 74.2 fL — ABNORMAL LOW (ref 80.0–100.0)
BKR WAM MONOCYTE ABSOLUTE COUNT.: 0.77 x 1000/ÂµL (ref 0.00–1.00)
BKR WAM MONOCYTES: 10.3 % (ref 4.0–12.0)
BKR WAM MPV: 9.7 fL (ref 8.0–12.0)
BKR WAM NEUTROPHILS: 64.9 % (ref 39.0–72.0)
BKR WAM NUCLEATED RED BLOOD CELLS: 0 % (ref 0.0–1.0)
BKR WAM PLATELETS: 168 x1000/ÂµL (ref 150–420)
BKR WAM RDW-CV: 15.4 % — ABNORMAL HIGH (ref 11.0–15.0)
BKR WAM RED BLOOD CELL COUNT.: 3.22 M/ÂµL — ABNORMAL LOW (ref 4.00–6.00)
BKR WAM WHITE BLOOD CELL COUNT: 7.5 x1000/ÂµL (ref 4.0–11.0)

## 2022-12-22 LAB — BASIC METABOLIC PANEL
BKR ANION GAP: 13 (ref 7–17)
BKR BLOOD UREA NITROGEN: 80 mg/dL — ABNORMAL HIGH (ref 8–23)
BKR BUN / CREAT RATIO: 19 (ref 8.0–23.0)
BKR CALCIUM: 8.4 mg/dL — ABNORMAL LOW (ref 8.8–10.2)
BKR CHLORIDE: 109 mmol/L — ABNORMAL HIGH (ref 98–107)
BKR CO2: 16 mmol/L — ABNORMAL LOW (ref 20–30)
BKR CREATININE: 4.2 mg/dL — ABNORMAL HIGH (ref 0.40–1.30)
BKR EGFR, CREATININE (CKD-EPI 2021): 12 mL/min/{1.73_m2} — ABNORMAL LOW (ref >=60)
BKR GLUCOSE: 150 mg/dL — ABNORMAL HIGH (ref 70–100)
BKR POTASSIUM: 4.9 mmol/L (ref 3.3–5.3)
BKR SODIUM: 138 mmol/L (ref 136–144)

## 2022-12-22 MED ORDER — POLYETHYLENE GLYCOL 3350 17 GRAM ORAL POWDER PACKET
17 | Freq: Two times a day (BID) | ORAL | 3 refills | Status: AC | PRN
Start: 2022-12-22 — End: ?

## 2022-12-22 NOTE — Plan of Care
 Plan of Care Overview/ Patient Status    7PM - 7AM 	Assumed care of patient at [facility]. Patient is A+O x2/x3, disoriented to situation/place. Patient is on tele, tracing SR in the 70s-80s. Lung sounds diminished bilaterally, O2 sat 98% on RA. Foley catheter in place for urinary retention, draining clear yellow urine. Bladder scan = 2mL. Patient is incontinent of BM. Patient c/o of itching to back, Lubriderm lotion applied. Patient has blanchable redness to coccyx. No complaints of pain. Bed alarm on. T+P Q2hrs with pillows. Heels offloaded using pillows. Safety precautions maintained, call bell within reach, bed in lowest position. Hourly rounding in progress. 6:04AM - Patient had possible run of Vtach. Patient asymptomatic, resting in bed. Picture sent to Dr. Rulon Abide. No new orders. Electronically Signed by Lissa Hoard, RN, December 21, 2022 6:33 AM  Problem: Adult Inpatient Plan of CareGoal: Plan of Care ReviewOutcome: Interventions implemented as appropriateFlowsheets (Taken 12/21/2022 2151)Progress: no changePlan of Care Reviewed With: patient

## 2022-12-22 NOTE — Other
 Talked to family member by bedside.  Gave a clinical update.  Notified of discharge to rehab facility.  Answered all questions, queries and concerns.

## 2022-12-22 NOTE — Plan of Care
 Problem: Adult Inpatient Plan of CareGoal: Readiness for Transition of CareOutcome: Outcome(s) achieved Plan of Care Overview/ Patient Status    Case Management Plan  Flowsheet Row Most Recent Value Discharge Planning  Patient/Patient Representative goals/treatment preferences for discharge are:  [facility] Patient/Patient Representative was presented with a list of facilities, agencies and/or dme providers and Referral(s) placed for: Short term rehabilitation (at a nursing facility) Facility/Geographical Preference(s) [facility] Bed has been secured and is available on: 12/22/22 Facility Name [facility] Mode of Transportation  Ambulance (add comment for special considerations) [employer] [facility]/Access CM D/C Readiness  PASRR completed and approved Yes Authorization number obtained, if required Yes Is there a 3 day INPATIENT Qualifying stay for Medicare Patients? N/A Medicare IM- signed, dated, timed and scanned, if required Yes DME Authorized/Delivered N/A No needs identified/ follow up with PCP/MD N/A Post acute care services secured W10 complete Yes Pri Completed and Accepted  N/A Is the destination address correct on the W10 Yes Finalized Plan  Expected Discharge Date 12/22/22 Discharge Disposition Skilled Nursing Facility   The pt is medically cleared to be discharged to [facility]. Our dept has obtained authorization. The pt and daughter susan agree with the dc plan.Contact informationDEBBIE ,RN  BSN  ACM-RN CASE MANAGER
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
## 2022-12-22 NOTE — Plan of Care
 Plan of Care Overview/ Patient Status    Mark [name] was discharged via Ambulance accompanied by Alone.  Verbalized understanding of discharge instructionsand recommended follow up care as per the after visit summary.  Written discharge instructions provided. Denies any further questions. Vital signs    Vitals:  12/21/22 1228 12/21/22 1717 12/21/22 2101 12/22/22 0619 BP: 127/70 122/62 (!) 127/57 138/65 Pulse: 72 67 69 69 Resp: 20 18 18 16  Temp: 98.8 ?F (37.1 ?C) 98.3 ?F (36.8 ?C) 98.4 ?F (36.9 ?C) 98.3 ?F (36.8 ?C) TempSrc:  Oral Oral Oral SpO2: 97% 97% 98% 94% Patient confirmed all belongings returned. Belongings charted in last 7 days: Patient Valuables   Patient Valuables Flowsheet                    PATIENT VALUABLE(S)       Cell phone disposition At bedside/locker/closet 12/19/22 1019     A&Ox3, off to place. VSS on RA, no pain indicated. Patient somewhat anxious.Regular diet, pills whole.Mixed continence, foley in place, LBM 10/23.Assist x2 in bed. PT recommends transfer only w/ RW and assist x2.Blancheable redness to coccyx.Safety checks and rounding performed.10:15 Report given to [facility], STAT call booked. 10:41

## 2023-01-16 ENCOUNTER — Inpatient Hospital Stay: Admit: 2023-01-16 | Discharge: 2023-01-16 | Payer: PRIVATE HEALTH INSURANCE | Attending: Emergency Medicine

## 2023-01-16 LAB — BASIC METABOLIC PANEL
BKR ANION GAP: 10 (ref 7–17)
BKR BLOOD UREA NITROGEN: 53 mg/dL — ABNORMAL HIGH (ref 8–23)
BKR BUN / CREAT RATIO: 18.3 (ref 8.0–23.0)
BKR CALCIUM: 9.1 mg/dL (ref 8.8–10.2)
BKR CHLORIDE: 102 mmol/L (ref 98–107)
BKR CO2: 18 mmol/L — ABNORMAL LOW (ref 20–30)
BKR CREATININE: 2.9 mg/dL — ABNORMAL HIGH (ref 0.40–1.30)
BKR EGFR, CREATININE (CKD-EPI 2021): 19 mL/min/{1.73_m2} — ABNORMAL LOW (ref >=60)
BKR GLUCOSE: 111 mg/dL — ABNORMAL HIGH (ref 70–100)
BKR POTASSIUM: 4.5 mmol/L (ref 3.3–5.3)
BKR SODIUM: 130 mmol/L — ABNORMAL LOW (ref 136–144)

## 2023-01-16 LAB — CBC WITH AUTO DIFFERENTIAL
BKR WAM ABSOLUTE IMMATURE GRANULOCYTES.: 0.03 x 1000/ÂµL (ref 0.00–0.30)
BKR WAM ABSOLUTE LYMPHOCYTE COUNT.: 1.31 x 1000/ÂµL (ref 0.60–3.70)
BKR WAM ABSOLUTE NRBC (2 DEC): 0 x 1000/ÂµL (ref 0.00–1.00)
BKR WAM ANC (ABSOLUTE NEUTROPHIL COUNT): 3.66 x 1000/ÂµL (ref 2.00–7.60)
BKR WAM BASOPHIL ABSOLUTE COUNT.: 0.04 x 1000/ÂµL (ref 0.00–1.00)
BKR WAM BASOPHILS: 0.6 % (ref 0.0–1.4)
BKR WAM EOSINOPHIL ABSOLUTE COUNT.: 0.55 x 1000/ÂµL (ref 0.00–1.00)
BKR WAM EOSINOPHILS: 8.8 % — ABNORMAL HIGH (ref 0.0–5.0)
BKR WAM HEMATOCRIT (2 DEC): 27 % — ABNORMAL LOW (ref 38.50–50.00)
BKR WAM HEMOGLOBIN: 8.7 g/dL — ABNORMAL LOW (ref 13.2–17.1)
BKR WAM IMMATURE GRANULOCYTES: 0.5 % (ref 0.0–1.0)
BKR WAM LYMPHOCYTES: 21 % (ref 17.0–50.0)
BKR WAM MCH (PG): 23.6 pg — ABNORMAL LOW (ref 27.0–33.0)
BKR WAM MCHC: 32.2 g/dL (ref 31.0–36.0)
BKR WAM MCV: 73.4 fL — ABNORMAL LOW (ref 80.0–100.0)
BKR WAM MONOCYTE ABSOLUTE COUNT.: 0.66 x 1000/ÂµL (ref 0.00–1.00)
BKR WAM MONOCYTES: 10.6 % (ref 4.0–12.0)
BKR WAM MPV: 9.7 fL (ref 8.0–12.0)
BKR WAM NEUTROPHILS: 58.5 % (ref 39.0–72.0)
BKR WAM NUCLEATED RED BLOOD CELLS: 0 % (ref 0.0–1.0)
BKR WAM PLATELETS: 163 x1000/ÂµL (ref 150–420)
BKR WAM RDW-CV: 16 % — ABNORMAL HIGH (ref 11.0–15.0)
BKR WAM RED BLOOD CELL COUNT.: 3.68 M/ÂµL — ABNORMAL LOW (ref 4.00–6.00)
BKR WAM WHITE BLOOD CELL COUNT: 6.3 x1000/ÂµL (ref 4.0–11.0)

## 2023-01-16 MED ORDER — SODIUM BICARBONATE 650 MG TABLET
650 mg | Freq: Once | ORAL | Status: CP
Start: 2023-01-16 — End: ?
  Administered 2023-01-17: 650 mg via ORAL

## 2023-01-16 MED ORDER — ORAL/ENTERAL REHYDRATION FLUIDS (ELECTROLYTE DRINK/WATER)
ORAL | Status: DC
Start: 2023-01-16 — End: 2023-01-17

## 2023-01-17 NOTE — ED Provider Notes
 Chief Complaint Patient presents with  Hypotension   89 year old male from home, sat up to work with PT, became light headed when he sat up. Was hypotensive. When he laid down he felt better. Light headedness has resolved. Has chronic neck pain. Denies any new pain now.  HPI/PE:C1H89 yo with HTN, CAD, HLD recent return to home and not ambulatory yet, but getting rehab at home, today sat up and felt lightheaded and supposedly BP low, EMS found patient asymptomatic and nml BP, patient states did not want to come to ED and feels fine, no HA/CP/SOB/abd pain, no F/C and asking for food, afebrile, MAP 75, lungs CTA, abd soft NT, moves all ext, speech clear and pleasantly conversant and lucid with me, check labs r/o anemia, AKI, electrolyte shifts, hypoglycemia.  Linus Orn, DO   Physical ExamED Triage Vitals [01/16/23 1756]BP: (!) 109/58Pulse: 65Pulse from  O2 sat: n/aResp: 17Temp: 98 ?F (36.7 ?C)Temp src: OralSpO2: 95 % BP 118/66  - Pulse (!) 99  - Temp 97.8 ?F (36.6 ?C) (Oral)  - Resp 15  - SpO2 98% Physical Exam ProceduresAttestation/Critical CarePatient Reevaluation: I reviewed the labs and mild hyponatremia, base CKD, no leukocytosis, base anemiaPatient eating sandwich well, taking PO fluids well and asymptomaticAn acute or life threatening problem was considered during this evaluation:A decision regarding hospitalization was made during this visit.Patient does not require admission or further ED Observation at this time.Decision made to dischargeVinu Greogry Goodwyn, DO Clinical Impressions as of 01/16/23 1913 Hyponatremia  ED DispositionDischarge Linus Orn, DO11/18/24 1820 Linus Orn, DO11/18/24 1913

## 2023-01-17 NOTE — Discharge Instructions
 You will need repeat labs to check your sodium level.  Please return if unable to take liquids by mouth or light-headed.

## 2023-01-17 NOTE — ED Notes
 7:01 PM Chief Complaint Patient presents with  Hypotension   89 year old male from home, sat up to work with PT, became light headed when he sat up. Was hypotensive. When he laid down he felt better. Light headedness has resolved. Has chronic neck pain. Denies any new pain now.  Pt BIBA from home for hypotension. Visiting home PT was working w pt when he became hypotensive, pt reports few seconds of feeling light headed during this time with quick resolve. Pt normotensive in ambulance and upon arrival to ED. Pt AA&Ox4, speech clear and logical. Pt remains normotensive throughout this stay. Food and fluids provided. Provider at bedside for eval. Food and fluids provided.

## 2023-01-17 NOTE — ED Notes
 7:01 PM Per MD [place] sit Pt up on side of bed for dizziness trial.

## 2023-01-17 NOTE — ED Notes
 7:11 PM RN received report for ptChief Complaint Patient presents with  Hypotension   89 year old male from home, sat up to work with PT, became light headed when he sat up. Was hypotensive. When he laid down he felt better. Light headedness has resolved. Has chronic neck pain. Denies any new pain now.  Past Medical History: Diagnosis Date  Chronic coronary artery disease   Hyperlipidemia   Hypertension  7:31 PMRN set up transport for pt. .0721Pt denies all complaints at this time2116Pt stable and in no distress at the time of dc

## 2023-01-19 ENCOUNTER — Ambulatory Visit: Admit: 2023-01-19 | Payer: PRIVATE HEALTH INSURANCE | Attending: Cardiovascular Disease | Primary: Internal Medicine

## 2023-01-31 ENCOUNTER — Encounter: Admit: 2023-01-31 | Payer: PRIVATE HEALTH INSURANCE | Attending: Cardiovascular Disease | Primary: Internal Medicine

## 2023-02-01 MED ORDER — CARVEDILOL IMMEDIATE RELEASE 12.5 MG TABLET
12.5 | ORAL_TABLET | 4 refills | Status: AC
Start: 2023-02-01 — End: ?

## 2023-02-24 ENCOUNTER — Inpatient Hospital Stay
Admit: 2023-02-24 | Discharge: 2023-03-06 | Payer: PRIVATE HEALTH INSURANCE | Attending: Student in an Organized Health Care Education/Training Program | Admitting: Internal Medicine | Primary: Internal Medicine

## 2023-02-24 ENCOUNTER — Emergency Department: Admit: 2023-02-24 | Payer: PRIVATE HEALTH INSURANCE | Primary: Internal Medicine

## 2023-02-24 MED ORDER — CARVEDILOL IMMEDIATE RELEASE 12.5 MG TABLET
12.5 mg | Freq: Two times a day (BID) | ORAL | Status: CP
Start: 2023-02-24 — End: ?

## 2023-02-24 MED ORDER — ESCITALOPRAM 5 MG TABLET
5 mg | Freq: Every day | ORAL | Status: CP
Start: 2023-02-24 — End: ?
  Administered 2023-02-25 – 2023-03-06 (×10): 5 mg via ORAL

## 2023-02-24 MED ORDER — SODIUM CHLORIDE 0.9 % (FLUSH) INJECTION SYRINGE
0.9 % | Freq: Three times a day (TID) | INTRAVENOUS | Status: AC
Start: 2023-02-24 — End: ?
  Administered 2023-02-25 – 2023-03-06 (×21): 0.9 mL via INTRAVENOUS

## 2023-02-24 MED ORDER — FUROSEMIDE 20 MG TABLET
20 mg | Freq: Every day | ORAL | Status: DC
Start: 2023-02-24 — End: 2023-02-25

## 2023-02-24 MED ORDER — IPRATROPIUM BROMIDE 0.02 % SOLUTION FOR INHALATION
0.02 % | Freq: Once | RESPIRATORY_TRACT | Status: CP
Start: 2023-02-24 — End: ?
  Administered 2023-02-25: 04:00:00 0.02 mL via RESPIRATORY_TRACT

## 2023-02-24 MED ORDER — SODIUM CHLORIDE 0.9 % (FLUSH) INJECTION SYRINGE
0.9 % | Freq: Once | INTRAVENOUS | Status: CP
Start: 2023-02-24 — End: ?
  Administered 2023-02-25: 05:00:00 0.9 mL via INTRAVENOUS

## 2023-02-24 MED ORDER — SODIUM CHLORIDE 0.9 % (FLUSH) INJECTION SYRINGE
0.9 % | INTRAVENOUS | Status: AC | PRN
Start: 2023-02-24 — End: ?

## 2023-02-24 MED ORDER — LOSARTAN 25 MG TABLET
25 mg | Freq: Every day | ORAL | Status: DC
Start: 2023-02-24 — End: 2023-02-27

## 2023-02-24 MED ORDER — SODIUM ZIRCONIUM CYCLOSILICATE 10 GRAM ORAL POWDER PACKET
10 gram | Freq: Once | ORAL | Status: CP
Start: 2023-02-24 — End: ?
  Administered 2023-02-25: 05:00:00 10 gram via ORAL

## 2023-02-24 MED ORDER — ISOSORBIDE MONONITRATE ER 30 MG TABLET,EXTENDED RELEASE 24 HR
30 mg | Freq: Every day | ORAL | Status: CP
Start: 2023-02-24 — End: ?

## 2023-02-24 MED ORDER — TAMSULOSIN 0.4 MG CAPSULE
0.4 mg | Freq: Two times a day (BID) | ORAL | Status: CP
Start: 2023-02-24 — End: ?
  Administered 2023-02-25 – 2023-03-02 (×11): 0.4 mg via ORAL

## 2023-02-24 MED ORDER — GABAPENTIN 100 MG CAPSULE
100 mg | Freq: Every evening | ORAL | Status: CP
Start: 2023-02-24 — End: ?
  Administered 2023-02-26 – 2023-03-06 (×9): 100 mg via ORAL

## 2023-02-24 MED ORDER — ASPIRIN 81 MG TABLET,DELAYED RELEASE
81 mg | Freq: Every day | ORAL | Status: CP
Start: 2023-02-24 — End: ?
  Administered 2023-02-25 – 2023-03-06 (×10): 81 mg via ORAL

## 2023-02-24 MED ORDER — HEPARIN (PORCINE) 5,000 UNIT/ML INJECTION SOLUTION
5000 unit/mL | Freq: Three times a day (TID) | SUBCUTANEOUS | Status: DC
Start: 2023-02-24 — End: 2023-02-27
  Administered 2023-02-25 – 2023-02-27 (×6): 5000 mL via SUBCUTANEOUS

## 2023-02-24 MED ORDER — POLYETHYLENE GLYCOL 3350 17 GRAM ORAL POWDER PACKET
17 gram | Freq: Two times a day (BID) | ORAL | Status: AC | PRN
Start: 2023-02-24 — End: ?

## 2023-02-24 MED ORDER — SENNOSIDES 8.6 MG TABLET
8.6 mg | Freq: Every evening | ORAL | Status: AC | PRN
Start: 2023-02-24 — End: ?
  Administered 2023-03-04: 02:00:00 8.6 mg via ORAL

## 2023-02-24 MED ORDER — FUROSEMIDE 20 MG TABLET
20 mg | Freq: Every day | ORAL | Status: SS
Start: 2023-02-24 — End: ?

## 2023-02-24 MED ORDER — FUROSEMIDE 20 MG TABLET
20 mg | Freq: Every day | ORAL | Status: CP
Start: 2023-02-24 — End: ?
  Administered 2023-02-25: 05:00:00 20 mg via ORAL

## 2023-02-24 MED ORDER — NITROGLYCERIN 0.4 MG SUBLINGUAL TABLET
0.4 mg | SUBLINGUAL | Status: AC | PRN
Start: 2023-02-24 — End: ?

## 2023-02-24 MED ORDER — ZINC OXIDE-COD LIVER OIL 40 % TOPICAL PASTE
40 % | Freq: Two times a day (BID) | TOPICAL | Status: DC
Start: 2023-02-24 — End: 2023-02-28
  Administered 2023-02-25 – 2023-02-28 (×7): 40 % via TOPICAL

## 2023-02-24 MED ORDER — CLOPIDOGREL 75 MG TABLET
75 mg | Freq: Every day | ORAL | Status: CP
Start: 2023-02-24 — End: ?
  Administered 2023-02-25 – 2023-03-06 (×10): 75 mg via ORAL

## 2023-02-24 MED ORDER — DEXTROSE 10 % IV BOLUS FOR ORDERABLE
INTRAVENOUS | Status: AC | PRN
Start: 2023-02-24 — End: ?

## 2023-02-24 MED ORDER — INSULIN U-100 REGULAR HUMAN 100 UNIT/ML INJECTION SOLUTION
100 unit/mL | Freq: Once | INTRAVENOUS | Status: CP
Start: 2023-02-24 — End: ?
  Administered 2023-02-25: 05:00:00 100 unit/mL via INTRAVENOUS

## 2023-02-24 MED ORDER — ROSUVASTATIN 40 MG TABLET
40 mg | Freq: Every day | ORAL | Status: CP
Start: 2023-02-24 — End: ?
  Administered 2023-02-25 – 2023-03-06 (×10): 40 mg via ORAL

## 2023-02-24 MED ORDER — DEXTROSE 10 % IV BOLUS FOR ORDERABLE
Freq: Once | INTRAVENOUS | Status: CP
Start: 2023-02-24 — End: ?
  Administered 2023-02-25: 05:00:00 via INTRAVENOUS

## 2023-02-24 MED ORDER — ACETAMINOPHEN 325 MG TABLET
325 mg | Freq: Four times a day (QID) | ORAL | Status: CP | PRN
Start: 2023-02-24 — End: ?
  Administered 2023-03-01: 13:00:00 325 mg via ORAL

## 2023-02-24 NOTE — ED Notes
 3:19 PM Report received. Pt here for near fall from toilet hight. Was assisted to the floor. No LOC no head strike. Denies CP or dizziness. Recently discharged from STR for fall, family reporting increased weakness requesting placement. Chronic foley in place. Chief Complaint Patient presents with  Fall greater than 89 years old   BIBA from home for near fall from toilet height. Pt was lowered to floor after losing balance getting up from toilet. Denies head strike, CP, SOB, or dizziness. Recently discharged from STR for a fall. Chronic foley changed 3 days ago, now c/o discomfort.  Past Medical History: Diagnosis Date  Chronic coronary artery disease   Hyperlipidemia   Hypertension  4:22 PMVitals obtained. 6:24 PMPt to [place] scan.

## 2023-02-24 NOTE — ED Notes
 1:47 PM Pt BIBA from home s/p near-fall after attempting to use toilet. EMS report pt was attempting to sit on toilet and nearly fell while trying to sit, was then lowered/assisted to floor by daughter. Pt A&Ox4, denies any medical complaints on arrival to ED. EMS report caretaker at home is sick and has been unable to ambulate pt, therefore pt has been bedbound x1 week. Pt arrives to ED with Foley catheter in place. EMS deny fever, cough, or other symptoms at this time.

## 2023-02-25 ENCOUNTER — Encounter: Admit: 2023-02-25 | Payer: PRIVATE HEALTH INSURANCE | Attending: Internal Medicine | Primary: Internal Medicine

## 2023-02-25 DIAGNOSIS — I2510 Atherosclerotic heart disease of native coronary artery without angina pectoris: Secondary | ICD-10-CM

## 2023-02-25 DIAGNOSIS — I10 Essential (primary) hypertension: Secondary | ICD-10-CM

## 2023-02-25 DIAGNOSIS — E785 Hyperlipidemia, unspecified: Secondary | ICD-10-CM

## 2023-02-25 LAB — BASIC METABOLIC PANEL
BKR ANION GAP: 13 (ref 7–17)
BKR ANION GAP: 13 (ref 7–17)
BKR BLOOD UREA NITROGEN: 62 mg/dL — ABNORMAL HIGH (ref 8–23)
BKR BLOOD UREA NITROGEN: 63 mg/dL — ABNORMAL HIGH (ref 8–23)
BKR BUN / CREAT RATIO: 20 (ref 8.0–23.0)
BKR BUN / CREAT RATIO: 19.7 (ref 8.0–23.0)
BKR CALCIUM: 8.6 mg/dL — ABNORMAL LOW (ref 8.8–10.2)
BKR CALCIUM: 8.6 mg/dL — ABNORMAL LOW (ref 8.8–10.2)
BKR CHLORIDE: 104 mmol/L (ref 98–107)
BKR CHLORIDE: 107 mmol/L (ref 98–107)
BKR CO2: 19 mmol/L — ABNORMAL LOW (ref 20–30)
BKR CO2: 19 mmol/L — ABNORMAL LOW (ref 20–30)
BKR CREATININE DELTA: 0.2
BKR CREATININE DELTA: 0.1
BKR CREATININE: 3.1 mg/dL — ABNORMAL HIGH (ref 0.40–1.30)
BKR CREATININE: 3.2 mg/dL — ABNORMAL HIGH (ref 0.40–1.30)
BKR EGFR, CREATININE (CKD-EPI 2021): 18 mL/min/{1.73_m2} — ABNORMAL LOW (ref >=60)
BKR EGFR, CREATININE (CKD-EPI 2021): 17 mL/min/{1.73_m2} — ABNORMAL LOW (ref >=60)
BKR GLUCOSE: 124 mg/dL — ABNORMAL HIGH (ref 70–100)
BKR GLUCOSE: 93 mg/dL (ref 70–100)
BKR POTASSIUM: 6 mmol/L — CR (ref 3.3–5.3)
BKR POTASSIUM: 4.5 mmol/L (ref 3.3–5.3)
BKR SODIUM: 136 mmol/L (ref 136–144)
BKR SODIUM: 139 mmol/L (ref 136–144)

## 2023-02-25 LAB — CBC WITH AUTO DIFFERENTIAL
BKR WAM ABSOLUTE IMMATURE GRANULOCYTES.: 0.02 x 1000/ÂµL (ref 0.00–0.30)
BKR WAM ABSOLUTE IMMATURE GRANULOCYTES.: 0.01 x 1000/ÂµL (ref 0.00–0.30)
BKR WAM ABSOLUTE LYMPHOCYTE COUNT.: 1.08 x 1000/ÂµL (ref 0.60–3.70)
BKR WAM ABSOLUTE LYMPHOCYTE COUNT.: 1.39 x 1000/ÂµL (ref 0.60–3.70)
BKR WAM ABSOLUTE NRBC (2 DEC): 0 x 1000/ÂµL (ref 0.00–1.00)
BKR WAM ABSOLUTE NRBC (2 DEC): 0 x 1000/ÂµL (ref 0.00–1.00)
BKR WAM ANC (ABSOLUTE NEUTROPHIL COUNT): 3.45 x 1000/ÂµL (ref 2.00–7.60)
BKR WAM ANC (ABSOLUTE NEUTROPHIL COUNT): 3.3 x 1000/ÂµL (ref 2.00–7.60)
BKR WAM BASOPHIL ABSOLUTE COUNT.: 0.04 x 1000/ÂµL (ref 0.00–1.00)
BKR WAM BASOPHIL ABSOLUTE COUNT.: 0.04 x 1000/ÂµL (ref 0.00–1.00)
BKR WAM BASOPHILS: 0.7 % (ref 0.0–1.4)
BKR WAM BASOPHILS: 0.7 % (ref 0.0–1.4)
BKR WAM EOSINOPHIL ABSOLUTE COUNT.: 0.54 x 1000/ÂµL (ref 0.00–1.00)
BKR WAM EOSINOPHIL ABSOLUTE COUNT.: 0.66 x 1000/ÂµL (ref 0.00–1.00)
BKR WAM EOSINOPHILS: 9.6 % — ABNORMAL HIGH (ref 0.0–5.0)
BKR WAM EOSINOPHILS: 11 % — ABNORMAL HIGH (ref 0.0–5.0)
BKR WAM HEMATOCRIT (2 DEC): 28.3 % — ABNORMAL LOW (ref 38.50–50.00)
BKR WAM HEMATOCRIT (2 DEC): 26.5 % — ABNORMAL LOW (ref 38.50–50.00)
BKR WAM HEMOGLOBIN: 8.8 g/dL — ABNORMAL LOW (ref 13.2–17.1)
BKR WAM HEMOGLOBIN: 8.2 g/dL — ABNORMAL LOW (ref 13.2–17.1)
BKR WAM IMMATURE GRANULOCYTES: 0.4 % (ref 0.0–1.0)
BKR WAM IMMATURE GRANULOCYTES: 0.2 % (ref 0.0–1.0)
BKR WAM LYMPHOCYTES: 19.1 % (ref 17.0–50.0)
BKR WAM LYMPHOCYTES: 23.1 % (ref 17.0–50.0)
BKR WAM MCH (PG): 23.4 pg — ABNORMAL LOW (ref 27.0–33.0)
BKR WAM MCH (PG): 23.3 pg — ABNORMAL LOW (ref 27.0–33.0)
BKR WAM MCHC: 31.1 g/dL (ref 31.0–36.0)
BKR WAM MCHC: 30.9 g/dL — ABNORMAL LOW (ref 31.0–36.0)
BKR WAM MCV: 75.3 fL — ABNORMAL LOW (ref 80.0–100.0)
BKR WAM MCV: 75.3 fL — ABNORMAL LOW (ref 80.0–100.0)
BKR WAM MONOCYTE ABSOLUTE COUNT.: 0.51 x 1000/ÂµL (ref 0.00–1.00)
BKR WAM MONOCYTE ABSOLUTE COUNT.: 0.62 x 1000/ÂµL (ref 0.00–1.00)
BKR WAM MONOCYTES: 9 % (ref 4.0–12.0)
BKR WAM MONOCYTES: 10.3 % (ref 4.0–12.0)
BKR WAM MPV: 10.4 fL (ref 8.0–12.0)
BKR WAM MPV: 10.6 fL (ref 8.0–12.0)
BKR WAM NEUTROPHILS: 61.2 % (ref 39.0–72.0)
BKR WAM NEUTROPHILS: 54.7 % (ref 39.0–72.0)
BKR WAM NUCLEATED RED BLOOD CELLS: 0 % (ref 0.0–1.0)
BKR WAM NUCLEATED RED BLOOD CELLS: 0 % (ref 0.0–1.0)
BKR WAM PLATELETS: 149 x1000/ÂµL — ABNORMAL LOW (ref 150–420)
BKR WAM PLATELETS: 136 x1000/ÂµL — ABNORMAL LOW (ref 150–420)
BKR WAM RDW-CV: 16.9 % — ABNORMAL HIGH (ref 11.0–15.0)
BKR WAM RDW-CV: 16.6 % — ABNORMAL HIGH (ref 11.0–15.0)
BKR WAM RED BLOOD CELL COUNT.: 3.76 M/ÂµL — ABNORMAL LOW (ref 4.00–6.00)
BKR WAM RED BLOOD CELL COUNT.: 3.52 M/ÂµL — ABNORMAL LOW (ref 4.00–6.00)
BKR WAM WHITE BLOOD CELL COUNT: 5.6 x1000/ÂµL (ref 4.0–11.0)
BKR WAM WHITE BLOOD CELL COUNT: 6 x1000/ÂµL (ref 4.0–11.0)

## 2023-02-25 LAB — SARS-COV-2 (COVID-19)/INFLUENZA A+B/RSV BY RT-PCR (BH GH LMW YH)
BKR INFLUENZA A: NEGATIVE
BKR INFLUENZA B: NEGATIVE
BKR RESPIRATORY SYNCYTIAL VIRUS: NEGATIVE
BKR SARS-COV-2 RNA (COVID-19) (YH): NEGATIVE

## 2023-02-25 LAB — PHOSPHORUS     (BH GH L LMW YH): BKR PHOSPHORUS: 3.2 mg/dL (ref 2.2–4.5)

## 2023-02-25 MED ORDER — SODIUM CHLORIDE 0.9 % INTRAVENOUS SOLUTION
INTRAVENOUS | Status: DC
Start: 2023-02-25 — End: 2023-02-25

## 2023-02-25 MED ORDER — SODIUM CHLORIDE 0.9 % INTRAVENOUS SOLUTION
INTRAVENOUS | Status: DC
Start: 2023-02-25 — End: 2023-02-26
  Administered 2023-02-25: 19:00:00 via INTRAVENOUS

## 2023-02-25 MED ORDER — SODIUM BICARBONATE 650 MG TABLET
650 | Freq: Three times a day (TID) | ORAL | Status: CP
Start: 2023-02-25 — End: ?
  Administered 2023-02-25 – 2023-03-06 (×28): 650 mg via ORAL

## 2023-02-25 NOTE — Utilization Review (ED)
 Utilization Review from XsolisPatient Data:  Patient Name: Mark [name] Age: 89 y.o. DOB: Apr 16, 1925	 MRN: [id number]	 Indicated Status: Observation Review Comments: Booked to observation s/p fall, no injury on imaging, PT/OT eval ordered.  Labs pending.

## 2023-02-25 NOTE — Utilization Review (ED)
 Utilization Review from XsolisPatient Data:  Patient Name: Mark [name] Age: 89 y.o. DOB: Nov 28, 1925	 MRN: [id number]	 Indicated Status: Inpatient Review Comments: near syncope, tele, orthostatics, PT eval. hyperkalemia, Insulin, dextrose, Lokelma ordered, medication adjustments. PA McCurdy in agreement for inpatient.

## 2023-02-25 NOTE — Plan of Care
 Admission Note Nursing Mark [name] is a 89 y.o. male admitted with a chief complaint of near syncope. Patient arrived from ED via stretcher.Patient is A&O X4.  Vitals:  02/24/23 2345 02/24/23 2348 02/24/23 2349 02/25/23 0000 BP: (!) 106/55  (!) 93/48 (!) 108/55 Pulse: 67  79 68 Resp:     Temp:     TempSrc:     SpO2: 100% 95%   Weight:     Height:     Oxygen therapy Oxygen TherapySpO2: 95 %Device (Oxygen Therapy): room airI have reviewed the patient's current medication orders..I have reviewed patient valuables Belongings charted in last 7 days: Patient Valuables   Patient Valuables Flowsheet                    PATIENT VALUABLE(S)       Jewelry disposition At bedside/locker/closet 02/24/23 2153     Comments: Pt is A&OX4. On room air. No c/o pain, dizziness or discomfort. Pt's potassium was 6.0. Dr Gwen Pounds was notified. Lokelma, insulin and dextrose were ordered and administered. Fingerstick was done per order. Pt is SR on tele. Foley is intact with good flow. Moisture dermatitis in groin and a stage 1 pressure injury around perineum. Desitin was applied on areas. Pt has molds on chest and back.  Pt is assist x1 with walker. Bed on the lowest position. Call bell within reach. Safety measures maintained. See flowsheets, patient education and plan of care for additional information.

## 2023-02-25 NOTE — Plan of Care
 Plan of Care Overview/ Patient StatusProblem: Adult Inpatient Plan of CareGoal: Readiness for Transition of CareOutcome: Initial problem identification 89 year old man presented to ED for evaluation s/p near fall. PMHx: HTN, HLD, and CAD. ED work up with no acute injuries-placed into observation status for monitoring as he reports increased weakness. He resides with daughter who has been sick with pneumonia and unable to assist her father. He reports increased weakness. Rollator/cane and WC available at home, Known to [facility]  for SN/PT and [facility] [employer] PT evaluation is pending-anticipate he would benefit from STR before returning home. 1661: PASSAR submitted and approved-ID [id number] Case Management Screening and Evaluation  Flowsheet Row Most Recent Value Case Management Screening: Chart review completed. If YES to any question below then proceed to CM Eval/Plan  Is there a change in their cognitive function No Do you anticipate that the pt will have any discharge needs requiring CM intervention? Yes Has there been an unscheduled readmission within the last 30 days and/or four (4) encounters (encounters include: ED, OBS, Inpatient) within the last six (6) months? No Were there services prior to admission ( Examples: Assisted Living, HD, Homecare, Extended Care Facility, Methadone, SNF, Outpatient Infusion Center) No Negative/Positive Screen Positive Screening: Complete CM Evaluation and Plan Case manager will take lead to arrange post-acute care services and continue to work with the care team as the patient progresses towards discharge Yes Case Manager Attestation  I have reviewed the medical record and completed the above screen. CM staff will follow patient's progress and discuss the plan of care with the Treatment Team. Yes Case Management Evaluation and Plan  Arrived from prior to admission home/apartment/condo Do you have a caregiver, or do you anticipate the need for a caregiver given the change in your physicial function? Yes Permission to Speak to Caregiver? No Lives with Child(ren), Adult Patient Requires Care Coordination Intervention Due To Change in physical function Prior to Hospitalization: Assistance Needed/DME being used Ambulation, Bathing Ambulation Assistance/DME: Manual wheelchair, Straight cane, Rollator Bathing Assistance/DME: shower chair Documented Insurance Accurate Yes Any financial concerns related to anticipated discharge needs No Patient's home address verified Yes Patient's PCP of record verified Yes Last Date Seen by PCP 0-3 months Source of Clinical History  Patient's clinical history has been reviewed and source of Information is: Medical Record, Patient Case Manager Attestation  I have reviewed the medical record and completed the above evaluation with the following recommendations. Yes Discharge Planning Coordination Recommendations  Discharge Planning Coordination Recommendations Needs not determined at this time Case Manager reviewed plan of care/ continuum of care need's with  Patient, Interdisciplinary Team, Family  Caydon Feasel RN, MSN, ED/Obs CM
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
## 2023-02-25 NOTE — Plan of Care
 Inpatient Occupational Therapy EvaluationDefault Flowsheet Data (most recent)   IP Adult OT Eval/Treat - 02/25/23 1421    Date of Visit / Treatment  Date of Visit / Treatment 02/25/23   Note Type Evaluation   Progress Report Due 03/11/23   Start Time 150   End Time 220    General Information  Pertinent History Of Current Problem Per chart: Pt is a 89 y.o. male w/ PMH of HTN, HLD, CAD who presents to the ED with c/o near syncope while transferring from the toilet today followed by generalized weakness. Per chart pt lives with his dtr who is presently sick and unable to care for him.   Subjective pt agreeable   General Observations in bed, RA, nAD, tele, foley   Precautions/Limitations Fall Precautions;Bed alarm   Precautions/Limitations Comment orthostatic hypotension   Fall History yes, reason for admission    Weight Bearing Status  Weight Bearing Status WNL - Within normal limits    Prior Level of Functioning/Social History  Additional Comments Pt lives in second floor condominium with about 5 steps to enter, reports he has 2 stair lifts to get up to his apartment. Ambulates with rollator recently, also owns a RW, cane, and wheelchair, has a shower chair. Daughter lives on first floor unit, provides prn assist. Pt mod (I) for all mobility and ADLs but has had progressive weakness over the past 2 months. Daughter is sick currently per chart    Vital Signs and Orthostatic Vital Signs  Lying Orthostatic BP 107/53   Sitting Orthostatic BP 75/45   Orthostatic comment asymotmatic sitting EOB. aware of previous drop in BP with PT earlier today. No standing performed. Pt assisted back to bed. RN informed.    Pain/Comfort  Pain Comment (Pre/Post Treatment Pain) no c/o    Cognition  Overall Cognitive Status Within Functional Limits   Orientation Level Oriented X4   Following Commands Follows one step commands without difficulty   Personal Safety / Judgment Fall risk   Cognition Comments pleasant, appeared frustrated with low BP    Vision/ Hearing  Vision Assessment Results No vision deficits noted   Hearing difficulties / Use of hearing aids HOH    Range of Motion  Range of Motion Comments B UE/LE WFL    Manual Muscle Testing  Manual Muscle Testing Comments deconditioned    Muscle Tone  Muscle Tone Testing Results No muscle tone deficits noted    Coordination  Coordination Comments intact GMC    Sensory Assessment  Sensory Tests Results No sensory impairment noted    Skin Assessment  Skin Assessment See Nursing Documentation    Balance  Sitting Balance: Static  FAIR+     Maintains static position without assist or device, may require Supervision or Verbal Cues (>2 minutes)   Sitting Balance: Dynamic  FAIR      Performs dynamic activities through 75% range Contact Guard or partial range (50-75%) with Supervision   Balance Skills Training Comment SBA at EOB, no stand    ADL: Copywriter, advertising Comments foley    ADL: Eating/Self-Feeding  Independence/Assistance Level Complete independence     AM-PAC - Daily Activity IP Short Form  Help needed from another person putting on/taking off regular lower body clothing 2 - A Lot   Help needed from another person for bathing (incl. washing, rinsing, drying) 2 - A Lot   Help needed from another person for toileting (incl. using toilet, bedpan, urinal) 2 - A Lot  Help needed from another person putting on/taking off regular upper body clothing 3 - A Little   Help needed from another person taking care of personal grooming such as brushing teeth 3 - A Little   Help needed from another person eating meals 4 - None   AM-PAC Daily Activity Raw Score (Total of rows above) 16   CMS Score (based on Raw Score - with G Code) 16 - 53.32% impaired      (G Code - CK)    Bed Mobility  Supine-to-Sit Independence/Assistance Level Minimum assist;Assist of 1;Verbal cues   Supine-to-Sit Assist Device Head of bed elevated;Bed rails   Sit-to-Supine Independence/Assistance Level Minimum assist;Assist of 1;Verbal cues   Sit-to-Supine Assist Device Head of bed elevated    Functional Ambulation  Functional Ambulation Comments NA due to orthostatic hypotension    Handoff Documentation  Handoff Patient in bed;Bed alarm;Patient instructed to call nursing for mobility;Discussed with nursing    Activity Tolerance  Activity Tolerance Comments poor    Therapeutic Exercise  Therapeutic Exercise Comments encouraged sitting with HOB up and AROM B UE/LE in bed    Therapeutic Functional Activity  Therapeutic Functional Activity Comments ed on OT role, POC, DC planning    Clinical Impression  Rehab Diagnosis orthostatic hypostension, s/p fall   Initial Assessment pt presents with a decline in all function. Pt with + orthostatic hypotension dueing OT evaluation. Low BP at EOB, unable to progress from EOB. Pt will require moderate complexity care upon DC.   Criteria for Skilled Therapeutic Interventions Met yes;treatment indicated   Rehab Potential good, to achieve stated therapy goals    Patient/Family Stated Goals  Patient/Family Stated Goal(s) return to prior level of functioning    Frequency/Equipment Recommendations  OT Frequency 3x per week   Next Treatment Expected 02/27/23   OT/OTA completing this assessment Mark [name]    OT Recommendations for Inpatient Admission  ADL Recommendations assist of 1   to EOB, bedlevel/EOB ADLs   Education - Learning Assessment  Education Topic Functional mobility techniques;ADL techniques;Safety;Precautions;Therapy role/rehab process;Discharge planning   Learners Patient   Readiness Acceptance   Method Explanation   Response Verbalizes Understanding    Planned Treatment / Interventions  Education Treatment / Interventions Patient Education / Training    OT Discharge Summary  Disposition Recommendation Moderate complexity support and therapy to progress functional mobility/ ADLs/ IADLs recommended for post- acute care.  See assessment for additional details.   Additional Therapy Recommendations Physical Therapy Services in Discharge Environment;Occupational Therapy Services in Discharge Environment   Equipment Recommendations for Discharge To be determined pending progress     Problem: Occupational Therapy GoalsGoal: Occupational Therapy GoalsDescription: OT Goals1. Pt will be Cg with commode transfer with RW2. Pt will be min A with toilet act3. Pt will be set up with groom at [facility]. Pt will be CG with bed mobilityOutcome: Interventions implemented as appropriate Mark [name]  MOT,OTR/L  [facility] [phone]

## 2023-02-25 NOTE — ED Notes
 7:12 PM report received from RN, assuming care at this time.Floor Handoff Telemetry: 	[]  Yes		[x]  NoCode Status:   [x]  Full		[]  DNR		[]  DNI		Other (specify):Safety Precautions: [x]  Fall Risk  []  Sitter   []  Restraints	[]  Suicidal	[]  None	Other (specify):Mentation/Orientation:	 A&O (Self, person, place, time) x   4       	 Disoriented to:                    	 Special Accommodations: []  Hearing impaired   []  Blind  []  Nonverbal  []  Cognitive impairmentOxygenation Upon Admission: [x]  RA	[]  NC	[]  Venti  []  Simple Mask []  Other	Baseline O2 Status? []  Yes	[]  NoAmbulation: []  Independent	[]  Cane   []  Walker	[x]  Wheelchair	[]  Bedbound		[]  Hemiplegic	[]  Paraplegic	[]  QuadraplegicEliminiation: []  Independent	[]  Commode	[]  Bedpan/Urinal  []  Straight Cath [x]  Foley cath			[]  Urostomy	[]  Colostomy	Other (specify):Diarrhea/Loose stool : []  1x within 24h  []  2x within 24h  []  3x within 24h  [x]  None 	C.Diff Order: 	[]  Ordered- needs to be collected             []  Collected-sent to lab             []  Resulted - Negative C.Diff             []  Resulted - Positive C.Diff[]  Not Ordered   [x]  N/ASkin Alteration: []  Pressure Injury []  Wound []  None [x]  Skin not assessedDiet: [x]  Regular/No order placed	[]  NPO		Other (specify):IV Access: [x]  PIV   []  PICC    []  Port    [] Central line    []  A-line    Other (specify)IVF/GTT Running Upon ED Departure? [x]  No	    []  Yes (specify):Outstanding Meds/Treatments/Tests:Patient Belongings:Are the belongings documented?          [x]  No	    []  YesIs someone taking belongings home?   [x]  No     []  Yes  Who? (specify)                                   Alford Highland, RN

## 2023-02-25 NOTE — Progress Notes
 [facility]	 [facility]	Medicine Progress NoteHospitalist ServiceAttending Provider: Garvin Fila, MD [phone]:  [id number] Day #0 Subjective: CC/Interim History/ROS: Patient found resting in bed with daughter at bedside in NAD, Pt states he tried to get up with PT and didn't feel good and was unable to stand. Denies sensation of lightheadedness or dizziness. States he misjudged attempting to sit on toilet the other day at home and slid down to the floor, but denies precipitating symptoms. He is surprised about his potassium levels as his daughter watches what he eats like a hawk. Feels he is overmedicated with his blood pressure. States they had stopped losartan and then restarted it a few weeks ago, but he is unsure why.  He endorses multiple recent falls and admissions. No overnight events. Objective: Vitals:Last 24 hours: Temp:  [97.3 ?F (36.3 ?C)-98.1 ?F (36.7 ?C)] 97.9 ?F (36.6 ?C)Pulse:  [62-79] 68Resp:  [16-22] 22BP: (90-117)/(46-64) 117/64SpO2:  [95 %-100 %] 99 %Physical Exam Physical ExamConstitutional:     Appearance: He is normal weight. HENT:    Head: Normocephalic and atraumatic. Eyes:    Conjunctiva/sclera: Conjunctivae normal. Cardiovascular:    Rate and Rhythm: Normal rate and regular rhythm. Pulmonary:    Effort: Pulmonary effort is normal.    Breath sounds: Normal breath sounds. Abdominal:    General: Bowel sounds are normal. There is no distension.    Palpations: Abdomen is soft.    Tenderness: There is no abdominal tenderness. Genitourinary:   Comments: Foley draining clear yellow urine Skin:   General: Skin is warm and dry. Neurological:    Mental Status: He is alert and oriented to person, place, and time. Mental status is at baseline. Assessment: 89 y.o. male w/ a pmhx of HTN, HLD, CAD, DJD, BPH w/ foley, CKD4, who presented to the ED with a fall while attempting to transfer to toilet today and generalized weakness. Found to have hyperkalemia and orthostatic hypotension.  Plan: #Fall - Per pt, no sx of pre-syncope. Hisotry more c/w mechnical as he misjudged transferring to toilet and walker doesn't fit into bathroom though noted to be significantly orthostatic here (also likely contributing)- CTH unremarkable for acute pathology - PT recommending STR#Orthostatic Hypotension - Severely orthostatic today- Will give gentle IVF today - Repeat orthostatics tomorrow - Would reduce BP medications as able and tolerate degree of supine HTN- Hopefully just volume, but suspect chronic component as multiple falls recently and appears similar presentations - If no improvement consider compression stockings #Hyperkalemia - K+ 6.0 on admission --> improved to 4.5 today - Likely I/s/o CKD and recent re-initiation of losartan - Will hold losartan - Renal Diet #CKD4- Cr 3.20 --> suspect close to baseline as seen in labs for careverywhere - CO2 chronically low --> start bicarb supplementation and uptitrate as able #CV: CAD, HTN- BPs 95-117/50-60s- Suspect over-medicated for HTN- Cont ASA- Cont plavix - Holding lasix, imdur, coreg, losartan (would dc losartan on discharge)- Cont rosuvastatin #BPH, Urinary Retention - Chronic foley - Cont flomax#DJD- Cont gabapenting 200 mg nightly #Psych- Cont lexapro #Chronic Anemia - hgb 8.2 and stable - Iron studies unable to be interpreted on last admission --> can repeat - Consider darbe as adjunct treatment#F/E/N: Diet Renal Non Dialysis   Comorbidities Comorbidities present on admission:Thrombocytopenia noted on admission. 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)Chronic AnemiaSecondary diagnoses occurring during hospitalization:HyperkalemiaThrombocytopenia VTE PPx:  SQHMobility: OOB w/ assistDispo: Pending clinical improvement Full CodeI spent 35 minutes today on this encounter before, during and after the visit, reviewing labs and records,  evaluating and examining the patient, entering orders and documenting the visit. Signed:Clydean Posas, PA-CAfter 1600, please contact covering provider or Hospitalist in Dynamic Role5:41 PM

## 2023-02-25 NOTE — Plan of Care
 Inpatient Physical Therapy Evaluation IP Adult PT Eval/Treat - 02/25/23 0910    Date of Visit / Treatment  Date of Visit / Treatment 02/25/23   Note Type Evaluation   Progress Report Due 03/11/23   Start Time 8:40   End Time 9:10   Total Treatment Time 30 min    General Information  Pertinent History Of Current Problem Per chart: Pt is a 89 y.o. male w/ PMH of HTN, HLD, CAD who presents to the ED with c/o near syncope while transferring from the toilet today followed by generalized weakness. Per chart pt lives with his dtr who is presently sick and unable to care for him.   Subjective I was doing good up until two months ago when I was in the hospital.   Referring Physician  Dr. Harrold Donath Observations pt received supine in bed, tele, foley, agreeable to PT eval, cleared by RN   Precautions/Limitations Fall Precautions;Bed alarm;Chair alarm   Precautions/Limitations Comment +orthostatic hypotension    Fall History yes, reason for admission    Weight Bearing Status  Weight Bearing Status WNL - Within normal limits    Prior Level of Functioning/Social History  Additional Comments Pt lives in second floor condominium with about 5 steps to enter, reports he has 2 stair lifts to get up to his apartment. Ambulates with rollator recently, also owns a RW, cane, and wheelchair, has a shower chair. Daughter lives on first floor unit, provides prn assist. Pt mod (I) for all mobility and ADLs but has had progressive weakness over the past 2 months.    Vital Signs and Orthostatic Vital Signs  Orthostatic Vitals Orthostatic Vital Signs   Lying Orthostatic BP 103/55   Lying Orthostatic Pulse 67   Sitting Orthostatic BP 73/43    Sitting Orthostatic Pulse 77   Standing Orthostatic BP 57/36    Standing Orthostatic Pulse 82   Orthostatic comment +dizziness/lightheadedness    Pain/Comfort  Pain Comment (Pre/Post Treatment Pain) pt denies pain Cognition  Orientation Level Oriented to person;Oriented to place;Oriented to time;Oriented to situation   Level of Consciousness alert   Following Commands Follows one step commands without difficulty   Personal Safety / Judgment Fall risk   Attention Requires cues   Cognition Comments pleasant and cooperative, HOH, slow to respond at times    Vision/ Hearing  Hearing difficulties / Use of hearing aids HOH    Range of Motion  Range of Motion Examination bilateral upper extremity ROM was WFL;bilateral lower extremity ROM was [facility]    Manual Muscle Testing  Manual Muscle Testing Comments BUE and BLE grossly 3+/5, generalized weakness    Muscle Tone  Muscle Tone Testing Results No muscle tone deficits noted    Neurologic  Neurologic Assessment Comments +tremors all extremities, incr with mobility    Coordination  Coordination Comments GMC slowed    Sensory Assessment  Sensory Tests Results No sensory impairment noted   Sensory Assessment Comments denies numbness/tingling    Skin Assessment  Skin Assessment See Nursing Documentation    Balance  Sitting Balance: Static  FAIR+     Maintains static position without assist or device, may require Supervision or Verbal Cues (>2 minutes)   Sitting Balance: Dynamic  FAIR      Performs dynamic activities through 75% range Contact Guard or partial range (50-75%) with Supervision   Standing Balance: Static POOR+    Minimal assist to maintain static position with no Assistive Device   Standing Balance: Dynamic  POOR     Moves through 1/4 to 1/2 ROM range with moderate assist to right self   Balance Assist Device Rolling walker   Balance Skills Training Comment assist x 1    Bed Mobility  Supine-to-Sit Independence/Assistance Level Moderate assist;Assist of 1   Supine-to-Sit Assist Device Head of bed elevated;Hand held assist;Bed rails   Sit-to-Supine Independence/Assistance Level Moderate assist;Assist of 1   Sit-to-Supine Assist Device Hand held assist;Bed rails   Bed Mobility, Impairments strength decreased;decreased endurance/activity tolerance;postural control impaired   Bed mobility was limited by: hypotension   +lightheadedness   Sit-Stand Transfer Training  Symptoms Noted During/After Treatment (Sit-to-Stand Transfer Training) dizziness   +significant orthostatic hypotension  Sit-to-Stand Transfer Independence/Assistance Level Moderate assist;Assist of 1   Sit-to-Stand Transfer Assist Device Rolling walker   Stand-to-Sit Transfer Independence/Assistance Level Minimum assist;Assist of 1   Stand-to-Sit Transfer Assist Device Rolling walker   Sit-Stand Transfer Comments very slow and tremulous upon standing, unable to stand for more than 1 minute due to weakness/dizziness, found to be very hypotensive (see orthostatic vitals)    Gait Training  Gait Training Comments unable to perform    Handoff Documentation  Handoff Patient in bed;Bed alarm;Patient instructed to call nursing for mobility;Discussed with nursing    Activity Tolerance  Activity Tolerance Comments poor    PT- AM-PAC - Basic Mobility Screen- How much help from another person do you currently need.....  Turning from your back to your side while in a a flat bed without using rails? 3 - A Little - Requires a little help (supervision, minimal assistance). Can use assistive devices.   Moving from lying on your back to sitting on the side of a flat bed without using bed rails? 2 - A Lot - Requires a lot of help (maximum to moderate assistance). Can use assistive devices.   Moving to and from a bed to a chair (including a wheelchair)? 2 - A Lot - Requires a lot of help (maximum to moderate assistance). Can use assistive devices.   Standing up from a chair using your arms(e.g., wheelchair or bedside chair)? 2 - A Lot - Requires a lot of help (maximum to moderate assistance). Can use assistive devices.   To walk in a hospital room? 1 - Total - Requires total assistance or cannot do it at all.   Climbing 3-5 steps with a railing? 1 - Total - Requires total assistance or cannot do it at all.   AMPAC Mobility Score 11   TARGET Highest Level of Mobility Mobility Level 4, Transfer to chair   ACTUAL Highest Level of Mobility Mobility Level 5, Stand for 1 minute    Therapeutic Exercise  Therapeutic Exercise Comments encouraged AROM to BUE and BLE in bed and seated EOB    Therapeutic Functional Activity  Therapeutic Functional Activity Comments educ on PT role, POC, goals, pacing, safety, bed mobility, positioning, ther ex    Clinical Impression  Initial Assessment Pt tolerated PT eval fairly.  Limited by significant hypotension, generalized weakness, decr balance, activity tolerance, and functional mobility.  Recommend assist x 2 for transfers only.  Will cont to follow for skilled PT to maximize safety and functional mobility.  Recommend moderate complexity support upon discharge.   Criteria for Skilled Therapeutic Interventions Met yes;treatment indicated   Rehab Potential good, to achieve stated therapy goals   Demonstrates Need for Referral to Another Service occupational therapy     Patient/Family Stated Goals  Patient/Family Stated Goal(s) feel better  Frequency/Equipment Recommendations  PT Frequency 3x per week   Next Treatment Expected 02/27/23   PT/PTA completing this assessment Kim   Equipment Needs During Admission/Treatment Rolling walker    PT Recommendations for Inpatient Admission  Activity/Level of Assist transfers only;assist of 2;with rolling walker    Planned Treatment / Interventions  Plan for Next Visit progress as tolerated   Education Treatment / Interventions Patient Education / Training   PT role, POC, goals, safety, positioning, ther ex, txfers with staff assist, d/c planning   PT Discharge Summary Physical Therapy Disposition Recommendation Moderate complexity support and therapy to progress functional mobility/ ADLs/ IADLs recommended for post- acute care.  See assessment for additional details.   Additional Therapy Recommendations Physical Therapy Services in Discharge Environment   Equipment Recommendations for Discharge Patient has all necessary durable medical equipment     Problem: Physical Therapy GoalsGoal: Physical Therapy GoalsDescription: PT GOALS1. Patient will perform bed mobility with minimal assist2. Patient will perform transfers with minimal assist using appropriate assistive device 3. Patient will ambulate a minimum of 50 feet with minimal assist using appropriate assistive device 4. Patient will ascend and descend at least 5 steps with minimal assist using rail5. Patient will improve BUE and BLE strength by at least 1/2 MM grade for functional mobilityOutcome: Initial problem identification Mark [name], PT, DPT

## 2023-02-25 NOTE — ED Provider Notes
 Chief Complaint Patient presents with  Fall greater than 89 years old   BIBA from home for near fall from toilet height. Pt was lowered to floor after losing balance getting up from toilet. Denies head strike, CP, SOB, or dizziness. Recently discharged from [facility] for a fall. Chronic foley changed 3 days ago, now c/o discomfort.  EMERGENCY MEDICINE RESIDENT NOTEHPI:Pt is a 89 y.o. male w/ PMH of HTN, HLD, CAD who presents with fall while transferring to toilet. He reports using walker to ambulate to bathroom with no issues. He reports sliding onto his bottom, no headstrike. No neck pain, back pain, chest pain, abd pain or n/v.Initial PE Findings:Patient is hypotensive, afebrile, heart rate is normal, does not appear to be in respiratory distress, alert and oriented, no gross neurological deficits. No ecchymosis, abrasion or laceration. No scalp or facial bone TTP. No C/T/L-spine TTP. Lungs CTAB, no obvious cardiac murmur. No anterior chest wall TTP. Abd soft, NTND, no rebound or guarding. No LE edema. Full ROM of LE. Sensation intact.Remaining review of systems and physical exam as detailed below.ED Course and Dispo:  - plan to evaluate labs and imaging to r/o ICH, FX- likely d/c after ambulation trial, pt signed out to evening providerThis patient was presented to and discussed with Dr. Burna Sis and a treatment plan and disposition were collaboratively agreed upon.[name], MD, MBSEmergency Medicine ResidentAvailable through Mobile HeartbeatThis note may have been generated using M*Modal voice dictation software please excuse any typographical errors due to flaws in voice recognition.This encounter occurred within the context of the Covid-19 pandemic. ------------------------------------------------------------------------------------------------------------------An acute or life threatening problem was considered during this evaluation  A decision regarding hospitalization was made during this visit  External data reviewed: Notes (OSH or non-ED) and LabsCare limited by SDOH: Ambulatory Care Access.Independent interpretation of: [place]  Physical ExamED Triage Vitals [02/24/23 1335]BP: (!) 102/59Pulse: 60Pulse from  O2 sat: n/aResp: 16Temp: 97.5 ?F (36.4 ?C)Temp src: OralSpO2: 99 % BP 108/62  - Pulse 69  - Temp 97.5 ?F (36.4 ?C) (Oral)  - Resp 16  - SpO2 100% Physical Exam ProceduresAttestation/Critical CarePatient Reevaluation: Attending Supervised: ResidentI saw and examined the patient. I agree with the findings and plan of care as documented in the resident's note except as noted below. Additional acute and/or chronic problems addressed: 89 year old male presents after sliding off the toilet, pending [place] head/cervical spine to assess for intracranial hemorrhage vs fractureLaura K HansonCT head/cervical spine negative. Plan to discharge patient home. However, he is not able to ambulate well and appears generally weak. He states he lives with his daughter but she is currently ill with pneumonia and unable to care for him. Will add labs and EKG then admit for PT/OT with known departure from baseline and also need for care coordination if daughter is unable to care for patient.Comments as of 02/24/23 1913 Fri Feb 24, 2023 1510 Received sign out at 1323. 89 year old male with fall off the toilet, pending [place] head/c spine then possible discharge [LH]  Comments User Index[LH] Sindy Messing, DO   Clinical Impressions as of 02/24/23 1913 Fall, initial encounter Generalized weakness  ED DispositionAdmit  Sindy Messing, DO12/27/24 1532 Sindy Messing, DO12/27/24 [phone]

## 2023-02-26 LAB — BASIC METABOLIC PANEL
BKR ANION GAP: 12 (ref 7–17)
BKR BLOOD UREA NITROGEN: 58 mg/dL — ABNORMAL HIGH (ref 8–23)
BKR BUN / CREAT RATIO: 20 (ref 8.0–23.0)
BKR CALCIUM: 8.3 mg/dL — ABNORMAL LOW (ref 8.8–10.2)
BKR CHLORIDE: 107 mmol/L (ref 98–107)
BKR CO2: 18 mmol/L — ABNORMAL LOW (ref 20–30)
BKR CREATININE DELTA: -0.3
BKR CREATININE: 2.9 mg/dL — ABNORMAL HIGH (ref 0.40–1.30)
BKR EGFR, CREATININE (CKD-EPI 2021): 19 mL/min/{1.73_m2} — ABNORMAL LOW (ref >=60)
BKR GLUCOSE: 100 mg/dL (ref 70–100)
BKR POTASSIUM: 4.4 mmol/L (ref 3.3–5.3)
BKR SODIUM: 137 mmol/L (ref 136–144)

## 2023-02-26 LAB — IRON AND TIBC
BKR IRON SATURATION: 35 % (ref 15–50)
BKR IRON: 63 ug/dL (ref 59–158)
BKR TOTAL IRON BINDING CAPACITY: 182 ug/dL — ABNORMAL LOW (ref 250–450)

## 2023-02-26 LAB — CBC WITHOUT DIFFERENTIAL
BKR WAM ANC (ABSOLUTE NEUTROPHIL COUNT): 2.76 x 1000/ÂµL (ref 2.00–7.60)
BKR WAM HEMATOCRIT (2 DEC): 26.4 % — ABNORMAL LOW (ref 38.50–50.00)
BKR WAM HEMOGLOBIN: 8.1 g/dL — ABNORMAL LOW (ref 13.2–17.1)
BKR WAM MCH (PG): 23.4 pg — ABNORMAL LOW (ref 27.0–33.0)
BKR WAM MCHC: 30.7 g/dL — ABNORMAL LOW (ref 31.0–36.0)
BKR WAM MCV: 76.3 fL — ABNORMAL LOW (ref 80.0–100.0)
BKR WAM MPV: 10.3 fL (ref 8.0–12.0)
BKR WAM PLATELETS: 123 x1000/ÂµL — ABNORMAL LOW (ref 150–420)
BKR WAM RDW-CV: 17 % — ABNORMAL HIGH (ref 11.0–15.0)
BKR WAM RED BLOOD CELL COUNT.: 3.46 M/ÂµL — ABNORMAL LOW (ref 4.00–6.00)
BKR WAM WHITE BLOOD CELL COUNT: 5.5 x1000/ÂµL (ref 4.0–11.0)

## 2023-02-26 LAB — NT-PROBNPE: BKR B-TYPE NATRIURETIC PEPTIDE, PRO (PROBNP): 1793 pg/mL — ABNORMAL HIGH (ref <450.0)

## 2023-02-26 LAB — FERRITIN: BKR FERRITIN: 646 ng/mL — ABNORMAL HIGH (ref 30–400)

## 2023-02-26 MED ORDER — SODIUM CHLORIDE 0.9 % INTRAVENOUS SOLUTION
INTRAVENOUS | Status: DC
Start: 2023-02-26 — End: 2023-02-27
  Administered 2023-02-26 (×2): via INTRAVENOUS

## 2023-02-26 MED ORDER — ORAL/ENTERAL REHYDRATION FLUIDS (ELECTROLYTE DRINK/WATER)
ORAL | Status: AC
Start: 2023-02-26 — End: ?

## 2023-02-26 NOTE — Plan of Care
 Plan of Care Overview/ Patient Mark [name] is a male. Pt with mild  pain on the left big toe when touching at this time.Neuro: AOx4CV: on Tele, sinus rhythm. O2 hard to measure on fingers. Give 1 L nasal canula to see if it improves.Resp: 1 L NC. Lung sounds coarse, with infrequent cough, non productive.GI Last Bowel Movement: 02/26/23, soft stool. On renal non-dialysis diet.GU Chronic foley. No flush port.UreTHral Catheter 12/18/22 1315 16 10 10  (Active) Closed system maintained Yes 02/26/23 1500 Securement ** secured to upper leg with adhesive device 02/26/23 1500 Bag positioned below bladder Yes 02/26/23 1500 Unobstructive flow of urine Yes 02/26/23 1500 Drainage Method drainage bag to dependent drainage 02/26/23 1500 Indicators present  to keep catheter in place? Chronic catheter documented on admission 02/26/23 1500 Daily Review of Necessity ** completed 02/26/23 1500 Tolerance no signs/symptoms of discomfort 02/26/23 1500 Foreskin circumcised 02/26/23 1500 Urine Characteristics sediment;red 02/26/23 1500 Daily Catheter Care catheter care provided 02/26/23 1500 Urine Output (mL) 550 02/26/23 1500 Catheter irrigation done? no 02/26/23 1500       Skin: intact. With redness on coccyx. See skin flowsheet. T+P q2h, wedge in use, no. Skin check performed. Does not have foam dressing to coccyx in place. Pt remains on pressure reduction mattress and heels elevated off bed. Restraints: no. Access Periph IV 02/25/23 1355 dorsum right arm over-the-needle catheter system 22 gauge 1 in length IV Team (Active) SiteCare/Dressing Status/Securement dressing dry and intact 02/26/23 0930 Date Dressing Applied/Changed 02/25/23 02/25/23 1355 Next Date Dressing Change 03/04/2023 02/25/23 1355 Lumen 1 Patency/Care Patent;flushed w/o difficulty 02/26/23 0930 Site Signs asymptomatic without redness, warmth, swelling, pain, palpable cord, streak formation, drainage, dressing intact 02/26/23 0930 Phlebitis 0-->no symptoms 02/26/23 0930 Infiltration/Extravasation Assessment 0-->no symptoms 02/26/23 0930 Patient Education Instructed to keep IV site dry;Instructed to call nurse if site is painful,red,swollen, burning;Instructed to call nurse if fluid leaking from site 02/26/23 0930 Daily Review of Necessity ** completed 02/26/23 0930 Mobility: AMPAC Mobility Score: 14Bed alarm in place, all safety measures implemented.Isolation Precautions: Isolation Precautions: protective environment maintainedSignificant events from this shift: newly transferred to this unit. Skin check done.

## 2023-02-26 NOTE — Transfer Summaries
 TRANSFER OUT NOTEASSESSMENTNeuro: Cognitive/Neuro/Behavioral WDL: WDLCardiac: Cardiac WDL: .WDL except;cardiac monitorTelemetry: 	[x]  Yes		[]  NoRespiratory:Respiratory WDL: WDLGastrointestinal: GI WDL: WDLGenitourinary: Genitourinary WDL: .WDL except;voiding ability/characteristicsElimination:   []  Independent	[]  Commode	[]  Bedpan/Urinal  []  Straight Cath		   [x]  Foley cath 	[]   Urostomy	[]  Colostomy	Other (specify):Skin Alteration: [x]  Pressure Injury []  Wound []  None Ambulation:     []  Independent []  Cane   [x]  Walker	[]  Wheelchair       	               []  Bedbound     []  Hemiplegic []  Paraplegic []  Quadraplegic Dietary Orders (From admission, onward)     Start     Ordered  02/25/23 0845  Diet Renal Non Dialysis  DIET EFFECTIVE NOW      Question Answer Comment Sodium Restriction: 2 gm NA  Potassium Restriction: 2gm k  Initiate Nutrition Management Protocol (Yes/No?) Yes - Initiate Protocol    02/25/23 0844     Medication Administration Special Directions: IV Access: [x]  PIV   []  PICC    []  Port    [] Central line    []  A-line    Other (specify)Outstanding Meds/Treatments/Tests: Please continue infusion of normal saline 100 mL/hrSafety Precautions: []  None	[]  Sitter   []  Restraints	[]  Suicidal                      	  [x]  Fall Risk	 Other (specify):TRANSFER INFORMATION Patient Belongings:I have reviewed patient valuables Belongings charted in last 7 days:Patient Valuables   Patient Valuables Flowsheet                    PATIENT VALUABLE(S)       Jewelry disposition At bedside/locker/closet 02/24/23 2153     Is someone taking belongings home?   [x]  No     []  Yes  Who? (specify) _________Transferred to [employer] (unit) for Transfer to inpatient .Equipment/Device: Escorted QV:[id number] staffVitals:  02/26/23 0605 02/26/23 1127 02/26/23 1133 02/26/23 1137 BP: 103/61 112/68 (!) 85/54 (!) 92/47 Pulse: 68 71 (!) 114 78 Resp: 16    Temp: 98.2 ?F (36.8 ?C)    TempSrc: Oral    SpO2: 95%    Weight:     Height:      Verbal report given to Misty Stanley, RNReport considered of patient's Situation, Background, Assessment, and RecommendationOpportunity for questions and clarification was provided

## 2023-02-26 NOTE — Plan of Care
 Plan of Care Overview/ Patient StatusProblem: Adult Inpatient Plan of CareGoal: Readiness for Transition of CareOutcome: Interventions implemented as appropriate Pt has been converted to IP for further care. His Cr remains elevated 2.90 this am and he is still orthostatic. He has not been cleared for discharge at this time. Casimer Bilis RN, MSN, ED/Obs [employer]

## 2023-02-26 NOTE — Plan of Care
 Problem: Fall Injury RiskGoal: Absence of Fall and Fall-Related InjuryOutcome: Interventions implemented as appropriate Problem: Skin Injury Risk IncreasedGoal: Skin Health and IntegrityOutcome: Interventions implemented as appropriate Problem: WoundGoal: Optimal CopingOutcome: Interventions implemented as appropriateGoal: Optimal Functional AbilityOutcome: Interventions implemented as appropriateGoal: Absence of Infection Signs and SymptomsOutcome: Interventions implemented as appropriateGoal: Improved Oral IntakeOutcome: Interventions implemented as appropriateGoal: Optimal Pain Control and FunctionOutcome: Interventions implemented as appropriateGoal: Skin Health and IntegrityOutcome: Interventions implemented as appropriateGoal: Optimal Wound HealingOutcome: Interventions implemented as appropriate Problem: Adult Inpatient Plan of CareGoal: Readiness for Transition of CareOutcome: Interventions implemented as appropriatePlan of Care Overview/ Patient StatusTime: 0700-1930Neuro/Cognition: A&O x 4. Pt is calm and cooperativeCardiac: NSR on tele. Pt is hypotensive, BP medication held per provider's order. Pt positive for ortho.Respiratory: Pt is on room air. No SOB reportedGI/GU: Chronic foley in place, continent of bowel. LBM 12/28Skin: Molds on chest and back, stage 1 pressure injury to buttocks, open to air, moisture dermatitis to bilateral groin. Desitin cream applied.Mobility: Assist x 2 to transferPain: No pain reportedSleep: No issues reported.IV: PIV intact, no redness, warmth or swelling noted at IV site. Dressing dry and intact.Safety: Safety checks maintained, call light and personal items within reach. Bed alarm activated. 1815Blood in urine from foley catheter, provider was notified.1915Bedside handoff completed

## 2023-02-26 NOTE — Plan of Care
 Problem: Fall Injury RiskGoal: Absence of Fall and Fall-Related InjuryOutcome: Interventions implemented as appropriate Problem: Skin Injury Risk IncreasedGoal: Skin Health and IntegrityOutcome: Interventions implemented as appropriate Problem: WoundGoal: Optimal CopingOutcome: Interventions implemented as appropriateGoal: Optimal Functional AbilityOutcome: Interventions implemented as appropriateGoal: Absence of Infection Signs and SymptomsOutcome: Interventions implemented as appropriateGoal: Improved Oral IntakeOutcome: Interventions implemented as appropriateGoal: Optimal Pain Control and FunctionOutcome: Interventions implemented as appropriateGoal: Skin Health and IntegrityOutcome: Interventions implemented as appropriateGoal: Optimal Wound HealingOutcome: Interventions implemented as appropriate Problem: Adult Inpatient Plan of CareGoal: Readiness for Transition of CareOutcome: Interventions implemented as appropriate Problem: Physical Therapy GoalsGoal: Physical Therapy GoalsDescription: PT GOALS1. Patient will perform bed mobility with minimal assist2. Patient will perform transfers with minimal assist using appropriate assistive device 3. Patient will ambulate a minimum of 50 feet with minimal assist using appropriate assistive device 4. Patient will ascend and descend at least 5 steps with minimal assist using rail5. Patient will improve BUE and BLE strength by at least 1/2 MM grade for functional mobilityOutcome: Interventions implemented as appropriate Problem: Occupational Therapy GoalsGoal: Occupational Therapy GoalsDescription: OT Goals1. Pt will be Cg with commode transfer with RW2. Pt will be min A with toilet act3. Pt will be set up with groom at [facility]. Pt will be CG with bed mobilityOutcome: Interventions implemented as appropriatePlan of Care Overview/ Patient StatusTime: 0700-1400Neuro/Cognition: A&O x 4. Pt is calm and cooperative Cardiac: NSR on tele. Respiratory: Pt is on room air. No SOB reported GI/GU: Chronic foley in place, urine bright red in color. Continent of bowel, LBM 12/29. Provider notified about blood in urine. Skin: Molds on chest and back, stage 1 pressure injury to buttocks, open to air, moisture dermatitis to bilateral groin and buttocks. Desitin cream applied. Mobility: Assist x 2 to transfer Pain: No pain reported Sleep: No issues reported. IV: PIV intact, no redness, warmth or swelling noted at IV site. Dressing dry and intact. Continuous infusion of NS running at 100 mL/hr. Safety: Safety checks maintained, call light and personal items within reach. Bed alarm activated.1400Pt transferred to [facility]. Report given to [place], Charity fundraiser.

## 2023-02-27 ENCOUNTER — Telehealth: Admit: 2023-02-27 | Payer: PRIVATE HEALTH INSURANCE | Attending: Cardiovascular Disease | Primary: Internal Medicine

## 2023-02-27 LAB — CBC WITH AUTO DIFFERENTIAL
BKR WAM ABSOLUTE IMMATURE GRANULOCYTES.: 0.01 x 1000/ÂµL (ref 0.00–0.30)
BKR WAM ABSOLUTE LYMPHOCYTE COUNT.: 1.23 x 1000/ÂµL (ref 0.60–3.70)
BKR WAM ABSOLUTE NRBC (2 DEC): 0 x 1000/ÂµL (ref 0.00–1.00)
BKR WAM ANC (ABSOLUTE NEUTROPHIL COUNT): 2.7 x 1000/ÂµL (ref 2.00–7.60)
BKR WAM BASOPHIL ABSOLUTE COUNT.: 0.03 x 1000/ÂµL (ref 0.00–1.00)
BKR WAM BASOPHILS: 0.6 % (ref 0.0–1.4)
BKR WAM EOSINOPHIL ABSOLUTE COUNT.: 0.67 x 1000/ÂµL (ref 0.00–1.00)
BKR WAM EOSINOPHILS: 13 % — ABNORMAL HIGH (ref 0.0–5.0)
BKR WAM HEMATOCRIT (2 DEC): 26 % — ABNORMAL LOW (ref 38.50–50.00)
BKR WAM HEMOGLOBIN: 8 g/dL — ABNORMAL LOW (ref 13.2–17.1)
BKR WAM IMMATURE GRANULOCYTES: 0.2 % (ref 0.0–1.0)
BKR WAM LYMPHOCYTES: 23.8 % (ref 17.0–50.0)
BKR WAM MCH (PG): 23.5 pg — ABNORMAL LOW (ref 27.0–33.0)
BKR WAM MCHC: 30.8 g/dL — ABNORMAL LOW (ref 31.0–36.0)
BKR WAM MCV: 76.2 fL — ABNORMAL LOW (ref 80.0–100.0)
BKR WAM MONOCYTE ABSOLUTE COUNT.: 0.52 x 1000/ÂµL (ref 0.00–1.00)
BKR WAM MONOCYTES: 10.1 % (ref 4.0–12.0)
BKR WAM MPV: 10.4 fL (ref 8.0–12.0)
BKR WAM NEUTROPHILS: 52.3 % (ref 39.0–72.0)
BKR WAM NUCLEATED RED BLOOD CELLS: 0 % (ref 0.0–1.0)
BKR WAM PLATELETS: 132 x1000/ÂµL — ABNORMAL LOW (ref 150–420)
BKR WAM RDW-CV: 16.7 % — ABNORMAL HIGH (ref 11.0–15.0)
BKR WAM RED BLOOD CELL COUNT.: 3.41 M/ÂµL — ABNORMAL LOW (ref 4.00–6.00)
BKR WAM WHITE BLOOD CELL COUNT: 5.2 x1000/ÂµL (ref 4.0–11.0)

## 2023-02-27 LAB — BASIC METABOLIC PANEL
BKR ANION GAP: 10 (ref 7–17)
BKR BLOOD UREA NITROGEN: 43 mg/dL — ABNORMAL HIGH (ref 8–23)
BKR BUN / CREAT RATIO: 17.2 (ref 8.0–23.0)
BKR CALCIUM: 8.3 mg/dL — ABNORMAL LOW (ref 8.8–10.2)
BKR CHLORIDE: 108 mmol/L — ABNORMAL HIGH (ref 98–107)
BKR CO2: 19 mmol/L — ABNORMAL LOW (ref 20–30)
BKR CREATININE DELTA: -0.4
BKR CREATININE: 2.5 mg/dL — ABNORMAL HIGH (ref 0.40–1.30)
BKR EGFR, CREATININE (CKD-EPI 2021): 23 mL/min/{1.73_m2} — ABNORMAL LOW (ref >=60)
BKR GLUCOSE: 112 mg/dL — ABNORMAL HIGH (ref 70–100)
BKR POTASSIUM: 4.5 mmol/L (ref 3.3–5.3)
BKR SODIUM: 137 mmol/L (ref 136–144)

## 2023-02-27 LAB — PHOSPHORUS     (BH GH L LMW YH): BKR PHOSPHORUS: 3.3 mg/dL (ref 2.2–4.5)

## 2023-02-27 LAB — MAGNESIUM: BKR MAGNESIUM: 2 mg/dL (ref 1.7–2.4)

## 2023-02-27 NOTE — Plan of Care
 Problem: Fall Injury RiskGoal: Absence of Fall and Fall-Related InjuryOutcome: Interventions implemented as appropriate Problem: Skin Injury Risk IncreasedGoal: Skin Health and IntegrityOutcome: Interventions implemented as appropriate Problem: WoundGoal: Optimal CopingOutcome: Interventions implemented as appropriateGoal: Optimal Functional AbilityOutcome: Interventions implemented as appropriateGoal: Absence of Infection Signs and SymptomsOutcome: Interventions implemented as appropriateGoal: Improved Oral IntakeOutcome: Interventions implemented as appropriateGoal: Optimal Pain Control and FunctionOutcome: Interventions implemented as appropriateGoal: Skin Health and IntegrityOutcome: Interventions implemented as appropriateGoal: Optimal Wound HealingOutcome: Interventions implemented as appropriate Problem: Adult Inpatient Plan of CareGoal: Readiness for Transition of CareOutcome: Interventions implemented as appropriate Plan of Care Overview/ Patient StatusPatient AxOx4, resting comfortably in bed, no complaints of pain or discomfort expressed. Cardiac monitoring in progress, no events overnight. IVF continued until 0200, compression stocks to B/L LE's present. Pt with chronic foley, hematuria noted, urine is clear with no clots present. Patient turned and repositioned while awake. Pt is a stand and pivot. Takes meds whole with water. Call bell within reach, safety maintained, hourly rounding completed.

## 2023-02-27 NOTE — Progress Notes
 [facility] Progress NoteHospital Medicine ServiceAttending Provider: Lucien Mons, MD  Location:  [phone] Day #2Subjective   Patient seen and examined on 02/27/2023 between 10 am and 10:30 amCC: Fall, initial encounterInterim History: He is alert and oriented x 3, no acute distress. Reports he feels discouraged as his legs gave out while attempting to stand today. Denies dizziness, vision changes or syncope upon standing. Denies chest pain or shortness of breath. Objective  Vitals:Temp:  [97.3 ?F (36.3 ?C)-98.2 ?F (36.8 ?C)] 97.7 ?F (36.5 ?C)Pulse:  [62-78] 77Resp:  [16-22] 22BP: (114-128)/(64-72) 124/64SpO2:  [94 %-100 %] 100 %Device (Oxygen Therapy): room airO2 Flow (L/min):  [1] (P) 1I/O's:Intake/Output Summary (Last 24 hours) at 02/27/2023 1440Last data filed at 02/27/2023 1344Gross per 24 hour Intake 1090 ml Output 1575 ml Net -485 ml  Physical ExamConstitutional:     General: He is not in acute distress.Cardiovascular:    Rate and Rhythm: Normal rate. Rhythm irregular.    Pulses: Normal pulses.    Heart sounds: Normal heart sounds. Pulmonary:    Effort: Pulmonary effort is normal.    Breath sounds: Normal breath sounds. Abdominal:    General: Bowel sounds are normal.    Palpations: Abdomen is soft. Genitourinary:   Comments: Indwelling foley catheter with hematuria. No clots seen, adequate outputSkin:   General: Skin is warm and dry.    Capillary Refill: Capillary refill takes less than 2 seconds. Neurological:    Mental Status: He is alert.  Labs:Complete Blood CountResults in Past 7 DaysResult Component Current Result Hematocrit 26.00 (L) (02/27/2023) Hemoglobin 8.0 (L) (02/27/2023) MCH 23.5 (L) (02/27/2023) MCHC 30.8 (L) (02/27/2023) MCV 76.2 (L) (02/27/2023) MPV 10.4 (02/27/2023) Platelets 132 (L) (02/27/2023) RBC 3.41 (L) (02/27/2023) WBC 5.2 (02/27/2023)   Chemistry  Lab Results Component Value Date  NA 137 02/27/2023  K 4.5 02/27/2023  CL 108 (H) 02/27/2023  CO2 19 (L) 02/27/2023  BUN 43 (H) 02/27/2023  CREATININE 2.50 (H) 02/27/2023  GLU 112 (H) 02/27/2023  Lab Results Component Value Date  CALCIUM 8.3 (L) 02/27/2023  ALKPHOS 66 04/07/2022  AST 43 (H) 04/07/2022  ALT 31 04/07/2022  BILITOT 1.2 04/07/2022  Peripheral Access:Periph IV 02/25/23 1355 dorsum right arm over-the-needle catheter system 22 gauge 1 in length IV Team (Active)  Foley Catheter:UreTHral Catheter 02/21/23 (Active) Foley order placed.Imaging:No results found. I reviewed lab results in formulating this patient's plan of care.   Assessment: 89 y.o. male PMHx HTN, HLD, CAD, DJD, BPH w/ foley, CKD4, who presented to the ED with a fall while attempting to transfer to toilet and generalized weakness. Found to have hyperkalemia and orthostatic hypotension. Also with AKI on CKD and diarrhea. Continues with orthostasis but asymptomatic. Plan: # Fall: 2/2 orthostasis: from EMR review ongoing for months# HTN, CAD- CTH and cervical spine unremarkable- continues with orthostasis s/p IV fluids. This am while sitting SBP >80 but denies symptoms- continue to hold carvedilol, lasix, Imdur and losartan. Suspect overmedicated for HTN. Continue Plavix- keep compression stocking to BLE and abdominal binder- daily orthostatics until SBP >100. Encourage good PO intake.- PT recommends moderate complexity- Follow up with  Dr Frutoso Chase as outpatient. Discussed patient's clinical status with his office per patient's request# Hx known Aortic aneurysm: 5.2 cm x 4.5 cm. - previously seen by Vascular who recommended elective repair. Sought 2nd opinion from Vascular at [facility] who did not feel there was increase in size (see Nadelmann note from 10/2022)- currently denies SOB or chest pain- continue outpatient follow up # AKI on CKD  4: baseline appears 1.7# Hyperkalemia on admission: s/p improvement with lokelma- K stable and Cr improving- continue to hold losartan- continue sodium bicarb 650 mg TID, CO2 improving# BPH# Urinary retention with chronic foley- today noting hematuria- holding sub Q heparin, no blood clots seen. Blood counts stable and no urinary retention.- maintain foley catheter- continue Flomax bid- consult Urology if new clots or urinary retention# DJD- gabapentin 200 mg nightly# Psych- Lexapro 5 mg dailyFamily CommunicationDaughter [name] given medical update via telephone Comorbidities Comorbidities present on admission:Thrombocytopenia noted on admission. 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)Chronic AnemiaSecondary diagnoses occurring during hospitalization:HyperkalemiaHypocalcemiaThrombocytopenia Diet: Diet Renal Non DialysisOral/Enteral Rehydration Fluids (Electrolyte Drink/Water) 480 mLVTE PPx:  heparin (PORCINE) Medication Reconciliation: ED: Unable to Assess Communication: Family: Daughter [name] and RNDischarge Readiness: Expected Discharge Date: 03/01/23 AM-PAC (RN/PT): 13 / 11 Physical Therapy Disposition Recommendation: Moderate complexity (02/25/23)  Expected discharge location: Short Term Rehab Full CodeMedical decision making for this patient was moderate.Signed:K. Alysia Penna, APRN12/30/20242:40 PM

## 2023-02-27 NOTE — Telephone Encounter
 Received call from [place], APRN at [facility] currently admitted to hospital for generalized weakness and near fall with transfer from toilet.Pt found to be orthostatic. SBP 120s -> 80s when standing.All BP meds on hold including- Carvedilol, Imdur, Lasix, and LosartanPt with known hx of abdominal aortic aneurysm.Per APRN, pt will likely be discharged to STR at some point, would like to get BP better controlled prior to discharge.Pt wanted JN to be aware- Becky aware that Paula Libra is out of office today and tomorrow, will return on Thursday.

## 2023-02-27 NOTE — Plan of Care
 Inpatient Physical Therapy Progress Note IP Adult PT Eval/Treat - 02/27/23 1156    Date of Visit / Treatment  Date of Visit / Treatment 02/27/23   Note Type Progress Note   Start Time 1131   End Time 1156   Total Treatment Time 25    General Information  Subjective agreeable   General Observations supine, RA, cleared by RN, B teds   Precautions/Limitations Fall Precautions;Bed alarm;Chair alarm   Precautions/Limitations Comment +orthos    Weight Bearing Status  Weight Bearing Status WNL - Within normal limits    Vital Signs and Orthostatic Vital Signs  Vital Signs Free text RA, reports dizziness with stance   Lying Orthostatic BP 105/62   Sitting Orthostatic BP 104/57   initially 76/46, improved with seated rest  Standing Orthostatic BP 80/46   Orthostatic comment unable to tolerate stance long enough to take BP    Pain/Comfort  Pain Comment (Pre/Post Treatment Pain) no c/o    Cognition  Overall Cognitive Status Within Functional Limits    Skin Assessment  Skin Assessment See Nursing Documentation    Balance  Sitting Balance: Static  FAIR+     Maintains static position without assist or device, may require Supervision or Verbal Cues (>2 minutes)   Sitting Balance: Dynamic  FAIR      Performs dynamic activities through 75% range Contact Guard or partial range (50-75%) with Supervision   Standing Balance: Static POOR-     Maximal assist to maintain static position with no Assistive Device   Balance Assist Device Rolling walker   Balance Skills Training Comment Ax2    Bed Mobility  Supine-to-Sit Independence/Assistance Level Minimum assist;Assist of 1   Supine-to-Sit Assist Device Bed rails   Sit-to-Supine Independence/Assistance Level Minimum assist;Assist of 1   Sit-to-Supine Assist Device Hand held assist   Bed Mobility Comments A to return supine d/t incr dizziness from standing trial    Sit-Stand Transfer Training Sit-to-Stand Transfer Independence/Assistance Level Minimum assist;Assist of 2   Sit-to-Stand Transfer Assist Device Rolling walker   Stand-to-Sit Transfer Independence/Assistance Level Minimum assist;Assist of 2   Stand-to-Sit Transfer Assist Device Rolling walker   Sit-Stand Transfer Comments reports sign incr dizziness while in stance, unable to tolerate stance long enough to take BP    Handoff Documentation  Handoff Patient in bed;Bed alarm;Patient instructed to call nursing for mobility;Discussed with nursing    PT- AM-PAC - Basic Mobility Screen- How much help from another person do you currently need.....  Turning from your back to your side while in a a flat bed without using rails? 3 - A Little - Requires a little help (supervision, minimal assistance). Can use assistive devices.   Moving from lying on your back to sitting on the side of a flat bed without using bed rails? 3 - A Little - Requires a little help (supervision, minimal assistance). Can use assistive devices.   Moving to and from a bed to a chair (including a wheelchair)? 2 - A Lot - Requires a lot of help (maximum to moderate assistance). Can use assistive devices.   Standing up from a chair using your arms(e.g., wheelchair or bedside chair)? 2 - A Lot - Requires a lot of help (maximum to moderate assistance). Can use assistive devices.   To walk in a hospital room? 1 - Total - Requires total assistance or cannot do it at all.   Climbing 3-5 steps with a railing? 1 - Total - Requires total assistance or cannot do it  at all.   AMPAC Mobility Score 12   TARGET Highest Level of Mobility Mobility Level 4, Transfer to chair   ACTUAL Highest Level of Mobility Mobility Level 5, Stand for 1 minute    Therapeutic Exercise  Therapeutic Exercise Comments Encouraged AROMx4 as able    Clinical Impression  Follow up Assessment Pt tolerates therapy poorly. Pt continues to be limited by orthostatic hypotension. Cont to rec d/c with mod complexity support   Criteria for Skilled Therapeutic Interventions Met yes;treatment indicated   Rehab Potential good, to achieve stated therapy goals    Patient/Family Stated Goals  Patient/Family Stated Goal(s) feel better    Frequency/Equipment Recommendations  PT Frequency 3x per week   Next Treatment Expected 02/28/23   PT/PTA completing this assessment Syd    PT Recommendations for Inpatient Admission  Activity/Level of Assist transfers only;assist of 2;stedy    Planned Treatment / Interventions  Education Treatment / Interventions Patient Education / Training   Planned Treatment/Interventions Comments poc, pt role, mobility, safety    PT Discharge Summary  Physical Therapy Disposition Recommendation Moderate complexity support and therapy to progress functional mobility/ ADLs/ IADLs recommended for post- acute care.  See assessment for additional details.   Equipment Recommendations for Discharge To be determined pending progress     Antony Odea, DPT

## 2023-02-27 NOTE — Progress Notes
 Inpatient Occupational Therapy Progress NoteDefault Flowsheet Data (most recent)   IP Adult OT Eval/Treat - 02/27/23 1205    Date of Visit / Treatment  Date of Visit / Treatment 02/27/23   Start Time 1140   End Time 1205   Total Treatment Time 25    General Information  Subjective agreeable   General Observations supine in bed, room air, foley, NAD   Precautions/Limitations Fall Precautions;Bed alarm;Chair alarm   Precautions/Limitations Comment + OH    Weight Bearing Status  Weight Bearing Status WNL - Within normal limits    Vital Signs and Orthostatic Vital Signs  Lying Orthostatic BP 105/62   Sitting Orthostatic BP 104/57   initially 76/46 inc with more time sitting  Standing Orthostatic BP 80/46   Orthostatic comment did not tolerate standing long enouggh    Pain/Comfort  Pain Comment (Pre/Post Treatment Pain) no pain    Patient Coping  Observed Emotional State accepting   Verbalized Emotional State acceptance   Diversional Activities television    Cognition  Overall Cognitive Status Within Functional Limits    Skin Assessment  Skin Assessment See Nursing Documentation    Balance  Sitting Balance: Static  FAIR+     Maintains static position without assist or device, may require Supervision or Verbal Cues (>2 minutes)   Sitting Balance: Dynamic  FAIR      Performs dynamic activities through 75% range Contact Guard or partial range (50-75%) with Supervision   Standing Balance: Static POOR+    Minimal assist to maintain static position with no Assistive Device   Standing Balance: Dynamic  POOR     Moves through 1/4 to 1/2 ROM range with moderate assist to right self   Balance Assist Device Rolling walker   Balance Skills Training Comment A x 2    Activities of Daily Living  Activities of Daily Living Comments deferred due to [facility] and dizziness at EOB     AM-PAC - Daily Activity IP Short Form  Help needed from another person putting on/taking off regular lower body clothing 2 - A Lot   Help needed from another person for bathing (incl. washing, rinsing, drying) 2 - A Lot   Help needed from another person for toileting (incl. using toilet, bedpan, urinal) 2 - A Lot   Help needed from another person putting on/taking off regular upper body clothing 3 - A Little   Help needed from another person taking care of personal grooming such as brushing teeth 3 - A Little   Help needed from another person eating meals 4 - None   AM-PAC Daily Activity Raw Score (Total of rows above) 16   CMS Score (based on Raw Score - with G Code) 16 - 53.32% impaired      (G Code - CK)    Bed Mobility  Supine-to-Sit Independence/Assistance Level Minimum assist   Supine-to-Sit Assist Device Head of bed elevated   Sit-to-Supine Independence/Assistance Level Minimum assist   Sit-to-Supine Assist Device No device    Sit-Stand Transfer Training  Symptoms Noted During/After Treatment (Sit-to-Stand Transfer Training) dizziness   Sit-to-Stand Transfer Independence/Assistance Level Minimum assist;Assist of 2   Sit-to-Stand Transfer Assist Device Rolling walker   Stand-to-Sit Transfer Independence/Assistance Level Minimum assist;Assist of 2   Stand-to-Sit Transfer Assist Device Rolling walker   Transfer Safety Analysis Concerns cues for hand placement   Transfer Safety Analysis Impairments impaired balance;decreased strength;decreased endurance/activity tolerance   Occupation-Based Indications: Sit to Stand Transfers To perform grooming tasks standing;To complete  toilet/commode transfers;To simulate the use of grab bars in bathroom   Sit-Stand Transfer Comments difficulty tolerating standing, dizziness, tremulous    Handoff Documentation  Handoff Patient in bed;Bed alarm;Discussed with nursing;Patient instructed to call nursing for mobility    Activity Tolerance  Activity Tolerance Comments poor Therapeutic Functional Activity  Interventions to support occupations Challenged activity tolerance to increase independence with occupation-based tasks;Engaged in balance tasks to improve ability to complete ADL tasks;Engaged in functional transfers in preparation for ADL tasks    Clinical Impression  Follow up Assessment tol OT poorly. Patient has impaired balance, dec activity tol, OH, dizziness. Difficulty tolerating. Rec mod complexity   Criteria for Skilled Therapeutic Interventions Met yes;treatment indicated   Rehab Potential good, to achieve stated therapy goals    Patient/Family Stated Goals  Patient/Family Stated Goal(s) return to prior level of functioning    Frequency/Equipment Recommendations  OT Frequency 3x per week   Next Treatment Expected 03/02/23   OT/OTA completing this assessment Mark [name]    OT Recommendations for Inpatient Admission  ADL Recommendations assist of 2;bedside commode;with rolling walker    Planned Treatment / Interventions  Plan for Next Visit progress as tol   Education Treatment / Interventions Patient Education / Training   OT POC   OT Discharge Summary  Disposition Recommendation Moderate complexity support and therapy to progress functional mobility/ ADLs/ IADLs recommended for post- acute care.  See assessment for additional details.     Mark [name] OTR/L12/30/2024

## 2023-02-27 NOTE — Progress Notes
 [facility]	 [facility]	Daily Progress NoteHospital Medicine ServiceAttending Provider: Garvin Fila, MD [phone]:  [id number] Day #1Subjective   Patient seen and examined on 12/29/2024CC: Fall, initial encounterInterim History: No sig overnight events noted.Pt seen and examined today. Daughter at bedside.Pt is feeling lousy. He is concerned about his aortic aneurysm, is having diarrhea several times a day over the past few days, and is concerned about his kidneys. He currently denies chest pain, headache, dizziness, dyspnea, abd pain, n/v, chills/sweats. He is currently wearing 2L oxygen, he does not use oxygen at home.   Objective  Current Meds:aspirin, 81 mg, Daily[Held by provider] carvediloL, 12.5 mg, BID WCclopidogreL, 75 mg, Dailyescitalopram oxalate, 5 mg, Daily[Held by provider] furosemide, 20 mg, Dailygabapentin, 200 mg, Nightlyheparin (PORCINE), 5,000 Units, Q8H[Held by provider] isosorbide mononitrate, 30 mg, Daily[Held by provider] losartan, 25 mg, Dailyrosuvastatin, 40 mg, Dailysodium bicarbonate, 650 mg, TIDsodium chloride, 3 mL, Q8Htamsulosin, 0.4 mg, BIDzinc oxide, 1 Application, 2 times daily Vitals:Temp:  [97.3 ?F (36.3 ?C)-98.2 ?F (36.8 ?C)] 97.3 ?F (36.3 ?C)Pulse:  [62-114] 62Resp:  [16-22] 16BP: (85-117)/(47-68) 114/67SpO2:  [94 %-99 %] 94 %Device (Oxygen Therapy): room airI/O's:Intake/Output Summary (Last 24 hours) at 02/26/2023 1554Last data filed at 02/26/2023 1500Gross per 24 hour Intake 480 ml Output 1950 ml Net -1470 ml  Physical ExamVitals and nursing note reviewed. Constitutional:     General: He is not in acute distress.HENT:    Nose: Nose normal.    Comments: NC in b/l nares   Mouth/Throat:    Mouth: Mucous membranes are moist.    Comments: Poor dentition Eyes:    Extraocular Movements: Extraocular movements intact.    Comments: Wearing glasses Cardiovascular:    Rate and Rhythm: Normal rate and regular rhythm.    Heart sounds: No murmur heard.Pulmonary:    Effort: No respiratory distress.    Breath sounds: Normal breath sounds. No wheezing. Abdominal:    General: Bowel sounds are normal.    Palpations: Abdomen is soft.    Tenderness: There is no abdominal tenderness. Genitourinary:   Comments: +foley in place with slightly pink tinged urine, no clots Musculoskeletal:       General: Normal range of motion.    Comments: Compression stockings on BLE Skin:   General: Skin is warm and dry. Neurological:    Mental Status: He is alert.  Peripheral Access:Periph IV 02/25/23 1355 dorsum right arm over-the-needle catheter system 22 gauge 1 in length IV Team (Active)  Foley Catheter:UreTHral Catheter 12/18/22 1315 16 10 10  (Active) Needs foley order placed.Labs:I have reviewed the patient's pertinent labs as resulted in the EMR.Lab Results Component Value Date  NA 137 02/26/2023  K 4.4 02/26/2023  CL 107 02/26/2023  CO2 18 (L) 02/26/2023  BUN 58 (H) 02/26/2023  CREATININE 2.90 (H) 02/26/2023  CALCIUM 8.3 (L) 02/26/2023 Lab Results Component Value Date  WBC 5.5 02/26/2023  HGB 8.1 (L) 02/26/2023  HGB 11.9 (L) 07/31/2019  HCT 26.40 (L) 02/26/2023  HCT 36.2 (L) 09/29/2019  HCT 38.0 (L) 07/31/2019  MCV 76.3 (L) 02/26/2023  PLT 123 (L) 02/26/2023 Imaging:No results found. I reviewed lab results, microbiology, imaging, and test results in formulating this patient's plan of care.   Assessment: 89 y.o. male PMHx sig for HTN, HLD, CAD, DJD, BPH w/ foley, CKD4, who presented to the ED with a fall while attempting to transfer to toilet and generalized weakness. Found to have hyperkalemia and orthostatic hypotension. Also with AKI on CKD and diarrhea. Plan: #Fall - mechanical fall likely iso  orthostatics/imbalance- CTH unremarkable for acute pathology - PT recommending STR #Orthostatic Hypotension - Severely orthostatic today again on recheck- will again give IVF today and repeat orthos tomorrow am- placed compression stockings to BLE and abdominal binder as well- Will ask his cardiologist (Dr. Frutoso Chase) tomorrow for recs in re: med management iso ongoing hypotension#Aortic aneurysm- patient requesting multiple times that his outpt cardiologist (Dr. Frutoso Chase) be updated and asked about imaging to evaluate his aortic aneurysm- will ask team to reach out Monday  #Hyperkalemia - K+ 6.0 on admission --> improved to 4.4 today - Likely I/s/o CKD and recent re-initiation of losartan - Will hold losartan - Renal Diet #Diarrhea- pt reports diarrhea several times a day the past few days- has not received bowel regimen- did get lokelma two days ago for hyperkalemia- will monitor- if diarrhea persists, new leukocytosis or fever; then would check stool path #CKD4- Cr 2.9 today- unclear what his true baseline Cr is- giving IVF as above- cont bicarb supplementation and uptitrate as able for chronically low CO2- avoid nephrotoxins- renally dose all meds #CV: CAD, HTN- Suspect over-medicated for HTN- Cont ASA- Cont plavix - Cont statin- Holding lasix, imdur, coreg, losartan- Monday team to consult outpt Cards (Dr. Frutoso Chase) to assist with med mgmt #BPH, Urinary Retention - Chronic foley - some hematuria overnight and today; appears to be clearing, no blood clots seen- H/H stable; will monitor closely- if any worsening hematuria, drop in H/H, or +clots, consult Uro then- Cont flomax #DJD- Cont gabapenting 200 mg nightly  #Psych- Cont lexapro  #Chronic Anemia - hgb 8.1 and stable - Iron studies unable to be interpreted on last admission --> can repeat - Consider darbe as adjunct treatment   Diet Renal Non DialysisOral/Enteral Rehydration Fluids (Electrolyte Drink/Water) 480 mLVTE PPx: Heparin SQ Discharge Readiness: Expected discharge date: TBDExpected discharge location: Short Term Rehab Full CodeI spent 50 minutes today on this encounter before, during and after the visit, reviewing labs and records, evaluating and examining the patient, entering orders and documenting the visit. Signed:Cayli Escajeda A. Fielding Mault, PA12/29/20243:54 PMPlease contact through Mobile HeartbeatPlease contact covering provider listed in Epic after 6pm. If no covering provider is available, please contact the provider in the [facility] Coverage or [facility] Hospitalist Coverage Dynamic Role

## 2023-02-27 NOTE — Plan of Care
 Plan of Care Overview/ Patient Mark [name] is a male. Pt with no pain at this time.Neuro: Aox4CV: WDL, denies CPResp: WDL, on RA, denies SOBGI Last Bowel Movement: 12/29/24GU UreTHral Catheter 02/21/23 (Active) Closed system maintained Yes 02/27/23 1000 Securement ** secured to upper leg with adhesive device 02/27/23 1000 Bag positioned below bladder Yes 02/27/23 1000 Unobstructive flow of urine Yes 02/27/23 1000 Drainage Method drainage bag to dependent drainage 02/27/23 1000 Indicators present  to keep catheter in place? Acute urinary retention/bladder outlet obstruction 02/27/23 1000 Daily Review of Necessity ** completed 02/27/23 1000 Urine Characteristics red 02/27/23 1000 Daily Catheter Care catheter care provided 02/27/23 1000 Skin:  See skin flowsheet. T+P q2h, wedge in use, no. Skin check performed. Does not have foam dressing to coccyx in place. Pt remains on pressure reduction mattress and heels elevated off bed. Access Periph IV 02/25/23 1355 dorsum right arm over-the-needle catheter system 22 gauge 1 in length IV Team (Active) SiteCare/Dressing Status/Securement dressing dry and intact 02/27/23 1000 Date Dressing Applied/Changed 02/25/23 02/25/23 1355 Next Date Dressing Change 03/04/2023 02/25/23 1355 Lumen 1 Patency/Care Patent;flushed w/o difficulty 02/27/23 1000 Site Signs asymptomatic without redness, warmth, swelling, pain, palpable cord, streak formation, drainage, dressing intact 02/27/23 1000 Phlebitis 0-->no symptoms 02/27/23 1000 Infiltration/Extravasation Assessment 0-->no symptoms 02/27/23 1000 Patient Education Instructed to keep IV site dry;Instructed to call nurse if site is painful,red,swollen, burning;Instructed to call nurse if fluid leaking from site 02/27/23 1000 Daily Review of Necessity ** completed 02/27/23 1000 Mobility: AMPAC Mobility Score: 13Bed alarm in place, all safety measures implemented.Isolation Precautions: Isolation Precautions: protective environment maintainedSignificant events from this shift:

## 2023-02-28 LAB — CBC WITHOUT DIFFERENTIAL
BKR WAM ANC (ABSOLUTE NEUTROPHIL COUNT): 2.75 x 1000/ÂµL (ref 2.00–7.60)
BKR WAM HEMATOCRIT (2 DEC): 27 % — ABNORMAL LOW (ref 38.50–50.00)
BKR WAM HEMOGLOBIN: 8.2 g/dL — ABNORMAL LOW (ref 13.2–17.1)
BKR WAM MCH (PG): 23.5 pg — ABNORMAL LOW (ref 27.0–33.0)
BKR WAM MCHC: 30.4 g/dL — ABNORMAL LOW (ref 31.0–36.0)
BKR WAM MCV: 77.4 fL — ABNORMAL LOW (ref 80.0–100.0)
BKR WAM MPV: 10 fL (ref 8.0–12.0)
BKR WAM PLATELETS: 130 x1000/ÂµL — ABNORMAL LOW (ref 150–420)
BKR WAM RDW-CV: 16.8 % — ABNORMAL HIGH (ref 11.0–15.0)
BKR WAM RED BLOOD CELL COUNT.: 3.49 M/ÂµL — ABNORMAL LOW (ref 4.00–6.00)
BKR WAM WHITE BLOOD CELL COUNT: 5.5 x1000/ÂµL (ref 4.0–11.0)

## 2023-02-28 LAB — BASIC METABOLIC PANEL
BKR ANION GAP: 9 (ref 7–17)
BKR BLOOD UREA NITROGEN: 39 mg/dL — ABNORMAL HIGH (ref 8–23)
BKR BUN / CREAT RATIO: 15.6 (ref 8.0–23.0)
BKR CALCIUM: 8.3 mg/dL — ABNORMAL LOW (ref 8.8–10.2)
BKR CHLORIDE: 107 mmol/L (ref 98–107)
BKR CO2: 21 mmol/L (ref 20–30)
BKR CREATININE DELTA: 0
BKR CREATININE: 2.5 mg/dL — ABNORMAL HIGH (ref 0.40–1.30)
BKR EGFR, CREATININE (CKD-EPI 2021): 23 mL/min/{1.73_m2} — ABNORMAL LOW (ref >=60)
BKR GLUCOSE: 107 mg/dL — ABNORMAL HIGH (ref 70–100)
BKR POTASSIUM: 4.3 mmol/L (ref 3.3–5.3)
BKR SODIUM: 137 mmol/L (ref 136–144)

## 2023-02-28 NOTE — Plan of Care
 Plan of Care Overview/ Patient StatusProblem: Adult Inpatient Plan of CareGoal: Readiness for Transition of CareOutcome: Interventions implemented as appropriateSTR referrals faxed to several facilities. CM will contiue to follow for safe DCP when medically ready.The Case Management  Department will continue to follow patient as they progress towards discharge.Cherie Dark RN,BSN,CCMCase ManagerYNHH Case Management Department

## 2023-02-28 NOTE — Progress Notes
 [facility] Progress NoteHospital Medicine ServiceAttending Provider: Lucien Mons, MD  Location:  [phone] Day #3Subjective   Patient seen and examined on 02/28/2023 between 9:30 am and 10:00 amCC: Fall, initial encounterInterim History: He is alert and oriented x 3, no acute distress. Reports feels well today and states his urine has now cleared. He wishes to attempt to stand with staff today. Denied dizziness upon sitting but reported dizziness with standing for prolonged period of time.  Denies chest pain, sob or palpitations Objective  Vitals:Temp:  [97.7 ?F (36.5 ?C)-98 ?F (36.7 ?C)] 98 ?F (36.7 ?C)Pulse:  [59-77] 72Resp:  [18-22] 18BP: (120-127)/(64-66) 127/66SpO2:  [97 %-100 %] 100 %Device (Oxygen Therapy): room airI/O's:Intake/Output Summary (Last 24 hours) at 02/28/2023 1140Last data filed at 02/28/2023 0600Gross per 24 hour Intake 900 ml Output 1325 ml Net -425 ml  Physical ExamConstitutional:     General: He is not in acute distress.Cardiovascular:    Rate and Rhythm: Normal rate. Rhythm irregular.    Pulses: Normal pulses.    Heart sounds: Normal heart sounds. Pulmonary:    Effort: Pulmonary effort is normal.    Breath sounds: Normal breath sounds. Abdominal:    General: Bowel sounds are normal.    Palpations: Abdomen is soft. Genitourinary:   Comments: Indwelling foley catheter now with yellow urineSkin:   General: Skin is warm and dry.    Capillary Refill: Capillary refill takes less than 2 seconds. Neurological:    Mental Status: He is alert.  Labs:Complete Blood CountResults in Past 7 DaysResult Component Current Result Hematocrit 27.00 (L) (02/28/2023) Hemoglobin 8.2 (L) (02/28/2023) MCH 23.5 (L) (02/28/2023) MCHC 30.4 (L) (02/28/2023) MCV 77.4 (L) (02/28/2023) MPV 10.0 (02/28/2023) Platelets 130 (L) (02/28/2023) RBC 3.49 (L) (02/28/2023) WBC 5.5 (02/28/2023)   Chemistry  Lab Results Component Value Date  NA 137 02/28/2023  K 4.3 02/28/2023  CL 107 02/28/2023  CO2 21 02/28/2023  BUN 39 (H) 02/28/2023  CREATININE 2.50 (H) 02/28/2023  GLU 107 (H) 02/28/2023  Lab Results Component Value Date  CALCIUM 8.3 (L) 02/28/2023  ALKPHOS 66 04/07/2022  AST 43 (H) 04/07/2022  ALT 31 04/07/2022  BILITOT 1.2 04/07/2022  Peripheral Access:Periph IV 02/25/23 1355 dorsum right arm over-the-needle catheter system 22 gauge 1 in length IV Team (Active)  Foley Catheter:UreTHral Catheter 02/21/23 (Active) Foley order placed.Imaging:No results found. I reviewed lab results in formulating this patient's plan of care.   Assessment: 89 y.o. male PMHx HTN, HLD, CAD, DJD, BPH w/ foley, CKD4, who presented to the ED with a fall while attempting to transfer to toilet and generalized weakness. Found to have hyperkalemia and orthostatic hypotension. Also with AKI on CKD and diarrhea. Was able to stand today with STEDY device but reported weakness with standing for prolonged time Plan: # Fall: 2/2 orthostasis: from EMR review ongoing for months# HTN, CAD- CTH and cervical spine unremarkable- continues with orthostasis but is slowly improving	- SBP lying 105/51 HR 72	- SBP sitting 101/55 HR 64	- was able to stand with STEDY device but unable to stand for vitals as legs became weak- continue to hold carvedilol, lasix, Imdur. D/c losartan as daughter states this was removed from med list- Suspect overmedicated for HTN. Continue Plavix- keep compression stocking to BLE and abdominal binder- daily orthostatics until SBP >100. Encourage good PO intake.- PT recommends moderate complexity- Follow up with  Dr Frutoso Chase as outpatient. Discussed patient's clinical status with his office per patient's request# Hx known Aortic aneurysm: 5.2 cm x 4.5 cm. - previously seen by  Vascular who recommended elective repair. Sought 2nd opinion from Vascular at [facility] who did not feel there was increase in size (see Nadelmann note from 10/2022)- currently denies SOB or chest pain- continue outpatient follow up # AKI on CKD 4: baseline appears 1.7# Hyperkalemia on admission: s/p improvement with lokelma- K stable and Cr improving- continue sodium bicarb 650 mg TID, CO2 improving# BPH# Urinary retention with chronic foley# Hemturia- hematuria now resolved after d/c of Sub Q heparin- No blood clots seen. Blood counts stable and no urinary retention.- maintain foley catheter- continue Flomax bid# DJD- gabapentin 200 mg nightly# Psych- Lexapro 5 mg dailyFamily CommunicationDaughter Mark [name] given medical update via telephone Comorbidities Comorbidities present on admission:Thrombocytopenia noted on admission. 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)Chronic AnemiaSecondary diagnoses occurring during hospitalization:HyperkalemiaHypocalcemiaThrombocytopenia Diet: Diet Renal Non DialysisOral/Enteral Rehydration Fluids (Electrolyte Drink/Water) 480 mLVTE PPx:   Medication Reconciliation: ED: Unable to Assess Communication: Family: Daughter Mark [name] and RNDischarge Readiness: Expected Discharge Date: 03/03/23 AM-PAC (RN/PT): 13 / 11 Physical Therapy Disposition Recommendation: Moderate complexity (02/28/23)  Expected discharge location: Short Term Rehab Full CodeMedical decision making for this patient was low.Signed:K. Alysia [name], APRN12/31/202411:40 AM

## 2023-02-28 NOTE — Plan of Care
 Plan of Care Overview/ Patient Mark [name] is a male. Pt with no pain at this time.Neuro: Aox4CV: not on teleResp: room air. Hard to monitor O2sat with fingers.GI Last Bowel Movement: 02/26/23. Regular diet.GU  chorionic foleyUreTHral Catheter 02/21/23 (Active) Closed system maintained Yes 02/28/23 1000 Securement ** secured to upper leg with adhesive device 02/28/23 1000 Bag positioned below bladder Yes 02/28/23 0500 Unobstructive flow of urine Yes 02/28/23 0500 Drainage Method drainage bag to dependent drainage 02/28/23 0500 Indicators present  to keep catheter in place? Chronic catheter documented on admission 02/28/23 1000 Daily Review of Necessity ** completed 02/28/23 0500 Urine Characteristics amber 02/28/23 1000 Daily Catheter Care catheter care provided 02/28/23 1000 Urine Output (mL) 900 02/28/23 0528 Skin: redness on coccyx area. See skin flowsheet. T+P q2h, wedge in use, no. Skin check performed. Does not have foam dressing to coccyx in place. Pt remains on pressure reduction mattress and heels elevated off bed. Restraints: no. Access Periph IV 02/25/23 1355 dorsum right arm over-the-needle catheter system 22 gauge 1 in length IV Team (Active) SiteCare/Dressing Status/Securement 3.15% Chlorhexadine /70 % alcohol 02/28/23 1000 Date Dressing Applied/Changed 02/25/23 02/25/23 1355 Next Date Dressing Change 03/04/2023 02/25/23 1355 Lumen 1 Patency/Care Patent;flushed w/o difficulty 02/28/23 1000 Site Signs asymptomatic without redness, warmth, swelling, pain, palpable cord, streak formation, drainage, dressing intact 02/28/23 1000 Phlebitis 0-->no symptoms 02/28/23 1000 Infiltration/Extravasation Assessment 0-->no symptoms 02/28/23 1000 Patient Education Instructed to keep IV site dry 02/28/23 1000 Daily Review of Necessity ** completed 02/28/23 1000 Mobility: AMPAC Mobility Score: 13Bed alarm in place, all safety measures implemented.Isolation Precautions: Isolation Precautions: contact precautions maintainedSignificant events from this shift:

## 2023-02-28 NOTE — Plan of Care
 Inpatient Physical Therapy Progress Note IP Adult PT Eval/Treat - 02/28/23 0846    Date of Visit / Treatment  Date of Visit / Treatment 02/28/23   Note Type Daily Note   Start Time 830   End Time 825   Total Treatment Time 15    General Information  Subjective Agreeable for PT   General Observations supine in bed, foley, NAD   Precautions/Limitations Fall Precautions;Bed alarm;Chair alarm;Orthostatic   Precautions/Limitations Comment HOH    Vital Signs and Orthostatic Vital Signs  Vital Signs Free text fatigue sitting EOB, BP 101/55 when returned to supine    Pain/Comfort  Pain Comment (Pre/Post Treatment Pain) no c/o pain    Cognition  Overall Cognitive Status Within Functional Limits    Skin Assessment  Skin Assessment See Nursing Documentation    Balance  Sitting Balance: Static  FAIR+     Maintains static position without assist or device, may require Supervision or Verbal Cues (>2 minutes)   Sitting Balance: Dynamic  FAIR-     Performs dynamic activities through partial range (50-75%) with Contact Guard    Bed Mobility  Symptoms Noted During/After Treatment fatigue   Supine-to-Sit Independence/Assistance Level Moderate assist;Assist of 1;Verbal cues   Supine-to-Sit Assist Device Draw pad;Hand held assist;Head of bed elevated   Sit-to-Supine Independence/Assistance Level Minimum assist;Assist of 1;Verbal cues   Bed Mobility, Impairments strength decreased;postural control impaired   Bed Mobility Comments c/o significant fatigue, denied dizziness, requested to return to supine    Handoff Documentation  Handoff Patient in bed;Patient instructed to call nursing for mobility    PT- AM-PAC - Basic Mobility Screen- How much help from another person do you currently need.....  Turning from your back to your side while in a a flat bed without using rails? 3 - A Little - Requires a little help (supervision, minimal assistance). Can use assistive devices.   Moving from lying on your back to sitting on the side of a flat bed without using bed rails? 2 - A Lot - Requires a lot of help (maximum to moderate assistance). Can use assistive devices.   Moving to and from a bed to a chair (including a wheelchair)? 2 - A Lot - Requires a lot of help (maximum to moderate assistance). Can use assistive devices.   Standing up from a chair using your arms(e.g., wheelchair or bedside chair)? 2 - A Lot - Requires a lot of help (maximum to moderate assistance). Can use assistive devices.   To walk in a hospital room? 1 - Total - Requires total assistance or cannot do it at all.   Climbing 3-5 steps with a railing? 1 - Total - Requires total assistance or cannot do it at all.   AMPAC Mobility Score 11   TARGET Highest Level of Mobility Mobility Level 4, Transfer to chair   ACTUAL Highest Level of Mobility Mobility Level 3, Sit on edge of bed    Clinical Impression  Follow up Assessment Fair tolerance, patient alert/cooperative, able to sit EOB with moderate assistance of one person.  Easily fatigued, also limited by weakness, impaired balance, and orthostatic hypotension.  Continue to recommend moderate complexity support at discharge   Criteria for Skilled Therapeutic Interventions Met yes   Rehab Potential good, to achieve stated therapy goals    Patient/Family Stated Goals  Patient/Family Stated Goal(s) feel better    Frequency/Equipment Recommendations  PT Frequency 3x per week   Next Treatment Expected 03/02/23   PT/PTA completing this assessment  dani   Equipment Needs During Admission/Treatment Hospital bed    PT Recommendations for Inpatient Admission  Activity/Level of Assist dangle edge of bed;assist of 1    PT Discharge Summary  Physical Therapy Disposition Recommendation Moderate complexity support and therapy to progress functional mobility/ ADLs/ IADLs recommended for post- acute care.  See assessment for additional details.     Jen Mow, DPT

## 2023-02-28 NOTE — Plan of Care
 SOCIAL WORK SCREENINGPatient Name: Mark [name] Record Number: [id number] Date of Birth: Feb 25, 1927SW Admission Screening  Flowsheet Row Most Recent Value SW Admission Screening  Concern for current abuse/neglect/interpersonal violence/sexual assault  No Concern for current homelessness No Current behavioral health concerns No Concern for active substance use No Incomplete emergency contact or next of kin on record No Patient identity unknown No Connected to state agency? No Group home or residential placement No Guardianship or conservatorship No  SOCIAL WORK NOTEPatient Name: Mark [name] Record Number: [id number] Date of Birth: [birth year]Medical Social Work Follow Up  [employer] Most Recent Value Admission Information  Document Type Progress Note (For Inpatient/ED Only) Prior psychosocial assessment has been documented within this hospitalization No (For Inpatient/ED Only) Prior psychosocial assessment has been documented within 30 days of this hospitalization No Reason for Current Social Work Involvement Support/Coping Source of Information Patient, Audiological scientist Comment Daughter - Mark [name]. Record Reviewed Yes Level of Care Inpatient What medium(s) of communication were used with patient/family/caregiver? Face-to-Face / In-Person Psychosocial issues requiring intervention Social Work screening. Psychosocial interventions 10-minute face to face encounter with Mark [name] to complete Social Work screening. Mark [name] presented as irritable throughout. He reported being upset that he has not received his lunch. He states that he contacted the cafe and that they said they are preparing the meal to be sent to the unit. I provided active listening, support, and empathy. Mark [name] expressed a desire to return home as soon as possible. He reported that he lives with his daughter. He states that he has home health aide, nursing, and PT services at home as well. He provided consent for me to speak to his daughter, Mark [name], to obtain additional information. Phone call placed to Mark [name] who expressed concern because Mark [name] stated he is returning home today. She reported that he will need to go to short term rehab to gain strength prior to returning home. Case management following for short term rehab acceptance. Social Work to remain available. Collaborations See above. Specific referrals to enhance community supports (include existing and new resources) See above. Handoff Required? No Social Work will take lead to arrange post-acute care services and continue work with the care team as patient progresses towards discharge No Next Steps/Plan (including hand-off): Case Management will take lead to arrange post -acute care services and continue to work with the care team as the patient progresses towards discharge.  Was the authorized person Charity fundraiser, legal guardian, and/or healthcare representative) notified? Spoke on phone Signature: Jadeyn Hargett [facility] Information: [phone]

## 2023-02-28 NOTE — Plan of Care
 Plan of Care Overview/ Patient StatusBP 120/66  - Pulse (!) 59  - Temp 98 ?F (36.7 ?C) (Oral)  - Resp 18  - Ht 5' 11 (1.803 m)  - Wt 86.4 kg  - SpO2 97%  - BMI 26.57 kg/m? Alert, oriented. SB/pivot to commode with assistance x2.Foley draining straw colored urine.No c/o pain. Barrier applied to buttocks. Call bell with in reach. Hourly rounding performed.Mark Washburn Rosario12/31/2024 7:37 AM

## 2023-02-28 NOTE — Progress Notes
 Inpatient Occupational Therapy Progress NoteDefault Flowsheet Data (most recent)   IP Adult OT Eval/Treat - 02/28/23 0805    Date of Visit / Treatment  Date of Visit / Treatment 02/28/23   Start Time 0750   End Time 0805   Total Treatment Time 15 minutes    General Information  Subjective agreeable to OT, patient wanting to change positions   General Observations supine in bed, foley, room air, NAD   Precautions/Limitations Fall Precautions;Bed alarm;Chair alarm;Orthostatic   Precautions/Limitations Comment + OH    Weight Bearing Status  Weight Bearing Status WNL - Within normal limits    Vital Signs and Orthostatic Vital Signs  Vital Signs Free text fatigue sitting EOB, BP 101/55 when returned to supine    Pain/Comfort  Pain Comment (Pre/Post Treatment Pain) no pain    Patient Coping  Observed Emotional State accepting   Verbalized Emotional State acceptance   Diversional Activities television    Cognition  Overall Cognitive Status Within Functional Limits   Orientation Level Oriented X4    Skin Assessment  Skin Assessment See Nursing Documentation    Balance  Sitting Balance: Static  FAIR+     Maintains static position without assist or device, may require Supervision or Verbal Cues (>2 minutes)   Sitting Balance: Dynamic  FAIR      Performs dynamic activities through 75% range Contact Guard or partial range (50-75%) with Supervision   Balance Skills Training Comment sitting on EOB < 5 min, CGA, fatigues very easily.    Activities of Daily Living  Activities of Daily Living Comments deferred, patient fatigues at EOB     AM-PAC - Daily Activity IP Short Form  Help needed from another person putting on/taking off regular lower body clothing 2 - A Lot   Help needed from another person for bathing (incl. washing, rinsing, drying) 2 - A Lot   Help needed from another person for toileting (incl. using toilet, bedpan, urinal) 2 - A Lot   Help needed from another person putting on/taking off regular upper body clothing 3 - A Little   Help needed from another person taking care of personal grooming such as brushing teeth 3 - A Little   Help needed from another person eating meals 4 - None   AM-PAC Daily Activity Raw Score (Total of rows above) 16   CMS Score (based on Raw Score - with G Code) 17 - 50.11% impaired      (G Code - CK)    Bed Mobility  Symptoms Noted During/After Treatment fatigue   Supine-to-Sit Independence/Assistance Level Moderate assist   Supine-to-Sit Assist Device Bed rails;Draw pad;Hand held assist   Sit-to-Supine Independence/Assistance Level Minimum assist   Sit-to-Supine Assist Device Bed rails   Bed Mobility Comments fatigue noted at EOB    Handoff Documentation  Handoff Bed alarm;Patient in bed;Discussed with nursing;Patient instructed to call nursing for mobility    Activity Tolerance  Activity Tolerance Comments poor    Therapeutic Functional Activity  Interventions to support occupations Challenged activity tolerance to increase independence with ADLs    Clinical Impression  Follow up Assessment tol OT poorly. Patient has impaired balance, dec activity tol. Fatigues with sitting on EOB. Rec mod complexity   Criteria for Skilled Therapeutic Interventions Met yes;treatment indicated   Rehab Potential good, to achieve stated therapy goals    Patient/Family Stated Goals  Patient/Family Stated Goal(s) get stronger;feel better    Frequency/Equipment Recommendations  OT Frequency 3x per week   Next  Treatment Expected 03/02/23   OT/OTA completing this assessment Laurice Iglesia    OT Recommendations for Inpatient Admission  ADL Recommendations assist of 2;bedside commode;with rolling walker    Planned Treatment / Interventions  Plan for Next Visit progress as tol   Education Treatment / Interventions Patient Education / Training   OT POC OT Discharge Summary  Disposition Recommendation Moderate complexity support and therapy to progress functional mobility/ ADLs/ IADLs recommended for post- acute care.  See assessment for additional details.     Gerald Stabs OTR/L12/31/2024

## 2023-03-01 LAB — BASIC METABOLIC PANEL
BKR ANION GAP: 10 (ref 7–17)
BKR BLOOD UREA NITROGEN: 35 mg/dL — ABNORMAL HIGH (ref 8–23)
BKR BUN / CREAT RATIO: 15.2 (ref 8.0–23.0)
BKR CALCIUM: 8.2 mg/dL — ABNORMAL LOW (ref 8.8–10.2)
BKR CHLORIDE: 105 mmol/L (ref 98–107)
BKR CO2: 22 mmol/L (ref 20–30)
BKR CREATININE DELTA: -0.2
BKR CREATININE: 2.3 mg/dL — ABNORMAL HIGH (ref 0.40–1.30)
BKR EGFR, CREATININE (CKD-EPI 2021): 26 mL/min/{1.73_m2} — ABNORMAL LOW (ref >=60)
BKR GLUCOSE: 135 mg/dL — ABNORMAL HIGH (ref 70–100)
BKR POTASSIUM: 4.5 mmol/L (ref 3.3–5.3)
BKR SODIUM: 137 mmol/L (ref 136–144)

## 2023-03-01 LAB — MAGNESIUM: BKR MAGNESIUM: 1.9 mg/dL (ref 1.7–2.4)

## 2023-03-01 MED ORDER — HEPARIN (PORCINE) 5,000 UNIT/ML INJECTION SOLUTION
5000 | Freq: Three times a day (TID) | SUBCUTANEOUS | Status: DC
Start: 2023-03-01 — End: 2023-03-07
  Administered 2023-03-01 – 2023-03-04 (×2): 5000 mL via SUBCUTANEOUS

## 2023-03-01 NOTE — Plan of Care
 Plan of Care Overview/ Patient Mark [name] is a male. Pt with no pain at this time.Neuro: AOx3CV:not on teleResp: room airGI Last Bowel Movement: 03/01/23. Renal non dialysis diet. Pills whole with waterGU UreTHral Catheter 02/21/23 (Active) Closed system maintained Yes 03/01/23 1000 Securement ** secured to upper leg with leg strap 03/01/23 1000 Bag positioned below bladder Yes 03/01/23 1000 Unobstructive flow of urine Yes 03/01/23 1000 Drainage Method drainage bag to dependent drainage 03/01/23 1000 Indicators present  to keep catheter in place? Chronic catheter documented on admission 03/01/23 1000 Daily Review of Necessity ** completed 03/01/23 1000 Urine Characteristics amber 03/01/23 1000 Daily Catheter Care catheter care provided 03/01/23 1000 Urine Output (mL) 300 03/01/23 0516 Skin: redness on coccyx See skin flowsheet. T+P q2h, wedge in use, no. Skin check performed. Does not have foam dressing to coccyx in place. Pt remains on pressure reduction mattress and heels elevated off bed. Restraints: no. Access Periph IV 02/25/23 1355 dorsum right arm over-the-needle catheter system 22 gauge 1 in length IV Team (Active) SiteCare/Dressing Status/Securement 3.15% Chlorhexadine /70 % alcohol;dressing dry and intact 03/01/23 1000 Date Dressing Applied/Changed 02/25/23 02/25/23 1355 Next Date Dressing Change 03/04/2023 02/25/23 1355 Lumen 1 Patency/Care Patent;flushed w/o difficulty 03/01/23 1000 Site Signs asymptomatic without redness, warmth, swelling, pain, palpable cord, streak formation, drainage, dressing intact 03/01/23 1000 Phlebitis 0-->no symptoms 03/01/23 1000 Infiltration/Extravasation Assessment 0-->no symptoms 03/01/23 1000 Patient Education Instructed to keep IV site dry 03/01/23 1000 Daily Review of Necessity ** completed 03/01/23 1000 Mobility: AMPAC Mobility Score: 9Bed alarm in place, all safety measures implemented.Isolation Precautions: Isolation Precautions: contact precautions maintainedSignificant events from this shift: Ortho static hypotension when sitting up, unable to stand up. BM today.

## 2023-03-01 NOTE — Plan of Care
 Plan of Care Overview/ Patient Status7p-7a: A/O x 4, room air, VSS, no tele, pills whole w/ thin liquids, PIV to right arm, and plan is for STR. Pt has chronic foley, LBM 12/30, and pt is cooperative w/ care.

## 2023-03-01 NOTE — Progress Notes
 [facility] Progress NoteHospital Medicine ServiceAttending Provider: Georgiann Hahn, MD  Location:  [phone] Day #4Subjective   Patient seen and examined on 03/01/2023.CC: Fall, initial encounterInterim History: NAD. States he feels good today. Denies lightheadedness and dizziness today while lying or sitting. Has not attempted to stand today. Denies CP, SOB, ABD pain.   Objective  Vitals:Temp:  [97.3 ?F (36.3 ?C)-98.9 ?F (37.2 ?C)] 98 ?F (36.7 ?C)Pulse:  [71-83] 83Resp:  [16-20] 18BP: (108-129)/(54-62) 110/62SpO2:  [95 %-100 %] 95 %Device (Oxygen Therapy): room airI/O's:Intake/Output Summary (Last 24 hours) at 03/01/2023 1343Last data filed at 03/01/2023 0516Gross per 24 hour Intake -- Output 2150 ml Net -2150 ml  Physical Exam General: NADNeuro: alert and oriented x3HEENT: AT/NCCV: irregular, S1S2Lungs: CTABABD: BS+, soft, nontenderGU: foley in placeExtremities: no edema presentSkin: warm, dryLabs:I have reviewed the patient's pertinent labs as resulted in the EMR.Pending labs toady 1/1/25CBC Last 24hrs:  Lytes Last 24hrs:  Chem Last 24hrs:    Foley Catheter:UreTHral Catheter 02/21/23 (Active) Foley order placed.Imaging:No results found. I reviewed lab results, microbiology, imaging, test results, and EKG in formulating this patient's plan of care.   Assessment: 89 y.o. male PMHx significant for HTN, HLD, CAD, DJD, BPH w/ foley, CKD4, who presented to the ED with a fall while attempting to transfer to toilet and generalized weakness. Found to have hyperkalemia and orthostatic hypotension. Also with AKI on CKD and diarrhea. Was able to stand today with STEDY device but reported weakness with standing for prolonged time   Plan: # fall 2/2 orthostasis; per chart has been ongoing for months# HTN, CAD- CTH c-spine unremarkable- orthos for today pending- holding coreg, lasix, imdur.- losartan d/c; per daughter this was removed from medication list- cw plavix- daily orthos with compression stockings and abd binder- PT recs moderate complexity- Follow up with  Dr Frutoso Chase as outpatient. Discussed patient's clinical status with his office per patient's request# hx aortic aneurysm 5.2cm x 4.5cm- previously seen by Vascular who recommended elective repair. Sought 2nd opinion from Vascular at [place] who did not feel there was increase in size (see [facility]mann note from 10/2022)- currently denies SOB or chest pain- continue outpatient follow up# AKI on CKD 4: BL around 1.7# Hyperkalemia on admission; improved with lokelma- cw sodium bicarb 650mg  TID# BPH# urinary retention with chronic foley# hematuria- hematuria resolved after holding SQ heparin- no blood clots seen; H/H stable- maintain foley- cw flomax# DJD- gabapentin 200mg  QHS# psych Lexapro 5mg  QD  Comorbidities Comorbidities present on admission:Thrombocytopenia noted on admission. 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)Chronic AnemiaSecondary diagnoses occurring during hospitalization:HyperkalemiaHypocalcemiaThrombocytopenia Diet: Diet Renal Non DialysisOral/Enteral Rehydration Fluids (Electrolyte Drink/Water) 480 mLVTE PPx:  heparin (PORCINE) Medication Reconciliation: ED: Unable to Assess Communication: RN, Case Management, and MDDischarge Readiness: Expected Discharge Date: 03/04/23 AM-PAC (RN/PT): 9 / 11 Physical Therapy Disposition Recommendation: Moderate complexity (02/28/23)  Expected discharge location: Short Term Rehab Full CodeI spent 50 minutes today on this encounter before, during and after the visit, reviewing labs and records, evaluating and examining the patient, entering orders and documenting the visit. Signed:Shondrea Steinert Janee Morn, APRN1/1/20251:43 PM

## 2023-03-01 NOTE — Plan of Care
 Plan of Care Overview/ Patient StatusA/O x 4, room air, bedrest, no tele, chronic foley in place, LBM 12/30, pills whole w/ water, cooperative w/ care, plan for STR.

## 2023-03-02 LAB — TSH W/REFLEX TO FT4     (BH GH LMW Q YH): BKR THYROID STIMULATING HORMONE: 1.51 u[IU]/mL

## 2023-03-02 LAB — CORTISOL: BKR CORTISOL, PLASMA: 10 ug/dL

## 2023-03-02 MED ORDER — MIDODRINE 5 MG TABLET
5 mg | Freq: Three times a day (TID) | ORAL | Status: DC
Start: 2023-03-02 — End: 2023-03-06
  Administered 2023-03-02 – 2023-03-06 (×13): 5 mg via ORAL

## 2023-03-02 NOTE — Plan of Care
 Plan of Care Overview/ Patient Mark [name] is a male. Pt with no pain at this time.Neuro: Aox4CV: WDL, denies CPResp: WDL, on RA, denies SOBGI Last Bowel Movement: 01/01/25GU UreTHral Catheter 02/21/23 (Active) Closed system maintained Yes 03/02/23 0745 Securement ** secured to upper leg with adhesive device 03/02/23 0745 Bag positioned below bladder Yes 03/02/23 0745 Unobstructive flow of urine Yes 03/02/23 0745 Drainage Method drainage bag to dependent drainage 03/02/23 0745 Indicators present  to keep catheter in place? Chronic catheter documented on admission 03/02/23 0745 Daily Review of Necessity ** completed 03/02/23 0745 Tolerance no signs/symptoms of discomfort 03/02/23 0745 Urine Characteristics dark;yellow 03/02/23 0745 Daily Catheter Care catheter care provided 03/02/23 0745 Urine Output (mL) 775 03/02/23 0600 Skin:  See skin flowsheet. T+P q2h, wedge in use, yes . Skin check performed. Does not have foam dressing to coccyx in place. Pt remains on pressure reduction mattress and heels elevated off bed. Access  Mobility: AMPAC Mobility Score: 10Bed alarm in place, all safety measures implemented.Isolation Precautions: Isolation Precautions: protective environment maintainedSignificant events from this shift:

## 2023-03-02 NOTE — Plan of Care
 Plan of Care Overview/ Patient Mark [name] is a male. Pt with no pain at this time.Neuro:AAOX4 CV: No telemetry no complaints of chest pain Resp:Room air, no complaints of SOBGI Last Bowel Movement: 01/01/25GU UreTHral Catheter 02/21/23 (Active) Closed system maintained Yes 03/01/23 2000 Securement ** secured to upper leg with adhesive device 03/01/23 2000 Bag positioned below bladder Yes 03/01/23 2000 Unobstructive flow of urine Yes 03/01/23 2000 Drainage Method drainage bag to dependent drainage 03/01/23 2000 Indicators present  to keep catheter in place? Chronic catheter documented on admission 03/01/23 2000 Daily Review of Necessity ** completed 03/01/23 2000 Urine Characteristics amber 03/01/23 1000 Daily Catheter Care catheter care provided 03/01/23 2000 Urine Output (mL) 300 03/01/23 0516 Skin: See skin flowsheet. T+P q2h, wedge in use, yes . Skin check performed. Does not have foam dressing to coccyx in place. Pt remains on pressure reduction mattress and heels elevated off bed. Access  Mobility: AMPAC Mobility Score: 10Bed alarm in place, all safety measures implemented.Isolation Precautions: Isolation Precautions: protective environment maintainedSignificant events from this shift:  Patient resting in bed comfortably. BP slightly low, with DBP in the 50's. Patient asymptomatic, MAP above 65. Patient refusing heparin injections, no hematuria noted in foley. Problem: Fall Injury RiskGoal: Absence of Fall and Fall-Related InjuryOutcome: Interventions implemented as appropriate Problem: Skin Injury Risk IncreasedGoal: Skin Health and IntegrityOutcome: Interventions implemented as appropriate Problem: WoundGoal: Optimal CopingOutcome: Interventions implemented as appropriateGoal: Optimal Functional AbilityOutcome: Interventions implemented as appropriateGoal: Absence of Infection Signs and SymptomsOutcome: Interventions implemented as appropriateGoal: Improved Oral IntakeOutcome: Interventions implemented as appropriate

## 2023-03-02 NOTE — Progress Notes
 [facility] Medicine Progress NoteAttending Provider: Lucien Mons, MD Subjective                                                                              Subjective: Interim History: patient resting in bed when seen today. Concerned about possible need for STR again as he would prefer to go home. He denied any complaints of dizziness or chest discomfort.Review of Allergies/Meds/Hx: Review of Allergies/Meds/Hx:I have reviewed the patient's: allergies, current scheduled medications, current infusions, current prn medications, past medical history, and past surgical history Objective Objective: Vitals:I have reviewed the patient's current vital signs as documented in the patient's EMR.   and Last 24 hours: Temp:  [97.9 ?F (36.6 ?C)-98.2 ?F (36.8 ?C)] 98.2 ?F (36.8 ?C)Pulse:  [66-77] 71Resp:  [16-20] 16BP: (110-133)/(53-74) 112/59SpO2:  [96 %-99 %] 99 %I/O's:I have reviewed the patient's current I&O's as documented in the EMR.Gross Totals (Last 24 hours) at 03/02/2023 1452Last data filed at 03/02/2023 0600Intake 790 ml Output 775 ml Net 15 ml Procedures:None Physical ExamConstitutional:     Appearance: Normal appearance. HENT:    Head: Normocephalic. Cardiovascular:    Rate and Rhythm: Normal rate.    Pulses: Normal pulses.    Heart sounds: Murmur heard. Pulmonary:    Effort: Pulmonary effort is normal.    Breath sounds: Normal breath sounds. Abdominal:    General: Bowel sounds are normal. Musculoskeletal:    Right lower leg: No edema.    Left lower leg: No edema. Neurological:    General: No focal deficit present.    Mental Status: He is alert and oriented to person, place, and time. Mental status is at baseline.  Labs:I have reviewed the patient's labs within the last 24 hrs.Last 24 hours: Recent Results (from the past 24 hours) TSH w/reflex to FT4  Collection Time: 03/02/23  7:17 AM Result Value Ref Range  Thyroid Stimulating Hormone 1.510 See Comment ?IU/mL Cortisol  Collection Time: 03/02/23  7:17 AM Result Value Ref Range  Cortisol 10.0 See Comment  ug/dL Diagnostics:I have reviewed the patient's Radiology report(s) within the last 48 hrs. Significant findings are below.STUDY: [place] HEAD CERVICAL SPINE WO IV CONTRAST ([facility]) dated 02/24/2023 6:35 PMFINDINGS:Brain:There is no intracranial hemorrhage, edema, mass, mass effect or midline shift. There is no evidence of acute major vascular distribution infarct. Scattered periventricular and subcortical white matter hypodensities are nonspecific, but likely represent sequelae of chronic small vessel ischemic disease. There is ventricular and sulcal prominence likely secondary to generalized parenchymal volume loss. The basal cisterns are patent.  The paranasal sinuses and mastoid air cells are well-aerated. Patient is status post bilateral lens replacement. The visualized orbits and osseous structures are otherwise unremarkable. Cervical Spine:There is no compression deformity or evidence for acute fracture or subluxation. The atlanto-occipital and atlanto-axial articulations are intact. Note is made of multilevel degenerative disc disease changes, most prominent at C5-C6. The prevertebral soft tissues are within normal limits. The visualized lung apices are clear. Findings were confirmed on the coronal and sagittal reformatted images of the cervical spine. IMPRESSION:1. No evidence of acute intracranial hemorrhage or major vascular distribution infarct.  2. No evidence for acute cervical spine fracture or traumatic subluxation.ECG/Tele  Events: No ECG ordered today Assessment Assessment: Assessment:Mark [name] is a 89 year old male with PMH of hypertension, CAD, hyperlipidemia, BPH with foley catheter, AAA (follows with vascular), CKD stage IV who presented to the hospital after a fall and generalized weakness. Found to have hyperkalemia and orthostatic hypotension.  Plan Plan: Orthostatic hypotension- BP meds remain on hold- lasix, coreg, and imdur- BP remains low today- denied any dizziness when sitting up for orthostatic vitals - continue with compression stocking and abdominal binder- started on midodrine 5mg  tid- repeat orthostatic bp in am- recent echo this past aug with EF of 65-70% with mild-mod aortic regurg/aortic stenosis- hold flomax as potential cause of orthostatic hypotension- foley already in place- will need close follow up with Dr. Frutoso Chase on dischargeWeakness- PT assessed patient at moderate complexity- nursing reporting patient unsteady when attempted to get up with vital- AM cortisol 10, and TSH 1.510Comorbidities Comorbidities present on admission:Thrombocytopenia noted on admission. 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)- creatinine appears stable- continue bicarb 650mg  tidChronic Anemia- appears close to baseline- likely due to CKDDepression- continue lexapro 5mg  dailyCAD- continue plavix, and asa daily- continue crestor dailySecondary diagnoses occurring during hospitalization:HyperkalemiaHypocalcemiaThrombocytopenia Diet Renal Non DialysisOral/Enteral Rehydration Fluids (Electrolyte Drink/Water) 480 mLVTE PPx: Heparin SQ Medication Reconciliation: Completed: by Pharmacist Communication: RNDischarge Readiness: Expected Discharge Date: 03/06/23  Expected discharge location: Short Term Rehab Full CodeMedical decision making for this patient was moderate.I spent 35 minutes today on this encounter before, during and after the visit, reviewing labs and records, evaluating and examining the patient, entering orders and documenting the visit. Electronically Signed:Beckham Buxbaum D'Amico, NP [facility] [phone]/04/2023, 2:44 PM

## 2023-03-03 LAB — BASIC METABOLIC PANEL
BKR ANION GAP: 10 (ref 7–17)
BKR BLOOD UREA NITROGEN: 33 mg/dL — ABNORMAL HIGH (ref 8–23)
BKR BUN / CREAT RATIO: 13.8 (ref 8.0–23.0)
BKR CALCIUM: 8.6 mg/dL — ABNORMAL LOW (ref 8.8–10.2)
BKR CHLORIDE: 106 mmol/L (ref 98–107)
BKR CO2: 22 mmol/L (ref 20–30)
BKR CREATININE DELTA: 0.1
BKR CREATININE: 2.4 mg/dL — ABNORMAL HIGH (ref 0.40–1.30)
BKR EGFR, CREATININE (CKD-EPI 2021): 24 mL/min/{1.73_m2} — ABNORMAL LOW (ref >=60)
BKR GLUCOSE: 112 mg/dL — ABNORMAL HIGH (ref 70–100)
BKR POTASSIUM: 4.2 mmol/L (ref 3.3–5.3)
BKR SODIUM: 138 mmol/L (ref 136–144)

## 2023-03-03 LAB — CBC WITHOUT DIFFERENTIAL
BKR WAM ANC (ABSOLUTE NEUTROPHIL COUNT): 2.71 x 1000/ÂµL (ref 2.00–7.60)
BKR WAM HEMATOCRIT (2 DEC): 28 % — ABNORMAL LOW (ref 38.50–50.00)
BKR WAM HEMOGLOBIN: 8.4 g/dL — ABNORMAL LOW (ref 13.2–17.1)
BKR WAM MCH (PG): 23.1 pg — ABNORMAL LOW (ref 27.0–33.0)
BKR WAM MCHC: 30 g/dL — ABNORMAL LOW (ref 31.0–36.0)
BKR WAM MCV: 77.1 fL — ABNORMAL LOW (ref 80.0–100.0)
BKR WAM MPV: 9.8 fL (ref 8.0–12.0)
BKR WAM PLATELETS: 163 x1000/ÂµL (ref 150–420)
BKR WAM RDW-CV: 16.2 % — ABNORMAL HIGH (ref 11.0–15.0)
BKR WAM RED BLOOD CELL COUNT.: 3.63 M/ÂµL — ABNORMAL LOW (ref 4.00–6.00)
BKR WAM WHITE BLOOD CELL COUNT: 5.1 x1000/ÂµL (ref 4.0–11.0)

## 2023-03-03 MED ORDER — SODIUM CHLORIDE 0.9 % INTRAVENOUS SOLUTION
INTRAVENOUS | Status: AC
Start: 2023-03-03 — End: ?
  Administered 2023-03-03: 17:00:00 via INTRAVENOUS

## 2023-03-03 NOTE — Plan of Care
 Plan of Care Overview/ Patient Mark [name] is a male. Pt with no pain at this time.Neuro: Alert, oriented x4. Hearing impairedCV: No Telemetry order. No complaints of chest pain. Resp: Room air, no complaints of SOB. GI Last Bowel Movement: 01/01/25GU UreTHral Catheter 02/21/23 (Active) Closed system maintained Yes 03/02/23 2010 Securement ** secured to upper leg with adhesive device 03/02/23 2010 Bag positioned below bladder Yes 03/02/23 2010 Unobstructive flow of urine Yes 03/02/23 2010 Drainage Method drainage bag to dependent drainage 03/02/23 2010 Indicators present  to keep catheter in place? Chronic catheter documented on admission 03/02/23 2010 Daily Review of Necessity ** completed 03/02/23 2010 Tolerance no signs/symptoms of discomfort 03/02/23 2010 Urine Characteristics yellow 03/02/23 2010 Daily Catheter Care catheter care provided 03/02/23 2010 Urine Output (mL) 950 03/02/23 2200 Skin: See skin flowsheet. T+P q2h, pillows in use, yes. Patient able to turn himself independently  Skin check performed. Does not have foam dressing to coccyx in place. Pt remains on pressure reduction mattress and heels elevated off bed. Access  Mobility: AMPAC Mobility Score: 10Bed alarm in place, all safety measures implemented.Isolation Precautions: Isolation Precautions: protective environment maintainedSignificant events from this shift: No acute changes during the night . Patient continues to refuse sq heparin injections. Will continue to monitor. Problem: Fall Injury RiskGoal: Absence of Fall and Fall-Related Injury1/04/2023 0457 by Newton Pigg, RNOutcome: Interventions implemented as appropriate1/04/2023 0132 by Newton Pigg, RNOutcome: Interventions implemented as appropriate Problem: Skin Injury Risk IncreasedGoal: Skin Health and Integrity1/04/2023 0457 by Newton Pigg, RNOutcome: Interventions implemented as appropriate1/04/2023 0132 by Newton Pigg, RNOutcome: Interventions implemented as appropriate Problem: WoundGoal: Optimal Coping1/04/2023 0457 by Newton Pigg, RNOutcome: Interventions implemented as appropriate1/04/2023 0132 by Newton Pigg, RNOutcome: Interventions implemented as appropriateGoal: Optimal Functional Ability1/04/2023 0457 by Newton Pigg, RNOutcome: Interventions implemented as appropriate1/04/2023 0132 by Newton Pigg, RNOutcome: Interventions implemented as appropriateGoal: Absence of Infection Signs and Symptoms1/04/2023 0457 by Newton Pigg, RNOutcome: Interventions implemented as appropriate1/04/2023 0132 by Newton Pigg, RNOutcome: Interventions implemented as appropriateGoal: Improved Oral Intake1/04/2023 0457 by Newton Pigg, RNOutcome: Interventions implemented as appropriate1/04/2023 0132 by Newton Pigg, RNOutcome: Interventions implemented as appropriateGoal: Optimal Pain Control and FunctionOutcome: Interventions implemented as appropriate

## 2023-03-03 NOTE — Plan of Care
 Plan of Care Overview/ Patient StatusProblem: Fall Injury RiskGoal: Absence of Fall and Fall-Related InjuryOutcome: Interventions implemented as appropriate Problem: Skin Injury Risk IncreasedGoal: Skin Health and IntegrityOutcome: Interventions implemented as appropriate Problem: WoundGoal: Optimal CopingOutcome: Interventions implemented as appropriateGoal: Optimal Functional AbilityOutcome: Interventions implemented as appropriateGoal: Absence of Infection Signs and SymptomsOutcome: Interventions implemented as appropriateGoal: Improved Oral IntakeOutcome: Interventions implemented as appropriate

## 2023-03-03 NOTE — Progress Notes
 [facility] Progress NoteAttending Provider: Lucien Mons, MD Subjective                                                                              Subjective: Interim History: patient sitting up in the chair when seen today. He reported no dizziness when he was standing with PT today.Review of Allergies/Meds/Hx: Review of Allergies/Meds/Hx:I have reviewed the patient's: allergies, current scheduled medications, current infusions, current prn medications, past medical history, and past surgical history Objective Objective: Vitals:I have reviewed the patient's current vital signs as documented in the patient's EMR.   and Last 24 hours: Temp:  [97.6 ?F (36.4 ?C)-97.8 ?F (36.6 ?C)] 97.6 ?F (36.4 ?C)Pulse:  [68-78] 68Resp:  [16-18] 18BP: (116-125)/(55-61) 116/61SpO2:  [97 %-100 %] 98 %I/O's:I have reviewed the patient's current I&O's as documented in the EMR.Gross Totals (Last 24 hours) at 03/03/2023 1140Last data filed at 03/03/2023 0600Intake 980 ml Output 1750 ml Net -770 ml Procedures:None Physical ExamConstitutional:     Appearance: Normal appearance. HENT:    Head: Normocephalic. Cardiovascular:    Rate and Rhythm: Normal rate.    Pulses: Normal pulses.    Heart sounds: Murmur heard. Pulmonary:    Effort: Pulmonary effort is normal.    Breath sounds: Normal breath sounds. Abdominal:    General: Bowel sounds are normal. Musculoskeletal:    Right lower leg: No edema.    Left lower leg: No edema. Neurological:    General: No focal deficit present.    Mental Status: He is alert and oriented to person, place, and time. Mental status is at baseline.  Labs:I have reviewed the patient's labs within the last 24 hrs.Last 24 hours: Recent Results (from the past 24 hours) CBC without differential  Collection Time: 03/03/23  8:43 AM Result Value Ref Range  WBC 5.1 4.0 - 11.0 x1000/?L  RBC 3.63 (L) 4.00 - 6.00 M/?L  Hemoglobin 8.4 (L) 13.2 - 17.1 g/dL  Hematocrit 09.81 (L) 19.14 - 50.00 %  MCV 77.1 (L) 80.0 - 100.0 fL  MCH 23.1 (L) 27.0 - 33.0 pg  MCHC 30.0 (L) 31.0 - 36.0 g/dL  RDW-CV 78.2 (H) 95.6 - 15.0 %  Platelets 163 150 - 420 x1000/?L  MPV 9.8 8.0 - 12.0 fL  ANC (Abs Neutrophil Count) 2.71 2.00 - 7.60 x 1000/?L Basic metabolic panel  Collection Time: 03/03/23  8:43 AM Result Value Ref Range  Sodium 138 136 - 144 mmol/L  Potassium 4.2 3.3 - 5.3 mmol/L  Chloride 106 98 - 107 mmol/L  CO2 22 20 - 30 mmol/L  Anion Gap 10 7 - 17  Glucose 112 (H) 70 - 100 mg/dL  BUN 33 (H) 8 - 23 mg/dL  Creatinine 2.13 (H) 0.86 - 1.30 mg/dL  Calcium 8.6 (L) 8.8 - 10.2 mg/dL  BUN/Creatinine Ratio 57.8 8.0 - 23.0  eGFR (Creatinine) 24 (L) >=60 mL/min/1.62m2  Creatinine Delta 0.1 See Comment Diagnostics:I have reviewed the patient's Radiology report(s) within the last 48 hrs. Significant findings are below.STUDY: [place] HEAD CERVICAL SPINE WO IV CONTRAST ([facility]) dated 02/24/2023 6:35 PMFINDINGS:Brain:There is no intracranial hemorrhage, edema, mass, mass effect or midline shift. There is no evidence of acute major  vascular distribution infarct. Scattered periventricular and subcortical white matter hypodensities are nonspecific, but likely represent sequelae of chronic small vessel ischemic disease. There is ventricular and sulcal prominence likely secondary to generalized parenchymal volume loss. The basal cisterns are patent.  The paranasal sinuses and mastoid air cells are well-aerated. Patient is status post bilateral lens replacement. The visualized orbits and osseous structures are otherwise unremarkable. Cervical Spine:There is no compression deformity or evidence for acute fracture or subluxation. The atlanto-occipital and atlanto-axial articulations are intact. Note is made of multilevel degenerative disc disease changes, most prominent at C5-C6. The prevertebral soft tissues are within normal limits. The visualized lung apices are clear. Findings were confirmed on the coronal and sagittal reformatted images of the cervical spine. IMPRESSION:1. No evidence of acute intracranial hemorrhage or major vascular distribution infarct.  2. No evidence for acute cervical spine fracture or traumatic subluxation.ECG/Tele Events: No ECG ordered today Assessment Assessment: Assessment:Mark [name] is a 89 year old male with PMH of hypertension, CAD, hyperlipidemia, BPH with foley catheter, AAA (follows with vascular), CKD stage IV who presented to the hospital after a fall and generalized weakness. Found to have hyperkalemia and orthostatic hypotension.  Plan Plan: Orthostatic hypotension- BP meds remain on hold- lasix, coreg, and imdur- orthostatic vitals positive today however they were taking right after midodrine was administer before onset would occur- continue with compression stocking and abdominal binder- continue midodrine 5mg  tid- will give IV fluids today- repeat orthostatic bp in am- recent echo this past aug with EF of 65-70% with mild-mod aortic regurg/aortic stenosis- hold flomax as potential cause of orthostatic hypotension- foley already in place- will need close follow up with Dr. Frutoso Chase on dischargeWeakness- PT assessed patient at moderate complexity- nursing reporting patient unsteady when attempted to get up with vital- AM cortisol 10, and TSH 1.510Comorbidities Comorbidities present on admission:Thrombocytopenia noted on admission. 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)- creatinine appears stable- continue bicarb 650mg  tidChronic Anemia- appears close to baseline- likely due to CKDDepression- continue lexapro 5mg  dailyCAD- continue plavix, and asa daily- continue crestor dailySecondary diagnoses occurring during hospitalization:HyperkalemiaHypocalcemiaThrombocytopenia Diet Renal Non DialysisOral/Enteral Rehydration Fluids (Electrolyte Drink/Water) 480 mLVTE PPx: Heparin SQ Medication Reconciliation: Completed: by Pharmacist Communication: RNDischarge Readiness: Expected Discharge Date: 03/06/23  Expected discharge location: Short Term Rehab Full CodeMedical decision making for this patient was moderate.I spent 35 minutes today on this encounter before, during and after the visit, reviewing labs and records, evaluating and examining the patient, entering orders and documenting the visit. Electronically Signed:Doretha Goding D'Amico, NP [facility] [phone]/04/2023, 11:42 AM

## 2023-03-03 NOTE — Plan of Care
 Inpatient Physical Therapy Progress Note IP Adult PT Eval/Treat - 03/03/23 0935    Date of Visit / Treatment  Date of Visit / Treatment 03/03/23   Note Type Daily Note   Start Time 910   End Time 935   Total Treatment Time 25    General Information  Subjective No dizziness   General Observations supine in bed, foley, NAD   Precautions/Limitations Fall Precautions;Bed alarm;Chair alarm;Orthostatic   Precautions/Limitations Comment HOH    Vital Signs and Orthostatic Vital Signs  Vital Signs Free text RA, asymptomatic entire session despite + orthostatics   Lying Orthostatic BP 111/64   Sitting Orthostatic BP 96/54   Standing Orthostatic BP 73/51     Pain/Comfort  Pain Comment (Pre/Post Treatment Pain) no c/o pain    Cognition  Overall Cognitive Status Within Functional Limits   Cognition Comments pleasant and cooperative    Skin Assessment  Skin Assessment See Nursing Documentation    Balance  Sitting Balance: Static  FAIR+     Maintains static position without assist or device, may require Supervision or Verbal Cues (>2 minutes)   Sitting Balance: Dynamic  FAIR      Performs dynamic activities through 75% range Contact Guard or partial range (50-75%) with Supervision   Standing Balance: Static FAIR-      Contact Guard to maintain static position with no Assistive Device   Standing Balance: Dynamic  POOR+   Moves through 1/2 range with minimal assist to right self   Balance Assist Device Rolling walker    Bed Mobility  Supine-to-Sit Independence/Assistance Level Contact guard;Assist of 1;Verbal cues   Supine-to-Sit Assist Device Head of bed elevated   Bed Mobility, Impairments strength decreased    Sit-Stand Transfer Training  Symptoms Noted During/After Treatment (Sit-to-Stand Transfer Training) significant change in vital signs   Sit-to-Stand Transfer Independence/Assistance Level Minimum assist;Assist of 2;Verbal cues Sit-to-Stand Transfer Assist Device Rolling walker   Transfer Safety Analysis Impairments impaired balance;impaired postural control;decreased strength   Sit-Stand Transfer Comments asymptomatic despite hypotension    Gait Training  Independence/Assistance Level  Minimum assist;Assist of 2;Verbal cues   Assistive Device  Rolling walker   Gait Distance 5 feet;bed to chair   Gait Analysis Deviations increased time in double stance;decreased toe-to-floor clearance;decreased weight-shifting ability   Gait Analysis Impairments impaired balance;impaired postural control;decreased strength   Gait Training Comments forward flexed, cuing for safety    Handoff Documentation  Handoff Patient in chair;Chair alarm;Pressure relief cushion;Patient instructed to call nursing for mobility;Discussed with nursing    PT- AM-PAC - Basic Mobility Screen- How much help from another person do you currently need.....  Turning from your back to your side while in a a flat bed without using rails? 3 - A Little - Requires a little help (supervision, minimal assistance). Can use assistive devices.   Moving from lying on your back to sitting on the side of a flat bed without using bed rails? 3 - A Little - Requires a little help (supervision, minimal assistance). Can use assistive devices.   Moving to and from a bed to a chair (including a wheelchair)? 2 - A Lot - Requires a lot of help (maximum to moderate assistance). Can use assistive devices.   Standing up from a chair using your arms(e.g., wheelchair or bedside chair)? 2 - A Lot - Requires a lot of help (maximum to moderate assistance). Can use assistive devices.   To walk in a hospital room? 1 - Total - Requires total assistance  or cannot do it at all.   Climbing 3-5 steps with a railing? 1 - Total - Requires total assistance or cannot do it at all.   AMPAC Mobility Score 12   TARGET Highest Level of Mobility Mobility Level 4, Transfer to chair   ACTUAL Highest Level of Mobility Mobility Level 4, Transfer to chair    Clinical Impression  Follow up Assessment Good tolerance, patient continues to have +orthostatics but was asymptomatic and able to mobilize OOB to recliner with RW and assist of two persons.  Also limited by weakness, impaired balance, and decr endurance.  Continue to recommend moderate complexity support at discharge   Criteria for Skilled Therapeutic Interventions Met yes   Rehab Potential good, to achieve stated therapy goals    Patient/Family Stated Goals  Patient/Family Stated Goal(s) get stronger    Frequency/Equipment Recommendations  PT Frequency 3x per week   Next Treatment Expected 03/06/23   PT/PTA completing this assessment dani   Equipment Needs During Admission/Treatment Rolling walker    PT Recommendations for Inpatient Admission  Activity/Level of Assist transfers only;assist of 2;with rolling walker    PT Discharge Summary  Physical Therapy Disposition Recommendation Moderate complexity support and therapy to progress functional mobility/ ADLs/ IADLs recommended for post- acute care.  See assessment for additional details.     Jen Mow, DPT

## 2023-03-03 NOTE — Progress Notes
 Inpatient Occupational Therapy Progress NoteDefault Flowsheet Data (most recent)   IP Adult OT Eval/Treat - 03/03/23 1025    Date of Visit / Treatment  Date of Visit / Treatment 03/03/23   Start Time 1000   End Time 1025   Total Treatment Time 25 min    General Information  Subjective patient agreeable to OT   General Observations patient supine in bed, foley, NAD   Precautions/Limitations Fall Precautions;Chair alarm;Bed alarm;Orthostatic   Precautions/Limitations Comment HOH    Weight Bearing Status  Weight Bearing Status WNL - Within normal limits    Vital Signs and Orthostatic Vital Signs  Vital Signs Vital Signs Stable   Vital Signs Free text room air, no dizzines   Lying Orthostatic BP 111/64   Sitting Orthostatic BP 96/54   Standing Orthostatic BP 73/51     Pain/Comfort  Pain Comment (Pre/Post Treatment Pain) no pain    Patient Coping  Observed Emotional State accepting   Verbalized Emotional State acceptance   Diversional Activities smartphone    Cognition  Overall Cognitive Status Within Functional Limits   Orientation Level Oriented X4    Skin Assessment  Skin Assessment See Nursing Documentation    Balance  Sitting Balance: Static  FAIR+     Maintains static position without assist or device, may require Supervision or Verbal Cues (>2 minutes)   Sitting Balance: Dynamic  FAIR      Performs dynamic activities through 75% range Contact Guard or partial range (50-75%) with Supervision   Standing Balance: Static FAIR-      Contact Guard to maintain static position with no Assistive Device   Standing Balance: Dynamic  POOR+   Moves through 1/2 range with minimal assist to right self   Balance Assist Device Rolling walker   Balance Skills Training Comment A x 2    ADL:  Upper Body Bathing  Independence/Assistance Level Set-up required   Assistive Device No device   Upper Body Bathing Comments sitting on EOB    ADL: Upper Body Dressing  Upper Body Dressing Comments assisted to changejohnny on EOB    ADL: Lower Body Dressing  Independence/Assistance Level Total assist/dependent   Assistive Device No device   Clothing Items hospital socks   Independence limited by Weakness;Decreased activity tolerance;Impaired balance    ADL: Grooming  Independence/Assistance Level Set-up required   Assistive Device No device   Grooming Activities wash face;wash hands   Independence limited by Impaired balance;Decreased activity tolerance   Grooming Comments sitting     AM-PAC - Daily Activity IP Short Form  Help needed from another person putting on/taking off regular lower body clothing 2 - A Lot   Help needed from another person for bathing (incl. washing, rinsing, drying) 2 - A Lot   Help needed from another person for toileting (incl. using toilet, bedpan, urinal) 2 - A Lot   Help needed from another person putting on/taking off regular upper body clothing 3 - A Little   Help needed from another person taking care of personal grooming such as brushing teeth 3 - A Little   Help needed from another person eating meals 4 - None   AM-PAC Daily Activity Raw Score (Total of rows above) 16   CMS Score (based on Raw Score - with G Code) 16 - 53.32% impaired      (G Code - CK)    Bed Mobility  Supine-to-Sit Independence/Assistance Level Contact guard   Supine-to-Sit Assist Device Head of bed elevated  Sit-Stand Transfer Training  Symptoms Noted During/After Treatment Marketing executive) significant change in vital signs   Sit-to-Stand Transfer Independence/Assistance Level Minimum assist;Assist of 2   Sit-to-Stand Transfer Assist Device Rolling walker   Stand-to-Sit Transfer Independence/Assistance Level Minimum assist;Assist of 2   Stand-to-Sit Transfer Assist Device Rolling walker   Transfer Safety Analysis Concerns cues for hand placement Transfer Safety Analysis Impairments impaired balance;decreased strength;decreased endurance/activity tolerance   Occupation-Based Indications: Sit to Stand Transfers To perform grooming tasks standing;To complete toilet/commode transfers;To simulate the use of grab bars in bathroom   Sit-Stand Transfer Comments asymptomatic despite hypotension    Functional Ambulation  Independence/Assistance Level  Minimum assist;Assist of 2   Assistive Device  Rolling walker   Gait Distance 5 feet;bed to chair   Gait Analysis Impairments impaired balance;decreased endurance/activity tolerance;decreased strength   Occupation-based Indications:  Functional Ambulation In preparation for mobility to bathroom for toileting tasks;In preparation for bathing and showering tasks;In preparation to complete grooming tasks    Handoff Documentation  Handoff Chair alarm;Patient in chair;Patient instructed to call nursing for mobility;Discussed with nursing    Activity Tolerance  Activity Tolerance Comments poor    Therapeutic Functional Activity  Interventions to support occupations Challenged activity tolerance to increase independence with ADLs;Engaged in balance tasks to improve ability to complete ADL tasks;Engaged in functional transfers in preparation for ADL tasks    Clinical Impression  Follow up Assessment tol OT well, no dizziness. OOB with A x2. Patient has impiared balance, activity tol, hypotension. rec mod complexity   Criteria for Skilled Therapeutic Interventions Met yes;treatment indicated   Rehab Potential good, to achieve stated therapy goals    Patient/Family Stated Goals  Patient/Family Stated Goal(s) feel better;get stronger    Frequency/Equipment Recommendations  OT Frequency 3x per week   Next Treatment Expected 03/06/23   OT/OTA completing this assessment Anyiah Coverdale    OT Recommendations for Inpatient Admission  ADL Recommendations assist of 2;bedside commode;with rolling walker    Planned Treatment / Interventions  Plan for Next Visit progress as tol   Education Treatment / Interventions Patient Education / Training   OT POC   OT Discharge Summary  Disposition Recommendation Moderate complexity support and therapy to progress functional mobility/ ADLs/ IADLs recommended for post- acute care.  See assessment for additional details.     Gerald Stabs OTR/L1/04/2023

## 2023-03-04 LAB — BASIC METABOLIC PANEL
BKR ANION GAP: 12 (ref 7–17)
BKR BLOOD UREA NITROGEN: 31 mg/dL — ABNORMAL HIGH (ref 8–23)
BKR BUN / CREAT RATIO: 14.8 (ref 8.0–23.0)
BKR CALCIUM: 8.3 mg/dL — ABNORMAL LOW (ref 8.8–10.2)
BKR CHLORIDE: 105 mmol/L (ref 98–107)
BKR CO2: 21 mmol/L (ref 20–30)
BKR CREATININE DELTA: -0.3
BKR CREATININE: 2.1 mg/dL — ABNORMAL HIGH (ref 0.40–1.30)
BKR EGFR, CREATININE (CKD-EPI 2021): 28 mL/min/{1.73_m2} — ABNORMAL LOW (ref >=60)
BKR GLUCOSE: 93 mg/dL (ref 70–100)
BKR POTASSIUM: 4.7 mmol/L (ref 3.3–5.3)
BKR SODIUM: 138 mmol/L (ref 136–144)

## 2023-03-04 LAB — CBC WITHOUT DIFFERENTIAL
BKR WAM ANC (ABSOLUTE NEUTROPHIL COUNT): 3.01 x 1000/ÂµL (ref 2.00–7.60)
BKR WAM HEMATOCRIT (2 DEC): 27.1 % — ABNORMAL LOW (ref 38.50–50.00)
BKR WAM HEMOGLOBIN: 8.2 g/dL — ABNORMAL LOW (ref 13.2–17.1)
BKR WAM MCH (PG): 23.3 pg — ABNORMAL LOW (ref 27.0–33.0)
BKR WAM MCHC: 30.3 g/dL — ABNORMAL LOW (ref 31.0–36.0)
BKR WAM MCV: 77 fL — ABNORMAL LOW (ref 80.0–100.0)
BKR WAM MPV: 9.4 fL (ref 8.0–12.0)
BKR WAM PLATELETS: 158 x1000/ÂµL (ref 150–420)
BKR WAM RDW-CV: 16.1 % — ABNORMAL HIGH (ref 11.0–15.0)
BKR WAM RED BLOOD CELL COUNT.: 3.52 M/ÂµL — ABNORMAL LOW (ref 4.00–6.00)
BKR WAM WHITE BLOOD CELL COUNT: 5.6 x1000/ÂµL (ref 4.0–11.0)

## 2023-03-04 NOTE — Plan of Care
 Plan of Care Overview/ Patient Mark [name] is a male. Pt with no pain at this time.Neuro:alert and oreiented x 4CV:no s/s of cardiac distress, Resp:no s/s of respiratory distress, on room airGI Last Bowel Movement: 01/04/25GU UreTHral Catheter 02/21/23 (Active) Closed system maintained Yes 03/04/23 0844 Securement ** secured to upper leg with adhesive device 03/04/23 0844 Bag positioned below bladder Yes 03/04/23 0844 Unobstructive flow of urine Yes 03/04/23 0844 Drainage Method drainage bag to dependent drainage 03/04/23 0844 Indicators present  to keep catheter in place? Chronic catheter documented on admission 03/04/23 0844 Daily Review of Necessity ** completed 03/04/23 0844 Tolerance no signs/symptoms of discomfort 03/04/23 0844 Foreskin circumcised 03/04/23 0844 Urine Characteristics yellow 03/04/23 0844 Daily Catheter Care catheter care provided 03/04/23 0844 Urine Output (mL) 1200 03/04/23 0600 Catheter irrigation done? No 03/03/23 2034 Skin:  See skin flowsheet. T+P q2h, wedge in use, no. Skin check performed. Does not have foam dressing to coccyx in place. Pt remains on pressure reduction mattress and heels elevated off bed. Restraints: noneAccess Periph IV 03/03/23 1138 dorsum right arm over-the-needle catheter system 20 gauge 1 in length IV Team (Active) SiteCare/Dressing Status/Securement dressing dry and intact 03/04/23 0844 Date Dressing Applied/Changed 03/03/23 03/03/23 2034 Next Date Dressing Change 03/10/2023 03/03/23 2034 Lumen 1 Patency/Care Patent;flushed w/o difficulty 03/03/23 2034 Site Signs asymptomatic without redness, warmth, swelling, pain, palpable cord, streak formation, drainage, dressing intact 03/03/23 2034 Phlebitis 0-->no symptoms 03/04/23 0844 Infiltration/Extravasation Assessment 0-->no symptoms 03/04/23 0844 Extravasation Type irritant 03/04/23 0844 Patient Education Instructed to keep IV site dry;Instructed to call nurse if site is painful,red,swollen, burning;Instructed to call nurse if fluid leaking from site 03/04/23 0844 Daily Review of Necessity ** completed 03/04/23 0844 Mobility: AMPAC Mobility Score: 12, patient in ned due to episodes of orthostatic hypotensionBed alarm in place, all safety measures implemented. Isolation Precautions: protective environment maintainedSignificant events from this shift: Problem: Skin Injury Risk IncreasedGoal: Skin Health and IntegrityOutcome: Interventions implemented as appropriate Problem: WoundGoal: Optimal CopingOutcome: Interventions implemented as appropriateGoal: Improved Oral IntakeOutcome: Interventions implemented as appropriateGoal: Skin Health and IntegrityOutcome: Interventions implemented as appropriate

## 2023-03-04 NOTE — Progress Notes
 [facility] Medicine Progress NoteAttending Provider: Lucien Mons, MD Subjective                                                                              Subjective: Interim History: patient resting in bed when seen today. Denied any complaints.Review of Allergies/Meds/Hx: Review of Allergies/Meds/Hx:I have reviewed the patient's: allergies, current scheduled medications, current infusions, current prn medications, past medical history, and past surgical history Objective Objective: Vitals:I have reviewed the patient's current vital signs as documented in the patient's EMR.   and Last 24 hours: Temp:  [97.2 ?F (36.2 ?C)-98 ?F (36.7 ?C)] 97.2 ?F (36.2 ?C)Pulse:  [73-105] 74Resp:  [16-22] 22BP: (92-138)/(49-70) 127/66SpO2:  [91 %-100 %] 98 %I/O's:I have reviewed the patient's current I&O's as documented in the EMR.Gross Totals (Last 24 hours) at 03/04/2023 1248Last data filed at 03/04/2023 0600Intake 200 ml Output 2350 ml Net -2150 ml Procedures:None Physical ExamConstitutional:     Appearance: Normal appearance. HENT:    Head: Normocephalic. Cardiovascular:    Rate and Rhythm: Normal rate.    Pulses: Normal pulses.    Heart sounds: Murmur heard. Pulmonary:    Effort: Pulmonary effort is normal.    Breath sounds: Normal breath sounds. Abdominal:    General: Bowel sounds are normal. Musculoskeletal:    Right lower leg: No edema.    Left lower leg: No edema. Neurological:    General: No focal deficit present.    Mental Status: He is alert and oriented to person, place, and time. Mental status is at baseline.  Labs:I have reviewed the patient's labs within the last 24 hrs.Last 24 hours: No results found for this or any previous visit (from the past 24 hours).Diagnostics:I have reviewed the patient's Radiology report(s) within the last 48 hrs. Significant findings are below.STUDY: [place] HEAD CERVICAL SPINE WO IV CONTRAST ([facility]) dated 02/24/2023 6:35 PMFINDINGS:Brain:There is no intracranial hemorrhage, edema, mass, mass effect or midline shift. There is no evidence of acute major vascular distribution infarct. Scattered periventricular and subcortical white matter hypodensities are nonspecific, but likely represent sequelae of chronic small vessel ischemic disease. There is ventricular and sulcal prominence likely secondary to generalized parenchymal volume loss. The basal cisterns are patent.  The paranasal sinuses and mastoid air cells are well-aerated. Patient is status post bilateral lens replacement. The visualized orbits and osseous structures are otherwise unremarkable. Cervical Spine:There is no compression deformity or evidence for acute fracture or subluxation. The atlanto-occipital and atlanto-axial articulations are intact. Note is made of multilevel degenerative disc disease changes, most prominent at C5-C6. The prevertebral soft tissues are within normal limits. The visualized lung apices are clear. Findings were confirmed on the coronal and sagittal reformatted images of the cervical spine. IMPRESSION:1. No evidence of acute intracranial hemorrhage or major vascular distribution infarct.  2. No evidence for acute cervical spine fracture or traumatic subluxation.ECG/Tele Events: No ECG ordered today Assessment Assessment: Assessment:Mark [name] is a 89 year old male with PMH of hypertension, CAD, hyperlipidemia, BPH with foley catheter, AAA (follows with vascular), CKD stage IV who presented to the hospital after a fall and generalized weakness. Found to have hyperkalemia and orthostatic hypotension.  Plan Plan: Orthostatic hypotension- BP  meds remain on hold- lasix, coreg, and imdur- orthostatic vitals improved yesterday evening after IV fluids and midodrine- continue with compression stocking and abdominal binder- continue midodrine 5mg  tid- recent echo this past aug with EF of 65-70% with mild-mod aortic regurg/aortic stenosis- hold flomax as potential cause of orthostatic hypotension- foley already in place- will need close follow up with Dr. Frutoso Chase on dischargeWeakness- PT assessed patient at moderate complexity- plan for STR now awaiting auth- AM cortisol 10, and TSH 1.510Comorbidities Comorbidities present on admission:Thrombocytopenia noted on admission. 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)- creatinine appears stable- continue bicarb 650mg  tidChronic Anemia- appears close to baseline- likely due to CKDDepression- continue lexapro 5mg  dailyCAD- continue plavix, and asa daily- continue crestor dailySecondary diagnoses occurring during hospitalization:HyperkalemiaHypocalcemiaThrombocytopenia Diet Renal Non DialysisOral/Enteral Rehydration Fluids (Electrolyte Drink/Water) 480 mLVTE PPx: Heparin SQ Medication Reconciliation: Completed: by Pharmacist Communication: RNDischarge Readiness: Expected Discharge Date: 03/04/23  Expected discharge location: [facility] Full CodeMedical decision making for this patient was moderate.I spent 35 minutes today on this encounter before, during and after the visit, reviewing labs and records, evaluating and examining the patient, entering orders and documenting the visit. Electronically Signed:Roux Brandy D'Amico, NP [facility] [phone]/05/2023, 12:52 PM

## 2023-03-04 NOTE — Plan of Care
 Plan of Care Overview/ Patient Status Pt is a male. Pt with no pain at this time.Neuro: Alert, oriented x4. CV: No orders for telemetry, no complaints of chest painResp: Room air, no complaints of SOB GI : LBM:1/1/25GU UreTHral Catheter 02/21/23 (Active) Closed system maintained Yes 03/03/23 2034 Securement ** secured to upper leg with adhesive device 03/03/23 2034 Bag positioned below bladder Yes 03/03/23 2034 Unobstructive flow of urine Yes 03/03/23 2034 Drainage Method drainage bag to dependent drainage 03/03/23 2034 Indicators present  to keep catheter in place? Chronic catheter documented on admission 03/03/23 2034 Daily Review of Necessity ** completed 03/03/23 2034 Tolerance no signs/symptoms of discomfort 03/03/23 2034 Foreskin circumcised 03/03/23 1000 Urine Characteristics yellow;cloudy 03/03/23 2034 Daily Catheter Care catheter care provided 03/03/23 2034 Urine Output (mL) 350 03/03/23 2034 Catheter irrigation done? No 03/03/23 2034 Skin: See skin flowsheet. T+P q2h, pillows in use, yes . Skin check performed. Does not have foam dressing to coccyx in place. Pt remains on pressure reduction mattress and heels elevated off bed. Access Periph IV 03/03/23 1138 dorsum right arm over-the-needle catheter system 20 gauge 1 in length IV Team (Active) SiteCare/Dressing Status/Securement 3.15% Chlorhexadine /70 % alcohol 03/03/23 2034 Date Dressing Applied/Changed 03/03/23 03/03/23 2034 Next Date Dressing Change 03/10/2023 03/03/23 2034 Lumen 1 Patency/Care Patent;flushed w/o difficulty 03/03/23 2034 Site Signs asymptomatic without redness, warmth, swelling, pain, palpable cord, streak formation, drainage, dressing intact 03/03/23 2034 Phlebitis 0-->no symptoms 03/03/23 2034 Infiltration/Extravasation Assessment 0-->no symptoms 03/03/23 2034 Patient Education Instructed to keep IV site dry;Instructed to call nurse if site is painful,red,swollen, burning;Instructed to call nurse if fluid leaking from site 03/03/23 2034 Daily Review of Necessity ** completed 03/03/23 2034 Mobility: AMPAC Mobility Score: 12Bed alarm in place, all safety measures implemented.Isolation Precautions: Isolation Precautions: protective environment maintainedSignificant events from this shift: n/a Problem: Fall Injury RiskGoal: Absence of Fall and Fall-Related InjuryOutcome: Interventions implemented as appropriate Problem: Skin Injury Risk IncreasedGoal: Skin Health and IntegrityOutcome: Interventions implemented as appropriate Problem: WoundGoal: Optimal CopingOutcome: Interventions implemented as appropriateGoal: Optimal Functional AbilityOutcome: Interventions implemented as appropriateGoal: Absence of Infection Signs and SymptomsOutcome: Interventions implemented as appropriateGoal: Improved Oral IntakeOutcome: Interventions implemented as appropriate

## 2023-03-04 NOTE — Care Coordination-Inpatient
 PENDING INSURANCE AUTHORIZATION THIS PATIENT CANNOT BE DISCHARGED UNTIL DETERMINATION OBTAINEDSecured Facility: [name] has been initiated with this pt's insurer. Clinical information submitted to this patient's insurer for review today via [facility] portal. We now await the authorization that will allow this patient to transfer to the facility.    Insurance Contact: [facility] TransitionsPhone: [phone]: [phone] No.: [id number]

## 2023-03-04 NOTE — Plan of Care
 Plan of Care Overview/ Patient Mark [name] is a male. Pt with no pain at this time.Neuro: A & O x 4. CV: Asymptomatic hypotension.Resp: WDL.GI Last Bowel Movement: 01/01/25GU UreTHral Catheter 02/21/23 (Active) Closed system maintained Yes 03/03/23 1000 Securement ** secured to abdomen with adhesive device 03/03/23 1000 Bag positioned below bladder Yes 03/03/23 1000 Unobstructive flow of urine Yes 03/03/23 1000 Drainage Method drainage bag to dependent drainage 03/03/23 1000 Indicators present  to keep catheter in place? Chronic catheter documented on admission 03/03/23 1000 Daily Review of Necessity ** completed 03/03/23 1000 Tolerance no signs/symptoms of discomfort 03/03/23 1000 Foreskin circumcised 03/03/23 1000 Urine Characteristics yellow 03/03/23 1000 Daily Catheter Care catheter care provided 03/03/23 1000 Urine Output (mL) 800 03/03/23 0600 Skin: intact. See skin flowsheet. Pt remains on pressure reduction mattress and heels elevated off bed. Access Periph IV 03/03/23 1138 dorsum right arm over-the-needle catheter system 20 gauge 1 in length IV Team (Active) SiteCare/Dressing Status/Securement 3.15% Chlorhexadine /70 % alcohol;transparent semipermeable dressing applied;catheter securement device utilized 03/03/23 1138 Date Dressing Applied/Changed 03/03/23 03/03/23 1138 Next Date Dressing Change 03/10/2023 03/03/23 1138 Lumen 1 Patency/Care flushed w/o difficulty;Patent 03/03/23 1200 Site Signs asymptomatic without redness, warmth, swelling, pain, palpable cord, streak formation, drainage, dressing intact 03/03/23 1200 Phlebitis 0-->no symptoms 03/03/23 1200 Infiltration/Extravasation Assessment 0-->no symptoms 03/03/23 1200 Patient Education Instructed to keep IV site dry;Instructed to call nurse if site is painful,red,swollen, burning;Instructed to call nurse if fluid leaking from site 03/03/23 1200 Daily Review of Necessity ** completed 03/03/23 1200 Mobility: AMPAC Mobility Score: 12Bed & chair alarm in place, all safety measures implemented.Significant events from this shift: Severe orthostasis from sitting to standing position. Pt. asymptomatic--transferred into the [facility] w/ PT. Midodrine administered immediately prior to transfer. 1200p BP 124/69--Midodrine given as ordered. IV NS 0.9% infusion initiated @ 1200p after IV insertion. Lasix, Coreg, Isosorbide & Flomax on hold. 1830p: Asymptomatic orthostasis noted w/ position change.

## 2023-03-05 LAB — BASIC METABOLIC PANEL
BKR ANION GAP: 10 (ref 7–17)
BKR BLOOD UREA NITROGEN: 31 mg/dL — ABNORMAL HIGH (ref 8–23)
BKR BUN / CREAT RATIO: 12.9 (ref 8.0–23.0)
BKR CALCIUM: 8.5 mg/dL — ABNORMAL LOW (ref 8.8–10.2)
BKR CHLORIDE: 107 mmol/L (ref 98–107)
BKR CO2: 21 mmol/L (ref 20–30)
BKR CREATININE DELTA: 0.3
BKR CREATININE: 2.4 mg/dL — ABNORMAL HIGH (ref 0.40–1.30)
BKR EGFR, CREATININE (CKD-EPI 2021): 24 mL/min/{1.73_m2} — ABNORMAL LOW (ref >=60)
BKR GLUCOSE: 101 mg/dL — ABNORMAL HIGH (ref 70–100)
BKR POTASSIUM: 5 mmol/L (ref 3.3–5.3)
BKR SODIUM: 138 mmol/L (ref 136–144)

## 2023-03-05 NOTE — Plan of Care
 Plan of Care Overview/ Patient StatusProblem: Adult Inpatient Plan of CareGoal: Readiness for Transition of CareOutcome: Interventions implemented as appropriateCM spoke w/ daughter and confirmed pt will be returning home after STR; she just wants him to have some good therapy and get stronger before he returns home.Daughter declined [place] but accepting of the following:[facility] REHABELIM [email or web address]: DTR accepted [facility] bed offer for Sunday. Will Request Auth (   ); PASSR approved - Ass ID	[id number] Joycie Peek, BSN, RNCare Coordinator/ManagerCase Management DepartmentMain Office - [phone]

## 2023-03-05 NOTE — Plan of Care
 Plan of Care Overview/ Patient Mark [name] is a male. Pt with moderate pain , lidocaine patch applied, back rub given with some relief.Neuro: A&O x 4,CV: no s/s of cardiac distress, positive orthostatic hypotension, patient asymptomaticResp: on room air, no s/s of respiratory distressGI Last Bowel Movement: 01/04/25GU UreTHral Catheter 02/21/23 (Active) Closed system maintained Yes 03/05/23 0900 Securement ** secured to upper leg with adhesive device 03/05/23 0900 Bag positioned below bladder Yes 03/05/23 0900 Unobstructive flow of urine Yes 03/05/23 0900 Drainage Method drainage bag to dependent drainage 03/05/23 0900 Indicators present  to keep catheter in place? Chronic catheter documented on admission 03/05/23 0900 Daily Review of Necessity ** completed 03/05/23 0900 Tolerance no signs/symptoms of discomfort 03/05/23 0900 Foreskin circumcised 03/05/23 0900 Urine Characteristics yellow 03/05/23 0900 Daily Catheter Care catheter care provided 03/05/23 0900 Urine Output (mL) 400 03/04/23 2000 Catheter irrigation done? No 03/03/23 2034 Skin:  See skin flowsheet. Encouraged T+P q2h, wedge in use, no. Skin check performed. Does not have foam dressing to coccyx in place. Pt remains on pressure reduction mattress and heels elevated off bed. Restraints: Access Periph IV 03/03/23 1138 dorsum right arm over-the-needle catheter system 20 gauge 1 in length IV Team (Active) SiteCare/Dressing Status/Securement dressing dry and intact 03/05/23 0900 Date Dressing Applied/Changed 03/03/23 03/03/23 2034 Next Date Dressing Change 03/10/2023 03/03/23 2034 Lumen 1 Patency/Care Patent;flushed w/o difficulty 03/03/23 2034 Site Signs asymptomatic without redness, warmth, swelling, pain, palpable cord, streak formation, drainage, dressing intact 03/05/23 0900 Phlebitis 0-->no symptoms 03/05/23 0900 Infiltration/Extravasation Assessment 0-->no symptoms 03/05/23 0900 Extravasation Type irritant 03/04/23 0844 Patient Education Instructed to keep IV site dry;Instructed to call nurse if site is painful,red,swollen, burning;Instructed to call nurse if fluid leaking from site 03/05/23 0900 Daily Review of Necessity ** completed 03/05/23 0900 Mobility: AMPAC Mobility Score: 12Bed alarm in place, all safety measures implemented.Isolation Precautions: Isolation Precautions: protective environment maintainedSignificant events from this shift:

## 2023-03-05 NOTE — Plan of Care
 Plan of Care Overview/ Patient Mark [name] is a male. Pt with no pain at this time.Neuro: AAOX4, no acute signs of distress CV: No orders for telemetry, no chest pain Resp: Room air, no complaints of sob GI Last Bowel Movement: 01/04/25GU UreTHral Catheter 02/21/23 (Active) Closed system maintained Yes 03/04/23 2000 Securement ** secured to upper leg with adhesive device 03/04/23 2000 Bag positioned below bladder Yes 03/04/23 2000 Unobstructive flow of urine Yes 03/04/23 2000 Drainage Method drainage bag to dependent drainage 03/04/23 2000 Indicators present  to keep catheter in place? Chronic catheter documented on admission 03/04/23 2000 Daily Review of Necessity ** completed 03/04/23 2000 Tolerance no signs/symptoms of discomfort 03/04/23 2000 Foreskin circumcised 03/04/23 0844 Urine Characteristics yellow 03/04/23 2000 Daily Catheter Care catheter care provided 03/04/23 2000 Urine Output (mL) 400 03/04/23 2000 Catheter irrigation done? No 03/03/23 2034 Skin:See skin flowsheet. T+P q2h, wedge in use, no. Skin check performed. Does not have foam dressing to coccyx in place. Pt remains on pressure reduction mattress and heels elevated off bed. Access Periph IV 03/03/23 1138 dorsum right arm over-the-needle catheter system 20 gauge 1 in length IV Team (Active) SiteCare/Dressing Status/Securement dressing dry and intact 03/04/23 0844 Date Dressing Applied/Changed 03/03/23 03/03/23 2034 Next Date Dressing Change 03/10/2023 03/03/23 2034 Lumen 1 Patency/Care Patent;flushed w/o difficulty 03/03/23 2034 Site Signs asymptomatic without redness, warmth, swelling, pain, palpable cord, streak formation, drainage, dressing intact 03/03/23 2034 Phlebitis 0-->no symptoms 03/04/23 0844 Infiltration/Extravasation Assessment 0-->no symptoms 03/04/23 0844 Extravasation Type irritant 03/04/23 0844 Patient Education Instructed to keep IV site dry;Instructed to call nurse if site is painful,red,swollen, burning;Instructed to call nurse if fluid leaking from site 03/04/23 0844 Daily Review of Necessity ** completed 03/04/23 0844 Mobility: AMPAC Mobility Score: 12Bed alarm in place, all safety measures implemented.Isolation Precautions: Isolation Precautions: protective environment maintainedSignificant events from this shift: no acute changes during the night.

## 2023-03-05 NOTE — Progress Notes
 [facility] Progress NoteAttending Provider: Lucien Mons, MD Subjective                                                                              Subjective: Interim History: patient sitting up in bed watching TV when seen today. He denied any complaints.Review of Allergies/Meds/Hx: Review of Allergies/Meds/Hx:I have reviewed the patient's: allergies, current scheduled medications, current infusions, current prn medications, past medical history, and past surgical history Objective Objective: Vitals:I have reviewed the patient's current vital signs as documented in the patient's EMR.   and Last 24 hours: Temp:  [97.5 ?F (36.4 ?C)-98.3 ?F (36.8 ?C)] 98.3 ?F (36.8 ?C)Pulse:  [66-83] 66Resp:  [18-20] 19BP: (105-121)/(54-65) 105/60SpO2:  [96 %-99 %] 96 %I/O's:I have reviewed the patient's current I&O's as documented in the EMR.Gross Totals (Last 24 hours) at 03/05/2023 1150Last data filed at 03/04/2023 2200Intake 540 ml Output 1050 ml Net -510 ml Procedures:None Physical ExamConstitutional:     Appearance: Normal appearance. HENT:    Head: Normocephalic. Cardiovascular:    Rate and Rhythm: Normal rate.    Pulses: Normal pulses.    Heart sounds: Murmur heard. Pulmonary:    Effort: Pulmonary effort is normal.    Breath sounds: Normal breath sounds. Abdominal:    General: Bowel sounds are normal. Musculoskeletal:    Right lower leg: No edema.    Left lower leg: No edema. Neurological:    General: No focal deficit present.    Mental Status: He is alert and oriented to person, place, and time. Mental status is at baseline.  Labs:I have reviewed the patient's labs within the last 24 hrs.Last 24 hours: Recent Results (from the past 24 hours) CBC without differential  Collection Time: 03/04/23 12:20 PM Result Value Ref Range  WBC 5.6 4.0 - 11.0 x1000/?L  RBC 3.52 (L) 4.00 - 6.00 M/?L  Hemoglobin 8.2 (L) 13.2 - 17.1 g/dL  Hematocrit 84.13 (L) 24.40 - 50.00 %  MCV 77.0 (L) 80.0 - 100.0 fL  MCH 23.3 (L) 27.0 - 33.0 pg  MCHC 30.3 (L) 31.0 - 36.0 g/dL  RDW-CV 10.2 (H) 72.5 - 15.0 %  Platelets 158 150 - 420 x1000/?L  MPV 9.4 8.0 - 12.0 fL  ANC (Abs Neutrophil Count) 3.01 2.00 - 7.60 x 1000/?L Basic metabolic panel  Collection Time: 03/04/23 12:20 PM Result Value Ref Range  Sodium 138 136 - 144 mmol/L  Potassium 4.7 3.3 - 5.3 mmol/L  Chloride 105 98 - 107 mmol/L  CO2 21 20 - 30 mmol/L  Anion Gap 12 7 - 17  Glucose 93 70 - 100 mg/dL  BUN 31 (H) 8 - 23 mg/dL  Creatinine 3.66 (H) 4.40 - 1.30 mg/dL  Calcium 8.3 (L) 8.8 - 10.2 mg/dL  BUN/Creatinine Ratio 34.7 8.0 - 23.0  eGFR (Creatinine) 28 (L) >=60 mL/min/1.37m2  Creatinine Delta -0.3 See Comment Basic metabolic panel  Collection Time: 03/05/23  7:57 AM Result Value Ref Range  Sodium 138 136 - 144 mmol/L  Potassium 5.0 3.3 - 5.3 mmol/L  Chloride 107 98 - 107 mmol/L  CO2 21 20 - 30 mmol/L  Anion Gap 10 7 - 17  Glucose 101 (H) 70 - 100 mg/dL  BUN 31 (H) 8 - 23 mg/dL  Creatinine 1.02 (H) 7.25 - 1.30 mg/dL  Calcium 8.5 (L) 8.8 - 10.2 mg/dL  BUN/Creatinine Ratio 36.6 8.0 - 23.0  eGFR (Creatinine) 24 (L) >=60 mL/min/1.45m2  Creatinine Delta 0.3 See Comment Diagnostics:I have reviewed the patient's Radiology report(s) within the last 48 hrs. Significant findings are below.STUDY: [place] HEAD CERVICAL SPINE WO IV CONTRAST ([facility]) dated 02/24/2023 6:35 PMFINDINGS:Brain:There is no intracranial hemorrhage, edema, mass, mass effect or midline shift. There is no evidence of acute major vascular distribution infarct. Scattered periventricular and subcortical white matter hypodensities are nonspecific, but likely represent sequelae of chronic small vessel ischemic disease. There is ventricular and sulcal prominence likely secondary to generalized parenchymal volume loss. The basal cisterns are patent.  The paranasal sinuses and mastoid air cells are well-aerated. Patient is status post bilateral lens replacement. The visualized orbits and osseous structures are otherwise unremarkable. Cervical Spine:There is no compression deformity or evidence for acute fracture or subluxation. The atlanto-occipital and atlanto-axial articulations are intact. Note is made of multilevel degenerative disc disease changes, most prominent at C5-C6. The prevertebral soft tissues are within normal limits. The visualized lung apices are clear. Findings were confirmed on the coronal and sagittal reformatted images of the cervical spine. IMPRESSION:1. No evidence of acute intracranial hemorrhage or major vascular distribution infarct.  2. No evidence for acute cervical spine fracture or traumatic subluxation.ECG/Tele Events: No ECG ordered today Assessment Assessment: Assessment:Mark [name] is a 89 year old male with PMH of hypertension, CAD, hyperlipidemia, BPH with foley catheter, AAA (follows with vascular), CKD stage IV who presented to the hospital after a fall and generalized weakness. Found to have hyperkalemia and orthostatic hypotension.  Plan Plan: Orthostatic hypotension- BP meds remain on hold- lasix, coreg, and imdur- continue with compression stocking and abdominal binder- continue midodrine 5mg  tid- recent echo this past aug with EF of 65-70% with mild-mod aortic regurg/aortic stenosis- hold flomax as potential cause of orthostatic hypotension- foley already in place- will need close follow up with Dr. Frutoso Chase on discharge- would repeat orthostatic vitals todayWeakness- PT assessed patient at moderate complexity- plan for STR now awaiting auth- AM cortisol 10, and TSH 1.510Comorbidities Comorbidities present on admission:Thrombocytopenia noted on admission. 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)- creatinine appears stable- continue bicarb 650mg  tidChronic Anemia- appears close to baseline- likely due to CKDDepression- continue lexapro 5mg  dailyCAD- continue plavix, and asa daily- continue crestor dailySecondary diagnoses occurring during hospitalization:HyperkalemiaHypocalcemiaThrombocytopenia Diet Renal Non DialysisOral/Enteral Rehydration Fluids (Electrolyte Drink/Water) 480 mLVTE PPx: Heparin SQ Medication Reconciliation: Completed: by Pharmacist Communication: RN, daughter updated over the phoneDischarge Readiness: Expected Discharge Date: 03/05/23  Expected discharge location: Short Term Rehab- medically ready for dc when Auth obtained Full CodeMedical decision making for this patient was moderate.I spent 35 minutes today on this encounter before, during and after the visit, reviewing labs and records, evaluating and examining the patient, entering orders and documenting the visit. Electronically Signed:Rickey Sadowski [place], NP [facility] [phone]/06/2023, 11:51 AM

## 2023-03-06 ENCOUNTER — Inpatient Hospital Stay: Admit: 2023-03-06 | Payer: PRIVATE HEALTH INSURANCE

## 2023-03-06 MED ORDER — ESCITALOPRAM 10 MG TABLET
10 | Freq: Every day | ORAL | Status: AC
Start: 2023-03-06 — End: ?
  Filled 2023-03-07: qty 14, 14d supply, fill #0

## 2023-03-06 MED ORDER — ESCITALOPRAM 10 MG TABLET
10 | Freq: Every day | ORAL | Status: DC
Start: 2023-03-06 — End: 2023-03-07

## 2023-03-06 MED ORDER — MIDODRINE 2.5 MG TABLET
2.5 | Freq: Three times a day (TID) | ORAL | Status: AC
Start: 2023-03-06 — End: ?
  Filled 2023-03-07: qty 9, 3d supply, fill #0

## 2023-03-06 MED ORDER — GABAPENTIN 100 MG CAPSULE
100 | Freq: Every day | ORAL | Status: DC
Start: 2023-03-06 — End: 2023-03-07

## 2023-03-06 MED ORDER — SODIUM BICARBONATE 650 MG TABLET
650 | Freq: Three times a day (TID) | ORAL | Status: AC
Start: 2023-03-06 — End: ?

## 2023-03-06 MED ORDER — PERFLUTREN LIPID MICROSPHERES 1.3 ML/10 ML NS INJ (IN
Freq: Once | INTRAVENOUS | Status: DC | PRN
Start: 2023-03-06 — End: 2023-03-06

## 2023-03-06 MED ORDER — GABAPENTIN 100 MG CAPSULE
100 | Freq: Two times a day (BID) | ORAL | Status: AC
Start: 2023-03-06 — End: ?

## 2023-03-06 MED ORDER — MIDODRINE 2.5 MG TABLET
2.5 | Freq: Three times a day (TID) | ORAL | Status: DC
Start: 2023-03-06 — End: 2023-03-07
  Administered 2023-03-06: 23:00:00 2.5 mg via ORAL

## 2023-03-06 NOTE — Plan of Care
 Plan of Care Overview/ Patient StatusProblem: Adult Inpatient Plan of CareGoal: Readiness for Transition of CareOutcome: Outcome(s) achievedPatient is medically ready and is discharge today to STR  at [facility]. He will be transported via [employer].CM called and spoke to daughter [name] who is in agreement with discharge today to the [facility].BA to set transportationNurse to call report Nurse to Fax W-10Case Management Plan  Flowsheet Row Most Recent Value Discharge Planning  Patient/Patient Representative goals/treatment preferences for discharge are:  SNF Patient/Patient Representative was presented with a list of facilities, agencies and/or dme providers and Referral(s) placed for: Short term rehabilitation (at a nursing facility) Facility/Geographical Preference(s) [place] Bed has been secured and is available on: 03/06/23 Facility Name [facility]onna Buckler [facility] Mode of Transportation  Ambulance (add comment for special considerations) Transportation scheduled for (date)  03/06/23 Transportation is scheduled as a will call? yes Transportation Company [facility] CM D/C Readiness  PASRR completed and approved Yes Authorization number obtained, if required Yes Is there a 3 day INPATIENT Qualifying stay for Medicare Patients? Yes Medicare IM- signed, dated, timed and scanned, if required --  [Tasked to TC] DME Authorized/Delivered N/A No needs identified/ follow up with PCP/MD N/A Post acute care services secured W10 complete Yes Pri Completed and Accepted  N/A Is the destination address correct on the W10 Yes Finalized Plan  Expected Discharge Date 03/06/23  Cherie Dark RN,BSN,CCMCase ManagerYNHH Case Management Department

## 2023-03-06 NOTE — Care Coordination-Inpatient
 INSURANCE AUTH OBTAINED FOR STR 03/04/23 1020 Authorization Information Date Authorization Initiated:  03/04/23 Time Authorization Initiated: 1020 Mode Clinical was sent: Portal Facility Name  Life Insurance Authorization yes Insurance Company [facility] Medicare Insurance Co. Contact Name/# [facility] - phone: [phone] fax: [phone] Facility Authorization #/Details Auth #: [id number], 3 days,  for admission to [facility] 1/5-03/06/22; Date Authorization recieved 03/06/23 Time Authorization recieved 1437 Follow up contact necessary Yes Contact Name [employer] Contact phone: [phone] Insurance Co. Fax number: [phone]
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
## 2023-03-06 NOTE — Plan of Care
 Plan of Care Overview/ Patient Mark [name] is a male. Pt with no pain at this time.Neuro: AAOX4 	CV: No orders for Telemetry, no complaints of chest pain Resp: Room Air, no complaints of SOB GI Last Bowel Movement: 01/04/25GU UreTHral Catheter 02/21/23 (Active) Closed system maintained Yes 03/05/23 2008 Securement ** secured to upper leg with adhesive device 03/05/23 2008 Bag positioned below bladder Yes 03/05/23 2008 Unobstructive flow of urine Yes 03/05/23 2008 Drainage Method drainage bag to dependent drainage 03/05/23 2008 Indicators present  to keep catheter in place? Chronic catheter documented on admission 03/05/23 2008 Daily Review of Necessity ** completed 03/05/23 2008 Tolerance no signs/symptoms of discomfort 03/05/23 2008 Foreskin circumcised 03/05/23 2008 Urine Characteristics yellow 03/05/23 2008 Daily Catheter Care catheter care provided 03/05/23 2008 Urine Output (mL) 1275 03/05/23 1800 Catheter irrigation done? No 03/05/23 2008 Skin: See skin flowsheet. T+P q2h, pillows in use, yes . Skin check performed. Does not have foam dressing to coccyx in place. Pt remains on pressure reduction mattress and heels elevated off bed. Access Periph IV 03/03/23 1138 dorsum right arm over-the-needle catheter system 20 gauge 1 in length IV Team (Active) SiteCare/Dressing Status/Securement dressing dry and intact 03/05/23 2008 Date Dressing Applied/Changed 03/03/23 03/05/23 2008 Next Date Dressing Change 03/10/2023 03/05/23 2008 Lumen 1 Patency/Care Patent;flushed w/o difficulty 03/05/23 2008 Site Signs asymptomatic without redness, warmth, swelling, pain, palpable cord, streak formation, drainage, dressing intact 03/05/23 2008 Phlebitis 0-->no symptoms 03/05/23 2008 Infiltration/Extravasation Assessment 0-->no symptoms 03/05/23 2008 Extravasation Type irritant 03/04/23 0844 Patient Education Instructed to keep IV site dry;Instructed to call nurse if site is painful,red,swollen, burning;Instructed to call nurse if fluid leaking from site 03/05/23 2008 Daily Review of Necessity ** completed 03/05/23 2008 Mobility: AMPAC Mobility Score: 12Bed alarm in place, all safety measures implemented.Isolation Precautions: Isolation Precautions: protective environment maintainedSignificant events from this shift: n/aProblem: Fall Injury RiskGoal: Absence of Fall and Fall-Related InjuryOutcome: Interventions implemented as appropriate Problem: Skin Injury Risk IncreasedGoal: Skin Health and IntegrityOutcome: Interventions implemented as appropriate Problem: WoundGoal: Optimal CopingOutcome: Interventions implemented as appropriateGoal: Optimal Functional AbilityOutcome: Interventions implemented as appropriateGoal: Absence of Infection Signs and SymptomsOutcome: Interventions implemented as appropriateGoal: Improved Oral IntakeOutcome: Interventions implemented as appropriateGoal: Optimal Pain Control and FunctionOutcome: Interventions implemented as appropriateGoal: Skin Health and IntegrityOutcome: Interventions implemented as appropriate

## 2023-03-06 NOTE — Plan of Care
 Mark [name] was discharged via [facility]) accompanied by Alone.  Verbalized understanding of discharge instructionsand recommended follow up care as per the after visit summary.  Written discharge instructions provided. Denies any further questions. Vital signs    Vitals:  03/05/23 2240 03/06/23 0010 03/06/23 0821 03/06/23 1609 BP: (!) 120/52 (!) 126/57 133/65 122/61 Pulse: 73 74 67 68 Resp: 18 20 18 19  Temp: 98 ?F (36.7 ?C) 97.9 ?F (36.6 ?C) 97.5 ?F (36.4 ?C) 98.5 ?F (36.9 ?C) TempSrc: Oral Oral Oral Oral SpO2: 99% 98% 98% 99% Weight:     Height:     Belongings charted in last 7 days: Patient Valuables   Patient Valuables Flowsheet                    PATIENT VALUABLE(S)       Clothing Disposition At bedside/locker/closet 02/26/23 1516  Jewelry disposition At bedside/locker/closet 02/26/23 1516          IV removed. Discharge education provided. Plan of Care Overview/ Patient Mark [name] is a male. Pt with no pain at this time.Neuro: Alert and oriented x4. CV: No tele. Denies CP. Resp: On Ra. Denies SOB. GI Last Bowel Movement: 01/04/25GU UreTHral Catheter 02/21/23 (Active) Closed system maintained Yes 03/05/23 2008 Securement ** secured to upper leg with adhesive device 03/06/23 1305 Bag positioned below bladder Yes 03/05/23 2008 Unobstructive flow of urine Yes 03/05/23 2008 Drainage Method drainage bag to dependent drainage 03/05/23 2008 Indicators present  to keep catheter in place? Chronic catheter documented on admission 03/05/23 2008 Daily Review of Necessity ** completed 03/05/23 2008 Tolerance no signs/symptoms of discomfort 03/05/23 2008 Foreskin circumcised 03/05/23 2008 Urine Characteristics yellow 03/05/23 2008 Daily Catheter Care catheter care provided 03/06/23 1305 Urine Output (mL) 750 03/06/23 0532 Catheter irrigation done? No 03/05/23 2008 Skin: See skin flowsheet. T+P q2h, wedge in use, no. Skin check performed. Does not have foam dressing to coccyx in place. Pt remains on pressure reduction mattress and heels elevated off bed. Restraints:N/AAccess Periph IV 03/03/23 1138 dorsum right arm over-the-needle catheter system 20 gauge 1 in length IV Team (Active) SiteCare/Dressing Status/Securement dressing dry and intact 03/05/23 2008 Date Dressing Applied/Changed 03/03/23 03/05/23 2008 Next Date Dressing Change 03/10/2023 03/05/23 2008 Lumen 1 Patency/Care Patent;flushed w/o difficulty 03/05/23 2008 Site Signs asymptomatic without redness, warmth, swelling, pain, palpable cord, streak formation, drainage, dressing intact 03/05/23 2008 Phlebitis 0-->no symptoms 03/05/23 2008 Infiltration/Extravasation Assessment 0-->no symptoms 03/05/23 2008 Extravasation Type irritant 03/04/23 0844 Patient Education Instructed to keep IV site dry;Instructed to call nurse if site is painful,red,swollen, burning;Instructed to call nurse if fluid leaking from site 03/05/23 2008 Daily Review of Necessity ** completed 03/05/23 2008 Mobility: AMPAC Mobility Score: 12Bed alarm in place, all safety measures implemented.Isolation Precautions: Isolation Precautions: protective environment maintainedSignificant events from this shift:N/AElectronically Signed by Lessie Dings, RN, March 06, 2023

## 2023-03-06 NOTE — Progress Notes
 [facility] Progress NoteHospital Medicine ServiceAttending Provider: Cephus Shelling, MD  Location:  [phone] Day #9Subjective   Patient seen and examined on 03/06/2023.CC: Fall, initial encounterInterim History: Patient is medically stable, pleasant. Just waiting for insurance auth.   Objective  Vitals:Temp:  [97.5 ?F (36.4 ?C)-98 ?F (36.7 ?C)] 97.5 ?F (36.4 ?C)Pulse:  [67-88] 67Resp:  [18-20] 18BP: (108-133)/(52-65) 133/65SpO2:  [98 %-99 %] 98 %Device (Oxygen Therapy): room airI/O's:Intake/Output Summary (Last 24 hours) at 03/06/2023 1148Last data filed at 03/06/2023 0532Gross per 24 hour Intake 200 ml Output 3300 ml Net -3100 ml  Physical ExamConstitutional:     Appearance: Normal appearance. HENT:    Head: Normocephalic. Cardiovascular:    Rate and Rhythm: Normal rate.    Pulses: Normal pulses.    Heart sounds: Murmur heard. Pulmonary:    Effort: Pulmonary effort is normal.    Breath sounds: Normal breath sounds. Abdominal:    General: Bowel sounds are normal. Musculoskeletal:    Right lower leg: No edema.    Left lower leg: No edema. Neurological:    General: No focal deficit present.    Mental Status: He is alert and oriented to person, place, and time. Mental status is at baseline.  Labs:I have reviewed the patient's pertinent labs as resulted in the EMR.CBC Last 24hrs:  Lytes Last 24hrs:  Chem Last 24hrs:  Coags Last 24hrs:  Peripheral Access:Periph IV 03/03/23 1138 dorsum right arm over-the-needle catheter system 20 gauge 1 in length IV Team (Active)  Foley Catheter:UreTHral Catheter 02/21/23 (Active) Foley order placed.Imaging:No results found. I reviewed lab results, microbiology, imaging, and test results in formulating this patient's plan of care.   Assessment: 89 y.o. male PMHx hypertension, CAD, hyperlipidemia, BPH with foley catheter, AAA (follows with vascular), CKD stage IV who presented to the hospital after a fall and generalized weakness. Found to have hyperkalemia and orthostatic hypotension. Now stable, pending auth for STR.Plan: Orthostatic hypotension- BP meds remain on hold- lasix, coreg, and imdur- continue with compression stocking and abdominal binder- midodrine dec to 2.5mg  TID - recent echo this past aug with EF of 65-70% with mild-mod aortic regurg/aortic stenosis- hold flomax as potential cause of orthostatic hypotension- foley already in place- will need close follow up with Dr. Frutoso Chase on discharge- TSH, cortisol normal- echo re-ordered for today to further investigate. Mild aortic regurg/stenosis, no indication of why the patient was orthostatic Weakness- PT assessed patient at moderate complexity- plan for STR now awaiting auth   Comorbidities Comorbidities present on admission:Thrombocytopenia noted on admission. 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)Chronic AnemiaSecondary diagnoses occurring during hospitalization:HyperkalemiaHypocalcemiaThrombocytopenia Diet: Diet Renal Non DialysisOral/Enteral Rehydration Fluids (Electrolyte Drink/Water) 480 mLVTE PPx:  heparin (PORCINE) Medication Reconciliation: Complete Communication: PMD and RNDischarge Readiness: Expected Discharge Date: 03/05/23 AM-PAC (RN/PT): 12 / 12 Physical Therapy Disposition Recommendation: Moderate complexity (03/03/23)  Expected discharge location: Short Term Rehab Full CodeI spent 35 minutes today on this encounter before, during and after the visit, reviewing labs and records, evaluating and examining the patient, entering orders and documenting the visit. Signed:Jb Dulworth, PA1/6/202511:48 AM

## 2023-03-06 NOTE — Discharge Instructions
 Please follow up with the medical provider at your facility for re-evaluation in 2-3 days. We have stopped your Lasix, carvedilol, tamsulosin and Imdur due to orthostatic hypotension. We have started you on midodrine to help improve your blood pressures. You were also started on sodium bicarbonate for chronically low bicarb. Please monitor your blood pressures closely. You should follow up with your cardiologist in 3-6 weeks for re-evaluation.Return to the ER if you have shortness of breath, chest pain, worsening weakness, worsening dizziness, fevers, severe abdominal pain, blood in your urine, stool or vomit, general worsening.

## 2023-03-07 ENCOUNTER — Encounter
Admit: 2023-03-07 | Payer: PRIVATE HEALTH INSURANCE | Attending: Student in an Organized Health Care Education/Training Program | Primary: Internal Medicine

## 2023-03-07 DIAGNOSIS — D696 Thrombocytopenia, unspecified: Secondary | ICD-10-CM

## 2023-03-07 DIAGNOSIS — Z7409 Other reduced mobility: Secondary | ICD-10-CM

## 2023-03-07 DIAGNOSIS — Z1152 Encounter for screening for COVID-19: Secondary | ICD-10-CM

## 2023-03-07 DIAGNOSIS — Z888 Allergy status to other drugs, medicaments and biological substances status: Secondary | ICD-10-CM

## 2023-03-07 DIAGNOSIS — D509 Iron deficiency anemia, unspecified: Secondary | ICD-10-CM

## 2023-03-07 DIAGNOSIS — I714 Abdominal aortic aneurysm, without rupture, unspecified: Secondary | ICD-10-CM

## 2023-03-07 DIAGNOSIS — I951 Orthostatic hypotension: Secondary | ICD-10-CM

## 2023-03-07 DIAGNOSIS — E875 Hyperkalemia: Secondary | ICD-10-CM

## 2023-03-07 DIAGNOSIS — F32A Depression, unspecified: Secondary | ICD-10-CM

## 2023-03-07 DIAGNOSIS — Z7902 Long term (current) use of antithrombotics/antiplatelets: Secondary | ICD-10-CM

## 2023-03-07 DIAGNOSIS — N401 Enlarged prostate with lower urinary tract symptoms: Secondary | ICD-10-CM

## 2023-03-07 DIAGNOSIS — M1990 Unspecified osteoarthritis, unspecified site: Secondary | ICD-10-CM

## 2023-03-07 DIAGNOSIS — N184 Chronic kidney disease, stage 4 (severe): Secondary | ICD-10-CM

## 2023-03-07 DIAGNOSIS — Z79899 Other long term (current) drug therapy: Secondary | ICD-10-CM

## 2023-03-07 DIAGNOSIS — I2510 Atherosclerotic heart disease of native coronary artery without angina pectoris: Secondary | ICD-10-CM

## 2023-03-07 DIAGNOSIS — D631 Anemia in chronic kidney disease: Secondary | ICD-10-CM

## 2023-03-07 DIAGNOSIS — N179 Acute kidney failure, unspecified: Secondary | ICD-10-CM

## 2023-03-07 DIAGNOSIS — Z87891 Personal history of nicotine dependence: Secondary | ICD-10-CM

## 2023-03-07 DIAGNOSIS — T465X5A Adverse effect of other antihypertensive drugs, initial encounter: Secondary | ICD-10-CM

## 2023-03-07 DIAGNOSIS — W19XXXD Unspecified fall, subsequent encounter: Secondary | ICD-10-CM

## 2023-03-07 DIAGNOSIS — I350 Nonrheumatic aortic (valve) stenosis: Secondary | ICD-10-CM

## 2023-03-07 DIAGNOSIS — R339 Retention of urine, unspecified: Secondary | ICD-10-CM

## 2023-03-07 DIAGNOSIS — E785 Hyperlipidemia, unspecified: Secondary | ICD-10-CM

## 2023-03-07 DIAGNOSIS — Z7982 Long term (current) use of aspirin: Secondary | ICD-10-CM

## 2023-03-07 DIAGNOSIS — R197 Diarrhea, unspecified: Secondary | ICD-10-CM

## 2023-03-07 DIAGNOSIS — M79675 Pain in left toe(s): Secondary | ICD-10-CM

## 2023-03-07 DIAGNOSIS — I10 Essential (primary) hypertension: Secondary | ICD-10-CM

## 2023-03-07 DIAGNOSIS — R296 Repeated falls: Secondary | ICD-10-CM

## 2023-03-07 DIAGNOSIS — I129 Hypertensive chronic kidney disease with stage 1 through stage 4 chronic kidney disease, or unspecified chronic kidney disease: Secondary | ICD-10-CM

## 2023-03-07 DIAGNOSIS — R319 Hematuria, unspecified: Secondary | ICD-10-CM

## 2023-03-07 MED ORDER — GABAPENTIN 100 MG CAPSULE
100 mg | ORAL_CAPSULE | Freq: Two times a day (BID) | ORAL | 100 refills | 14.00 days | Status: AC
Start: 2023-03-07 — End: 2023-03-15
  Filled 2023-03-07: qty 28, 14d supply, fill #0

## 2023-03-07 MED ORDER — KETOCONAZOLE 2 % TOPICAL CREAM
2 % | TOPICAL | 100 refills | 14.00 days | Status: AC
Start: 2023-03-07 — End: ?
  Filled 2023-03-07: qty 60, 14d supply, fill #0

## 2023-03-07 MED ORDER — MIDODRINE 2.5 MG TABLET
2.5 mg | ORAL_TABLET | 100 refills | 14.00 days | Status: AC
Start: 2023-03-07 — End: ?

## 2023-03-07 MED ORDER — OMEPRAZOLE 20 MG CAPSULE,DELAYED RELEASE
20 mg | ORAL_CAPSULE | Freq: Every day | ORAL | 100 refills | 14.00 days | Status: AC
Start: 2023-03-07 — End: ?
  Filled 2023-03-07: qty 14, 14d supply, fill #0

## 2023-03-07 MED ORDER — CAMPHOR-MENTHOL 0.5 %-0.5 % LOTION
0.5-0.5 % | TOPICAL | 100 refills | 14.00 days | Status: AC
Start: 2023-03-07 — End: ?

## 2023-03-07 MED ORDER — NITROGLYCERIN 0.4 MG SUBLINGUAL TABLET
0.4 mg | ORAL_TABLET | 100 refills | 8.00 days | Status: AC
Start: 2023-03-07 — End: ?
  Filled 2023-03-07: qty 25, 8d supply, fill #0

## 2023-03-07 MED ORDER — CLOPIDOGREL 75 MG TABLET
75 mg | ORAL_TABLET | Freq: Every day | ORAL | 100 refills | 14.00 days | Status: AC
Start: 2023-03-07 — End: ?
  Filled 2023-03-07: qty 14, 14d supply, fill #0

## 2023-03-07 MED ORDER — ATORVASTATIN 80 MG TABLET
80 mg | ORAL_TABLET | Freq: Every day | ORAL | 100 refills | 14.00 days | Status: AC
Start: 2023-03-07 — End: ?
  Filled 2023-03-07: qty 14, 14d supply, fill #0

## 2023-03-07 MED ORDER — ESCITALOPRAM 10 MG TABLET
10 mg | ORAL_TABLET | Freq: Every day | ORAL | 100 refills | 14.00 days | Status: AC
Start: 2023-03-07 — End: ?

## 2023-03-07 MED ORDER — IPRATROPIUM BROMIDE 21 MCG (0.03 %) NASAL SPRAY
210.03 mcg (0.03 %) | Freq: Two times a day (BID) | NASAL | 100 refills | 50.00 days | Status: AC
Start: 2023-03-07 — End: ?
  Filled 2023-03-07: qty 30, 50d supply, fill #0

## 2023-03-07 NOTE — Progress Notes
 Inpatient Occupational Therapy Progress NoteDefault Flowsheet Data (most recent)   IP Adult OT Eval/Treat - 03/06/23 1451    Date of Visit / Treatment  Date of Visit / Treatment 03/06/23   Start Time 1426   End Time 1451   Total Treatment Time 25 minutes    General Information  Subjective patient agreeable to OT   General Observations patient supine in bed, room air, NAD   Precautions/Limitations Fall Precautions;Bed alarm;Chair alarm    Weight Bearing Status  Weight Bearing Status WNL - Within normal limits    Vital Signs and Orthostatic Vital Signs  Vital Signs Vital Signs Stable   Vital Signs Free text room air, asymptomatic   Sitting Orthostatic BP 81/31    Standing Orthostatic BP 105/71    Pain/Comfort  Pain Comment (Pre/Post Treatment Pain) patient has max pain in L great toe, inc with mobility   degenerative changes   Patient Coping  Observed Emotional State frustrated   Verbalized Emotional State acceptance   Diversional Activities smartphone   Additional Patient Coping Comments patient is discouarged with hospital stay    Cognition  Orientation Level Oriented X4   Following Commands Follows one step commands with increased time;Follows one step commands with repetition   Personal Safety / Judgment Fall risk    Skin Assessment  Skin Assessment See Nursing Documentation    Balance  Sitting Balance: Static  FAIR+     Maintains static position without assist or device, may require Supervision or Verbal Cues (>2 minutes)   Sitting Balance: Dynamic  FAIR      Performs dynamic activities through 75% range Contact Guard or partial range (50-75%) with Supervision   Standing Balance: Static FAIR-      Contact Guard to maintain static position with no Assistive Device   Standing Balance: Dynamic  FAIR-     Performs dynamic activities through partial range (50-75%) with Futures trader Comment A x 2    ADL: Grooming  Independence/Assistance Level Set-up required   Assistive Device No device   Grooming Activities wash face   Independence limited by Impaired balance;Decreased activity tolerance   Grooming Comments sitting on EOB     AM-PAC - Daily Activity IP Short Form  Help needed from another person putting on/taking off regular lower body clothing 2 - A Lot   Help needed from another person for bathing (incl. washing, rinsing, drying) 2 - A Lot   Help needed from another person for toileting (incl. using toilet, bedpan, urinal) 2 - A Lot   Help needed from another person putting on/taking off regular upper body clothing 3 - A Little   Help needed from another person taking care of personal grooming such as brushing teeth 3 - A Little   Help needed from another person eating meals 4 - None   AM-PAC Daily Activity Raw Score (Total of rows above) 16   CMS Score (based on Raw Score - with G Code) 16 - 53.32% impaired      (G Code - CK)    Bed Mobility  Supine-to-Sit Independence/Assistance Level Contact guard   Supine-to-Sit Assist Device Head of bed elevated   Sit-to-Supine Independence/Assistance Level Minimum assist   Sit-to-Supine Assist Device No device    Sit-Stand Transfer Training  Symptoms Noted During/After Treatment (Sit-to-Stand Transfer Training) --   hypotension improves with standing  Sit-to-Stand Transfer Independence/Assistance Level Minimum assist;Assist of 2   Sit-to-Stand Transfer Assist  Device Rolling walker   Stand-to-Sit Transfer Independence/Assistance Level Minimum assist;Assist of 2   Stand-to-Sit Transfer Assist Device Chartered certified accountant Analysis Concerns cues for hand placement   Transfer Safety Analysis Impairments impaired balance   Occupation-Based Indications: Sit to Stand Transfers To perform grooming tasks standing;To complete toilet/commode transfers;To simulate the use of grab bars in bathroom   Sit-Stand Transfer Comments no dizziness, toe pain    Functional Ambulation  Symptoms Noted During/After Treatment  fatigue;increased pain   Independence/Assistance Level  Moderate assist;Assist of 2   Assistive Device  Rolling walker   Gait Distance bed to chair;chair to bed   Gait Analysis Impairments impaired balance;pain;decreased strength   Ambulation distance was limited by: pain   Occupation-based Indications:  Functional Ambulation In preparation for bathing and showering tasks;In preparation to complete grooming tasks;In preparation for mobility to bathroom for toileting tasks   Functional Ambulation Comments transfer bed-chair-bed    Handoff Documentation  Handoff Chair alarm;Patient in chair;Discussed with nursing;Patient instructed to call nursing for mobility    Activity Tolerance  Activity Tolerance Comments fair -    Therapeutic Functional Activity  Interventions to support occupations Engaged in balance tasks in preparation for occupation-based tasks;Challenged activity tolerance to increase independence with ADLs    Clinical Impression  Follow up Assessment tol OT well, no dizziness. OOB with A x2. Patient has impiared balance, activity tol, and significant toe pain. rec mod complexity   Criteria for Skilled Therapeutic Interventions Met yes;treatment indicated   Rehab Potential good, to achieve stated therapy goals    Patient/Family Stated Goals  Patient/Family Stated Goal(s) get stronger;feel better    Frequency/Equipment Recommendations  OT Frequency 3x per week   Next Treatment Expected 03/09/23   OT/OTA completing this assessment Javonne Louissaint    OT Recommendations for Inpatient Admission  ADL Recommendations assist of 2;bedside commode;with rolling walker    Planned Treatment / Interventions  Plan for Next Visit progress  as tol   Education Treatment / Interventions Patient Education / Training   OT POC   OT Discharge Summary  Disposition Recommendation Moderate complexity support and therapy to progress functional mobility/ ADLs/ IADLs recommended for post- acute care.  See assessment for additional details.     Gerald Stabs OTR/L1/08/2023

## 2023-03-08 ENCOUNTER — Other Ambulatory Visit: Admit: 2023-03-08 | Payer: PRIVATE HEALTH INSURANCE | Attending: Geriatrics | Primary: Internal Medicine

## 2023-03-08 DIAGNOSIS — E785 Hyperlipidemia, unspecified: Secondary | ICD-10-CM

## 2023-03-08 DIAGNOSIS — I2510 Atherosclerotic heart disease of native coronary artery without angina pectoris: Secondary | ICD-10-CM

## 2023-03-08 DIAGNOSIS — I200 Unstable angina: Secondary | ICD-10-CM

## 2023-03-08 DIAGNOSIS — W19XXXA Unspecified fall, initial encounter: Secondary | ICD-10-CM

## 2023-03-08 LAB — CBC WITH AUTO DIFFERENTIAL
BKR WAM ABSOLUTE IMMATURE GRANULOCYTES.: 0.02 x 1000/ÂµL (ref 0.00–0.30)
BKR WAM ABSOLUTE LYMPHOCYTE COUNT.: 1 x 1000/ÂµL (ref 0.60–3.70)
BKR WAM ABSOLUTE NRBC (2 DEC): 0 x 1000/ÂµL (ref 0.00–1.00)
BKR WAM ANC (ABSOLUTE NEUTROPHIL COUNT): 3.17 x 1000/ÂµL (ref 2.00–7.60)
BKR WAM BASOPHIL ABSOLUTE COUNT.: 0.04 x 1000/ÂµL (ref 0.00–1.00)
BKR WAM BASOPHILS: 0.7 % (ref 0.0–1.4)
BKR WAM EOSINOPHIL ABSOLUTE COUNT.: 0.65 x 1000/ÂµL (ref 0.00–1.00)
BKR WAM EOSINOPHILS: 11.7 % — ABNORMAL HIGH (ref 0.0–5.0)
BKR WAM HEMATOCRIT (2 DEC): 27.8 % — ABNORMAL LOW (ref 38.50–50.00)
BKR WAM HEMOGLOBIN: 8.4 g/dL — ABNORMAL LOW (ref 13.2–17.1)
BKR WAM IMMATURE GRANULOCYTES: 0.4 % (ref 0.0–1.0)
BKR WAM LYMPHOCYTES: 18.1 % (ref 17.0–50.0)
BKR WAM MCH (PG): 23.4 pg — ABNORMAL LOW (ref 27.0–33.0)
BKR WAM MCHC: 30.2 g/dL — ABNORMAL LOW (ref 31.0–36.0)
BKR WAM MCV: 77.4 fL — ABNORMAL LOW (ref 80.0–100.0)
BKR WAM MONOCYTE ABSOLUTE COUNT.: 0.66 x 1000/ÂµL (ref 0.00–1.00)
BKR WAM MONOCYTES: 11.9 % (ref 4.0–12.0)
BKR WAM MPV: 10.1 fL (ref 8.0–12.0)
BKR WAM NEUTROPHILS: 57.2 % (ref 39.0–72.0)
BKR WAM NUCLEATED RED BLOOD CELLS: 0 % (ref 0.0–1.0)
BKR WAM PLATELETS: 196 x1000/ÂµL (ref 150–420)
BKR WAM RDW-CV: 15.7 % — ABNORMAL HIGH (ref 11.0–15.0)
BKR WAM RED BLOOD CELL COUNT.: 3.59 M/ÂµL — ABNORMAL LOW (ref 4.00–6.00)
BKR WAM WHITE BLOOD CELL COUNT: 5.5 x1000/ÂµL (ref 4.0–11.0)

## 2023-03-08 LAB — BASIC METABOLIC PANEL
BKR ANION GAP: 13 (ref 7–17)
BKR BLOOD UREA NITROGEN: 34 mg/dL — ABNORMAL HIGH (ref 8–23)
BKR BUN / CREAT RATIO: 13.6 (ref 8.0–23.0)
BKR CALCIUM: 8.7 mg/dL — ABNORMAL LOW (ref 8.8–10.2)
BKR CHLORIDE: 105 mmol/L (ref 98–107)
BKR CO2: 21 mmol/L (ref 20–30)
BKR CREATININE DELTA: 0.1
BKR CREATININE: 2.5 mg/dL — ABNORMAL HIGH (ref 0.40–1.30)
BKR EGFR, CREATININE (CKD-EPI 2021): 23 mL/min/{1.73_m2} — ABNORMAL LOW (ref >=60)
BKR GLUCOSE: 137 mg/dL — ABNORMAL HIGH (ref 70–100)
BKR POTASSIUM: 4.8 mmol/L (ref 3.3–5.3)
BKR SODIUM: 139 mmol/L (ref 136–144)

## 2023-03-13 ENCOUNTER — Encounter: Admit: 2023-03-13 | Payer: PRIVATE HEALTH INSURANCE | Attending: Internal Medicine | Primary: Internal Medicine

## 2023-03-14 ENCOUNTER — Inpatient Hospital Stay
Admit: 2023-03-14 | Discharge: 2023-03-16 | Payer: PRIVATE HEALTH INSURANCE | Attending: Internal Medicine | Primary: Internal Medicine

## 2023-03-14 ENCOUNTER — Encounter: Admit: 2023-03-14 | Payer: PRIVATE HEALTH INSURANCE | Attending: Internal Medicine | Primary: Internal Medicine

## 2023-03-14 ENCOUNTER — Emergency Department: Admit: 2023-03-14 | Payer: PRIVATE HEALTH INSURANCE

## 2023-03-14 DIAGNOSIS — R079 Chest pain, unspecified: Secondary | ICD-10-CM

## 2023-03-14 DIAGNOSIS — R0602 Shortness of breath: Secondary | ICD-10-CM

## 2023-03-14 LAB — COMPREHENSIVE METABOLIC PANEL
BKR A/G RATIO: 1.3 (ref 1.0–2.2)
BKR ALANINE AMINOTRANSFERASE (ALT): 20 U/L (ref 9–59)
BKR ALBUMIN: 3.4 g/dL — ABNORMAL LOW (ref 3.6–5.1)
BKR ALKALINE PHOSPHATASE: 86 U/L (ref 9–122)
BKR ANION GAP: 12 (ref 7–17)
BKR ASPARTATE AMINOTRANSFERASE (AST): 22 U/L (ref 10–35)
BKR AST/ALT RATIO: 1.1
BKR BILIRUBIN TOTAL: 0.4 mg/dL (ref <=1.2)
BKR BLOOD UREA NITROGEN: 38 mg/dL — ABNORMAL HIGH (ref 8–23)
BKR BUN / CREAT RATIO: 14.6 (ref 8.0–23.0)
BKR CALCIUM: 8.4 mg/dL — ABNORMAL LOW (ref 8.8–10.2)
BKR CHLORIDE: 109 mmol/L — ABNORMAL HIGH (ref 98–107)
BKR CO2: 21 mmol/L (ref 20–30)
BKR CREATININE DELTA: 0.1
BKR CREATININE: 2.6 mg/dL — ABNORMAL HIGH (ref 0.40–1.30)
BKR EGFR, CREATININE (CKD-EPI 2021): 22 mL/min/{1.73_m2} — ABNORMAL LOW (ref >=60)
BKR GLOBULIN: 2.7 g/dL (ref 2.0–3.9)
BKR GLUCOSE: 103 mg/dL — ABNORMAL HIGH (ref 70–100)
BKR POTASSIUM: 4.5 mmol/L (ref 3.3–5.3)
BKR PROTEIN TOTAL: 6.1 g/dL (ref 5.9–8.3)
BKR SODIUM: 142 mmol/L (ref 136–144)

## 2023-03-14 LAB — CBC WITH AUTO DIFFERENTIAL
BKR WAM ABSOLUTE IMMATURE GRANULOCYTES.: 0.03 x 1000/ÂµL (ref 0.00–0.30)
BKR WAM ABSOLUTE LYMPHOCYTE COUNT.: 1.01 x 1000/ÂµL (ref 0.60–3.70)
BKR WAM ABSOLUTE NRBC (2 DEC): 0 x 1000/ÂµL (ref 0.00–1.00)
BKR WAM ANC (ABSOLUTE NEUTROPHIL COUNT): 3.43 x 1000/ÂµL (ref 2.00–7.60)
BKR WAM BASOPHIL ABSOLUTE COUNT.: 0.04 x 1000/ÂµL (ref 0.00–1.00)
BKR WAM BASOPHILS: 0.7 % (ref 0.0–1.4)
BKR WAM EOSINOPHIL ABSOLUTE COUNT.: 0.47 x 1000/ÂµL (ref 0.00–1.00)
BKR WAM EOSINOPHILS: 8.5 % — ABNORMAL HIGH (ref 0.0–5.0)
BKR WAM HEMATOCRIT (2 DEC): 27.9 % — ABNORMAL LOW (ref 38.50–50.00)
BKR WAM HEMOGLOBIN: 8.4 g/dL — ABNORMAL LOW (ref 13.2–17.1)
BKR WAM IMMATURE GRANULOCYTES: 0.5 % (ref 0.0–1.0)
BKR WAM LYMPHOCYTES: 18.2 % (ref 17.0–50.0)
BKR WAM MCH (PG): 23.1 pg — ABNORMAL LOW (ref 27.0–33.0)
BKR WAM MCHC: 30.1 g/dL — ABNORMAL LOW (ref 31.0–36.0)
BKR WAM MCV: 76.9 fL — ABNORMAL LOW (ref 80.0–100.0)
BKR WAM MONOCYTE ABSOLUTE COUNT.: 0.58 x 1000/ÂµL (ref 0.00–1.00)
BKR WAM MONOCYTES: 10.4 % (ref 4.0–12.0)
BKR WAM MPV: 9.9 fL (ref 8.0–12.0)
BKR WAM NEUTROPHILS: 61.7 % (ref 39.0–72.0)
BKR WAM NUCLEATED RED BLOOD CELLS: 0 % (ref 0.0–1.0)
BKR WAM PLATELETS: 234 x1000/ÂµL (ref 150–420)
BKR WAM RDW-CV: 15.9 % — ABNORMAL HIGH (ref 11.0–15.0)
BKR WAM RED BLOOD CELL COUNT.: 3.63 M/ÂµL — ABNORMAL LOW (ref 4.00–6.00)
BKR WAM WHITE BLOOD CELL COUNT: 5.6 x1000/ÂµL (ref 4.0–11.0)

## 2023-03-14 LAB — NT-PROBNPE: BKR B-TYPE NATRIURETIC PEPTIDE, PRO (PROBNP): 5474 pg/mL — ABNORMAL HIGH (ref <450.0)

## 2023-03-14 LAB — TROPONIN T HIGH SENSITIVITY, 1 HOUR WITH REFLEX (BH GH LMW YH)
BKR TROPONIN T HS 1 HOUR DELTA FROM 0 HOUR: 4 ng/L
BKR TROPONIN T HS 1 HOUR: 74 ng/L — ABNORMAL HIGH

## 2023-03-14 LAB — D-DIMER, QUANTITATIVE: BKR D-DIMER: 6.76 mg{FEU}/L — ABNORMAL HIGH (ref <=0.95)

## 2023-03-14 LAB — TROPONIN T HIGH SENSITIVITY, 0 HOUR BASELINE WITH REFLEX (BH GH LMW YH): BKR TROPONIN T HS 0 HOUR BASELINE: 70 ng/L — ABNORMAL HIGH

## 2023-03-14 MED ORDER — POLYETHYLENE GLYCOL 3350 17 GRAM ORAL POWDER PACKET
17 gram | Freq: Every day | ORAL | Status: DC
Start: 2023-03-14 — End: 2023-03-17
  Administered 2023-03-15 – 2023-03-16 (×2): 17 gram via ORAL

## 2023-03-14 MED ORDER — ACETAMINOPHEN 325 MG TABLET
325 | Freq: Four times a day (QID) | ORAL | Status: DC | PRN
Start: 2023-03-14 — End: 2023-03-15

## 2023-03-14 MED ORDER — SENNOSIDES 8.6 MG TABLET
8.6 mg | Freq: Every evening | ORAL | Status: AC
Start: 2023-03-14 — End: ?

## 2023-03-14 MED ORDER — CAMPHOR-MENTHOL 0.5 %-0.5 % LOTION
0.5-0.5 | Status: SS
Start: 2023-03-14 — End: 2023-03-15

## 2023-03-14 MED ORDER — GABAPENTIN 100 MG CAPSULE
100 mg | Freq: Every evening | ORAL | Status: AC
Start: 2023-03-14 — End: ?

## 2023-03-14 MED ORDER — CLOPIDOGREL 75 MG TABLET
75 mg | Freq: Every day | ORAL | Status: DC
Start: 2023-03-14 — End: 2023-03-17
  Administered 2023-03-15 – 2023-03-16 (×2): 75 mg via ORAL

## 2023-03-14 MED ORDER — HEPARIN (PORCINE) 5,000 UNIT/ML INJECTION SOLUTION
5000 unit/mL | Freq: Three times a day (TID) | SUBCUTANEOUS | Status: DC
Start: 2023-03-14 — End: 2023-03-17
  Administered 2023-03-15 – 2023-03-16 (×5): 5000 mL via SUBCUTANEOUS

## 2023-03-14 MED ORDER — POLYETHYLENE GLYCOL ORAL POWDER (BOWEL PREP)
17 gram/dose | Freq: Every day | ORAL | Status: AC
Start: 2023-03-14 — End: ?

## 2023-03-14 MED ORDER — SODIUM CHLORIDE 0.9 % (FLUSH) INJECTION SYRINGE
0.9 % | Freq: Three times a day (TID) | INTRAVENOUS | Status: DC
Start: 2023-03-14 — End: 2023-03-17
  Administered 2023-03-15 – 2023-03-16 (×5): 0.9 mL via INTRAVENOUS

## 2023-03-14 MED ORDER — DICLOFENAC 1 % TOPICAL GEL
1 % | Status: AC
Start: 2023-03-14 — End: ?

## 2023-03-14 MED ORDER — SODIUM CHLORIDE 0.9 % (FLUSH) INJECTION SYRINGE
0.9 % | INTRAVENOUS | Status: DC | PRN
Start: 2023-03-14 — End: 2023-03-17

## 2023-03-14 MED ORDER — ASPIRIN 81 MG TABLET,DELAYED RELEASE
81 mg | Freq: Every day | ORAL | Status: DC
Start: 2023-03-14 — End: 2023-03-17
  Administered 2023-03-15 – 2023-03-16 (×2): 81 mg via ORAL

## 2023-03-14 MED ORDER — NUTRITIONAL SUPPLEMENT-FIBER ORAL LIQUID
ORAL | Status: AC | PRN
Start: 2023-03-14 — End: ?

## 2023-03-14 MED ORDER — ESCITALOPRAM 10 MG TABLET
10 mg | Freq: Every day | ORAL | Status: DC
Start: 2023-03-14 — End: 2023-03-17
  Administered 2023-03-15 – 2023-03-16 (×2): 10 mg via ORAL

## 2023-03-14 MED ORDER — SODIUM CHLORIDE 0.65 % NASAL SPRAY AEROSOL
0.65 % | Freq: Two times a day (BID) | NASAL | Status: DC | PRN
Start: 2023-03-14 — End: 2023-03-17

## 2023-03-14 MED ORDER — ROSUVASTATIN 40 MG TABLET
40 mg | Freq: Every day | ORAL | Status: DC
Start: 2023-03-14 — End: 2023-03-17
  Administered 2023-03-15 – 2023-03-16 (×2): 40 mg via ORAL

## 2023-03-14 MED ORDER — GABAPENTIN 100 MG CAPSULE
100 mg | Freq: Two times a day (BID) | ORAL | Status: DC
Start: 2023-03-14 — End: 2023-03-17
  Administered 2023-03-15 – 2023-03-16 (×4): 100 mg via ORAL
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
## 2023-03-14 NOTE — Care Coordination-Inpatient
 89 year old gentleman with past medical history of CAD, AAA, HTN, CKD, old MI, history of cardiac stent who was seen for evaluation of acute onset chest pain and pressure which started upon awakening. Patient will require available bed, PT eval, and re authorization when medically cleared. Case Management Screening  Flowsheet Row Most Recent Value Case Management Screening: Chart review completed. If YES to any question below then proceed to CM Eval/Plan  Is there a change in their cognitive function No Do you anticipate that the pt will have any discharge needs requiring CM intervention? Yes Has there been an unscheduled readmission within the last 30 days and/or four (4) encounters (encounters include: ED, OBS, Inpatient) within the last six (6) months? Yes Were there services prior to admission ( Examples: Acute [facility],  Assisted Living, HD, Homecare, Extended Care Facility, Methadone, SNF, Outpatient Infusion Center) Yes Negative/Positive Screen Positive Screening: Complete CM Evaluation and Plan Case manager will take lead to arrange post-acute care services and continue to work with the care team as the patient progresses towards discharge Yes Case Manager Attestation  I have reviewed the medical record and completed the above screen. CM staff will follow patient's progress and discuss the plan of care with the Treatment Team. Yes  Case Management Evaluation  Flowsheet Row Most Recent Value Case Management Evaluation and Plan  Arrived from prior to admission SNF (receiving short term rehab) Admitted from: [employer] [facility]  no Lives with Facility Resident Patient Requires Care Coordination Intervention Due To Readmission within the last 30 days Prior to Hospitalization: Assistance Needed/DME being used Ambulation Ambulation Assistance/DME: Manual wheelchair, Rollator, Straight cane Documented Insurance Accurate Yes Any financial concerns related to anticipated discharge needs No Patient's home address verified Yes Patient's PCP of record verified Yes Last Date Seen by PCP 0-3 months Living Environment  Lives with Facility Resident Source of Clinical History  Patient's clinical history has been reviewed and source of Information is: Medical Record Case Manager Attestation  I have reviewed the medical record and completed the above evaluation with the following recommendations. Yes Discharge Planning Coordination Recommendations  Discharge Planning Coordination Recommendations STR (short term rehab at a SNF rehab unit) Case Manager reviewed plan of care/ continuum of care need's with  Interdisciplinary Team  Aldine Contes RN MSNCare Manager ED/OBSSRC/[facility]

## 2023-03-14 NOTE — ED Provider Notes
 Chief Complaint Patient presents with  Chest Pain   BIBA from [facility] for eval of chest pain since this am. 324mg  ASA and 1 Nitro given. At [facility] HR 110 and SBP 160s, reports improved symptoms to pain An acute or life threatening problem was considered during this evaluation  A decision regarding hospitalization was made during this visit  External data reviewed: Notes (OSH or non-ED)History from independent historian: Relative.Independent interpretation of: XRay (no overt ptx)  Physical ExamED Triage Vitals [03/14/23 0926]BP: (!) 102/59Pulse: 77Pulse from  O2 sat: n/aResp: 18Temp: 97 ?F (36.1 ?C)Temp src: n/aSpO2: 100 % BP (!) 102/59  - Pulse 77  - Temp 97 ?F (36.1 ?C)  - Resp 18  - SpO2 100% Physical Exam ProceduresAttestation/Critical CarePatient Reevaluation: Pt with hx of CAD, HTN, HL, here with c/o cp, coming from [place], woke up with a chest pain, has never really had this before, not pleuritic, did radiate somewhat to the back and his left side, no shortness of breath, no nausea or diaphoresis, no pleuritic component, no calf pain or swelling, no recent fever or other infectious symptoms.  On exam well-appearing, lungs clear, belly soft, no lower extremity edema.  Concerning for possible ACS, musculoskeletal, GERD, less likely dissection or PE, can send a dimer given that he has been recently hospitalized and has had limited mobility, currently getting PT at [facility].  In for labs, EKG, chest x-ray, dispo per results.Troponin around baseline for him, creatinine around baseline.  His dimer is elevated.  Given he is unable to get a PE [place] will order for V/Q scan, admit.  He is currently pain-free.Clinical Impressions as of 03/14/23 1226 Chest pain, unspecified type  ED DispositionAdmit  Arther Dames, MD01/14/25 1226

## 2023-03-14 NOTE — Plan of Care
 Problem: WoundGoal: Optimal CopingOutcome: Interventions implemented as appropriateGoal: Optimal Functional AbilityOutcome: Interventions implemented as appropriateGoal: Absence of Infection Signs and SymptomsOutcome: Interventions implemented as appropriateGoal: Improved Oral IntakeOutcome: Interventions implemented as appropriateGoal: Optimal Pain Control and FunctionOutcome: Interventions implemented as appropriateGoal: Skin Health and IntegrityOutcome: Interventions implemented as appropriateGoal: Optimal Wound HealingOutcome: Interventions implemented as appropriate Problem: Skin Injury Risk IncreasedGoal: Skin Health and IntegrityOutcome: Interventions implemented as appropriate Problem: Fall Injury RiskGoal: Absence of Fall and Fall-Related InjuryOutcome: Interventions implemented as appropriate Problem: Urinary Elimination ManagementGoal: Effective Urinary Elimination/ContinenceOutcome: Interventions implemented as appropriate Plan of Care Overview/ Patient Status1500-1930Pt is A&OX 4 on 2 L this shift. Continent of bowel with foley catheter in place draining cloudy sedimented & malodorous adequate urine this shift. LBM 01/13 Cardiac diet pills whole with water. Evolving pressure injury on the coccyx area, barrier cream and foam dressing applied. CDI. Assist x 2 OOB with RW. VSS. T&Rq2h, heels offloaded. Hourly rounding. Call bell within reach. Alarmed and audible. Safety maintained. Mickie Kay, RN1/14/2025

## 2023-03-14 NOTE — ED Notes
 9:52 AMPt BIBA from [facility] for episode of chest pain that started when the patient got up this AM. Pt endorses midsternal sharp chest pain that radiated to the L side of chest. Pt denies any SOB during episode. Pt received 324 aspirin and 1 dose of nitro PTA. Pt endorses relief from symptoms, pt currently denies any CP/SOB at this time. Pt is aox4 speaking in complete and full sentences. Daughter at bedside.10:01 AMProvider at bedside.10:25 AMPt to XR.11:44 AMCall received from [place] daughter, update given.1:28 PMProvider at bedside.

## 2023-03-15 ENCOUNTER — Ambulatory Visit: Admit: 2023-03-15 | Payer: PRIVATE HEALTH INSURANCE

## 2023-03-15 DIAGNOSIS — R079 Chest pain, unspecified: Secondary | ICD-10-CM

## 2023-03-15 LAB — TROPONIN T HIGH SENSITIVITY, 3 HOUR (BH GH LMW YH)
BKR TROPONIN T HS 1 HOUR DELTA FROM 0 HOUR ON 3HR: 4 ng/L
BKR TROPONIN T HS 3 HOUR DELTA FROM 0 HOUR: 11 ng/L
BKR TROPONIN T HS 3 HOUR: 81 ng/L — ABNORMAL HIGH

## 2023-03-15 MED ORDER — KETOCONAZOLE 2 % TOPICAL CREAM
2 | Freq: Two times a day (BID) | TOPICAL | Status: DC
Start: 2023-03-15 — End: 2023-03-17
  Administered 2023-03-16 (×2): 2 % via TOPICAL

## 2023-03-15 MED ORDER — SENNOSIDES 8.6 MG TABLET
8.6 | Freq: Every evening | ORAL | Status: DC
Start: 2023-03-15 — End: 2023-03-17
  Administered 2023-03-15: 23:00:00 8.6 mg via ORAL

## 2023-03-15 MED ORDER — TECHNETIUM TC-99M ALBUMIN AGGREGATED
Freq: Once | INTRAVENOUS | Status: CP | PRN
Start: 2023-03-15 — End: ?
  Administered 2023-03-15: 15:00:00 via INTRAVENOUS

## 2023-03-15 MED ORDER — MIDODRINE 2.5 MG TABLET
2.5 | Freq: Three times a day (TID) | ORAL | Status: DC
Start: 2023-03-15 — End: 2023-03-17
  Administered 2023-03-16: 15:00:00 2.5 mg via ORAL

## 2023-03-15 MED ORDER — SODIUM BICARBONATE 325 MG TABLET
325 | Freq: Three times a day (TID) | ORAL | Status: DC
Start: 2023-03-15 — End: 2023-03-17
  Administered 2023-03-15 – 2023-03-16 (×3): 325 mg via ORAL

## 2023-03-15 MED ORDER — MULTIVITAMIN WITH FOLIC ACID 400 MCG TABLET
400 | Freq: Every day | ORAL | Status: DC
Start: 2023-03-15 — End: 2023-03-17
  Administered 2023-03-16: 14:00:00 400 mcg via ORAL

## 2023-03-15 MED ORDER — TECHNETIUM TC-99M PENTETATE (TC99-DTPA) AEROSOL
Freq: Once | RESPIRATORY_TRACT | Status: CP | PRN
Start: 2023-03-15 — End: ?
  Administered 2023-03-15: 14:00:00 via RESPIRATORY_TRACT

## 2023-03-15 MED ORDER — ACETAMINOPHEN 325 MG TABLET
325 | Freq: Four times a day (QID) | ORAL | Status: DC | PRN
Start: 2023-03-15 — End: 2023-03-17

## 2023-03-15 NOTE — Plan of Care
 Plan of Care Overview/ Patient StatusInpatient Physical Therapy Evaluation IP Adult PT Eval/Treat - 03/15/23 1040    Date of Visit / Treatment  Date of Visit / Treatment 03/15/23   Note Type Evaluation   Progress Report Due 03/29/23   Start Time 1000   End Time 1040   Total Treatment Time 40    General Information  Pertinent History Of Current Problem Per chart71 year old man with h/o HTN, HLD, CAD, DJD, BPH w/ chronic foley, CKD IV presented from SNF for chest pain. He was recently discharged to [facility] on 03/06/23 after management of orthostatic hypotension, AKI on CKD. Patient states doing well at baseline, woke up with chest pain radiating to back and his left side. He denies shortness of breath, palpitations. No leg swelling.    Subjective My toe hurts when I walk   Referring Physician  McKnight   General Observations supine in bed, alert and willing to work   Precautions/Limitations Fall Precautions;Bed alarm;Chair alarm   Precautions/Limitations Comment HOH    Weight Bearing Status  Weight Bearing Status WNL - Within normal limits    Prior Level of Functioning/Social History  Additional Comments Pt lives in second floor condominium with about 5 steps to enter, reports he has 2 stair lifts to get up to his apartment. Ambulates with rollator recently, also owns a RW, cane, and wheelchair, has a shower chair. Daughter lives on first floor unit, provides prn assist. Pt mod (I) for all mobility and ADLs but has had progressive weakness over the past 2 months. Pt admitted from STR    Vital Signs and Orthostatic Vital Signs  Vital Signs Free text RA, NAD    Pain/Comfort  Pain Comment (Pre/Post Treatment Pain) mod pain L toe with mobility    Cognition  Following Commands Follows one step commands without difficulty   Personal Safety / Judgment Fall risk    Vision/ Hearing  Hearing difficulties / Use of hearing aids HOH    Range of Motion  Range of Motion Comments B LE WFL    Manual Muscle Testing  Manual Muscle Testing Comments B LE +3/5 grossly    Muscle Tone  Muscle Tone Testing Results No muscle tone deficits noted    Coordination  Coordination Comments B UE tremors    Sensory Assessment  Sensory Tests Results No sensory impairment noted    Skin Assessment  Skin Assessment See Nursing Documentation    Balance  Sitting Balance: Static  FAIR+     Maintains static position without assist or device, may require Supervision or Verbal Cues (>2 minutes)   Sitting Balance: Dynamic  FAIR      Performs dynamic activities through 75% range Contact Guard or partial range (50-75%) with Supervision   Standing Balance: Static FAIR-      Contact Guard to maintain static position with no Assistive Device   Standing Balance: Dynamic  POOR+   Moves through 1/2 range with minimal assist to right self   Balance Assist Device Rolling walker    Bed Mobility  Supine-to-Sit Independence/Assistance Level Contact guard   Supine-to-Sit Assist Device Head of bed elevated;Bed rails   Sit-to-Supine Independence/Assistance Level Contact guard   Sit-to-Supine Assist Device Bed rails;Draw pad   Bed Mobility, Impairments pain;strength decreased;decreased endurance/activity tolerance    Sit-Stand Transfer Training  Sit-to-Stand Transfer Independence/Assistance Level Minimum assist   Sit-to-Stand Transfer Assist Device Rolling walker   Stand-to-Sit Transfer Independence/Assistance Level Minimum assist   Stand-to-Sit Transfer Assist Device Rolling walker  Transfer Safety Analysis Impairments impaired balance;pain;decreased strength;decreased endurance/activity tolerance    Gait Training  Independence/Assistance Level  Minimum assist   Assistive Device  Rolling walker   Gait Distance 25 feet;x2   Gait Analysis Deviations decreased cadence;increased time in double stance;decreased velocity of limb motion;decreased step length;decreased stride length   Gait Analysis Impairments impaired balance;pain;decreased endurance/activity tolerance;decreased strength   Gait Training Comments forward flexed posture, unsteady with turns,    Handoff Documentation  Handoff Patient in bed;Bed alarm;Patient instructed to call nursing for mobility;Discussed with nursing    PT- AM-PAC - Basic Mobility Screen- How much help from another person do you currently need.....  Turning from your back to your side while in a a flat bed without using rails? 3 - A Little - Requires a little help (supervision, minimal assistance). Can use assistive devices.   Moving from lying on your back to sitting on the side of a flat bed without using bed rails? 3 - A Little - Requires a little help (supervision, minimal assistance). Can use assistive devices.   Moving to and from a bed to a chair (including a wheelchair)? 3 - A Little - Requires a little help (supervision, minimal assistance). Can use assistive devices.   Standing up from a chair using your arms(e.g., wheelchair or bedside chair)? 3 - A Little - Requires a little help (supervision, minimal assistance). Can use assistive devices.   To walk in a hospital room? 3 - A Little - Requires a little help (supervision, minimal assistance). Can use assistive devices.   Climbing 3-5 steps with a railing? 1 - Total - Requires total assistance or cannot do it at all.   AMPAC Mobility Score 16   TARGET Highest Level of Mobility Mobility Level 5, Stand for 1 minute   ACTUAL Highest Level of Mobility Mobility Level 7, Walk 25+ feet    Clinical Impression  Initial Assessment Pt tolerated evaluation well. Pt was CGA for bed mobility, min A x 1 for transfers and gait with RW. Pt amb with slow cadence, forward flexed posture, and unsteady with turns. Pt is currently moderate complexity.   Criteria for Skilled Therapeutic Interventions Met yes;treatment indicated   Rehab Potential good, to achieve stated therapy goals    Patient/Family Stated Goals  Patient/Family Stated Goal(s) feel better    Frequency/Equipment Recommendations  PT Frequency 3x per week   Next Treatment Expected 03/17/23   PT/PTA completing this assessment Ed   Equipment Needs During Admission/Treatment Rolling walker    PT Recommendations for Inpatient Admission  Activity/Level of Assist out of bed;ambulate;assist of 1;with rolling walker;in hall;in room    Planned Treatment / Interventions  Education Treatment / Interventions Patient Education / Training   role of PT, POC   PT Discharge Summary  Physical Therapy Disposition Recommendation Moderate complexity support and therapy to progress functional mobility/ ADLs/ IADLs recommended for post- acute care.  See assessment for additional details.   Additional Therapy Recommendations Physical Therapy Services in Discharge Environment   Equipment Recommendations for Discharge To be determined pending progress     Problem: Physical Therapy GoalsGoal: Physical Therapy GoalsDescription: PT GOALS1. Patient will perform bed mobility independently2. Patient will perform transfers with least assistive device with modified independence3. Patient will ambulate a minimum of 150 feet with least assistive device with modified independence4. Patient will ascend and descend at least 5 steps with modified independence Outcome: Interventions implemented as appropriate Lucia Estelle,  DPT  03/15/2023

## 2023-03-15 NOTE — Utilization Review (ED)
 Utilization Review from XsolisPatient Data:  Patient Name: Mark [name] Age: 89 y.o. DOB: 11/11/1925	 MRN: [id number]	 Indicated Status: Observation Review Comments: unremarkable workup, PT - mod complexity, insurance auth for [place] pending

## 2023-03-15 NOTE — Other
 PHARMACY-ASSISTED MEDICATION REPORTPharmacist review of the best possible medication history obtained by the pharmacy medication history technician has been performed.  I have updated the home medication list and identified the following information that may be relevant to this admission.NOTES/RECOMMENDATIONS No recommendations while inpatient butOf Note: Outpatient he is on both omeprazole and clopidogrel and the combination has an increased risk of death/stroke due to omeprazole's  CYP2C19 inhibitation decreasing clopidogrel effects - would recommend changing omeprazole to pantoprazole on discharge        Prior to Admission Medications Medication Name Sig Taking? Patient Reported   acetaminophen (TYLENOL) 325 mg tabletLast dose:  --  Take 2 tablets (650 mg total) by mouth every 6 (six) hours as needed for pain. Yes     aspirin 81 mg EC delayed release tabletLast dose:  --  Take 1 tablet (81 mg total) by mouth daily. Yes Yes   atorvastatin (LIPITOR) 80 mg tabletLast dose:  --  Take 1 tablet (80 mg total) by mouth daily. Yes     Discontinued: 03/14/2023  7:41 PMLast dose: Not TakingLast Medication Note: >> Lerry Paterson Mar 14, 2023  2:20 PMMedHx Tech(Alexa Moutinho, CPHT): FLAG FOR REMOVAL -  Duplicate prescriptions (same dose/sig), please remove this entry using the Delete/Clean up and utilize a new entryEntered by Moutinho, Alexa, CPHT Tue Mar 14, 2023 1420   Yes   b complex vitamins capsuleLast dose:  --  Take 1 capsule by mouth daily. Yes Yes   bismuth subsalicylate (PEPTO BISMOL) 262 mg chewable tabletLast dose:  --  Take 1 tablet (262 mg total) by mouth 4 (four) times daily as needed. Yes Yes   camphor-menthoL (SARNA) 0.5-0.5 % lotionLast dose:  --  Apply topically twice daily as needed to back for itching Yes     Discontinued: 03/14/2023  7:41 PMLast dose: Not TakingLast Medication Note: >> Lerry Paterson Mar 14, 2023  2:20 PMMedHx Tech(Alexa Moutinho, CPHT): FLAG FOR REMOVAL -  Duplicate prescriptions (same dose/sig), please remove this entry using the Delete/Clean up and utilize a new entryEntered by Moutinho, Alexa, CPHT Tue Mar 14, 2023 1420   Yes   clopidogreL (PLAVIX) 75 mg tabletLast dose:  --  Take 1 tablet (75 mg total) by mouth daily. Yes     Discontinued: 03/14/2023  7:41 PMLast dose: Not TakingLast Medication Note: >> Lerry Paterson Mar 14, 2023  2:20 PMMedHx Tech(Alexa Moutinho, CPHT): FLAG FOR REMOVAL -  Duplicate prescriptions (same dose/sig), please remove this entry using the Delete/Clean up and utilize a new entryEntered by Moutinho, Alexa, CPHT Tue Mar 14, 2023 1420   Yes   diclofenac (VOLTAREN) 1 % gelLast dose:  --  Apply to left big toe topically 4 times daily as needed for arthritis Yes Yes   Discontinued: 03/14/2023  7:41 PMLast dose: Not TakingLast Medication Note: >> Lerry Paterson Mar 14, 2023  2:18 PMMedHx Tech(Alexa Moutinho, CPHT): FLAG FOR REMOVAL -  Duplicate prescriptions (same dose/sig), please remove this entry using the Delete/Clean up and utilize a new entryEntered by Moutinho, Alexa, CPHT Tue Mar 14, 2023 1418       escitalopram oxalate (LEXAPRO) 10 mg tabletLast dose:  --  Take 1 tablet (10 mg total) by mouth daily. Yes     Discontinued: 03/14/2023  7:42 PMLast dose: Not TakingLast Medication Note: >> Suan Halter   Tue Mar 14, 2023  2:22 PMMedHx Tech(Alexa Moutinho, CPHT): FLAG FOR REMOVAL -  Duplicate prescriptions (same  dose/sig), please remove this entry using the Delete/Clean up and utilize a new entryEntered by Suan Halter, CPHT Tue Mar 14, 2023 1422       Discontinued: 03/14/2023  7:41 PMLast dose: Not TakingLast Medication Note: >> Lerry Paterson Mar 14, 2023  2:22 PMMedHx Tech(Alexa Moutinho, CPHT): FLAG FOR REMOVAL -  patient's LIP had changed the dose/direction of the medication. Please remove original order using the Delete/Clean Up and utilize a new entry  [confirmed by: W-10 ]Entered by Moutinho, Alexa, CPHT Tue Mar 14, 2023 1422       gabapentin (NEURONTIN) 100 mg capsuleLast dose:  --  Take 2 capsules (200 mg total) by mouth at bedtime. Yes Yes   ipratropium (ATROVENT) 21 mcg (0.03 %) nasal sprayLast dose:  --  Use 2 sprays in each nostril 2 (two) times daily. Yes     Discontinued: 03/14/2023  7:40 PMLast dose: Not TakingLast Medication Note: >> Lerry Paterson Mar 14, 2023  2:31 PMMedHx Tech(Alexa Moutinho, CPHT): FLAG FOR REMOVAL -  Duplicate prescriptions (same dose/sig), please remove this entry using the Delete/Clean up and utilize a new entryEntered by Suan Halter, CPHT Tue Mar 14, 2023 1431   Yes   Discontinued: 03/14/2023  7:40 PMLast dose: Not TakingLast Medication Note: >> Lerry Paterson Mar 14, 2023  2:21 PMMedHx Tech(Alexa Moutinho, CPHT): FLAG FOR REMOVAL -  Duplicate prescriptions (same dose/sig), please remove this entry using the Delete/Clean up and utilize a new entryEntered by [employer], CPHT Tue Mar 14, 2023 1421       ketoconazole (NIZORAL) 2 % creamLast dose:  --  Apply topically to buttock twice daily Yes     Discontinued: 03/14/2023  7:40 PMLast dose: Not TakingLast Medication Note: >> Lerry Paterson Mar 14, 2023  2:18 PMMedHx Tech(Alexa Moutinho, CPHT): FLAG FOR REMOVAL -  Duplicate prescriptions (same dose/sig), please remove this entry using the Delete/Clean up and utilize a new entryEntered by Moutinho, Alexa, CPHT Tue Mar 14, 2023 1418       midodrine (PROAMATINE) 2.5 mg tabletLast dose:  --  Take 1 tablet by mouth three times a day for hypotension with meals.  do not take any doses later than evening meal or less than 4 hours before bedtime Yes     multivitamin tabletLast dose:  --  Take 1 tablet by mouth daily. Yes Yes   nitroGLYCERIN (NITROSTAT) 0.4 mg SL tabletLast dose:  --  give 1 tablet sublingually every 5 minutes as needed for chest pain. If no relief, call provider. May repeat every 5 minutes for a total of 3 tablets.       Discontinued: 03/14/2023  7:40 PMLast dose: Not TakingLast Medication Note: >> Suan Halter   Tue Mar 14, 2023  2:30 PMMedHx Tech(Alexa Moutinho, CPHT): FLAG FOR REMOVAL -  Duplicate prescriptions (same dose/sig), please remove this entry using the Delete/Clean up and utilize a new entryEntered by Moutinho, Alexa, CPHT Tue Mar 14, 2023 1430       nutritional supplement-fiber LiqdLast dose:  --  Take 120 mLs by mouth as needed (for constipation. Give on 7th shift if no BM). Yes Yes   omeprazole (PRILOSEC) 20 mg capsuleLast dose:  --  Take 1 capsule (20 mg total) by mouth daily. Yes     Discontinued: 03/14/2023  7:41 PMLast dose: Not TakingLast Medication Note: >> Lerry Paterson Mar 14, 2023  2:21 PMMedHx Tech(Alexa Moutinho, CPHT): FLAG FOR REMOVAL -  Duplicate prescriptions (same dose/sig), please remove this entry using the Delete/Clean up and utilize a new entryEntered by Moutinho, Alexa, CPHT Tue Mar 14, 2023 1421   Yes   polyethylene glycol (GLYCOLAX; MIRALAX) 17 gram/dose powderLast dose:  --  Take 17 g by mouth daily. Yes Yes   polyethylene glycol (MIRALAX) 17 gram packetLast dose: Not TakingLast Medication Note: >> Lerry Paterson Mar 14, 2023  2:24 PMMedHx Tech(Alexa Moutinho, CPHT): FLAG FOR REMOVAL -  patient's LIP had changed the dose/direction of the medication.  Please remove original order using the Delete/Clean Up and utilize a new entry  [confirmed by: W-10 ]Entered by Moutinho, Alexa, CPHT Tue Mar 14, 2023 1424 Take 1 packet (17 g total) by mouth 2 (two) times daily as needed for other (constipation). Mix in 8 ounces of water, juice, soda, coffee or tea prior to taking.Patient not taking: Reported on 03/14/2023       senna (SENOKOT) 8.6 mg tabletLast dose: Not TakingLast Medication Note: >> Lerry Paterson Mar 14, 2023  2:14 PMMedHx Tech(Alexa Moutinho, CPHT): FLAG FOR REMOVAL -  patient's LIP had changed the dose/direction of the medication.  Please remove original order using the Delete/Clean Up and utilize a new entry  [confirmed by: W-10 ]Entered by Moutinho, Alexa, CPHT Tue Mar 14, 2023 1414 Take 1 tablet (8.6 mg total) by mouth nightly as needed for constipation.Patient not taking: Reported on 03/14/2023       senna (SENOKOT) 8.6 mg tabletLast dose:  --  Take 2 tablets (17.2 mg total) by mouth every evening before dinner. Yes Yes   sodium bicarbonate 650 mg tabletLast dose:  --  Take 1 tablet (650 mg total) by mouth 3 (three) times daily. Yes     Prior to admission medications last reviewed by Gardiner Rhyme, PharmD on Tue Mar 14, 2023 1942 Thank Dartha Lodge, PharmD1/14/20257:42 PMPhone: MHB

## 2023-03-15 NOTE — Plan of Care
 Plan of Care Overview/ Patient StatusAssumed care @1900 . Pt is A&Ox 4 on 2 L O2 via NC. SR on tele, VSS. Continent of bowel with chronic foley catheter in place draining cloudy urine. LBM 01/13. Pills whole with water. Tolerating cardiac diet. Pressure injury to coccyx, foam dressing CDI. Assist x 2 OOB w/RW. T/R q2h, heels offloaded. Safety measures maintained. Call bell and personal belongings within reach. Bed in low position with brakes locked. See flowsheet for additional details. Problem: WoundGoal: Optimal CopingOutcome: Interventions implemented as appropriateGoal: Optimal Functional AbilityOutcome: Interventions implemented as appropriateGoal: Absence of Infection Signs and SymptomsOutcome: Interventions implemented as appropriateGoal: Improved Oral IntakeOutcome: Interventions implemented as appropriateGoal: Optimal Pain Control and FunctionOutcome: Interventions implemented as appropriateGoal: Skin Health and IntegrityOutcome: Interventions implemented as appropriateGoal: Optimal Wound HealingOutcome: Interventions implemented as appropriate Problem: Skin Injury Risk IncreasedGoal: Skin Health and IntegrityOutcome: Interventions implemented as appropriate Problem: Fall Injury RiskGoal: Absence of Fall and Fall-Related InjuryOutcome: Interventions implemented as appropriate Problem: Urinary Elimination ManagementGoal: Effective Urinary Elimination/ContinenceOutcome: Interventions implemented as appropriate

## 2023-03-15 NOTE — Progress Notes
 [facility] Progress NoteHospital Medicine ServiceAttending Provider: Vinie Sill, MD  Location:  [phone] Day #0Subjective   Patient seen and examined on 03/15/2023.CC: Chest pain, unspecified typeInterim History: Resting in bed. Had just returned from VQ scan.   He doesn't remember having chest pain at all yesterday. No chest pain now, no specific complaints.Objective  Vitals:Temp:  [97.2 ?F (36.2 ?C)-99 ?F (37.2 ?C)] 99 ?F (37.2 ?C)Pulse:  [65-73] 69Resp:  [16-22] 20BP: (126-145)/(60-70) 144/64SpO2:  [94 %-100 %] 99 %Device (Oxygen Therapy): room airO2 Flow (L/min):  [2] 2I/O's:Intake/Output Summary (Last 24 hours) at 03/15/2023 1328Last data filed at 03/15/2023 0607Gross per 24 hour Intake 340 ml Output 2000 ml Net -1660 ml  Physical Exam Gen: NAD, resting in bed, well developed, well nourishedHEENT: NCAT, normal conjunctiva, no scleral icterusCV: RR, normal S1, S2Pulm: Normal effort, no respiratory distress, CTA b/l, no wheezing or ralesMSK: no edema noted, no deformitiesSkin: warm and dry Labs:I have reviewed the patient's pertinent labs as resulted in the EMR.CBC Last 24hrs:  Lytes Last 24hrs:  Chem Last 24hrs:  Coags Last 24hrs:  Peripheral Access:Periph IV 03/14/23 1900 median cubital(antecubital fossa), right (Active)  Foley Catheter:UreTHral Catheter 02/21/23 (Active)   UreTHral Catheter 03/14/23 1400 100% silicone 16 other (see comments) (Active) Foley order placed.Imaging:NM Lung Ventilation PerfusionResult Date: 1/15/2025NM LUNG VENTILATION PERFUSION [facility]) performed on 03/15/2023 9:56 AM INDICATION: 89 years old Male; Chest pain, nonspecific. Upon chart review, this is a 89 year old male with past medical history most significant for hypertension, CAD, and CKD currently admitted for left chest/back pain without shortness of breath or palpitations and AKI on CKD does not have an elevated d-dimer concerning for pulmonary was obtained. TECHNIQUE: Following the inhalation of 40  mCi of Tc57m DTPA aerosol, imaging of the lungs was performed in the anterior, posterior, bilateral anterior oblique and bilateral posterior oblique projections. Subsequently, an intravenous dose of 4 mCi of Tc18m MAA was administered and imaging of the lungs was performed in the same projections.  FINDINGS: There is slightly heterogenous distribution of radiotracer on ventilation and perfusion imaging was likely to reflective of other chronic morbidities versus suboptimal inspiratory effort. Bilateral matched perihilar filling defect is likely to reflect prominent central pulmonary vasculature. There are no mismatched defects to indicate a high probability of pulmonary embolism. Low probability of pulmonary embolism. Report initiated by:  Kirk Ruths, MD Reported and signed by: Lucile Shutters, MD  [facility]   I reviewed lab results and imaging in formulating this patient's plan of care.   Assessment: 89 y.o. male PMHx of HTN, HLD, CAD, DJD, BPH w/ chronic foley, CKD IV presented from SNF left sided chest pain radiating to back, responded to aspirin and nitroglycerin.   Plan: #Chest pain#Hx CAD-reportedly left sided, radiating to back, patient unable to recall episode-cardiac work up on admission including ECG without ischemic changes, troponins elevated but without delta and iso CKD-had just been admitted, D dimer elevated, thus VQ done to rule out PE--low probability-he just had a SPECT within last 6 months--no reversible ischemia, only scar-continue SA, plavix, crestor#CKD4-Cr at baseline-continues on NaHCO3 tabs-midodrine with meals  Comorbidities Comorbidities present on admission:Anemia of Chronic DiseaseSecondary diagnoses occurring during hospitalization:Hypocalcemia Diet: Diet CardiacNutrition SupplementsVTE PPx:  heparin (PORCINE) Medication Reconciliation: Completed: by Pharmacist Communication: RN and Case ManagementDischarge Readiness: Expected Discharge Date: 03/16/23 AM-PAC (RN/PT): 16 / 16 Physical Therapy Disposition Recommendation: Moderate complexity (03/15/23)  Expected discharge location: Short Term Rehab-medically ready pending auth Full CodeI spent 35 minutes today  on this encounter before, during and after the visit, reviewing labs and records, evaluating and examining the patient, entering orders and documenting the visit. Signed:Jacqui Headen, MD1/15/20251:28 PM

## 2023-03-15 NOTE — Plan of Care
 Plan of Care Overview/ Patient StatusPt alert and O x 4. HOH with hearing aide charger left at STR. Pt provided with amplifier. Pt daughter called for an update fro Clinical research associate, same provided. Requested Provider update. Provider Dr Ledon Snare made aware of same.Transfer and ambulate with assist of 1 using a walker.Pt encourage to turn every 2 hours while in bed.At start of shift with O2 via NC, titrate to R/A, SPO2 on R/A 94%, denies SOB/dyspnea.Safety maintained with bed alarm engage, safety monitoring and call light in reach.14:55 POC in progress. Contact plus precaution discontinued.Ambulate in hallway with PT using walker.Staff continue to monitor for safety and offer support.1836 POC in progress. Will continue to  monitor safety and offer support.

## 2023-03-15 NOTE — Care Coordination-Inpatient
 PENDING INSURANCE AUTHORIZATION THIS PATIENT CANNOT BE DISCHARGED UNTIL DETERMINATION OBTAINEDSecured facility: [facility] has been initiated with this patient's insurer. Clinical information submitted to this patient's insurer for review today. We now await the authorization that will allow this patient to transfer to the facility.   Insurance Contact: UHC/Home and Community CarePhone: Fax: PortalReference No.: [id number]

## 2023-03-15 NOTE — Plan of Care
 Mark [name]					Location: CEL 2 OBS/C2022-89 y.o., male				Attending: Vinie Sill, MD	Admit Date: 03/14/2023			[id number] LOS: 0 days Initial Nutrition AssessmentReason For Assessment: per organizational policy, RD screenIdentified At Risk by Screening Criteria: no indicators presentNew Recommendations:Nutrition Prescription: Recommend continuing cardiac diet as orderedRecommend Prosource HP jello BIDReason for assessment: RD screen- PI on admitHPI: Per hospitalist note 21/41 89 year old man with h/o HTN, HLD, CAD, DJD, BPH w/ chronic foley, CKD IV presented from SNF left sided chest pain radiating to back, responded to aspirin and nitroglycerin. EKG is negative for acute ischemic ST-T wave changes, trop elevated but flat with underlying CKD, high BNP, CXR showed mild pulmonary edema. Labs is notable for high d dimer, and waiting for VQ sca. He remains hemodynamically stable on room air.Subjective: Initial nutrition assessment being done for RD screen- PI on admit. Pt with Stage 3 PI coccyx per flowsheet. No PO intake recorded since admit. Labs reviewed- Glucose 103 03/14/23, A1c= 6.4% (12/18/22), Lytes wnl. Pt with significant weight loss x 4 months, does not meet criteria for malnutrition at this time. Recommend continuing cardiac diet as ordered.Recommend Prosource HP jello BIDCurrent Diet Order: Diet Cardiac- 3 gm NaCurrent Intake: No PO intake recorded since admitI/Os: LBM 03/13/23-1.6 L since admitDiet History: Start   Ordered 03/14/23 1412  Diet Cardiac  DIET EFFECTIVE NOW       03/14/23 1414 Food Allergies: Allergies Allergen Reactions  Ace Inhibitors Cough Cultural/Religious/Ethnic Needs: NoSkin: Stage 3 PI coccyxBraden Score: : miralax, statinLabs: Recent Labs   12/30/240702 12/31/240554 01/01/251347 01/03/250843 01/04/251220 01/05/250757 01/08/250948 01/14/250951 NA 137   < > 137 138 138 138 139 142 K 4.5   < > 4.5 4.2 4.7 5.0 4.8 4.5 PHOS 3.3  --   --   --   --   --   --   --  MG 2.0  --  1.9  --   --   --   --   --  CREATININE 2.50*   < > 2.30* 2.40* 2.10* 2.40* 2.50* 2.60* BUN 43*   < > 35* 33* 31* 31* 34* 38*  < > = values in this interval not displayed. Glucose 103 03/14/23. A1c= 6.4% (12/18/22)Lytes wnlAnthropometrics:- Height: 5' 11 (1.803 m)- Current weight: 88.1 kg- BMI: Body mass index is 27.09 kg/m?Marland KitchenWeight History: -8.8% x 4 monthsWt Readings from Last 20 Encounters: 03/14/23 88.1 kg 03/13/23 88.3 kg 02/24/23 86.4 kg 11/17/22 96.6 kg 10/19/22 96.2 kg 10/14/22 96.2 kg 10/06/22 98.9 kg 09/15/22 96.5 kg 07/11/22 97.5 kg 04/22/22 96.2 kg 04/20/22 96.2 kg 04/07/22 96.2 kg 01/07/22 96.2 kg 07/01/21 100 kg 12/29/20 99.1 kg 11/11/20 95.3 kg 09/28/20 87.4 kg 07/24/20 102.6 kg 04/27/20 102.5 kg 10/28/19 95.4 kg Physical findings:Observed no signs/symptoms of muscle wasting or body fat depletion - Edema: none noted ESTIMATED NUTRITION REQUIREMENTS:Kcal/day: 4098-1191 kcal		(30-35 kcal/kg)Protein/day: 132 gm			(1.5 gm/kg) *monitor renal fxnFluids/day: Fluid management per medical team discretion. [phone] ml (31mL/kcal) Needs based on: Current weight 88.1 kg, age, weight status, Stage 3 PI, CKD4Nutrition Diagnosis:	Nutrition Diagnosis/Problem: Increased nutrient needs	Related to: metabolic demand for wound healing	As Evidenced by: Stage 3 PIComments: NewNew Recommendations:Nutrition Prescription: Recommend continuing cardiac diet as orderedRecommend Prosource HP jello BIDPlan: 	Meal and Snacks: Modify distribution, type or amount of food and nutrients within meals or at specified times (cardiac diet)	Medical Food Supplements: Commercial food	Nutrition-related medication management: Medications	D/C & transfer of care to new setting or provider: Collaborative referal to other providers (D/C plans unknown at this time. RD to follow while inpatient. Anticipate d/c on cardiac diet) Goals: Patient will consume >75%  of all meals prior to next nutrition assessment  Patient will consume at least 1 supplement a day prior to next nutrition assessment  Monitoring/Evaluation:Food/Nutrition-Related Outcomes:            food and nutrient intake/administration.Anthropometric Outcomes:            Height, weight, BMI, Weight historyBiochemical Data, Medical Tests, and Procedure Outcomes:            Labs (BMP).Nutrition-Focused Physical Finding Outcomes:            Physical appearance, muscle and fat wasting, swallow function, appetite.RDN following per [facility] Clinical Nutrition Standards of Care.Registered Dietitian Nutritionist: Electronically signed by Edison Simon, MS, RD, CD-N 03/15/2023 Please contact via [facility] with any questions: [phone] Please note: Nutrition is a consult only service on weekends and holidays. Please enter a consult in EPIC if assistance is needed on a weekend or holiday or reach out to [phone] to contact the covering RD.

## 2023-03-16 DIAGNOSIS — J811 Chronic pulmonary edema: Secondary | ICD-10-CM

## 2023-03-16 DIAGNOSIS — R0789 Other chest pain: Secondary | ICD-10-CM

## 2023-03-16 DIAGNOSIS — E785 Hyperlipidemia, unspecified: Secondary | ICD-10-CM

## 2023-03-16 DIAGNOSIS — N184 Chronic kidney disease, stage 4 (severe): Secondary | ICD-10-CM

## 2023-03-16 DIAGNOSIS — I2510 Atherosclerotic heart disease of native coronary artery without angina pectoris: Secondary | ICD-10-CM

## 2023-03-16 DIAGNOSIS — Z8249 Family history of ischemic heart disease and other diseases of the circulatory system: Secondary | ICD-10-CM

## 2023-03-16 DIAGNOSIS — Z955 Presence of coronary angioplasty implant and graft: Secondary | ICD-10-CM

## 2023-03-16 DIAGNOSIS — I350 Nonrheumatic aortic (valve) stenosis: Secondary | ICD-10-CM

## 2023-03-16 DIAGNOSIS — Z960 Presence of urogenital implants: Secondary | ICD-10-CM

## 2023-03-16 DIAGNOSIS — Z79899 Other long term (current) drug therapy: Secondary | ICD-10-CM

## 2023-03-16 DIAGNOSIS — Z7902 Long term (current) use of antithrombotics/antiplatelets: Secondary | ICD-10-CM

## 2023-03-16 DIAGNOSIS — Z888 Allergy status to other drugs, medicaments and biological substances status: Secondary | ICD-10-CM

## 2023-03-16 DIAGNOSIS — N400 Enlarged prostate without lower urinary tract symptoms: Secondary | ICD-10-CM

## 2023-03-16 DIAGNOSIS — I252 Old myocardial infarction: Secondary | ICD-10-CM

## 2023-03-16 DIAGNOSIS — Z87891 Personal history of nicotine dependence: Secondary | ICD-10-CM

## 2023-03-16 DIAGNOSIS — Z7982 Long term (current) use of aspirin: Secondary | ICD-10-CM

## 2023-03-16 DIAGNOSIS — Z9049 Acquired absence of other specified parts of digestive tract: Secondary | ICD-10-CM

## 2023-03-16 DIAGNOSIS — I129 Hypertensive chronic kidney disease with stage 1 through stage 4 chronic kidney disease, or unspecified chronic kidney disease: Secondary | ICD-10-CM

## 2023-03-16 DIAGNOSIS — D631 Anemia in chronic kidney disease: Secondary | ICD-10-CM

## 2023-03-16 DIAGNOSIS — R7989 Other specified abnormal findings of blood chemistry: Secondary | ICD-10-CM

## 2023-03-16 MED ORDER — GABAPENTIN 100 MG CAPSULE
100 mg | ORAL_CAPSULE | Freq: Every evening | ORAL | 100 refills | 14.00 days | Status: AC
Start: 2023-03-16 — End: 2023-03-22
  Filled 2023-03-16: qty 28, 14d supply, fill #0

## 2023-03-16 MED FILL — ATORVASTATIN 80 MG TABLET: 80 mg | ORAL | 14 days supply | Qty: 14 | Fill #1 | Status: CP

## 2023-03-16 MED FILL — MIDODRINE 2.5 MG TABLET: 2.5 mg | ORAL | 14 days supply | Qty: 42 | Fill #1 | Status: CP

## 2023-03-16 MED FILL — ESCITALOPRAM 10 MG TABLET: 10 mg | ORAL | 14 days supply | Qty: 14 | Fill #1 | Status: CP

## 2023-03-16 MED FILL — OMEPRAZOLE 20 MG CAPSULE,DELAYED RELEASE: 20 mg | ORAL | 14 days supply | Qty: 14 | Fill #1 | Status: CP

## 2023-03-16 MED FILL — CLOPIDOGREL 75 MG TABLET: 75 mg | ORAL | 14 days supply | Qty: 14 | Fill #1 | Status: CP

## 2023-03-16 MED FILL — NITROGLYCERIN 0.4 MG SUBLINGUAL TABLET: 0.4 mg | SUBLINGUAL | 8 days supply | Qty: 25 | Fill #1 | Status: CP

## 2023-03-16 MED FILL — IPRATROPIUM BROMIDE 21 MCG (0.03 %) NASAL SPRAY: 21 mcg (0.03 %) | NASAL | 50 days supply | Qty: 30 | Fill #1 | Status: CP

## 2023-03-16 NOTE — Utilization Review (ED)
 Utilization Review from XsolisPatient Data:  Patient Name: Mark [name] Age: 89 y.o. DOB: 03/05/1925	 MRN: [id number]	 Indicated Status: Observation Review Comments: Continue Observation services. Physical therapy consulted, awaiting insurance authorization

## 2023-03-16 NOTE — Other
 Hospitalist Discharge from Observation NotePatient Data:  Patient Name: Mark [name] Age: 89 y.o. DOB: 05-Feb-1926	 MRN: [id number]	 PCP: No primary care provider on file. Admit date: 1/14/2025Discharge date: 1/16/2025Primary Diagnosis: Chest pain, unspecified typeBrief Observation Course:Patient seen and examined on day of discharge.Mark [name]  is stable for release from OBSERVATION to short term rehab  following this presentation for chest pain#Chest pain#Hx CAD-reportedly left sided, radiating to back, patient unable to recall episode-cardiac work up on admission including ECG without ischemic changes, troponins elevated but without delta and iso CKD-had just been admitted thus D dimer checked and noted to be elevated, VQ done to rule out PE--low probability-he just had a SPECT within last 6 months--no reversible ischemia, only scar-continue ASA, plavix, crestor-advised dtr of negative work up, suggested follow up with Dr. Frutoso Chase, especially if he has any further chest pain episodesPhysical Exam on Discharge:BP (!) 125/58  - Pulse 71  - Temp 97.9 ?F (36.6 ?C) (Oral)  - Resp 18  - Ht 5' 11 (1.803 m)  - Wt 88.1 kg  - SpO2 97%  - BMI 27.09 kg/m? Physical ExamGen: NAD, resting in bed, well developed, well nourishedHEENT: NCAT, normal conjunctiva, no scleral icterusNECK: supple, ROM normalCV: RR, normal S1, S2Pulm: Normal effort, no respiratory distress, CTA b/l, no wheezing or ralesAbd: soft, NTND, normoactive bowel soundsMSK: no edema noted, no deformitiesNeuro: AAOx3Skin: warm and dry Laboratory Evaluation:Recent Labs Lab 01/14/250951 NA 142 K 4.5 CL 109* CO2 21 BUN 38* CREATININE 2.60* GLU 103* CALCIUM 8.4* Recent Labs Lab 01/14/250951 WBC 5.6 HGB 8.4* HCT 27.90* PLT 234 No results for input(s): APTT, INR, PTT in the last 168 hours. Recent Labs   01/14/250951 01/14/251124 01/14/251850 TROPTHS 70* 74* 81* Diagnostics:NM Lung Ventilation PerfusionResult Date: 1/15/2025NM LUNG VENTILATION PERFUSION [facility]) performed on 03/15/2023 9:56 AM INDICATION: 89 years old Male; Chest pain, nonspecific. Upon chart review, this is a 89 year old male with past medical history most significant for hypertension, CAD, and CKD currently admitted for left chest/back pain without shortness of breath or palpitations and AKI on CKD does not have an elevated d-dimer concerning for pulmonary was obtained. TECHNIQUE: Following the inhalation of 40  mCi of Tc26m DTPA aerosol, imaging of the lungs was performed in the anterior, posterior, bilateral anterior oblique and bilateral posterior oblique projections. Subsequently, an intravenous dose of 4 mCi of Tc46m MAA was administered and imaging of the lungs was performed in the same projections.  FINDINGS: There is slightly heterogenous distribution of radiotracer on ventilation and perfusion imaging was likely to reflective of other chronic morbidities versus suboptimal inspiratory effort. Bilateral matched perihilar filling defect is likely to reflect prominent central pulmonary vasculature. There are no mismatched defects to indicate a high probability of pulmonary embolism. Low probability of pulmonary embolism. Report initiated by:  Kirk Ruths, MD Reported and signed by: Lucile Shutters, MD  [facility] Radiology and Biomedical Imaging XR Chest PA or APResult Date: 1/14/2025XR CHEST PA OR AP INDICATION: Chest Pain COMPARISON: 12/18/2022 FINDINGS:   The cardiomediastinal silhouette is normal. Mild pulmonary edema and bibasilar atelectatic changes in poor inspiration. The pleural spaces are clear. There is no acute osseous injury.  Multiple pulmonary edema and bibasilar atelectatic changes. [facility] Notify System Classification: Routine. Reported and signed by: Dulce Sellar, MD  [facility] Radiology and Biomedical Imaging  Discharge Medications:Current Discharge Medication List  CONTINUE these medications which have NOT CHANGED  Details acetaminophen (TYLENOL) 325 mg tablet Take 2 tablets (650 mg total) by mouth every 6 (six) hours as  needed for pain.Qty: 30 tablet, Refills: 0  aspirin 81 mg EC delayed release tablet Take 1 tablet (81 mg total) by mouth daily.  atorvastatin (LIPITOR) 80 mg tablet Take 1 tablet (80 mg total) by mouth daily.Qty: 14 tablet, Refills: PRN  b complex vitamins capsule Take 1 capsule by mouth daily.  bismuth subsalicylate (PEPTO BISMOL) 262 mg chewable tablet Take 1 tablet (262 mg total) by mouth 4 (four) times daily as needed.  camphor-menthoL (SARNA) 0.5-0.5 % lotion Apply topically twice daily as needed to back for itchingQty: 222 mL, Refills: PRN  clopidogreL (PLAVIX) 75 mg tablet Take 1 tablet (75 mg total) by mouth daily.Qty: 14 tablet, Refills: PRN  diclofenac (VOLTAREN) 1 % gel Apply to left big toe topically 4 times daily as needed for arthritis  escitalopram oxalate (LEXAPRO) 10 mg tablet Take 1 tablet (10 mg total) by mouth daily.Qty: 14 tablet, Refills: PRN  gabapentin (NEURONTIN) 100 mg capsule Take 2 capsules (200 mg total) by mouth at bedtime.  ipratropium (ATROVENT) 21 mcg (0.03 %) nasal spray Use 2 sprays in each nostril 2 (two) times daily.Qty: 30 mL, Refills: PRN  ketoconazole (NIZORAL) 2 % cream Apply topically to buttock twice dailyQty: 60 g, Refills: PRN  midodrine (PROAMATINE) 2.5 mg tablet Take 1 tablet by mouth three times a day for hypotension with meals.  do not take any doses later than evening meal or less than 4 hours before bedtimeQty: 42 tablet, Refills: PRN  multivitamin tablet Take 1 tablet by mouth daily.  nutritional supplement-fiber Liqd Take 120 mLs by mouth as needed (for constipation. Give on 7th shift if no BM).  omeprazole (PRILOSEC) 20 mg capsule Take 1 capsule (20 mg total) by mouth daily.Qty: 14 capsule, Refills: PRN  polyethylene glycol (GLYCOLAX; MIRALAX) 17 gram/dose powder Take 17 g by mouth daily.  senna (SENOKOT) 8.6 mg tablet Take 2 tablets (17.2 mg total) by mouth every evening before dinner.  sodium bicarbonate 650 mg tablet Take 1 tablet (650 mg total) by mouth 3 (three) times daily.  nitroGLYCERIN (NITROSTAT) 0.4 mg SL tablet give 1 tablet sublingually every 5 minutes as needed for chest pain. If no relief, call provider. May repeat every 5 minutes for a total of 3 tablets.Qty: 25 tablet, Refills: PRN   Follow-up Information:[facility] Alonna Buckler [facility] [id number] [facility] [phone] Appointments Date Time Provider Department Center 04/03/2023 11:30 AM Theresa Mulligan, APRN CARD [facility] Cardio 05/18/2023 11:20 AM [name]., MD CARD [facility] Cardio Time spent seeing and examining the patient, coordinating care, counseling on discharge instructions and completing discharge summary: greater than 30 minutesErin Aarini Slee, MD1/16/202510:42 AM

## 2023-03-16 NOTE — Plan of Care
 Plan of Care Overview/ Patient Status{Watt Scipio was discharged via [facility] illustrator) accompanied by Alone.  Acknowledged understanding of discharge instructionsand recommended follow up care as per the after visit summary.  Written discharge instructions provided. Denies any further questions. Vital signs    Vitals:  03/16/23 0400 03/16/23 0532 03/16/23 0610 03/16/23 0925 BP: (!) 101/56 (!) 93/48 (!) 98/52 (!) 125/58 Pulse: 68 75 72 71 Resp: 17 16 17 18  Temp: 97.8 ?F (36.6 ?C) 97.5 ?F (36.4 ?C) 97.8 ?F (36.6 ?C) 97.9 ?F (36.6 ?C) TempSrc: Oral Oral Oral Oral SpO2: 95% 96% 95% 97% Weight:     Height:     Report given to Francene Boyers, Charity fundraiser at [place] center.Opportunity provided for questionsPatient confirmed all belongings returned. Belongings charted in last 7 days: Patient Valuables   Patient Valuables Flowsheet                    PATIENT VALUABLE(S)       Jewelry disposition At bedside/locker/closet 03/14/23 1417  Vision Disposition At bedside/locker/closet 03/14/23 1417   Clothing Disposition At bedside/locker/closet 03/14/23 1417  HEARING AID Disposition At bedside/locker/closet 03/14/23 1417

## 2023-03-16 NOTE — Utilization Review (ED)
 Utilization Review from XsolisPatient Data:  Patient Name: Mark [name] Age: 89 y.o. DOB: 01/27/1926	 MRN: [id number]	 Indicated Status: Observation Review Comments: Continue Observation services. Discharge order in Epic.

## 2023-03-16 NOTE — Plan of Care
 Plan of Care Overview/ Patient StatusPt alert and O x 4. Still awaiting chair car pick up.  14:00 Call placed to daughter [name] as requested to inform of Pt ride enroute to pick Pt up.

## 2023-03-16 NOTE — Plan of Care
 Plan of Care Overview/ Patient StatusPatient lying on left side  in bed.  Patient is aaox 4, he denies any chest pain and respiratory distress.  Chronic foley catheter attached to patient appropriately.   Clinic tech and RN. Provided hygiene and peri-care to patient and linen change completed.  .  Patient is wearing right ear hearing aid, and amplifier in patient lap.  Patient is requesting his medication for the night, advise patient will review mar and vital signs, then administer med.

## 2023-03-16 NOTE — Care Coordination-Inpatient
 INSURANCE AUTH OBTAINED FOR STR 03/15/23 1157 Authorization Information Date Authorization Initiated:  03/15/23 Time Authorization Initiated: 1157 Mode Clinical was sent: Portal Facility Name [facility] Authorization Yes/ CM Carney Bern updated Insurance Company [facility] [employer] Medicare Insurance Co. Contact Name/# UHC/Home and [facility] Authorization #/Details #; [id number], [id number], 6 Days, 1/15-1/20 Date Authorization recieved 03/16/23 Time Authorization recieved 0949 Follow up contact necessary Yes Contact Name Home and Community Contact phone: [phone] [facility] NilanTransition CoordinatorCare Management DepartmentYale-New [facility] [place], [place] Phone: [phone]

## 2023-03-16 NOTE — Plan of Care
 Plan of Care Overview/ Patient StatusPatient status unchanged . No acute events noted

## 2023-03-16 NOTE — Utilization Review (ED)
 Utilization Review from XsolisPatient Data:  Patient Name: Mark [name] Age: 89 y.o. DOB: Nov 16, 1925	 MRN: [id number]	 Indicated Status: Observation Review Comments: remains stable, pending insurance auth for STR

## 2023-03-16 NOTE — Plan of Care
 Plan of Care Overview/ Patient StatusCase Management Plan  Flowsheet Row Most Recent Value Discharge Planning  Patient/Patient Representative goals/treatment preferences for discharge are:  return to [facility] Patient/Patient Representative was presented with a list of facilities, agencies and/or dme providers and Referral(s) placed for: Short term rehabilitation (at a nursing facility) Facility/Geographical Preference(s) [facility] Bed has been secured and is available on: 03/16/23 Facility Name [facility] Mode of Transportation  Ambulance (add comment for special considerations) Transportation scheduled for (date)  03/16/23 Transportation Company [facility] Designated Discharge Caregiver 1  Name designated caregiver susan lavana Phone number designated caregiver [phone] Relationship Patient Designated Caregiver Daughter CM D/C Readiness  PASRR completed and approved N/A Authorization number obtained, if required Yes Is there a 3 day INPATIENT Qualifying stay for Medicare Patients? N/A Medicare IM- signed, dated, timed and scanned, if required N/A DME Authorized/Delivered N/A No needs identified/ follow up with PCP/MD N/A Post acute care services secured W10 complete Yes Pri Completed and Accepted  N/A Is the destination address correct on the W10 Yes Finalized Plan  Expected Discharge Date 03/16/23 Discharge Disposition Skilled Nursing Facility  Wyline Beady, BSN RNED Care ManagerYale West [facility] Hospital HospitalY[facility] MHB no. [phone]

## 2023-03-16 NOTE — Plan of Care
 Plan of Care Overview/ Patient StatusNo acute event with this patient. He is lying on left  side in bed sleeping, head  of bed 45 degrees.  Displays no signs and symptoms of pain and respiratory distress

## 2023-03-17 ENCOUNTER — Encounter: Admit: 2023-03-17 | Payer: PRIVATE HEALTH INSURANCE | Primary: Internal Medicine

## 2023-03-17 DIAGNOSIS — I10 Essential (primary) hypertension: Secondary | ICD-10-CM

## 2023-03-17 DIAGNOSIS — M4800 Spinal stenosis, site unspecified: Secondary | ICD-10-CM

## 2023-03-17 DIAGNOSIS — K219 Gastro-esophageal reflux disease without esophagitis: Secondary | ICD-10-CM

## 2023-03-17 DIAGNOSIS — D509 Iron deficiency anemia, unspecified: Secondary | ICD-10-CM

## 2023-03-17 DIAGNOSIS — I2510 Atherosclerotic heart disease of native coronary artery without angina pectoris: Secondary | ICD-10-CM

## 2023-03-17 DIAGNOSIS — Z955 Presence of coronary angioplasty implant and graft: Secondary | ICD-10-CM

## 2023-03-17 DIAGNOSIS — F419 Anxiety disorder, unspecified: Secondary | ICD-10-CM

## 2023-03-17 DIAGNOSIS — G8929 Other chronic pain: Secondary | ICD-10-CM

## 2023-03-17 DIAGNOSIS — E785 Hyperlipidemia, unspecified: Secondary | ICD-10-CM

## 2023-03-17 DIAGNOSIS — I5030 Unspecified diastolic (congestive) heart failure: Secondary | ICD-10-CM

## 2023-03-17 DIAGNOSIS — I129 Hypertensive chronic kidney disease with stage 1 through stage 4 chronic kidney disease, or unspecified chronic kidney disease: Secondary | ICD-10-CM

## 2023-03-17 DIAGNOSIS — N401 Enlarged prostate with lower urinary tract symptoms: Secondary | ICD-10-CM

## 2023-03-17 DIAGNOSIS — I714 Abdominal aortic aneurysm (AAA) without rupture, unspecified part (HC Code): Secondary | ICD-10-CM

## 2023-03-17 DIAGNOSIS — I252 Old myocardial infarction: Secondary | ICD-10-CM

## 2023-03-17 DIAGNOSIS — R079 Chest pain, unspecified: Secondary | ICD-10-CM

## 2023-03-17 DIAGNOSIS — N400 Enlarged prostate without lower urinary tract symptoms: Secondary | ICD-10-CM

## 2023-03-17 DIAGNOSIS — Z978 Presence of other specified devices: Secondary | ICD-10-CM

## 2023-03-17 DIAGNOSIS — K5900 Constipation, unspecified: Secondary | ICD-10-CM

## 2023-03-17 DIAGNOSIS — I5032 Chronic diastolic (congestive) heart failure: Secondary | ICD-10-CM

## 2023-03-17 DIAGNOSIS — N184 Chronic kidney disease, stage 4 (severe): Secondary | ICD-10-CM

## 2023-03-20 ENCOUNTER — Other Ambulatory Visit: Admit: 2023-03-20 | Payer: PRIVATE HEALTH INSURANCE | Attending: Geriatric Medicine | Primary: Internal Medicine

## 2023-03-20 DIAGNOSIS — I236 Thrombosis of atrium, auricular appendage, and ventricle as current complications following acute myocardial infarction: Secondary | ICD-10-CM

## 2023-03-20 DIAGNOSIS — N401 Enlarged prostate with lower urinary tract symptoms: Principal | ICD-10-CM

## 2023-03-20 DIAGNOSIS — E785 Hyperlipidemia, unspecified: Secondary | ICD-10-CM

## 2023-03-20 DIAGNOSIS — N184 Chronic kidney disease, stage 4 (severe): Secondary | ICD-10-CM

## 2023-03-20 DIAGNOSIS — I200 Unstable angina: Secondary | ICD-10-CM

## 2023-03-20 DIAGNOSIS — K709 Alcoholic liver disease, unspecified: Secondary | ICD-10-CM

## 2023-03-20 DIAGNOSIS — K219 Gastro-esophageal reflux disease without esophagitis: Secondary | ICD-10-CM

## 2023-03-20 DIAGNOSIS — F419 Anxiety disorder, unspecified: Secondary | ICD-10-CM

## 2023-03-21 LAB — BASIC METABOLIC PANEL
BKR ANION GAP: 12 (ref 7–17)
BKR BLOOD UREA NITROGEN: 43 mg/dL — ABNORMAL HIGH (ref 8–23)
BKR BUN / CREAT RATIO: 15.4 (ref 8.0–23.0)
BKR CALCIUM: 8.3 mg/dL — ABNORMAL LOW (ref 8.8–10.2)
BKR CHLORIDE: 107 mmol/L (ref 98–107)
BKR CO2: 19 mmol/L — ABNORMAL LOW (ref 20–30)
BKR CREATININE DELTA: 0.2
BKR CREATININE: 2.8 mg/dL — ABNORMAL HIGH (ref 0.40–1.30)
BKR EGFR, CREATININE (CKD-EPI 2021): 20 mL/min/{1.73_m2} — ABNORMAL LOW (ref >=60)
BKR GLUCOSE: 131 mg/dL — ABNORMAL HIGH (ref 70–100)
BKR POTASSIUM: 4.7 mmol/L (ref 3.3–5.3)
BKR SODIUM: 138 mmol/L (ref 136–144)

## 2023-03-21 LAB — CBC WITH AUTO DIFFERENTIAL
BKR WAM ABSOLUTE IMMATURE GRANULOCYTES.: 0.03 x 1000/ÂµL (ref 0.00–0.30)
BKR WAM ABSOLUTE LYMPHOCYTE COUNT.: 0.9 x 1000/ÂµL (ref 0.60–3.70)
BKR WAM ABSOLUTE NRBC (2 DEC): 0 x 1000/ÂµL (ref 0.00–1.00)
BKR WAM ANC (ABSOLUTE NEUTROPHIL COUNT): 5.37 x 1000/ÂµL (ref 2.00–7.60)
BKR WAM BASOPHIL ABSOLUTE COUNT.: 0.05 x 1000/ÂµL (ref 0.00–1.00)
BKR WAM BASOPHILS: 0.7 % (ref 0.0–1.4)
BKR WAM EOSINOPHIL ABSOLUTE COUNT.: 0.68 x 1000/ÂµL (ref 0.00–1.00)
BKR WAM EOSINOPHILS: 8.9 % — ABNORMAL HIGH (ref 0.0–5.0)
BKR WAM HEMATOCRIT (2 DEC): 26.7 % — ABNORMAL LOW (ref 38.50–50.00)
BKR WAM HEMOGLOBIN: 8 g/dL — ABNORMAL LOW (ref 13.2–17.1)
BKR WAM IMMATURE GRANULOCYTES: 0.4 % (ref 0.0–1.0)
BKR WAM LYMPHOCYTES: 11.7 % — ABNORMAL LOW (ref 17.0–50.0)
BKR WAM MCH (PG): 23.5 pg — ABNORMAL LOW (ref 27.0–33.0)
BKR WAM MCHC: 30 g/dL — ABNORMAL LOW (ref 31.0–36.0)
BKR WAM MCV: 78.3 fL — ABNORMAL LOW (ref 80.0–100.0)
BKR WAM MONOCYTE ABSOLUTE COUNT.: 0.65 x 1000/ÂµL (ref 0.00–1.00)
BKR WAM MONOCYTES: 8.5 % (ref 4.0–12.0)
BKR WAM MPV: 10.1 fL (ref 8.0–12.0)
BKR WAM NEUTROPHILS: 69.8 % (ref 39.0–72.0)
BKR WAM NUCLEATED RED BLOOD CELLS: 0 % (ref 0.0–1.0)
BKR WAM PLATELETS: 198 x1000/ÂµL (ref 150–420)
BKR WAM RDW-CV: 15.9 % — ABNORMAL HIGH (ref 11.0–15.0)
BKR WAM RED BLOOD CELL COUNT.: 3.41 M/ÂµL — ABNORMAL LOW (ref 4.00–6.00)
BKR WAM WHITE BLOOD CELL COUNT: 7.7 x1000/ÂµL (ref 4.0–11.0)

## 2023-03-22 ENCOUNTER — Encounter: Admit: 2023-03-22 | Payer: PRIVATE HEALTH INSURANCE | Attending: Internal Medicine | Primary: Internal Medicine

## 2023-03-22 DIAGNOSIS — I129 Hypertensive chronic kidney disease with stage 1 through stage 4 chronic kidney disease, or unspecified chronic kidney disease: Secondary | ICD-10-CM

## 2023-03-22 DIAGNOSIS — I5032 Chronic diastolic (congestive) heart failure: Secondary | ICD-10-CM

## 2023-03-22 DIAGNOSIS — R079 Chest pain, unspecified: Secondary | ICD-10-CM

## 2023-03-22 DIAGNOSIS — N401 Enlarged prostate with lower urinary tract symptoms: Secondary | ICD-10-CM

## 2023-03-22 MED ORDER — MIDODRINE 2.5 MG TABLET
2.5 | ORAL_TABLET | 1 refills | Status: SS
Start: 2023-03-22 — End: 2023-05-17

## 2023-03-22 MED ORDER — ESCITALOPRAM 10 MG TABLET
10 | ORAL_TABLET | Freq: Every day | ORAL | 1 refills | 90.00000 days | Status: AC
Start: 2023-03-22 — End: 2023-06-07

## 2023-03-22 MED ORDER — GABAPENTIN 100 MG CAPSULE
100 | ORAL_CAPSULE | Freq: Every evening | ORAL | 1 refills | 30.00000 days | Status: AC
Start: 2023-03-22 — End: 2023-06-07

## 2023-03-22 MED ORDER — OMEPRAZOLE 20 MG CAPSULE,DELAYED RELEASE
20 | ORAL_CAPSULE | Freq: Every day | ORAL | 1 refills | 90.00000 days | Status: AC
Start: 2023-03-22 — End: 2023-06-07

## 2023-03-22 MED ORDER — ATORVASTATIN 80 MG TABLET
80 | ORAL_TABLET | Freq: Every day | ORAL | 1 refills | 90.00000 days | Status: AC
Start: 2023-03-22 — End: 2023-06-07

## 2023-03-22 MED ORDER — SODIUM BICARBONATE 650 MG TABLET
650 | ORAL_TABLET | Freq: Three times a day (TID) | ORAL | 1 refills | 22.50000 days | Status: AC
Start: 2023-03-22 — End: 2023-06-07

## 2023-03-22 MED ORDER — CLOPIDOGREL 75 MG TABLET
75 | ORAL_TABLET | Freq: Every day | ORAL | 1 refills | 90.00000 days | Status: AC
Start: 2023-03-22 — End: 2023-06-07

## 2023-04-03 ENCOUNTER — Ambulatory Visit: Admit: 2023-04-03 | Payer: PRIVATE HEALTH INSURANCE | Attending: Cardiovascular Disease | Primary: Internal Medicine

## 2023-04-13 ENCOUNTER — Ambulatory Visit: Admit: 2023-04-13 | Payer: PRIVATE HEALTH INSURANCE | Attending: Cardiovascular Disease | Primary: Internal Medicine

## 2023-04-13 ENCOUNTER — Encounter: Admit: 2023-04-13 | Payer: PRIVATE HEALTH INSURANCE | Attending: Cardiovascular Disease | Primary: Internal Medicine

## 2023-04-13 DIAGNOSIS — M4800 Spinal stenosis, site unspecified: Secondary | ICD-10-CM

## 2023-04-13 DIAGNOSIS — D509 Iron deficiency anemia, unspecified: Secondary | ICD-10-CM

## 2023-04-13 DIAGNOSIS — I2510 Atherosclerotic heart disease of native coronary artery without angina pectoris: Secondary | ICD-10-CM

## 2023-04-13 DIAGNOSIS — Z09 Encounter for follow-up examination after completed treatment for conditions other than malignant neoplasm: Secondary | ICD-10-CM

## 2023-04-13 DIAGNOSIS — N184 Chronic kidney disease, stage 4 (severe): Secondary | ICD-10-CM

## 2023-04-13 DIAGNOSIS — I10 Essential (primary) hypertension: Secondary | ICD-10-CM

## 2023-04-13 DIAGNOSIS — K219 Gastro-esophageal reflux disease without esophagitis: Secondary | ICD-10-CM

## 2023-04-13 DIAGNOSIS — N400 Enlarged prostate without lower urinary tract symptoms: Secondary | ICD-10-CM

## 2023-04-13 DIAGNOSIS — E785 Hyperlipidemia, unspecified: Secondary | ICD-10-CM

## 2023-04-13 DIAGNOSIS — I5030 Unspecified diastolic (congestive) heart failure: Secondary | ICD-10-CM

## 2023-04-13 NOTE — Patient Instructions
-   Continue current medications including aspirin 81 mg daily, atorvastatin 80 mg daily, Plavix 75 mg daily, midodrine 2.5 mg 3 times daily, nitroglycerin 0.4 mg as needed for chest pain (see bottle for admin instructions)- Follow up as scheduled with Dr. Frutoso Chase 05/18/2023- Call with any questions, concerns, or new symptoms

## 2023-04-13 NOTE — Progress Notes
 [facility] CenterCardiovascular DiseaseProgress NoteJames [name] is a 89 y.o. male followed in cardiology by Dr. Frutoso Chase with PMHx of CAD s/p remote IMI and stenting to RCA and LCX, with residual 50% stenosis of LAD on 2019 LHC at [facility], HTN, carotid artery disease s/p L CEA, abdominal aortic aneurysm. He was hospitalized from 09/14/22-09/15/22, after presenting to the ED with urinary retention and abdominal pain. He was found to have a UTI. A [place] scan was performed that shows an increase in size of his known AAA to 5.2 cm x 4.5 cm. However, images were reviewed by Dr. Dellis Anes at [facility], who felt aneurysm was no bigger than 4.5cm. He did not recommend any intervention. He last saw Dr. Frutoso Chase on 11/17/22, at which point SOB and unstable gait were reported. Pt was taking double his lasix dose (20mg  BID) per the advice of another provider.  He was advised to reduce his Lasix dose to 20 mg once daily with the understanding that if he developed edema or shortness of breath, he would resume 20 mg twice daily. Renal ultrasound showed moderate left and mild right hydronephrosis which has not significantly changed from September 17, 2022.  He was hospitalized 12/18/2022 to 12/22/2022 after a fall.  He was diagnosed with AKI on CKD.  Creatinine down trended in the setting of Foley placement.  He was discharged to a short-term rehab facility.  Plan was made for Lasix to be restarted by PCP after AKI resolved.He was admitted again from 03/14/23-03/16/23 after presentation to the emergency room with left-sided chest pain that radiated to back.  Improved with ASA and SL nitro. EKG was without ischemic changes troponins were elevated (70s-80s) but without delta and in setting of CKD.  D-dimer was slightly elevated but V/Q was done to rule out PE and showed low probability.  Since SPECT performed 10/19/2022 showed only scarring as perfusion defect and CP had resolved, he was advised to follow up outpatient.Mark [name] presents to my office with his daughter Mark [name] for a hospital follow-up.The chest pain resolved in the emergency room with nitroglycerin and aspirin, and there has been no recurrence since discharge. This was his first episode of chest pain in a while.He has a history of low blood pressure, which has improved with medication changes made earlier this year in the hospital. He is currently taking midodrine and sodium bicarbonate three times a day, which has improved his blood pressure and reduced symptoms of weakness and dizziness. He feels stronger and more active, able to walk down stairs with assistance.He experiences occasional shortness of breath, not necessarily with exertion and not associated with other symtpoms. No recent DOE noted when climbing the stairs. He denies chest pain, palpitations, edema, orthopnea, PND, syncope, pre-syncope, lightheadedness, dizziness, falls.   Problem list:Patient Active Problem List  Diagnosis Date Noted  Chronic heart failure with preserved ejection fraction (HC Code) (HC CODE) (HC Code) 03/17/2023  Foley catheter in place 03/17/2023  Benign prostatic hyperplasia with urinary retention 03/17/2023  Chest pain, unspecified type 03/14/2023  Fall, initial encounter 12/18/2022  AKI (acute kidney injury) (HC Code) 12/18/2022  Urinary retention 09/14/2022  Allergic rhinitis 07/24/2019  Chronic sinusitis 07/24/2019  Essential hypertension 07/24/2019  Iron deficiency anemia 07/24/2019  Low back pain 07/24/2019  Mixed hyperlipidemia 07/24/2019  Other thalassemia (HC Code) (HC CODE) (HC Code) 07/24/2019   Sep 10, 2001 Entered By: Glenford Peers Comment: Beta thalassemia minor  Acute cholecystitis 03/11/2019  Cholecystitis 03/10/2019  Bilateral hearing loss 01/28/2019  Impaired fasting glucose  08/02/2016  Carotid artery stenosis 06/07/2016  History of coronary artery stent placement 06/07/2016  Vertigo 07/10/2015  AAA (abdominal aortic aneurysm) (HC Code) 05/05/2015   Formatting of this note might be different from the original.2016 3.6 cm [place] in [place], 2018 4 cm, no further f/u recommended by Vasc  CAD (coronary artery disease) 05/05/2015  Dyslipidemia 05/05/2015  Gastroesophageal reflux disease without esophagitis 05/05/2015  Hypertensive kidney disease with stage 4 chronic kidney disease (HC Code) (HC CODE) (HC Code) 05/05/2015  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  Spinal stenosis 05/05/2015 Medications:Current Outpatient Medications Medication Sig Dispense Refill  acetaminophen (TYLENOL) 325 mg tablet Take 2 tablets (650 mg total) by mouth every 6 (six) hours as needed for pain. 30 tablet 0  aspirin 81 mg EC delayed release tablet Take 1 tablet (81 mg total) by mouth daily.    atorvastatin (LIPITOR) 80 mg tablet Take 1 tablet (80 mg total) by mouth daily. 30 tablet 0  b complex vitamins capsule Take 1 capsule by mouth daily.    clopidogreL (PLAVIX) 75 mg tablet Take 1 tablet (75 mg total) by mouth daily. 30 tablet 0  diclofenac (VOLTAREN) 1 % gel Apply to left big toe topically 4 times daily as needed for arthritis    escitalopram oxalate (LEXAPRO) 10 mg tablet Take 1 tablet (10 mg total) by mouth daily. 30 tablet 0  gabapentin (NEURONTIN) 100 mg capsule Take 2 capsules (200 mg total) by mouth at bedtime. 60 capsule 0  ipratropium (ATROVENT) 21 mcg (0.03 %) nasal spray Use 2 sprays in each nostril 2 (two) times daily. 30 mL PRN  midodrine (PROAMATINE) 2.5 mg tablet Take 1 tablet by mouth three times a day for hypotension with meals.  do not take any doses later than evening meal or less than 4 hours before bedtime 90 tablet 0  multivitamin tablet Take 1 tablet by mouth daily.    nitroGLYCERIN (NITROSTAT) 0.4 mg SL tablet give 1 tablet sublingually every 5 minutes as needed for chest pain. If no relief, call provider. May repeat every 5 minutes for a total of 3 tablets. 25 tablet PRN  omeprazole (PRILOSEC) 20 mg capsule Take 1 capsule (20 mg total) by mouth daily. 30 capsule 0  polyethylene glycol (GLYCOLAX; MIRALAX) 17 gram/dose powder Take 17 g by mouth daily.    senna (SENOKOT) 8.6 mg tablet Take 2 tablets (17.2 mg total) by mouth every evening before dinner.    sodium bicarbonate 650 mg tablet Take 1 tablet (650 mg total) by mouth 3 (three) times daily. 90 tablet 0 No current facility-administered medications for this visit.  Allergies:He is allergic to ace inhibitors.Review of Systems Constitutional: Negative for chills and fever. Cardiovascular:  Negative for chest pain, claudication, cyanosis, dyspnea on exertion, irregular heartbeat, leg swelling, near-syncope, orthopnea, palpitations, paroxysmal nocturnal dyspnea and syncope. Respiratory:  Positive for shortness of breath. Negative for cough.  Gastrointestinal:  Negative for nausea and vomiting. Neurological:  Negative for dizziness, headaches, light-headedness and weakness.  Vital Signs: BP (!) 116/56  - Pulse 75  - Ht 5' 11 (1.803 m)  - BMI 27.43 kg/m?  Wt Readings from Last 3 Encounters: 03/22/23 89.2 kg 03/17/23 89.8 kg 03/14/23 88.1 kg BP Readings from Last 3 Encounters: 03/22/23 133/68 03/17/23 128/69 03/16/23 110/67  Pulse Readings from Last 3 Encounters: 03/22/23 69 03/17/23 75 03/16/23 68 Physical ExamGEN: Pleasant, alert, in no apparent distress.HEENT: Anicteric. NECK:  No JVD visualized, no carotid bruit on the right but bruit noted on the left.CV: Regular rate  and rhythm.  Clear S1 and S2 with 2/6 SEM, no rubs or gallops.LUNG: Lungs are clear to auscultation bilaterally.  No wheezes/rales/rhonchi. EXTREM: No lower extremity edema, 2+ DP & PT pulses bilaterally. Warm distal extremities.NEURO: Mood and affect appropriate.  A/o x 4.Labs:Lipid Panel:Lab Results Component Value Date  CHOL 106 07/31/2019  TRIG 67 07/31/2019  HDL 41 07/31/2019  LDL 51 96/05/5407 Chemistry Lab Results Component Value Date  NA 138 03/21/2023  K 4.7 03/21/2023  CL 107 03/21/2023  CO2 19 (L) 03/21/2023  BUN 43 (H) 03/21/2023  CREATININE 2.80 (H) 03/21/2023  PHOS 3.3 02/27/2023  CALCIUM 8.3 (L) 03/21/2023  Lab Results Component Value Date  ALT 20 03/14/2023  AST 22 03/14/2023  ALKPHOS 86 03/14/2023  BILITOT 0.4 03/14/2023  EKG: 02/13/25Sinus rhythm with PAC at 75bpmNo acute ischemic ST changesECHO:  Results for orders placed or performed during the hospital encounter of 02/24/23 Echocardiogram Complete (TTE) Result Value Ref Range  Reported 3DE EF% 60 %  Narrative   * Normal left ventricular size, systolic function and wall motion. Mild concentric left ventricular hypertrophy.  LVEF calculated by 3DE was 60%.  Abnormal tissue Doppler suggestive of abnormal diastolic function.* Normal right ventricular cavity size and systolic function.  Tricuspid regurgitation envelope is inadequate for estimation of right ventricular systolic pressure.* Atria are normal in size.  No interatrial shunt by color Doppler.* Trileaflet aortic valve.  Calcified aortic valve.  Low flow low gradient mild aortic stenosis with preserved ejection fraction.  Peak aortic velocity is 2.4 m/s.  Mean gradient is 11 mmHg.  Aortic valve area is 1.6 cm2.  Dimensionless index is 0.50.  LVOT SV Index is 32 ml/m2.  Mild aortic regurgitation.* All visible segments of the aorta are normal in size.* IVC diameter < 2.1 cm that collapses > 50% with a sniff suggests normal RAP (0-5 mmHg, mean 3 mmHg).* No significant pericardial effusion.* Compared with the prior study, dated 01/01/2021, there are changes noted.  Aortic sclerosis has progressed to mild stenosis. Results for orders placed or performed during the hospital encounter of 01/01/21 Echo 2D Complete w Doppler and CFI if Ind Image Enhancement 3D and or bubbles Result Value Ref Range  Reported Biplane EF% 62 %  Narrative   * Normal left ventricular size and systolic function. Mild concentric left ventricular hypertrophy.  LVEF calculated by biplane Simpson's was 62%.  Abnormal tissue Doppler suggestive of abnormal diastolic function.* Normal right ventricular cavity size and systolic function.  Estimated right ventricular systolic pressure is 27 mmHg.* Atria are normal in size* Aortic valve is trileaflet.  Mild aortic valve calcification.  Aortic sclerosis without stenosis.  Peak aortic velocity is 1.6 m/s.  Mean gradient is 6 mmHg.  Dimensionless index is 0.70.  Trace aortic regurgitation.* Mitral valve appears calcified.  Mild anterior and moderate posterior mitral annular calcification.  No mitral regurgitation.  No mitral stenosis.* IVC diameter < 2.1 cm that collapses > 50% with a sniff suggests normal RAP (0-5 mmHg, mean 3 mmHg).* No significant pericardial effusion* No prior study available for comparison STRESS TEST: Results for orders placed or performed during the hospital encounter of 10/19/22 NM Myocardial Perfusion SPECT (Stress and Rest) with Regadenoson  Narrative   * 1) Abnormal SPECT myocardial perfusion imaging following pharmacologic vasodilation with regadenoson and at rest.* 2) Perfusion imaging was abnormal showing a small sized, moderate intensity, fixed perfusion defect in the basal to mid inferior wall consistent with scar.* 3) Global left ventricular systolic function was normal with abnormal  regional wall motion showing basal inferior hypokinesis.  The left ventricle was normal in size.* 4) Stress electrocardiogram was normal.  Peak stress blood pressure was normal at 126/62 mmHg.* 5) No prior study available for comparison.  ASSESSMENT:Mark [name] is a 89 y.o. male followed in cardiology by Dr. Frutoso Chase with PMHx of CAD s/p remote IMI and stenting to RCA and LCX, with residual 50% stenosis of LAD on 2019 LHC at [facility], HTN, carotid artery disease s/p L CEA, abdominal aortic aneurysm.   Chest Pain  A recent episode of chest pain resolved with nitroglycerin in the emergency room, with no recurrence since discharge. Possible coronary vasospasm is suspected due to improvement with nitroglycerin and a prior stress test showing scarring but no active ischemia. Continue current medications and use nitroglycerin as needed for chest pain. Notify the office if chest pain recurs more than twice for possible maintenance medication adjustment.Hypotension  Hypotension has improved with midodrine and sodium bicarbonate, with no current symptoms of lightheadedness or dizziness. Continue midodrine and sodium carbonate as prescribed.Follow up with primary care provider, Dr. Maudie Mercury, on May 18, 2023, for routine health maintenance.   PLAN:- Continue current medications including aspirin 81 mg daily, atorvastatin 80 mg daily, Plavix 75 mg daily, midodrine 2.5 mg 3 times daily, nitroglycerin 0.4 mg as needed for chest pain (see bottle for admin instructions)- Follow up as scheduled with Dr. Frutoso Chase 05/18/2023- Call with any questions, concerns, or new symptomsI encouraged the patient to call me with any questions or any worsening or new symptoms.It was a pleasure to participate in the care of this patient.Dictation device may have been used, please excuse any spelling, word substitution, or punctuation Roselie Skinner, MSN, FNP-CYale [facility] [address] [place], [place] [id number] [phone] Signed by Theresa Mulligan, APRN, April 13, 2023

## 2023-05-08 ENCOUNTER — Emergency Department: Admit: 2023-05-08 | Payer: PRIVATE HEALTH INSURANCE | Primary: Internal Medicine

## 2023-05-08 ENCOUNTER — Emergency Department: Admit: 2023-05-08 | Payer: PRIVATE HEALTH INSURANCE | Attending: Diagnostic Radiology | Primary: Internal Medicine

## 2023-05-08 ENCOUNTER — Inpatient Hospital Stay
Admit: 2023-05-08 | Discharge: 2023-05-17 | Payer: PRIVATE HEALTH INSURANCE | Attending: Internal Medicine | Admitting: Medical Oncology

## 2023-05-08 LAB — BASIC METABOLIC PANEL
BKR ANION GAP: 16 (ref 7–17)
BKR BLOOD UREA NITROGEN: 55 mg/dL — ABNORMAL HIGH (ref 8–23)
BKR BUN / CREAT RATIO: 14.9 (ref 8.0–23.0)
BKR CALCIUM: 8.9 mg/dL (ref 8.8–10.2)
BKR CHLORIDE: 102 mmol/L (ref 98–107)
BKR CO2: 19 mmol/L — ABNORMAL LOW (ref 20–30)
BKR CREATININE DELTA: 0.88 — ABNORMAL HIGH
BKR CREATININE: 3.68 mg/dL — ABNORMAL HIGH (ref 0.40–1.30)
BKR EGFR, CREATININE (CKD-EPI 2021): 15 mL/min/{1.73_m2} — ABNORMAL LOW (ref >=60)
BKR GLUCOSE: 140 mg/dL — ABNORMAL HIGH (ref 70–100)
BKR POTASSIUM: 4.7 mmol/L (ref 3.3–5.3)
BKR SODIUM: 137 mmol/L (ref 136–144)

## 2023-05-08 LAB — SARS-COV-2 (COVID-19)/INFLUENZA A+B/RSV BY RT-PCR (BH GH LMW YH)
BKR INFLUENZA A: NEGATIVE
BKR INFLUENZA B: NEGATIVE
BKR RESPIRATORY SYNCYTIAL VIRUS: NEGATIVE
BKR SARS-COV-2 RNA (COVID-19) (YH): NEGATIVE

## 2023-05-08 LAB — URINALYSIS WITH CULTURE REFLEX      (BH LMW YH)
BKR BILIRUBIN, UA: NEGATIVE
BKR GLUCOSE, UA: NEGATIVE
BKR KETONES, UA: NEGATIVE
BKR NITRITE, UA: NEGATIVE
BKR PH, UA: 8 — ABNORMAL HIGH (ref 5.5–7.5)
BKR SPECIFIC GRAVITY, UA: 1.013 (ref 1.005–1.030)
BKR UROBILINOGEN, UA (MG/DL): <2 mg/dL (ref <=2.0)

## 2023-05-08 LAB — MAGNESIUM: BKR MAGNESIUM: 2.2 mg/dL (ref 1.7–2.4)

## 2023-05-08 LAB — CBC WITH AUTO DIFFERENTIAL
BKR WAM ABSOLUTE IMMATURE GRANULOCYTES.: 0.05 x 1000/ÂµL (ref 0.00–0.30)
BKR WAM ABSOLUTE LYMPHOCYTE COUNT.: 1.53 x 1000/ÂµL (ref 0.60–3.70)
BKR WAM ABSOLUTE NRBC (2 DEC): 0 x 1000/ÂµL (ref 0.00–1.00)
BKR WAM ANC (ABSOLUTE NEUTROPHIL COUNT): 5.62 x 1000/ÂµL (ref 2.00–7.60)
BKR WAM BASOPHIL ABSOLUTE COUNT.: 0.03 x 1000/ÂµL (ref 0.00–1.00)
BKR WAM BASOPHILS: 0.4 % (ref 0.0–1.4)
BKR WAM EOSINOPHIL ABSOLUTE COUNT.: 0.25 x 1000/ÂµL (ref 0.00–1.00)
BKR WAM EOSINOPHILS: 3 % (ref 0.0–5.0)
BKR WAM HEMATOCRIT (2 DEC): 30.9 % — ABNORMAL LOW (ref 38.50–50.00)
BKR WAM HEMOGLOBIN: 9.5 g/dL — ABNORMAL LOW (ref 13.2–17.1)
BKR WAM IMMATURE GRANULOCYTES: 0.6 % (ref 0.0–1.0)
BKR WAM LYMPHOCYTES: 18.1 % (ref 17.0–50.0)
BKR WAM MCH (PG): 21.8 pg — ABNORMAL LOW (ref 27.0–33.0)
BKR WAM MCHC: 30.7 g/dL — ABNORMAL LOW (ref 31.0–36.0)
BKR WAM MCV: 71 fL — ABNORMAL LOW (ref 80.0–100.0)
BKR WAM MONOCYTE ABSOLUTE COUNT.: 0.95 x 1000/ÂµL (ref 0.00–1.00)
BKR WAM MONOCYTES: 11.3 % (ref 4.0–12.0)
BKR WAM MPV: 10.5 fL (ref 8.0–12.0)
BKR WAM NEUTROPHILS: 66.6 % (ref 39.0–72.0)
BKR WAM NUCLEATED RED BLOOD CELLS: 0 % (ref 0.0–1.0)
BKR WAM PLATELETS: 257 x1000/ÂµL (ref 150–420)
BKR WAM RDW-CV: 14.6 % (ref 11.0–15.0)
BKR WAM RED BLOOD CELL COUNT.: 4.35 M/ÂµL (ref 4.00–6.00)
BKR WAM WHITE BLOOD CELL COUNT: 8.4 x1000/ÂµL (ref 4.0–11.0)

## 2023-05-08 LAB — HEPATIC FUNCTION PANEL
BKR A/G RATIO: 1.1 (ref 1.0–2.2)
BKR ALANINE AMINOTRANSFERASE (ALT): 24 U/L (ref 9–59)
BKR ALBUMIN: 3.6 g/dL (ref 3.6–5.1)
BKR ALKALINE PHOSPHATASE: 83 U/L (ref 9–122)
BKR ASPARTATE AMINOTRANSFERASE (AST): 29 U/L (ref 10–35)
BKR AST/ALT RATIO: 1.2
BKR BILIRUBIN DIRECT: 0.2 mg/dL (ref <=0.2)
BKR BILIRUBIN TOTAL: 0.5 mg/dL (ref <=1.2)
BKR GLOBULIN: 3.3 g/dL (ref 2.0–3.9)
BKR PROTEIN TOTAL: 6.9 g/dL (ref 5.9–8.3)

## 2023-05-08 LAB — BLOOD GAS, VENOUS (BH YH)
BKR BASE EXCESS, VENOUS: 1 mmol/L
BKR CALCULATED HCO3, VENOUS: 22.2 mmol/L
BKR LITER FLOW (VENOUS BG COLL QUESTION): 2 L/min
BKR O2 SATURATION VENOUS: 15 %
BKR PCO2, VENOUS: 26 mmHg — ABNORMAL LOW (ref 38–54)
BKR PH, VENOUS: 7.54 U — ABNORMAL HIGH (ref 7.32–7.43)
BKR PO2, VENOUS: <30 mmHg — ABNORMAL LOW (ref 40–50)
BKR TEMPERATURE (VENOUS BG COLL QUESTION): 97.4 [degF]

## 2023-05-08 LAB — URINE MICROSCOPIC     (BH GH LMW YH)
BKR RBC/HPF INSTRUMENT: 214 /HPF — ABNORMAL HIGH (ref 0–2)
BKR WBC/HPF INSTRUMENT: 317 /HPF — ABNORMAL HIGH (ref 0–5)

## 2023-05-08 LAB — TROPONIN T HIGH SENSITIVITY, 3 HOUR (BH GH LMW YH)
BKR TROPONIN T HS 1 HOUR DELTA FROM 0 HOUR ON 3HR: -29 ng/L
BKR TROPONIN T HS 3 HOUR DELTA FROM 0 HOUR: -28 ng/L
BKR TROPONIN T HS 3 HOUR: 145 ng/L — CR

## 2023-05-08 LAB — UA REFLEX CULTURE

## 2023-05-08 LAB — PARTIAL THROMBOPLASTIN TIME     (BH GH LMW Q YH): BKR PARTIAL THROMBOPLASTIN TIME: 26.8 s (ref 23.0–31.0)

## 2023-05-08 LAB — TROPONIN T HIGH SENSITIVITY, 1 HOUR WITH REFLEX (BH GH LMW YH)
BKR TROPONIN T HS 1 HOUR DELTA FROM 0 HOUR: -29 ng/L
BKR TROPONIN T HS 1 HOUR: 144 ng/L — CR

## 2023-05-08 LAB — PROTIME AND INR
BKR INR: 1.06 (ref 0.86–1.13)
BKR PROTHROMBIN TIME: 11.6 s (ref 9.6–12.3)

## 2023-05-08 LAB — LACTIC ACID, PLASMA (REFLEX 2H REPEAT): BKR LACTATE: 1.5 mmol/L (ref 0.5–2.2)

## 2023-05-08 LAB — TROPONIN T HIGH SENSITIVITY, 0 HOUR BASELINE WITH REFLEX (BH GH LMW YH): BKR TROPONIN T HS 0 HOUR BASELINE: 173 ng/L — CR

## 2023-05-08 LAB — NT-PROBNPE: BKR B-TYPE NATRIURETIC PEPTIDE, PRO (PROBNP): 7895 pg/mL — ABNORMAL HIGH (ref <450.0)

## 2023-05-08 MED ORDER — POLYETHYLENE GLYCOL 3350 17 GRAM ORAL POWDER PACKET
17 gram | Freq: Every day | ORAL | Status: DC
Start: 2023-05-08 — End: 2023-05-13
  Administered 2023-05-11 – 2023-05-13 (×3): 17 gram via ORAL

## 2023-05-08 MED ORDER — AMPICILLIN 1GM MBP
Freq: Three times a day (TID) | INTRAVENOUS | Status: DC
Start: 2023-05-08 — End: 2023-05-13
  Administered 2023-05-08 – 2023-05-13 (×14): 50.000 mL/h via INTRAVENOUS

## 2023-05-08 MED ORDER — HYDROCODONE 5 MG-ACETAMINOPHEN 325 MG TABLET
5-325 | Freq: Four times a day (QID) | ORAL | Status: SS | PRN
Start: 2023-05-08 — End: ?

## 2023-05-08 MED ORDER — GABAPENTIN 100 MG CAPSULE
100 | Freq: Every evening | ORAL | Status: CP
Start: 2023-05-08 — End: ?
  Administered 2023-05-08 – 2023-05-16 (×9): 100 mg via ORAL

## 2023-05-08 MED ORDER — ASPIRIN 81 MG TABLET,DELAYED RELEASE
81 | Freq: Every day | ORAL | Status: CP
Start: 2023-05-08 — End: ?
  Administered 2023-05-09 – 2023-05-17 (×9): 81 mg via ORAL

## 2023-05-08 MED ORDER — ROSUVASTATIN 40 MG TABLET
40 | Freq: Every day | ORAL | Status: CP
Start: 2023-05-08 — End: ?
  Administered 2023-05-09 – 2023-05-17 (×9): 40 mg via ORAL

## 2023-05-08 MED ORDER — CLOPIDOGREL 75 MG TABLET
75 | Freq: Every day | ORAL | Status: CP
Start: 2023-05-08 — End: ?
  Administered 2023-05-09 – 2023-05-17 (×9): 75 mg via ORAL

## 2023-05-08 MED ORDER — SODIUM BICARBONATE 650 MG TABLET
650 | Freq: Three times a day (TID) | ORAL | Status: CP
Start: 2023-05-08 — End: ?
  Administered 2023-05-08 – 2023-05-17 (×26): 650 mg via ORAL

## 2023-05-08 MED ORDER — MIDODRINE 2.5 MG TABLET
2.5 | Freq: Three times a day (TID) | ORAL | Status: AC
Start: 2023-05-08 — End: ?

## 2023-05-08 MED ORDER — LACTATED RINGERS IV BOLUS NEW BAG (DROPS CHARGE)
Freq: Once | INTRAVENOUS | Status: CP
Start: 2023-05-08 — End: ?
  Administered 2023-05-08: 13:00:00 1000.000 mL/h via INTRAVENOUS

## 2023-05-08 MED ORDER — LACTATED RINGERS IV BOLUS NEW BAG (DROPS CHARGE)
Freq: Once | INTRAVENOUS | Status: CP
Start: 2023-05-08 — End: ?
  Administered 2023-05-08: 12:00:00 1000.000 mL/h via INTRAVENOUS

## 2023-05-08 MED ORDER — SODIUM CHLORIDE 0.9 % LARGE VOLUME SYRINGE FOR AUTOINJECTOR
Freq: Once | INTRAVENOUS | Status: CP | PRN
Start: 2023-05-08 — End: ?
  Administered 2023-05-08: 14:00:00 via INTRAVENOUS

## 2023-05-08 MED ORDER — FAMOTIDINE 20 MG TABLET
20 | Freq: Two times a day (BID) | ORAL | Status: DC
Start: 2023-05-08 — End: 2023-05-09
  Administered 2023-05-08: 20:00:00 20 mg via ORAL

## 2023-05-08 MED ORDER — IOHEXOL 350 MG IODINE/ML INTRAVENOUS SOLUTION
350 | Freq: Once | INTRAVENOUS | Status: CP | PRN
Start: 2023-05-08 — End: ?
  Administered 2023-05-08: 14:00:00 350 mL via INTRAVENOUS

## 2023-05-08 MED ORDER — FAMOTIDINE 20 MG TABLET
20 | Freq: Every evening | ORAL | Status: CP
Start: 2023-05-08 — End: ?
  Administered 2023-05-09 – 2023-05-16 (×8): 20 mg via ORAL

## 2023-05-08 MED ORDER — DIPHTH,PERTUSSIS(ACEL),TETANUS 2.5 LF UNIT-8 MCG-5 LF/0.5ML IM SYRINGE
2.5-8-5--0.5 | Freq: Once | INTRAMUSCULAR | Status: CP
Start: 2023-05-08 — End: ?
  Administered 2023-05-08: 13:00:00 2.5-8-5--0.5 mL via INTRAMUSCULAR

## 2023-05-08 MED ORDER — ESCITALOPRAM 10 MG TABLET
10 | Freq: Every day | ORAL | Status: CP
Start: 2023-05-08 — End: ?
  Administered 2023-05-09 – 2023-05-17 (×9): 10 mg via ORAL

## 2023-05-08 MED ORDER — ACETAMINOPHEN 325 MG TABLET
325 | Freq: Four times a day (QID) | ORAL | Status: CP | PRN
Start: 2023-05-08 — End: ?
  Administered 2023-05-11 – 2023-05-12 (×2): 325 mg via ORAL

## 2023-05-08 NOTE — ED Provider Notes
 Chief Complaint Patient presents with  Fall greater than 89 years old   Unwitnessed fall in the BR, heard by  his daughter.  Also fell yesterday.  Increasing weakness per his daughter.  Daughter told EMS pt. Usually gets like this when he has a UTI.  No head strike or LOC.   On plavix daily.  C collared.   Slightly altered per daughter.  Lac to L hand.  No deformities.  Foley in place. HPI/PE:89 year old male with a PMH notable for HFpEF, CKD, CAD, HTN, HLD, who presents to the emergency department after an unwitnessed fall at home.  It was heard by his daughter at home.  Unclear head strike, loss of consciousness, but patient was not on anticoagulation.MDM:--------------------------------------------------------------------------------------------------------------------------------Medical Decision MakingDifferential diagnoses:  UTI, ICH, cervical spine fracture, PE, AAA ruptureInitial interventions:1L of LRED course:Initially, patient was very dyspneic despite no evidence of pulmonary edema on chest x-ray and clear breath sounds.  For this reason, we spoke to him in his daughter about the risks and benefits of getting a CTA chest PE to rule out a pulmonary embolism, which they agreed to.I am aware that this patient's GFR is reduced. However, at this time I feel that the theoretical risk of AKI post contrast is worth the risk of missing critical pathology due to not using contrast. In addition, there has been no concrete evidence showing a direct link between the use of contrast and nephropathy. I believe that in this patient, a delay in diagnosis would cause more harm. This has been discussed with the attending physician. Please UUV:OZDGUY RD, Westafer LM, Boxen JL et al.  Acute Kidney Injury after [employer]:  A meta-analysis.  Annals of Emergency Medicine 2018 PMID 40347425.9  Maida Sale, Jalbout NA, Ehmann MR et al.  Acute kidney injury following contrast media administration in the septic patient:  A retrospective propensity-matched analysis.  Journal of Critical Care 2019 PMID 56387564.3  Anner Crete JS.  Contrast-associated acute kidney injury is a myth: Yes.  Intensive Care Medicine 2017 PMID 32951884.22Upon independent review of his imaging, he did not have evidence of PE or AAA rupture on CTs.  Given his high-risk syncope, we will admit him to Medicine with telemetry.  Additionally, his UA was consistent with a UTI, so we will treat him empirically with ampicillin, which the Enterococcus in his prior urine culture was susceptible to.Evaluated with Dr. Carla Drape, MDPGY3 Emergency MedicineAvailable through MHB12:56 PM This note may have been generated using M*Modal voice dictation software please excuse any typographical errors due to flaws in voice recognition.--------------------------------------------------------------------------------------------------------------------------------An acute or life threatening problem was considered during this evaluation  A decision regarding hospitalization was made during this visit  External data reviewed: Notes (OSH or non-ED)Independent interpretation of: [place], ECG and XRay  Physical ExamED Triage Vitals [05/08/23 0959]BP: 115/69Pulse: 74Pulse from  O2 sat: n/aResp: 18Temp: 97.4 ?F (36.3 ?C)Temp src: OralSpO2: 99 % BP (!) 144/55  - Pulse 70  - Temp 98.3 ?F (36.8 ?C) (Temporal)  - Resp 16  - SpO2 100% Physical ExamConstitutional:     Appearance: He is not ill-appearing. HENT:    Head: Normocephalic and atraumatic.    Right Ear: External ear normal.    Left Ear: External ear normal.    Mouth/Throat:    Mouth: Mucous membranes are moist. Eyes:    Conjunctiva/sclera: Conjunctivae normal.    Pupils: Pupils are equal, round, and reactive to light. Cardiovascular:    Rate and Rhythm: Normal rate and regular rhythm. Pulmonary:  Effort: Pulmonary effort is normal.    Breath sounds: Normal breath sounds. No stridor. No wheezing. Abdominal:    Palpations: Abdomen is soft.    Tenderness: There is no abdominal tenderness. There is no guarding. Musculoskeletal:       General: No deformity.    Cervical back: Normal range of motion. Skin:   General: Skin is warm.    Capillary Refill: Capillary refill takes less than 2 seconds.    Findings: Lesion (Linear 4 cm laceration over anterior aspect of left 4th digit) present. No rash. Neurological:    General: No focal deficit present.    Mental Status: He is alert. Mental status is at baseline. Psychiatric:       Mood and Affect: Mood normal.       Behavior: Behavior normal.  Lac RepairDate/Time: 05/08/2023 4:29 PMPerformed by: Theodoro Grist, MDAuthorized by: Cruz Condon, MD  Time out documented by nurse: noTime out unable to be performed due to emergent nature of procedure:  NoTiming: the time out is initiated prior to the beginning of the procedure:  YesName of patient and medical record or DOB stated and matches ID band or previously confirmed medical record number.:  YesProceduralist states or confirms the procedure to be performed:  YesThe procedural consent is used to verify the procedure to be performed and it matches the patient identifiers:  N/ASite of procedure(s) (with laterality or level) is topically marked per policy and visible after draping:  N/A(IF SITE NOT MARKED) Radiographic imaging is present or a laterality side/site band is present on patient and is accessible:  N/AAnesthesia:   Anesthesia method:  Local infiltration  Local anesthetic:  Lidocaine 1% w/o epiLaceration details:   Location:  Finger  Finger location:  L long finger  Length (cm):  3Repair type:   Repair type:  SimpleExploration:   Contaminated: no  Treatment:   Area cleansed with:  Saline  Amount of cleaning:  Standard  Irrigation solution:  Sterile saline  Irrigation method:  Pressure washSkin repair:   Repair method:  Sutures  Suture size:  4-0  Suture material:  Nylon  Suture technique:  Simple interrupted  Number of sutures:  5Approximation:   Approximation:  Close  Vermilion border: well-aligned  Post-procedure details:   Dressing:  Open (no dressing).Attestation/Critical CarePatient Reevaluation: Attending Supervised: ResidentI saw and examined the patient as necessary. I agree with the findings and plan of care as documented in the resident's note except as noted below. Additional acute problems and updates documented in real time in ED course.MDM: 89 y.o. male patient presenting with significant hx CKD, CAD on plavix p/w unwitnessed fall in the bathroom, doesn't remember if he passed out or hit his head, denies pain today. Dyspneic on evaluation, also w/ laceration to L index finger.  Known AAA, plan for AAA ultrasound,  head, xrays 2/2 fall, close reassessment.  Highest c/f PNA, PE, pulmonary edema. I reviewed previous notes if availableAn acute or life threatening diagnosis was considered during this evaluation.Considered hospitalization Otherwise provided routine care.Irving Burton [place]==Attending Supervised: ResidentI saw and examined the patient. I agree with the findings and plan of care as documented in the resident's note except as noted below. Additional acute and/or chronic problems addressed: Prabhjot Maddux SangalProcedure(s): I was present & supervised during the key portions of the procedure. Key portions include wound assessmentReceived sign-out from previous teamOn my review of  scan no overt free airAdmitted per prior planNo events prior to inpatient coverageComments as of 05/08/23 2100 Mon May 08, 2023 1059  Plan to start HFNC for significant dyspnea, tachypnea and increased O2 requirement.  No signs of increased RV strain or plethoric IVC on POCUS, mild B lines only on R side, not left. Lungs clear to auscultation. Awaiting GFR, will have SDM conversation about CTPE as highest c/f pneumonia vs. PE at present time [EJ] 1217 Creatinine(!): 3.68AKI on CKD, pt appears clinically dry [EJ] 1217 High Sensitivity Troponin T(!!): 173Will trend [EJ] 1217 NT-proBNP(!): 7,895.0Above baseline [EJ] 1254 CXR (portable)No acute cardiothoracic abnormality. [EJ] 1254 PelvisNo acute osseous injury. [EJ] 1254 Shared decision-making conversation had, as no other clear source of significant dyspnea and hypoxia, patient amenable to getting a [place] PE.  Benefits outweigh risks of [place] contrast at present time. [EJ] 1255 Dyspnea and hypoxia, patient [EJ] 1300 1 hour Delta from 0 Hour, HS-Troponin T: -29 [EJ] 1310 Leukocytes, UA(!): 4+ [EJ] 1310 Blood, UA(!): 3+Micro in process [EJ] 1413 WBC/HPF(!): 317 [EJ] 1413 Bacteria, UA(!): FewWill cover for UTI [EJ] 1413  Head Cervical Spine wo IV Contrast1. No evidence of acute intracranial abnormality.  2. No evidence for acute cervical spine fracture or traumatic subluxation. [EJ] 1855 Assumed care of Jaryd Drew from the previous provider at this time while patient is boarding in the emergency department pending transfer of care responsibilities to in-patient team. Please see original/previous provider's note for additional details.  In very brief summary, Nemiah Kissner is a 89 y.o. male with history of AAA, CKD, chronic foley w possible infection admitted to medicine floor for fall/syncope w shortness of breath, who received fluids, trial of HFNC then downtrended (s/p SDU eval, cleared for floor), and got abx. Per discussion at sign-out, there are no remaining tasks or interventions requiring urgent attention while under the care of our emergency department team. Evaluation and plan already established by prior team- unless otherwise noted, no significant changes to be made. [BC]  Comments User Index[BC] Jacklynn Ganong, MD[EJ] De Nurse, MD   Clinical Impressions as of 05/08/23 2100 Cystitis Shortness of breath AKI (acute kidney injury) [facility] Code)  ED DispositionAdmit  De Nurse, MD03/10/25 1103 Theodoro Grist, MDResident03/10/25 1256 De Nurse, MD03/10/25 1436 Theodoro Grist, MDResident03/10/25 1630 Theodoro Grist, MDResident03/10/25 2019 Cruz Condon, MD03/10/25 2100

## 2023-05-08 NOTE — ED Notes
 11:10 AMChief Complaint Patient presents with  Fall greater than 89 years old   Unwitnessed fall in the BR, heard by  his daughter.  Also fell yesterday.  Increasing weakness per his daughter.  Daughter told EMS pt. Usually gets like this when he has a UTI.  No head strike or LOC.   On plavix daily.  C collared.   Slightly altered per daughter.  Lac to L hand.  No deformities.  Foley in place. Verbal report received from off-going RN. This RN assumed care of pt at this time. Per RN report, pt c/o unwitnessed fall at home. Pt continues to have increased work of breathing on 4 L nasal cannula.11:50 AMPt placed on HFNC at 60% 30 L by RT.12:10 PMFoley catheter exchanged. Additional imaging ordered to r/o PE.1:40 PMPt brought to [place] and xray on non-rebreather and full monitor. Accompanied by RN.1:55 PMProvider at bedside, updated daughter. Pt resting comfortably on stretcher.2:30 PMPt taken off HFNC- maintaining oxygen saturations on room air. 2:45 PMStep-down providers at bedside for evaluation.3:47 PMPlan for pt to be admitted. Provider at bedside to place stitches in L middle finger laceration.5:55 PMPt moved to EX12. Prior to moving pt given something to eat or drink. Report given to RN.

## 2023-05-08 NOTE — ED Notes
 3:37 PM Floor Handoff Telemetry: 	[]  Yes		[x]  NoCode Status:   [x]  Full		[]  DNR		[]  DNI		Other (specify):Safety Precautions: [x]  Fall Risk  []  Sitter   []  Restraints	[]  Suicidal	[]  None	Other (specify):Mentation/Orientation:	 A&O (Self, person, place, time) x    3      	 Disoriented to:                    	 Special Accommodations: [x]  Hearing impaired   []  Blind  []  Nonverbal  []  Cognitive impairmentOxygenation Upon Admission: [x]  RA	[]  NC	[]  Venti  []  Simple Mask []  Other	Baseline O2 Status? []  Yes	[x]  NoAmbulation: []  Independent	[]  Cane   []  Walker	[]  Wheelchair	[x]  Bedbound		[]  Hemiplegic	[]  Paraplegic	[]  QuadraplegicEliminiation: []  Independent	[]  Commode	[]  Bedpan/Urinal  []  Straight Cath [x]  Foley cath Chronic*			[]  Urostomy	[]  Colostomy	Other (specify):Diarrhea/Loose stool : []  1x within 24h  []  2x within 24h  []  3x within 24h  [x]  None 	C.Diff Order: 	[]  Ordered- needs to be collected             []  Collected-sent to lab             []  Resulted - Negative C.Diff             []  Resulted - Positive C.Diff[]  Not Ordered   [x]  N/ASkin Alteration: []  Pressure Injury [x]  Wound []  None []  Skin not assessedDiet: [x]  Regular/No order placed	[]  NPO		Other (specify):IV Access: [x]  PIV   []  PICC    []  Port    [] Central line    []  A-line    Other (specify)IVF/GTT Running Upon ED Departure? [x]  No	    []  Yes (specify):Outstanding Meds/Treatments/Tests:Sanaya Gwilliam Nemiah Commander, RN

## 2023-05-08 NOTE — Other
 SDU Evaluation NoteRelevant history:89 year old male with a past medical history notable for half path, CKD, CAD, HTN, HLD who presented after a witnessed mechanical fall in bathroom.  Was found to be tachypneic in the ED RR in the 30s and placed on HFNC with subsequent improvement and tachypnea now removed and on room air.Consult called for:  Tachypnea, need for HFNC On evaluation:Notable labs include bicarb 19, creatinine 3.68 (BL 2.3?), Elevated BNP of 7.9 K, troponin 173, 144, 145, hemoglobin 9.5, VBG 7.54, 26, UA with 4+ leukocytes, nitrate negative, proteinuriaCXR no acute cardiothoracic abnormality.  [place] head and C-spine without acute injury.  CTA PE no evidence of PE and mild pulmonary edema.  [place] abdomen pelvis with IV contrast with hydronephrosis, urinary bladder wall thickening, findings suggestive of chronic urinary retention versus infection.Patient stated he feels fine.  He denies any chest pain or dyspnea.  Feels that he is breathing comfortably at his baseline.  No dizziness or lightheadedness.Exam:T 99.7?, HR 70, RR 22, BP 158/58, 99% on RANo acute distress, oriented to person and place (hospital) and month but not year and specific hospital,  lungs clear to auscultation bilaterally, systolic murmur heard at left sternal border, abdomen soft, nondistended, no suprapubic tenderness to palpation, no peripheral edemaA/P:Overall patient's tachypnea seems to be improving and he is breathing comfortably.  Should his tachypnea acutely worsen or change can consider getting a repeat VBG.  No evidence of PE or intrapulmonary pathology apparent on current imaging.  Possible component of mild overload with elevated proBNP.  No evidence of hypoxia on room air.-VBG if tachypnea worsens or acutely changes or concern for AMS -can consider TTE -does not meet criteria for SDU at this time Disposition: Does not meet SDU need at this time. Please call if patient's condition changes  SDU Resident Lynette Topete A Qureshi3/11/2023 3:30 PM

## 2023-05-08 NOTE — H&P
 [facility]	 Medicine History & PhysicalHistory provided by: the patientHistory limited by: no limitationsPatient presents from: HomeSubjective: Chief Complaint: FallHPI 89 year old male with a PMH notable for half path, CKD, CAD, HTN, HLD who presented to the ED after an unwitnessed mechanical fall in the bathroom.  He lives with his daughter at his house and has been having increasing weakness.  No complaint of head strike or loss of consciousness but he had laceration to his left hand.  In the ED left hand laceration was sutured and he was started on ampicillin for UTI.Medical History: PMH PSH Past Medical History: Diagnosis Date  (HFpEF) heart failure with preserved ejection fraction (HC Code) (HC CODE) (HC Code)   BPH (benign prostatic hyperplasia)   Chronic coronary artery disease   CKD (chronic kidney disease) stage 4, GFR 15-29 ml/min (HC Code) (HC CODE) (HC Code)   GERD (gastroesophageal reflux disease)   Hyperlipidemia   Hypertension   Microcytic anemia   Spinal stenosis   Past Surgical History: Procedure Laterality Date  APPENDECTOMY    BLADDER SURGERY  03/01/2019  CHOLECYSTECTOMY    FRACTURE SURGERY    rifgt hand   Family History family history includes Heart disease in his mother.Social History  reports that he has quit smoking. He has never been exposed to tobacco smoke. He has never used smokeless tobacco. He reports that he does not currently use alcohol. He reports that he does not use drugs.Prior to Admission Medications (Not in a hospital admission) Allergies Allergies Allergen Reactions  Ace Inhibitors Cough  Review of Systems: Review of Systems All other systems were reviewed by me and were found to be negative.Objective: Vitals:I have reviewed the patient's current vital signs as documented in the patient's EMR.  Last 24 hours: Temp:  [97.4 ?F (36.3 ?C)-99.7 ?F (37.6 ?C)] 97.6 ?F (36.4 ?C)Pulse:  [64-76] 76Resp:  [17-34] 17BP: (114-159)/(52-69) 159/66SpO2:  [99 %-100 %] 99 %Physical Exam: Physical Exam Constitutional: well nourished.Eyes: Anicteric, EOMI.HENT: No ear or nasal discharge.Cardiovascular: S1S2 RRR.Resp: CTA B/L. GI: soft NT/ND. Neuro: AAO x 3. No motor deficits.Musculoskeletal:  Left 4th digit laceration with suturesDerm: No lesions or rashes. Psych: Normal affectLabs: I have reviewed the patient's labs within the last 24 hrs.Last 24 hours: Recent Results (from the past 24 hours) EKG  Collection Time: 05/08/23 10:23 AM Result Value Ref Range  Heart Rate 75 bpm  QRS Interval 85 ms  QT Interval 405 ms  QTC Interval 452 ms  P Axis 31 deg  QRS Axis -15 deg  T Wave Axis 34 deg  P-R Interval 206 msec  SEVERITY Abnormal ECG severity Hepatic function panel  Collection Time: 05/08/23 10:30 AM Result Value Ref Range  Total Bilirubin 0.5 <=1.2 mg/dL  Bilirubin, Direct 0.2 <=0.2 mg/dL  Alkaline Phosphatase 83 9 - 122 U/L  Alanine Aminotransferase (ALT) 24 9 - 59 U/L  Aspartate Aminotransferase (AST) 29 10 - 35 U/L  AST/ALT Ratio 1.2 Reference Range Not Established  Total Protein 6.9 5.9 - 8.3 g/dL  Albumin 3.6 3.6 - 5.1 g/dL  Globulin 3.3 2.0 - 3.9 g/dL  A/G Ratio 1.1 1.0 - 2.2 Magnesium  Collection Time: 05/08/23 10:30 AM Result Value Ref Range  Magnesium 2.2 1.7 - 2.4 mg/dL NT-proBrain natriuretic peptide  Collection Time: 05/08/23 10:30 AM Result Value Ref Range  NT-proBNP 7,895.0 (H) <450.0 pg/mL Troponin T High Sensitivity, Emergency; 0 hour baseline with reflex (1 hour and 3 hour)  Collection Time: 05/08/23 10:30 AM Result Value Ref Range  High  Sensitivity Troponin T 173 (HH) See Comment ng/L Symptomatic 4 [place] -Order only on patients highly likely to be admitted.  Collection Time: 05/08/23 10:30 AM  Specimen: Nasopharynx; Viral Result Value Ref Range  Influenza A Negative Negative  Influenza B Negative Negative  Respiratory Syncytial Virus Negative Negative  SARS-CoV-2 RNA (COVID-19)  Negative Negative Basic metabolic panel  Collection Time: 05/08/23 10:30 AM Result Value Ref Range  Sodium 137 136 - 144 mmol/L  Potassium 4.7 3.3 - 5.3 mmol/L  Chloride 102 98 - 107 mmol/L  CO2 19 (L) 20 - 30 mmol/L  Anion Gap 16 7 - 17  Glucose 140 (H) 70 - 100 mg/dL  BUN 55 (H) 8 - 23 mg/dL  Creatinine 1.61 (H) 0.96 - 1.30 mg/dL  Calcium 8.9 8.8 - 04.5 mg/dL  BUN/Creatinine Ratio 40.9 8.0 - 23.0  eGFR (Creatinine) 15 (L) >=60 mL/min/1.13m2  Creatinine Delta 0.88 (H) See Comment CBC auto differential  Collection Time: 05/08/23 10:30 AM Result Value Ref Range  WBC 8.4 4.0 - 11.0 x1000/?L  RBC 4.35 4.00 - 6.00 M/?L  Hemoglobin 9.5 (L) 13.2 - 17.1 g/dL  Hematocrit 81.19 (L) 14.78 - 50.00 %  MCV 71.0 (L) 80.0 - 100.0 fL  MCH 21.8 (L) 27.0 - 33.0 pg  MCHC 30.7 (L) 31.0 - 36.0 g/dL  RDW-CV 29.5 62.1 - 30.8 %  Platelets 257 150 - 420 x1000/?L  MPV 10.5 8.0 - 12.0 fL  Neutrophils 66.6 39.0 - 72.0 %  Lymphocytes 18.1 17.0 - 50.0 %  Monocytes 11.3 4.0 - 12.0 %  Eosinophils 3.0 0.0 - 5.0 %  Basophil 0.4 0.0 - 1.4 %  Immature Granulocytes 0.6 0.0 - 1.0 %  nRBC 0.0 0.0 - 1.0 %  Absolute Lymphocyte Count 1.53 0.60 - 3.70 x 1000/?L  Monocyte Absolute Count 0.95 0.00 - 1.00 x 1000/?L  Eosinophil Absolute Count 0.25 0.00 - 1.00 x 1000/?L  Basophil Absolute Count 0.03 0.00 - 1.00 x 1000/?L  Absolute Immature Granulocyte Count 0.05 0.00 - 0.30 x 1000/?L  Absolute nRBC 0.00 0.00 - 1.00 x 1000/?L  ANC (Abs Neutrophil Count) 5.62 2.00 - 7.60 x 1000/?L Blood gas, venous (BH Y[facility])  Collection Time: 05/08/23 10:30 AM Result Value Ref Range  pH, Venous 7.54 (H) 7.32 - 7.43 units  pCO2, Venous 26 (L) 38 - 54 mmHg  PO2, Venous <30 (L) 40 - 50 mmHg  O2 Sat, Venous 15 Not Established %  Calculated HCO3, Venous 22.2 Not Established mmol/L  Base Excess, Venous 1 Not Established mmol/L  Temperature, Venous 97.4 Fahrenheit  Liter Flow, Venous 2.0 L/min  Oxygen Mode, Venous Nasal Cannula  UA reflex to culture  Collection Time: 05/08/23 10:50 AM  Specimen: Urine Result Value Ref Range  Reflex Urine Culture See Comment  Urinalysis with culture reflex     (BH LMW Y[facility])  Collection Time: 05/08/23 10:50 AM  Specimen: Urine Result Value Ref Range  Clarity, UA Turbid (A) Clear  Color, UA Orange (A) Yellow, Colorless  Specific Gravity, UA 1.013 1.005 - 1.030  pH, UA 8.0 (H) 5.5 - 7.5  Protein, UA 2+ (A) Negative, Trace  Glucose, UA Negative Negative  Ketones, UA Negative Negative  Blood, UA 3+ (A) Negative  Bilirubin, UA Negative Negative  Leukocytes, UA 4+ (A) Negative  Nitrite, UA Negative Negative  Urobilinogen, UA <2.0 <=2.0 mg/dL Urine microscopic     (BH GH LMW YH)  Collection Time: 05/08/23 10:50 AM Result Value Ref Range  RBC/HPF, UA 214 (H) 0 -  2 /HPF  WBC/HPF, UA 317 (H) 0 - 5 /HPF  Bacteria, UA Few (A) None-Rare /HPF Type and screen  Collection Time: 05/08/23 10:53 AM Result Value Ref Range  ABO Grouping AB   Rh Type POS   Antibody Screen NEG   Specimen Expiration Date And Time 05/11/2023 23:59  Partial thromboplastin time  Collection Time: 05/08/23 10:53 AM Result Value Ref Range  PTT 26.8 23.0 - 31.0 seconds Protime-INR  Collection Time: 05/08/23 10:53 AM Result Value Ref Range  Prothrombin Time 11.6 9.6 - 12.3 seconds  INR 1.06 0.86 - 1.13 Troponin T High Sensitivity, 1 Hour With Reflex (BH GH LMW Y[facility])  Collection Time: 05/08/23 12:04 PM Result Value Ref Range  High Sensitivity Troponin T 144 (HH) See Comment ng/L  1 hour Delta from 0 Hour, HS-Troponin T -29 ng/L Troponin T High Sensitivity, 3 Hour (BH GH LMW YH)  Collection Time: 05/08/23  2:18 PM Result Value Ref Range  High Sensitivity Troponin T 145 (HH) See Comment ng/L  1 hour Delta from 0 Hour, HS-Troponin T -29 ng/L  3 hour Delta from 0 Hour, HS-Troponin T -28 ng/L Lactic acid, plasma (reflex 2h repeat)  Collection Time: 05/08/23  2:57 PM Result Value Ref Range  Lactate 1.5 0.5 - 2.2 mmol/L Diagnostics:[place] Cervical Spine wo IV Contrast 1. No evidence of acute intracranial abnormality.  2. No evidence for acute cervical spine fracture or traumatic subluxation. ECG/Tele Events: I have reviewed the patient's ECG as resulted in the EMR.NSR @ 75/minAssessment: 89 year old male with a PMH notable for half path, CKD, CAD, HTN, HLD who presented to the ED after an  unwitnessed mechanical fall in the bathroom. Principal Problem:  Cystitis  SNOMED [place](R): CYSTITIS  Plan: # unwitnessed mechanical fall Laceration to left 4th digitLaceration sutured in the EDCT head, neck with no acute abnormalityAdmit to telemetry# UTIPrior UTI grew EnterococcusStarted on ampicillin in the EDFollow repeat urine cultures# CKDMonitor lytes Continue with sodium bicarbonate t.i.d.# Diet: Regular diet.# Continue other long term home medications for other chronic diseases.# Medication reconciliation complete.# DVT prophylaxis with Heparin.# Full code. Notifications: PCP: Hipolito Bayley [phone]    I have personally discussed the plan with the patient and/or family. YesI have reviewed the plan of care with the bedside nurse, including an emphasis on the most important aspects of care for the next 12 hours: yes Electronically Signed:Laysa Kimmey Verna Czech, MD Hospitalist AttendingYale Dequincy [facility] was seen and examined by me on below mentioned date and time.05/08/2023, 7:38 PMAddendum: None

## 2023-05-08 NOTE — Utilization Review (ED)
 Utilization Review from XsolisPatient Data:  Patient Name: Mark [name] Age: 89 y.o. DOB: 04/05/1925	 MRN: [id number]	 Indicated Status: Inpatient Review Comments: Shortness of breath and new O2 requirement. also + for UTI , also acute AKI on CKD. creat 3.6 from 2.8.  Plan : IV abx , IVF , [place] of chest to r/u PE.   PCU consult noted.

## 2023-05-08 NOTE — ED Notes
 10:53 AM Pt received to A14 via EMS s/p unwitnessed fall in the bathroom. Unknown LOC or head strike. Pt was placed in c collar by EMS pta. Pt c/o pain to the neck from c collar. Denies any chest or back pain. +tachypnea with moderately labored breathing. Difficult to obtain pulse ox reading d/t poor circulation in the extremities. +laceration noted to the left hand. +foley cath present on arrival. Pt on plavix. Pt is very HOH but otherwise awake alert and oriented, able to answer questions appropriately. Pt placed on bedside monitor. Pt was incontinent of stool, pt cleaned changed and repositioned for comfort. PIV placed, labs collected and sent. Chief Complaint Patient presents with  Fall greater than 16 years old   Unwitnessed fall in the BR, heard by  his daughter.  Also fell yesterday.  Increasing weakness per his daughter.  Daughter told EMS pt. Usually gets like this when he has a UTI.  No head strike or LOC.   On plavix daily.  C collared.   Slightly altered per daughter.  Lac to L hand.  No deformities.  Foley in place. Past Medical History: Diagnosis Date  (HFpEF) heart failure with preserved ejection fraction (HC Code) (HC CODE) (HC Code)   BPH (benign prostatic hyperplasia)   Chronic coronary artery disease   CKD (chronic kidney disease) stage 4, GFR 15-29 ml/min (HC Code) (HC CODE) (HC Code)   GERD (gastroesophageal reflux disease)   Hyperlipidemia   Hypertension   Microcytic anemia   Spinal stenosis  Vitals:  05/08/23 0959 BP: 115/69 Pulse: 74 Resp: 18 Temp: 97.4 ?F (36.3 ?C) 11:06 AM Pt continues with difficulty breathing despite O2 3L via nasal cannula and HOB elevated. Provider at bedside to reassess, new orders received for High Flow NC. RT notified by ED charge of need for high flow. 6:09 PM Pt received to EX12. Pt is awake alert and oriented. Breathing is now regular and unlabored at rest on room air. No acute distress noted. Pt denies any cp or difficulty breathing. Pt given warm blanket and pillow for comfort. Foley cath draining clear yellow urine. Pending IP bed assignment. Daughter called, update given. 7:11 PM Pt given meal tray, eating and tolerating well.

## 2023-05-09 LAB — BASIC METABOLIC PANEL
BKR ANION GAP: 11 (ref 7–17)
BKR BLOOD UREA NITROGEN: 43 mg/dL — ABNORMAL HIGH (ref 8–23)
BKR BUN / CREAT RATIO: 14.3 (ref 8.0–23.0)
BKR CALCIUM: 7.3 mg/dL — ABNORMAL LOW (ref 8.8–10.2)
BKR CHLORIDE: 112 mmol/L — ABNORMAL HIGH (ref 98–107)
BKR CO2: 20 mmol/L (ref 20–30)
BKR CREATININE DELTA: -0.67
BKR CREATININE: 3.01 mg/dL — ABNORMAL HIGH (ref 0.40–1.30)
BKR EGFR, CREATININE (CKD-EPI 2021): 18 mL/min/{1.73_m2} — ABNORMAL LOW (ref >=60)
BKR GLUCOSE: 117 mg/dL — ABNORMAL HIGH (ref 70–100)
BKR POTASSIUM: 3.8 mmol/L (ref 3.3–5.3)
BKR SODIUM: 143 mmol/L (ref 136–144)

## 2023-05-09 LAB — CBC WITH AUTO DIFFERENTIAL
BKR WAM ABSOLUTE IMMATURE GRANULOCYTES.: 0.04 x 1000/ÂµL (ref 0.00–0.30)
BKR WAM ABSOLUTE LYMPHOCYTE COUNT.: 1.21 x 1000/ÂµL (ref 0.60–3.70)
BKR WAM ABSOLUTE NRBC (2 DEC): 0 x 1000/ÂµL (ref 0.00–1.00)
BKR WAM ANC (ABSOLUTE NEUTROPHIL COUNT): 5.73 x 1000/ÂµL (ref 2.00–7.60)
BKR WAM BASOPHIL ABSOLUTE COUNT.: 0.03 x 1000/ÂµL (ref 0.00–1.00)
BKR WAM BASOPHILS: 0.4 % (ref 0.0–1.4)
BKR WAM EOSINOPHIL ABSOLUTE COUNT.: 0.21 x 1000/ÂµL (ref 0.00–1.00)
BKR WAM EOSINOPHILS: 2.6 % (ref 0.0–5.0)
BKR WAM HEMATOCRIT (2 DEC): 26.3 % — ABNORMAL LOW (ref 38.50–50.00)
BKR WAM HEMOGLOBIN: 8.3 g/dL — ABNORMAL LOW (ref 13.2–17.1)
BKR WAM IMMATURE GRANULOCYTES: 0.5 % (ref 0.0–1.0)
BKR WAM LYMPHOCYTES: 14.9 % — ABNORMAL LOW (ref 17.0–50.0)
BKR WAM MCH (PG): 22.9 pg — ABNORMAL LOW (ref 27.0–33.0)
BKR WAM MCHC: 31.6 g/dL (ref 31.0–36.0)
BKR WAM MCV: 72.7 fL — ABNORMAL LOW (ref 80.0–100.0)
BKR WAM MONOCYTE ABSOLUTE COUNT.: 0.9 x 1000/ÂµL (ref 0.00–1.00)
BKR WAM MONOCYTES: 11.1 % (ref 4.0–12.0)
BKR WAM MPV: 10.1 fL (ref 8.0–12.0)
BKR WAM NEUTROPHILS: 70.5 % (ref 39.0–72.0)
BKR WAM NUCLEATED RED BLOOD CELLS: 0 % (ref 0.0–1.0)
BKR WAM PLATELETS: 212 x1000/ÂµL (ref 150–420)
BKR WAM RDW-CV: 14.5 % (ref 11.0–15.0)
BKR WAM RED BLOOD CELL COUNT.: 3.62 M/ÂµL — ABNORMAL LOW (ref 4.00–6.00)
BKR WAM WHITE BLOOD CELL COUNT: 8.1 x1000/ÂµL (ref 4.0–11.0)

## 2023-05-09 LAB — URINE CULTURE

## 2023-05-09 NOTE — ED Notes
 11:31 PM This RN assuming care of pt. Pt aaox3, gcs 15. Intermittent confusion but answering all questions appropriately. Pt denies any complaints of pain or discomfort at this time, plan for admission. Pt aware and accepting, will continue to monitor.

## 2023-05-09 NOTE — ED Notes
 7:39 AM report received from night shift-patient care assumed @ this timeNo acute distress noted

## 2023-05-09 NOTE — ED Notes
 11:24 PM Received report from previous RN. Per report, pt initially presented to the ED for a fall, found to have cystitis. Pt has chronic foley in place, draining with no issues. Pt pending admit.

## 2023-05-09 NOTE — ED Notes
 8:41 PM Report received, assumed care of pt, here for fall, found to have UTI, had lac repair, no other injuries, alert and oriented x4, speaking clearly in full sentences, has chronic foley. Pending admit bedChief Complaint Patient presents with  Fall greater than 89 years old   Unwitnessed fall in the BR, heard by  his daughter.  Also fell yesterday.  Increasing weakness per his daughter.  Daughter told EMS pt. Usually gets like this when he has a UTI.  No head strike or LOC.   On plavix daily.  C collared.   Slightly altered per daughter.  Lac to L hand.  No deformities.  Foley in place. Vitals:  05/09/23 1143 05/09/23 1521 05/09/23 1756 05/09/23 1949 BP: 130/66 129/65 (!) 144/64 (!) 149/65 Pulse: 74 68 74 74 Resp:   20 18 Temp: 97.9 ?F (36.6 ?C)  98.2 ?F (36.8 ?C) 97.9 ?F (36.6 ?C) TempSrc:   Oral Oral SpO2: 98% 99% 98% 99% Weight:     Past Medical History: Diagnosis Date  (HFpEF) heart failure with preserved ejection fraction (HC Code) (HC CODE) (HC Code)   BPH (benign prostatic hyperplasia)   Chronic coronary artery disease   CKD (chronic kidney disease) stage 4, GFR 15-29 ml/min (HC Code) (HC CODE) (HC Code)   GERD (gastroesophageal reflux disease)   Hyperlipidemia   Hypertension   Microcytic anemia   Spinal stenosis   Plan  Admit for UTI11:23 PM  Report given to oncoming RN, care transferred, no further questions or concerns at this time

## 2023-05-09 NOTE — ED Notes
 3:30 AM Assumed care at this time after report from previous RN. Pending admission bed for AKI and Cystitis. +chronic foley Chief Complaint Patient presents with  Fall greater than 89 years old   Unwitnessed fall in the BR, heard by  his daughter.  Also fell yesterday.  Increasing weakness per his daughter.  Daughter told EMS pt. Usually gets like this when he has a UTI.  No head strike or LOC.   On plavix daily.  C collared.   Slightly altered per daughter.  Lac to L hand.  No deformities.  Foley in place. 0330 Pt placed on Tele monitor per orders 0410 Given apple juice0700 Pt cleaned up, bedding changed, gown changed. 1610 Report given to on coming RN, care transferred at this time.

## 2023-05-09 NOTE — ED Notes
 8:36 PM pts hearing aids taken out and placed in bag and labeled and placed with belongings

## 2023-05-09 NOTE — Other
 PHARMACY-ASSISTED MEDICATION REPORTPharmacist review of the best possible medication history obtained by the pharmacy medication history technician has been performed.  I have updated the home medication list and identified the following information that may be relevant to this admission.  Sources utilized include daughter interview and Biomedical engineer. NOTES/RECOMMENDATIONSNone at this time       Prior to Admission Medications Medication Name Sig Taking? Patient Reported   aspirin 81 mg EC delayed release tabletLast dose:  --  Take 1 tablet (81 mg total) by mouth daily. Yes Yes   atorvastatin (LIPITOR) 80 mg tabletLast dose:  --  Take 1 tablet (80 mg total) by mouth daily. Yes     clopidogreL (PLAVIX) 75 mg tabletLast dose:  --  Take 1 tablet (75 mg total) by mouth daily. Yes     escitalopram oxalate (LEXAPRO) 10 mg tabletLast dose: Morning Take 1 tablet (10 mg total) by mouth daily. Yes     gabapentin (NEURONTIN) 100 mg capsuleLast dose:  --  Take 2 capsules (200 mg total) by mouth at bedtime. Yes     HYDROcodone-acetaminophen (NORCO) 5-325 mg per tabletLast dose:  -- Last Medication Note: >> Gracy Racer May 08, 2023  9:38 PMMedHX Tech(Amarylis Ellard Artis, CPHT): daughter reports patient takes ONLY as needed for severe pain, last filled 04/04/2023 # 15 days supplyEntered by Ellard Artis, Amarylis, CPHT Mon May 08, 2023 2138 Take 1 tablet by mouth every 6 (six) hours as needed for pain. Yes Yes   ipratropium (ATROVENT) 21 mcg (0.03 %) nasal sprayLast dose:  --  Use 2 sprays in each nostril 2 (two) times daily. Yes     midodrine (PROAMATINE) 2.5 mg tabletLast dose:  --  Take 1 tablet by mouth three times a day for hypotension with meals.  do not take any doses later than evening meal or less than 4 hours before bedtime Yes     nitroGLYCERIN (NITROSTAT) 0.4 mg SL tabletLast dose:  --  give 1 tablet sublingually every 5 minutes as needed for chest pain. If no relief, call provider. May repeat every 5 minutes for a total of 3 tablets. Yes     omeprazole (PRILOSEC) 20 mg capsuleLast dose:  --  Take 1 capsule (20 mg total) by mouth daily. Yes     polyethylene glycol (GLYCOLAX; MIRALAX) 17 gram/dose powderLast dose:  --  Take 17 g by mouth daily. Yes Yes   sodium bicarbonate 650 mg tabletLast dose:  --  Take 1 tablet (650 mg total) by mouth 3 (three) times daily. Yes     Prior to admission medications last reviewed by Aretha Parrot, PharmD on Tue May 09, 2023 2103 Thank Karie Schwalbe, PharmD3/11/20259:05 PMPhone: [facility]

## 2023-05-09 NOTE — Progress Notes
 [facility]-YscDaily Progress NoteHospital Medicine ServiceAttending Provider: Audrie Gallus, MD  Location:  [phone] Day #1Subjective   Patient seen and examined on 05/09/2023.CC: CystitisInterim History: Patient was in the bathroom at the time of the fall. Daughter was on the other side of the door.Daughter lives on other floor of the house. Noted bladder spasms despite foley catheter in place.Daughter noted increased sediment and Foley catheter normally drains clear yellow urine. She also notes he is more confused today and is usually very cognitively intact. Finger hurts but laceration clean.   Objective  Vitals:Temp:  [97.9 ?F (36.6 ?C)-98.8 ?F (37.1 ?C)] 98.2 ?F (36.8 ?C)Pulse:  [68-80] 74Resp:  [16-20] 20BP: (129-144)/(55-70) 144/64SpO2:  [97 %-100 %] 98 %Device (Oxygen Therapy): room airI/O's:Intake/Output Summary (Last 24 hours) at 05/09/2023 1931Last data filed at 05/09/2023 1643Gross per 24 hour Intake 50 ml Output 2500 ml Net -2450 ml  Physical Exam Constitutional: well nourished. Eyes: Anicteric, EOMI.HENT: No ear or nasal discharge.Cardiovascular: S1S2 RRR. Loud machinery like murmur auscultated and audible in the carotids bilaterally Resp: CTA B/L. GI: soft NT/ND. GU: Foley draining yellow urine with sediment, some discomfort on suprapubic palpationNeuro: AAO x 2 (person, place, year but not month (February). No motor deficits.Musculoskeletal:  Left 4th digit laceration with suturesDerm: No lesions or rashes. Psych: Normal affect and mood Labs:I have reviewed the patient's pertinent labs as resulted in the EMR.CBC Last 24hrs: WBC/Hgb/Hct/Plts:  8.1/8.3/26.30/212 (03/11 0534)Chem Last 24hrs: BUN/Cr/GLU/ALT/AST/AMYLASE/LIPASE:  43/3.01/117/--/--/--/-- (03/11 0534)Peripheral Access:Periph IV 03/14/23 1900 median cubital(antecubital fossa), right (Active)  Foley Catheter:UreTHral Catheter 02/21/23 (Active)   UreTHral Catheter 03/14/23 1400 100% silicone 16 other (see comments) (Active)   UreTHral Catheter 05/08/23 1200 100% silicone 16 Registered Nurse 10 (Active) Needs foley order placed.Imaging:No results found. I reviewed lab results and microbiology in formulating this patient's plan of care.   Assessment: 89 y.o. male who lives in the community with his daughter and is partially dependent on ADL and iADLs with no known cognitive impairment. He has a PMHx of HTN, HLD, CAD, HFpEF, bilateral hearing loss (wears hearing aids), carotid artery stenosis, Hx of spinal stenosis with low back pain, Hx of vertigo, and BPH with chronic indwelling Foley catheter. He presents after an unwitnessed fall in the bathroom.Urinalysis showing pyuria, with reported likely bladder spasms and delirium convincing for a UTI and started on empiric antibiotics. Foley was exchanged in the ED on 3/10. 3/11: ongoing fall work up and PT eval   Plan: # Unwitnessed fall # Elevated Troponin# Hx of orthostatic hypotensionPossibly mechanical but he does not remember the events completely, therefore unclear if this was syncopal. Savage head and neck without IV contrast unremarkable. EKG showing no ST or T wave changes from prior EKGs. - Given hypertension, will hold midodrine for now - Telemetry- ECHO - Orthostatic vital signs - Encourage PO fluid intake - PT eval# Laceration to left 4th digitLaceration sutured in the EDCT head, neck with no acute abnormality- Continue to monitor and local wound care- Acetaminophen PRN # UTIPrior UTI grew Enterococcus and also growing - Started on ampicillin in the ED - continue - Follow repeat urine culture # AKI on CKD# CKD stage 4# Microcytic anemiaBaseline Cr 2.80 -> increased to 3.68 on admission, now down to 3.01 todayIron levels in 01/2023 elevated ferritin - suspect anemia of CKDMonitor lytes Continue with sodium bicarbonate 650 mg t.i.d.- Encourage PO fluids intake# CAD- Continue aspirin and clopidogrel- Continue rosuvastatin# Hx of depression- Continue home escitalopram 10 mg once daily # Spinal stenosis with  neurogenic claudication- Continue home gabapentin 200 mg nightly  Comorbidities Comorbidities present on admission:Chronic Heart Failure with preserved ejection fraction, last EF: 60% 03/06/2023.     Secondary diagnoses occurring during hospitalization:Hypocalcemia Diet: Diet RegularVTE PPx:   Medication Reconciliation: Pending: Pharmacist Review Communication: Family: daughter at bedside, RN, and Case ManagementDischarge Readiness: Expected Discharge Date: 05/11/23 AM-PAC (RN/PT):   /   d  Expected discharge location: UndeterminedBarrier(s) to discharge: Ongoing fall eval Full CodeMedical decision making for this patient was moderate., I spent 35 minutes today on this encounter before, during and after the visit, reviewing labs and records, evaluating and examining the patient, entering orders and documenting the visit. Signed:Maurio Baize D Tambria Pfannenstiel, MD3/11/20257:31 PM

## 2023-05-09 NOTE — ED Notes
 12:25 PM provider here seeing patient

## 2023-05-10 ENCOUNTER — Inpatient Hospital Stay: Admit: 2023-05-10 | Payer: PRIVATE HEALTH INSURANCE

## 2023-05-10 LAB — CBC WITH AUTO DIFFERENTIAL
BKR WAM ABSOLUTE IMMATURE GRANULOCYTES.: 0.05 x 1000/ÂµL (ref 0.00–0.30)
BKR WAM ABSOLUTE LYMPHOCYTE COUNT.: 1.06 x 1000/ÂµL (ref 0.60–3.70)
BKR WAM ABSOLUTE NRBC (2 DEC): 0 x 1000/ÂµL (ref 0.00–1.00)
BKR WAM ANC (ABSOLUTE NEUTROPHIL COUNT): 4.71 x 1000/ÂµL (ref 2.00–7.60)
BKR WAM BASOPHIL ABSOLUTE COUNT.: 0.03 x 1000/ÂµL (ref 0.00–1.00)
BKR WAM BASOPHILS: 0.4 % (ref 0.0–1.4)
BKR WAM EOSINOPHIL ABSOLUTE COUNT.: 0.4 x 1000/ÂµL (ref 0.00–1.00)
BKR WAM EOSINOPHILS: 5.8 % — ABNORMAL HIGH (ref 0.0–5.0)
BKR WAM HEMATOCRIT (2 DEC): 29.9 % — ABNORMAL LOW (ref 38.50–50.00)
BKR WAM HEMOGLOBIN: 9.2 g/dL — ABNORMAL LOW (ref 13.2–17.1)
BKR WAM IMMATURE GRANULOCYTES: 0.7 % (ref 0.0–1.0)
BKR WAM LYMPHOCYTES: 15.3 % — ABNORMAL LOW (ref 17.0–50.0)
BKR WAM MCH (PG): 22.4 pg — ABNORMAL LOW (ref 27.0–33.0)
BKR WAM MCHC: 30.8 g/dL — ABNORMAL LOW (ref 31.0–36.0)
BKR WAM MCV: 72.7 fL — ABNORMAL LOW (ref 80.0–100.0)
BKR WAM MONOCYTE ABSOLUTE COUNT.: 0.69 x 1000/ÂµL (ref 0.00–1.00)
BKR WAM MONOCYTES: 9.9 % (ref 4.0–12.0)
BKR WAM MPV: 10.1 fL (ref 8.0–12.0)
BKR WAM NEUTROPHILS: 67.9 % (ref 39.0–72.0)
BKR WAM NUCLEATED RED BLOOD CELLS: 0 % (ref 0.0–1.0)
BKR WAM PLATELETS: 227 x1000/ÂµL (ref 150–420)
BKR WAM RDW-CV: 14.9 % (ref 11.0–15.0)
BKR WAM RED BLOOD CELL COUNT.: 4.11 M/ÂµL (ref 4.00–6.00)
BKR WAM WHITE BLOOD CELL COUNT: 6.9 x1000/ÂµL (ref 4.0–11.0)

## 2023-05-10 LAB — BASIC METABOLIC PANEL
BKR ANION GAP: 11 (ref 7–17)
BKR BLOOD UREA NITROGEN: 40 mg/dL — ABNORMAL HIGH (ref 8–23)
BKR BUN / CREAT RATIO: 12.4 (ref 8.0–23.0)
BKR CALCIUM: 7.7 mg/dL — ABNORMAL LOW (ref 8.8–10.2)
BKR CHLORIDE: 112 mmol/L — ABNORMAL HIGH (ref 98–107)
BKR CO2: 20 mmol/L (ref 20–30)
BKR CREATININE DELTA: 0.22
BKR CREATININE: 3.23 mg/dL — ABNORMAL HIGH (ref 0.40–1.30)
BKR EGFR, CREATININE (CKD-EPI 2021): 17 mL/min/{1.73_m2} — ABNORMAL LOW (ref >=60)
BKR GLUCOSE: 106 mg/dL — ABNORMAL HIGH (ref 70–100)
BKR POTASSIUM: 4.3 mmol/L (ref 3.3–5.3)
BKR SODIUM: 143 mmol/L (ref 136–144)

## 2023-05-10 MED ORDER — RISPERIDONE 0.5 MG DISINTEGRATING TABLET
0.5 mg | Freq: Two times a day (BID) | Status: DC | PRN
Start: 2023-05-10 — End: 2023-05-17
  Administered 2023-05-10: 21:00:00 0.5 mg

## 2023-05-10 MED ORDER — TRAZODONE (DESYREL) 25 MG HALFTAB
25 mg | Freq: Three times a day (TID) | ORAL | Status: DC | PRN
Start: 2023-05-10 — End: 2023-05-17
  Administered 2023-05-10: 17:00:00 25 mg via ORAL

## 2023-05-10 NOTE — Plan of Care
 Inpatient Physical Therapy Evaluation IP Adult PT Eval/Treat - 05/10/23 0950    Date of Visit / Treatment  Date of Visit / Treatment 05/10/23   Note Type Evaluation   Progress Report Due 05/24/23   Start Time 910   End Time 950   Total Treatment Time 40 mins    General Information  Pertinent History Of Current Problem per chart, 89 y.o. male who lives in the community with his daughter and is partially dependent on ADL and iADLs with no known cognitive impairment. He has a PMHx of HTN, HLD, CAD, HFpEF, bilateral hearing loss (wears hearing aids), carotid artery stenosis, Hx of spinal stenosis with low back pain, Hx of vertigo, and BPH with chronic indwelling Foley catheter. He presents after an unwitnessed fall in the bathroom. Urinalysis showing pyuria, with reported likely bladder spasms and delirium convincing for a UTI and started on empiric antibiotics. Foley was exchanged in the ED on 3/10.   Subjective agreeable to PT   General Observations pt recieved supine in bed, RA, NAD, agreeable, RN cleared session   Precautions/Limitations Fall Precautions;Bed alarm;Chair alarm    Weight Bearing Status  Weight Bearing Status WNL - Within normal limits    Prior Level of Functioning/Social History  Additional Comments pt reports he lives with his daughter in a 2 level condo, has a chair lift to 2nd floor, ambulates using a rollator, also owns a RW, SC, wheelchair, and shower chair.    Vital Signs and Orthostatic Vital Signs  Vital Signs Vital Signs Stable   Vital Signs Free text RA, NAD    Pain/Comfort  Pain Comment (Pre/Post Treatment Pain) no c/o pain   Pain Rating at Rest 0/10 - no pain   Pain Rating with Activity 0/10 - no pain    Patient Coping  Observed Emotional State accepting   Verbalized Emotional State acceptance    Cognition  Overall Cognitive Status Within Functional Limits   Orientation Level Oriented X4   Level of Consciousness alert   Following Commands Follows all commands and directions without difficulty   Personal Safety / Judgment Fall risk    Vision/ Hearing  Vision Assessment Results No vision deficits noted    Range of Motion  Range of Motion Examination bilateral lower extremity ROM was WFL;bilateral upper extremity ROM was [facility]    Manual Muscle Testing  Manual Muscle Testing Comments deconditioned    Musculoskeletal  LUE Muscle Strength Grading 3-->active movement against gravity   RUE Muscle Strength Grading 3-->active movement against gravity   LLE Muscle Strength Grading 3-->active movement against gravity   RLE Muscle Strength Grading 3-->active movement against gravity    Muscle Tone  Muscle Tone Testing Results No muscle tone deficits noted    Coordination  Coordination Comments GMC slowed    Sensory Assessment  Sensory Tests Results No sensory impairment noted    Skin Assessment  Skin Assessment See Nursing Documentation    Balance  Sitting Balance: Static  FAIR-      Contact Guard to maintain static position with no Assistive Device   Sitting Balance: Dynamic  POOR+   Moves through 1/2 range with minimal assist to right self   Standing Balance: Static POOR      Moderate assist to maintain static position with no Assistive Device   Standing Balance: Dynamic  POOR-    Unable to move from midline voluntarily, maximal assist to right self   Balance Assist Device Rolling walker    Bed Mobility  Supine-to-Sit Independence/Assistance Level Moderate assist;Assist of 1;Verbal cues   Supine-to-Sit Assist Device Head of bed elevated;Hand held assist   Sit-to-Supine Independence/Assistance Level Moderate assist;Assist of 1;Verbal cues   Sit-to-Supine Assist Device Hand held assist   Bed Mobility Comments slow and effortful, cues for sequencing, assist to bring trunk upright    Sit-Stand Transfer Training  Sit-to-Stand Transfer Independence/Assistance Level Moderate assist;Assist of 1;Verbal cues   Sit-to-Stand Transfer Assist Device Rolling walker   Stand-to-Sit Transfer Independence/Assistance Level Moderate assist;Assist of 1;Verbal cues   Stand-to-Sit Transfer Assist Device Rolling walker   Transfer Safety Analysis Concerns cues for hand placement   Sit-Stand Transfer Comments slow to rise, B feet blocked d/t sliding, assist for anterior weight shift   Stand-Sit Transfer Comments poor eccentric control    Gait Training  Independence/Assistance Level  Moderate assist;Assist of 1;Verbal cues   Assistive Device  Rolling walker   Gait Distance 5 feet   Gait Training Comments small shuffled steps, unsteady, B knees blocked, fatigues quickly    Handoff Documentation  Handoff Patient in bed;Bed alarm;Patient instructed to call nursing for mobility;Discussed with nursing    Activity Tolerance  Activity Tolerance Comments fair+    PT- AM-PAC - Basic Mobility Screen- How much help from another person do you currently need.....  Turning from your back to your side while in a a flat bed without using rails? 3 - A Little - Requires a little help (supervision, minimal assistance). Can use assistive devices.   Moving from lying on your back to sitting on the side of a flat bed without using bed rails? 2 - A Lot - Requires a lot of help (maximum to moderate assistance). Can use assistive devices.   Moving to and from a bed to a chair (including a wheelchair)? 2 - A Lot - Requires a lot of help (maximum to moderate assistance). Can use assistive devices.   Standing up from a chair using your arms(e.g., wheelchair or bedside chair)? 2 - A Lot - Requires a lot of help (maximum to moderate assistance). Can use assistive devices.   To walk in a hospital room? 2 - A Lot - Requires a lot of help (maximum to moderate assistance). Can use assistive devices.   Climbing 3-5 steps with a railing? 1 - Total - Requires total assistance or cannot do it at all.   AMPAC Mobility Score 12   TARGET Highest Level of Mobility Mobility Level 4, Transfer to chair   ACTUAL Highest Level of Mobility Mobility Level 5, Stand for 1 minute    Therapeutic Exercise  Therapeutic Exercise Comments reviewed BUE/BLE therex, encouraged to continue throughout the day as tolerated    Neuromuscular Re-education  Neuromuscular Re-education Comments seated/standing static and dynamic balance    Therapeutic Functional Activity  Therapeutic Functional Activity Comments bed mobility, transfers, functional mobility, pt education    Clinical Impression  Initial Assessment Pt presents to PT with impaired strength, balance, and activity tolerance. He required moderate x1 to stand and take a few steps at the bedside, unsteady gait observed, B knee buckling observed. He will continue to benefit from skilled PT while admitted, rec d/c with moderate complexity support and continued PT when medically cleared.   Criteria for Skilled Therapeutic Interventions Met yes   Rehab Potential good, to achieve stated therapy goals    Patient/Family Stated Goals  Patient/Family Stated Goal(s) get stronger    Frequency/Equipment Recommendations  PT Frequency 3x per week   Next Treatment Expected 05/12/23  PT/PTA completing this assessment AV   Equipment Needs During Admission/Treatment Rolling walker    PT Recommendations for Inpatient Admission  Activity/Level of Assist out of bed;transfers only;assist of 2;moderate assistance;with rolling walker   ADL Recommendations bedside commode   Positioning reposition frequently   Therapeutic Exercise encourage exercise program issued    Education - Learning Assessment   Education Topic Functional mobility techniques;Safety;Exercise program;Therapy role/rehab process;Discharge planning;Assistance/guarding   Learners Patient   Readiness Acceptance   Method Demonstration;Explanation   Response Verbalizes Understanding;Demonstrated Understanding    Planned Treatment / Interventions  Plan for Next Visit progress as tolerated   Education Treatment / Interventions Patient Education / Training   Planned Treatment/Interventions Comments role of PT, POC, goals, safe d/c    PT Discharge Summary  Physical Therapy Disposition Recommendation Moderate complexity support and therapy to progress functional mobility/ ADLs/ IADLs recommended for post- acute care.  See assessment for additional details.   Additional Therapy Recommendations Physical Therapy Services in Discharge Environment     Plan of Care Overview/ Patient StatusProblem: Physical Therapy GoalsGoal: Physical Therapy GoalsDescription: PT GOALS1. Patient will perform bed mobility independently2. Patient will perform transfers with least assistive device with modified independence3. Patient will ambulate a minimum of 150 feet with least assistive device with modified independence4. Patient will ascend and descend at least 5 steps with modified independenceOutcome: Initial problem identification Mark [name], DPT

## 2023-05-10 NOTE — ED Notes
 7:29 AM received report assumed care of pt

## 2023-05-10 NOTE — Plan of Care
 Plan of Care Overview/ Patient StatusProblem: Adult Inpatient Plan of CareGoal: Readiness for Transition of CareOutcome: Initial problem identificationNote: Chart and notes reviewed. A 89 y.o. male who lives with his daughter and is partially dependent on ADL and iADLs with no known cognitive impairment. He has a PMHx of HTN, HLD, CAD, HFpEF, bilateral hearing loss (wears hearing aids), carotid artery stenosis, Hx of spinal stenosis with low back pain, Hx of vertigo, and BPH with chronic indwelling Foley catheter. He presents after an unwitnessed fall in the bathroom. Pt states he lives with his daughter in 3 level condo. He ambulates with RW as needed. He states he has rollator, rolling walker, wheelchair, etc. He is active with [facility]. He is known to [facility] for STR in the past. PT evaluation completed and recommending Moderate complexity. CM discussed with pt about d/c plan. He would like to go home with service (known to [facility] in the past). He allows to speak with his daughter, [name]. [name] states she can't take care of him if he can't walk. Therefore he needs to go STR to get stronger. Her #1 choice is [facility]. Referral sent. CM dept will follow. I discussed with the patient/family that the Care Management Department will send out a minimum of 5 referrals to facilities, when appropriate, that can meet their needs.  Referrals will include a patient/family preference along with facilities within a geographical area from the patient's home. If the preferred facility does not have an appropriate bed that can meet their needs, referrals will continue to be sent to identify other facilities that have available beds to avoid delays in discharge. Once the provider determines the patient is medically ready for discharge, the patient will transition to the accepting facility for initiation of their skilled needs. The patient/family states understanding.PASRR approved (Assessment ID # [id number]). 1:06 [id number] offered the bed on 3/13 (Thursday). Pending medically stable. Pending insurance authorization. 1:40 [id number] pt and daughter. Agreed with POC. Case Management Screening  Flowsheet Row Most Recent Value Case Management Screening: Chart review completed. If YES to any question below then proceed to CM Eval/Plan  Is there a change in their cognitive function No Do you anticipate that the pt will have any discharge needs requiring CM intervention? Yes Has there been an unscheduled readmission within the last 30 days and/or four (4) encounters (encounters include: ED, OBS, Inpatient) within the last six (6) months? No Were there services prior to admission ( Examples: Acute [facility],  Assisted Living, HD, Homecare, Extended Care Facility, Methadone, SNF, Outpatient Infusion Center) No Negative/Positive Screen Positive Screening: Complete CM Evaluation and Plan Case manager will take lead to arrange post-acute care services and continue to work with the care team as the patient progresses towards discharge Yes Case Manager Attestation  I have reviewed the medical record and completed the above screen. CM staff will follow patient's progress and discuss the plan of care with the Treatment Team. Yes  Case Management Evaluation  Flowsheet Row Most Recent Value Case Management Evaluation and Plan  Arrived from prior to admission home/apartment/condo Do you have a caregiver, or do you anticipate the need for a caregiver given the change in your physicial function? Yes Permission to Speak to Caregiver? Yes Caregiver name: [name]: daughter Lives with Daughter, Adult Services Prior to Admission None Patient Requires Care Coordination Intervention Due To Change in physical function, Discharge planning needs/concerns Prior to Hospitalization: Assistance Needed/DME being used Ambulation Ambulation Assistance/DME: Rolling  walker, Rollator, Manual wheelchair, Straight cane Documented Insurance Accurate Yes Any financial concerns related to anticipated discharge needs No Patient's home address verified Yes Patient's PCP of record verified Yes Last Date Seen by PCP 0-3 months Living Environment  Lives with Daughter, Adult Current Living Arrangements home/apartment/condo Source of Clinical History  Patient's clinical history has been reviewed and source of Information is: [employer], Medical Record, Patient, Daughter Case Manager Attestation  I have reviewed the medical record and completed the above evaluation with the following recommendations. Yes Discharge Planning Coordination Recommendations  Discharge Planning Coordination Recommendations STR (short term rehab at a SNF rehab unit) Case Manager reviewed plan of care/ continuum of care need's with  Interdisciplinary Team, Family, Patient

## 2023-05-10 NOTE — Care Coordination-Inpatient
 PENDING INSURANCE AUTHORIZATION THIS PATIENT CANNOT BE DISCHARGED UNTIL DETERMINATION OBTAINEDSecured facility: [facility] has [facility] initiated with this patient's insurer. Clinical information submitted to this patient's insurer for review today. We now await the authorization that will allow this patient to transfer to the facility.   Insurance Contact: Home and CommunityPhone: [phone]: [phone] Case No.: [id number] [facility] NilanTransition CoordinatorCare Management DepartmentY[facility] [place], [place] Phone: [phone]

## 2023-05-10 NOTE — Progress Notes
 [facility]	 [facility]	Daily Progress NoteHospital Medicine ServiceAttending Provider: Lyndle Herrlich, MD [phone]:  EX11/EX11Hospital Day #2Subjective   Patient seen and examined on 05/10/2023, 1:13 PM CC: CystitisInterim History: Delon was seen in the hallway of the ED. He was resting comfortably in bed, he was difficult to understand givenhe was soft spoken, but did state he went through all of this already this morning and was difficult to engage in the interview.ROS:ROS reviewed and otherwise negative.  Objective  Current Meds:ampicillin (OMNIPEN) IV Push OR IVPB, 1 g, Q8Haspirin, 81 mg, DailyclopidogreL, 75 mg, Dailyescitalopram oxalate, 10 mg, Dailyfamotidine, 20 mg, Nightlygabapentin, 200 mg, Nightly[Held by provider] midodrine, 2.5 mg, TID WCpolyethylene glycol, 17 g, Dailyrosuvastatin, 40 mg, Dailysodium bicarbonate, 650 mg, TID Vitals:Temp:  [97.9 ?F (36.6 ?C)-99 ?F (37.2 ?C)] 98.7 ?F (37.1 ?C)Pulse:  [68-82] 80Resp:  [16-20] 16BP: (129-159)/(64-73) 152/73SpO2:  [96 %-99 %] 99 %Device (Oxygen Therapy): room air  Labs:I have reviewed the patient's pertinent labs as resulted in the EMR.I/O's:Intake/Output Summary (Last 24 hours) at 05/10/2023 1313Last data filed at 05/09/2023 2147Gross per 24 hour Intake 50 ml Output 2500 ml Net -2450 ml  Physical ExamConstitutional:     Appearance: Normal appearance. He is not ill-appearing. HENT:    Mouth/Throat:    Mouth: Mucous membranes are moist.    Pharynx: Oropharynx is clear. Cardiovascular:    Rate and Rhythm: Regular rhythm.    Heart sounds: Normal heart sounds. Pulmonary:    Breath sounds: Normal breath sounds. Abdominal:    General: There is no distension.    Tenderness: There is no abdominal tenderness. Genitourinary:   Comments: Foley draining  clear yellow urineMusculoskeletal:    Right lower leg: No edema.    Left lower leg: No edema. Skin:   General: Skin is warm and dry.  Peripheral Access:Periph IV 03/14/23 1900 median cubital(antecubital fossa), right (Active)  Foley Catheter:UreTHral Catheter 02/21/23 (Active)   UreTHral Catheter 03/14/23 1400 100% silicone 16 other (see comments) (Active)   UreTHral Catheter 05/08/23 1200 100% silicone 16 Registered Nurse 10 (Active) Needs foley order placed.Imaging:Echocardiogram Complete (TTE)Result Date: 05/10/2023 * Normal left ventricular size, systolic function and wall motion. Mild concentric left ventricular hypertrophy.  LVEF calculated by biplane Simpson's was 65%.  Diastolic function was difficult to determine due to mitral valve abnormalities. * Normal right ventricular cavity size and systolic function.  Estimated right ventricular systolic pressure is 24 mmHg. * Atria are normal in size.  No interatrial shunt by color Doppler. * Low flow low gradient mild aortic stenosis with preserved ejection fraction.  Peak aortic velocity is 2.5 m/s.  Mean gradient is 13 mmHg.  Aortic valve area is 1.6 cm2.  Dimensionless index is 0.51.  LVOT SV Index is 28 ml/m2.  Trace aortic regurgitation. * Calcified mitral valve.  Moderate mitral annular calcification.  Mild mitral stenosis.  Mean mitral valve gradient is 5 mmHg at a heart rate of 81 bpm.  Trace mitral regurgitation. * All visible segments of the aorta are normal in size. * IVC diameter < 2.1 cm that collapses > 50% with a sniff suggests normal RAP (0-5 mmHg, mean 3 mmHg). * No significant pericardial effusion. * Compared with the prior study, dated 03/06/2023, there are no significant image changes.  I reviewed lab results, microbiology, and imaging in formulating this patient's plan of care.   Assessment: 89 y.o. male PMHx HTN,CAD, HFpEF, carotid artery stenosis , BPH wih chronic indwelling foley presented with an unwitnessed fall. He is also being treated  for possible UTI.Plan: Unwitnessed fall- awaiting OT eval- orthostatics negative2. Laceration to 4th digit- s/p sutures in ED- sutures need to be removed around 3/223. UTI- cont ampicillin although urine likley colonized4. CKD- baseline Cr fluctuates and appears to be at baseline  Comorbidities Comorbidities present on admission:Chronic Heart Failure with preserved ejection fraction, last EF: 60% 05/10/2023. CKD Stage 3b (GFR 30-44)Chronic Anemia, Anemia of Chronic Disease  Secondary diagnoses occurring during hospitalization:Hypocalcemia  #. Diet: Diet Regular#. PPx:#. Medication Reconciliation: reviewed, completed#. Dispo: has STR bed tomorrow, awaiting  insurance auth, if Berkley Harvey obtained can likely go tomorrow Communication: RN, CCMedical decision making for this patient was moderate.I spent 35 minutes today on this encounter before, during and after the visit, reviewing labs and records, evaluating and examining the patient, entering orders and documenting the visit. Full Code Signed:Terresa Marlett PA-C3/12/20251:13 PM

## 2023-05-10 NOTE — ED Notes
 7:23 AM Nursing report compelted to [employer], care deferred.

## 2023-05-11 LAB — PHOSPHORUS     (BH GH L LMW YH): BKR PHOSPHORUS: 3.6 mg/dL (ref 2.2–4.5)

## 2023-05-11 LAB — MAGNESIUM: BKR MAGNESIUM: 2 mg/dL (ref 1.7–2.4)

## 2023-05-11 NOTE — Care Coordination-Inpatient
 INSURANCE AUTH OBTAINED FOR STR 05/10/23 1337 Authorization Information Date Authorization Initiated:  05/10/23 Time Authorization Initiated: 1337 Mode Clinical was sent: Payor Comm Facility Name [facility] Authorization yes(Alastair/Jamari notified) Insurance Company [facility] Medicare Insurance Co. Solicitor Name/# [employer] Authorization #/Details [id number], 3/13-3/17, 5 days approved, NRD 3/17 Date Authorization recieved 05/11/23 Time Authorization recieved 0815 Follow up contact necessary Yes Contact Name [facility] Contact phone: [phone] Insurance Co. Fax number: [phone]

## 2023-05-11 NOTE — Other
 [facility]	 [facility]	Medicine PA Progress NoteAttending Provider: Donald Siva Berkley Harvey, MDPCP: Renaldo Reel, JohnHospital Day: 3 Subjective: Interim History: Yesterday he was combative with staff and climbing OOB leading to restraints, risperidone x1 and trazodone x1. Today he's more lethargic requiring sternal rub to wake up. Able to answer questions when awake (oriented x4). Wants to be left alone to sleep. Objective: Vitals: Temp:  [97.7 ?F (36.5 ?C)-99.6 ?F (37.6 ?C)] 97.7 ?F (36.5 ?C)Pulse:  [69-84] 69Resp:  [17-21] 19BP: (132-171)/(60-80) 171/80SpO2:  [94 %-97 %] 97 %Device (Oxygen Therapy): room airDevice (Oxygen Therapy): room air Gross Totals (Last 24 hours) at 05/11/2023 1353Last data filed at 05/11/2023 0938Intake -- Output 2000 ml Net -2000 ml Exam:Physical ExamVitals and nursing note reviewed. HENT:    Mouth/Throat:    Mouth: Mucous membranes are dry. Pulmonary:    Effort: Pulmonary effort is normal. Abdominal:    Palpations: Abdomen is soft. Skin:   General: Skin is warm and dry. Neurological:    Mental Status: He is lethargic.    Comments: Requiring sternal rub to open eyes. Oriented x4 (person, hospital, year, and situation) Psychiatric:       Mood and Affect: Mood normal.       Behavior: Behavior normal. Labs:Recent Labs   03/11/250534 03/12/250626 NA 143 143 K 3.8 4.3 CL 112* 112* CO2 20 20 BUN 43* 40* CREATININE 3.01* 3.23* CALCIUM 7.3* 7.7* MG  --  2.0 PHOS  --  3.6 Recent Labs   03/11/250534 03/12/250626 WBC 8.1 6.9 HGB 8.3* 9.2* HCT 26.30* 29.90* PLT 212 227 Micro:_ Urine culture  Collection Time: 05/08/23 10:50 AM  Specimen: Urine Result Value  Urine Culture, Routine Mixed Urine Culture Diagnostics:CXR (portable)Result Date: 05/08/2023 No acute cardiothoracic abnormality. [facility] Notify System Classification: Routine. Report initiated by:  Herbert Deaner, MD Reported and signed by: Jonnie Finner, MD  [facility] XR Chest PA or APResult Date: 03/14/2023 Multiple pulmonary edema and bibasilar atelectatic changes. [facility] Notify System Classification: Routine. Reported and signed by: Dulce Sellar, MD  [facility]  ECG/Tele Events: Assessment: Mark [name] is a 89 y.o. male with a PMH of HFpEF (EF 65%), CKD, CAD, HTN, hard of hearing and HLD who presented to the ED after unwitnessed fall in bathroom and received sutures for left hand lac with unremarkable trauma w/u now admitted for UTI and STR placement. Today he is notably lethargic today requiring sternal rubs to wake up likely 2/2 sedating meds yesterday but oriented x4.Plan: #Unwitnessed fall with DDx of syncope vs deconditioning/generalized weakness vs mechanical- [place] head cervical spine: negative- XR Pelvis: negative- XR left hand: negative- CXR: negative- Left hand laceration repaired with sutures in ED (remove stitches around 3/22)- Orthostatics negative- Work with PT- Ambulate with unit staff when more alert to prevent deconditioning#Acute metabolic encephalopathy likely multifactorial iso UTI vs trazodone administration vs delirium 2/2 hospitalization- Combative and agitation yesterday, received trazodone and risperidone - DC trazodone d/t significant sedation- DC'd restraints at 1400 3/13- Monitor off restraints- Notify provider if patient is becoming more agitated- PRN risperidone 0.5mg  for agitation- Collateral from daughter, [name], states pt's baseline is alert and oriented#Elevated trops likely iso demand ischemia with Hx HFpEF (EF 60%), CAD, and HLD#HfpEF (EF 60%)EKG: No STEMI, SR, 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 Findings are suggestive of chronic urinary retention, and superimposed infection cannot be excluded- UA Cx mixed organisms consistent w/ contamination by urogenital flora and/or improper collection/storage	- UA 09/14/22 (+) Enterococcus faecalis and Aerococcus urinae- On ampicillin q8hr for 5 days (3/10 - present) - Foley exchanged 3/10#Hx Orthostatic hypotension- Holding Midodrine d/t HTN- BP 171/80 today#CKD4 (baseline around 2.4-2.8)- cont sodium bicarb 650 mg TID#Depression- cont lexapro 10 mg#Spinal stenosis- cont gabapentin 200mg  nightlyDiet: regularVTE ppx: SCDsDispo: STR pending off restraints x24 hrs (DC'd on 3/13 @ 1400)Signed:Daisean Brodhead Mod[place] Charon, PA-S3/13/20251:53 PM

## 2023-05-11 NOTE — Progress Notes
 [facility]	 [facility]	Medicine Progress NoteHospitalist ServiceAttending Provider: Thurnell Lose, MD [phone]:  6717/6717-AHospital Day #3 Subjective: CC/Interim History/ROS: Patient found sleeping in bed in NAD. Pt states easily wakes up and states he is tired but quickly falls back asleep. He denies all acute complaints. No overnight events. Objective: Vitals:Last 24 hours: Temp:  [97.7 ?F (36.5 ?C)-99.6 ?F (37.6 ?C)] 97.7 ?F (36.5 ?C)Pulse:  [69-84] 69Resp:  [17-21] 19BP: (132-171)/(60-80) 171/80SpO2:  [94 %-97 %] 97 %Physical Exam Physical ExamConstitutional:     Comments: Easily awakes, but quickly falls back asleep HENT:    Head: Normocephalic and atraumatic. Eyes:    Conjunctiva/sclera: Conjunctivae normal. Cardiovascular:    Rate and Rhythm: Normal rate and regular rhythm. Pulmonary:    Effort: Pulmonary effort is normal.    Breath sounds: Normal breath sounds. Abdominal:    General: Bowel sounds are normal. There is no distension.    Palpations: Abdomen is soft.    Tenderness: There is no abdominal tenderness. Musculoskeletal:       General: No swelling or tenderness. Skin:   General: Skin is warm and dry. Neurological:    Mental Status: He is alert.    Comments: Dementia  Assessment: 89 y.o. male w/ a pmhx of  HTN,CAD, HFpEF, carotid artery stenosis, spinal stenosis, BPH wih chronic indwelling foley presented with an unwitnessed fall. Trauma work-up unremarkable aside from laceration to L Hand requiring sutures. Found to have UTI on Unasyn. Ccb agitation. HD stable - attempting to wean restraints. Plan: #Unwitnessed Fall - Unclear if mechanical or syncopal - [place] Head/neck unremarkable - XR Pelvis unremarkable- Orthostatics negative - Trop significantly elevated, but ECG and echo unremarkable - likely demand #Acute Toxic Metabolic Encephalopathy - Likely I/s/o delirium and UTI - More lethargic today - likely I/s/o agitation overnight - received trazodone and risperidone - CTH as above unremarkable - Delirium precautions #Hx of Hypotension --> now w/ Hypertension - BPs 120-130s/70-80s- Holding midodrine #Laceration to 4th digit - Sutured in ED --> remove stitches around 3/22- XR hand noted Metallic densities along the tip of the fourth finger, presumably foreign bodies #UTI w/ Chronic Foley, BPH - UA c/f UTI- Cx Mixed- [place] AP w/ overall similar severity of bilateral mild to moderate hydronephrosis.  Bilateral ureters appear dilated and tortuous, and there is urothelial thickening and enhancement in the right ureter. - Cont Unasyn (day 1/5) given hx of Enterococcus #CV: CAD, HFpEF- Cont ASA- Cont plavix - Cont statin #CKD4- Cr 3.23, difficult to interpret basleine, but suspect he is typically a bit better than this - Cont sodium bicarb - CO2 20#Mild Hypocalcemia - Ca2+ 7.7 --> corrected 8.0 - Check Vit D, Mg2+, PTH, ionized calcium #Depression - Cont escitalopram #Spinal Stenosis - Cont gabpentin 200 mg nightly #F/E/N: Diet Regular - Cont pepcid nightly  - Cont miralax  Comorbidities Comorbidities present on admission:Chronic Heart Failure with preserved ejection fraction, last EF: 65% 05/10/2023. 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)Chronic Anemia  Secondary diagnoses occurring during hospitalization:Hypocalcemia VTE PPx:  SCDMobility: OOB w/ assist Dispo: Has STR bed - awaiting weaning off restraints x24 hoursFull CodeI spent 35 minutes today on this encounter before, during and after the visit, reviewing labs and records, evaluating and examining the patient, entering orders and documenting the visit. Signed:Suzzette Gasparro, PA-CAfter 1600, please contact covering provider or Hospitalist in Dynamic Role10:54 AM

## 2023-05-11 NOTE — Plan of Care
 Plan of Care Overview/ Patient StatusProblem: Adult Inpatient Plan of CareGoal: Readiness for Transition of CareOutcome: Interventions implemented as appropriate Auth received for STR. Per nursing notes, patient in bilateral soft writs restraint and lap belt.Auth information sen to [place] by [employer]. Asked how long the patient will need to be out of restraints before they will accept.0930: Per Latanya Maudlin, the patient will need to be off restraints for 24 hours. Covering provider aware. Care management continue to follow.Trish Mage, RN, [facility] Manager [facility]

## 2023-05-11 NOTE — Plan of Care
 Plan of Care Overview/ Patient StatusPatient sleeping throughout the morning. Daughter visited and at bedside. UTA due to lethargy. Provider aware. Refused ambulation, compression boots applied. Restraints discontinued and IV wrapped. Frequent rounding maintained. Patient resting. Problem: Adult Inpatient Plan of CareGoal: Plan of Care ReviewOutcome: Interventions implemented as appropriateGoal: Patient-Specific Goal (Individualized)Outcome: Interventions implemented as appropriateGoal: Absence of Hospital-Acquired Illness or InjuryOutcome: Interventions implemented as appropriateGoal: Optimal Comfort and WellbeingOutcome: Interventions implemented as appropriateGoal: Readiness for Transition of CareOutcome: Interventions implemented as appropriate Problem: Physical Therapy GoalsGoal: Physical Therapy GoalsDescription: PT GOALS1. Patient will perform bed mobility independently2. Patient will perform transfers with least assistive device with modified independence3. Patient will ambulate a minimum of 150 feet with least assistive device with modified independence4. Patient will ascend and descend at least 5 steps with modified independenceOutcome: Interventions implemented as appropriate Problem: Restraint, NonviolentGoal: Absence of Harm or InjuryOutcome: Interventions implemented as appropriate Problem: WoundGoal: Optimal CopingOutcome: Interventions implemented as appropriateGoal: Optimal Functional AbilityOutcome: Interventions implemented as appropriateGoal: Absence of Infection Signs and SymptomsOutcome: Interventions implemented as appropriateGoal: Improved Oral IntakeOutcome: Interventions implemented as appropriateGoal: Optimal Pain Control and FunctionOutcome: Interventions implemented as appropriateGoal: Skin Health and IntegrityOutcome: Interventions implemented as appropriateGoal: Optimal Wound HealingOutcome: Interventions implemented as appropriate Problem: Violence Risk or ActualGoal: Anger and Impulse ControlOutcome: Interventions implemented as appropriate Problem: Fall Injury RiskGoal: Absence of Fall and Fall-Related InjuryOutcome: Interventions implemented as appropriate Problem: Skin Injury Risk IncreasedGoal: Skin Health and IntegrityOutcome: Interventions implemented as appropriate

## 2023-05-11 NOTE — Plan of Care
 Admission Note Nursing Mark [name] is a 89 y.o. male admitted with a chief complaint of UTI, unwitnessed  fall. Patient arrived from ED Patient is  Orientation: disoriented to;place;time;situation Vitals:  05/10/23 2330 05/11/23 0000 05/11/23 0130 05/11/23 0321 BP:     Pulse:     Resp: (!) 21  18 19  Temp:     TempSrc:     SpO2:     Weight:  84 kg   Height:  5' 11 (1.803 m)   Oxygen therapy Oxygen TherapySpO2: 94 %Device (Oxygen Therapy): room airOxygen Concentration (%): 60O2 Flow (L/min): 30ROX Index (Auto Calc): 6.41Belongings charted in last 7 days:  See flowsheets, patient education and plan of care for additional information. Plan of Care Overview/ Patient StatusPatient A/O x 1, on room air, Patient was combative kicking staff, trying to climb and get out of bed, bilateral soft restraints plus soft belt applied.

## 2023-05-12 LAB — COMPREHENSIVE METABOLIC PANEL
BKR A/G RATIO: 1.2 (ref 1.0–2.2)
BKR ALANINE AMINOTRANSFERASE (ALT): 21 U/L (ref 9–59)
BKR ALBUMIN: 3.4 g/dL — ABNORMAL LOW (ref 3.6–5.1)
BKR ALKALINE PHOSPHATASE: 86 U/L (ref 9–122)
BKR ANION GAP: 10 (ref 7–17)
BKR ASPARTATE AMINOTRANSFERASE (AST): 25 U/L (ref 10–35)
BKR AST/ALT RATIO: 1.2
BKR BILIRUBIN TOTAL: 0.3 mg/dL (ref <=1.2)
BKR BLOOD UREA NITROGEN: 39 mg/dL — ABNORMAL HIGH (ref 8–23)
BKR BUN / CREAT RATIO: 10.7 (ref 8.0–23.0)
BKR CALCIUM: 8.6 mg/dL — ABNORMAL LOW (ref 8.8–10.2)
BKR CHLORIDE: 109 mmol/L — ABNORMAL HIGH (ref 98–107)
BKR CO2: 23 mmol/L (ref 20–30)
BKR CREATININE DELTA: 0.43
BKR CREATININE: 3.66 mg/dL — ABNORMAL HIGH (ref 0.40–1.30)
BKR EGFR, CREATININE (CKD-EPI 2021): 15 mL/min/{1.73_m2} — ABNORMAL LOW (ref >=60)
BKR GLOBULIN: 2.9 g/dL (ref 2.0–3.9)
BKR GLUCOSE: 114 mg/dL — ABNORMAL HIGH (ref 70–100)
BKR POTASSIUM: 5.3 mmol/L (ref 3.3–5.3)
BKR PROTEIN TOTAL: 6.3 g/dL (ref 5.9–8.3)
BKR SODIUM: 142 mmol/L (ref 136–144)

## 2023-05-12 LAB — URINALYSIS-MACROSCOPIC W/REFLEX MICROSCOPIC
BKR BILIRUBIN, UA: NEGATIVE
BKR GLUCOSE, UA: NEGATIVE
BKR KETONES, UA: NEGATIVE
BKR NITRITE, UA: NEGATIVE
BKR PH, UA: 7.5 (ref 5.5–7.5)
BKR SPECIFIC GRAVITY, UA: 1.012 (ref 1.005–1.030)
BKR UROBILINOGEN, UA (MG/DL): <2 mg/dL (ref <=2.0)

## 2023-05-12 LAB — URINE MICROSCOPIC     (BH GH LMW YH)
BKR RBC/HPF INSTRUMENT: 75 /HPF — ABNORMAL HIGH (ref 0–2)
BKR TRANS EPITHELIAL CELLS, UA (NUMERIC): 13 /HPF — ABNORMAL HIGH
BKR WBC/HPF INSTRUMENT: 308 /HPF — ABNORMAL HIGH (ref 0–5)

## 2023-05-12 LAB — CALCIUM, IONIZED, WHOLE BLOOD (BH GH LMW YH): BKR CALCIUM, IONIZED, WHOLE BLOOD: 4.71 mg/dL (ref 4.60–5.08)

## 2023-05-12 LAB — SODIUM, URINE, RANDOM W/O CREATININE: BKR SODIUM, URINE RANDOM: 100 mmol/L

## 2023-05-12 LAB — PTH, INTACT WITHOUT CALCIUM: BKR PARATHYROID HORMONE INTACT: 81.5 pg/mL — ABNORMAL HIGH (ref 15.0–65.0)

## 2023-05-12 LAB — CREATININE, URINE, RANDOM: BKR CREATININE, URINE, RANDOM: 49 mg/dL

## 2023-05-12 MED ORDER — SODIUM CHLORIDE 0.9 % INTRAVENOUS SOLUTION
INTRAVENOUS | Status: AC
Start: 2023-05-12 — End: ?
  Administered 2023-05-12: 09:00:00 via INTRAVENOUS

## 2023-05-12 MED ORDER — HEPARIN (PORCINE) 5,000 UNIT/ML INJECTION SOLUTION
5000 | Freq: Three times a day (TID) | SUBCUTANEOUS | Status: DC
Start: 2023-05-12 — End: 2023-05-17
  Administered 2023-05-12 – 2023-05-17 (×14): 5000 mL via SUBCUTANEOUS

## 2023-05-12 NOTE — Plan of Care
 Plan of Care Overview/ Patient Status1900-0700H Patient A/O x 2, on room air, VSS, slept well during the shift, no signs of any distress noted.

## 2023-05-12 NOTE — Progress Notes
 [facility]	 [facility]	Medicine Progress NoteHospitalist ServiceAttending Provider: Thurnell Lose, MD [phone]:  6717/6717-AHospital Day #4 Subjective: CC/Interim History/ROS: Patient found sitting in bed w/ RN and student at bedside in NAD. States he is feelign fine today - feels surprised about his kidney function as he usually drinks 1 L of water a day. Discussed his lethargy yesteday/reduced PO and contrast during this admission. Aware of plan for [facility] pending improvement in kidney function. No overnight events. Objective: Vitals:Last 24 hours: Temp:  [97.5 ?F (36.4 ?C)-97.7 ?F (36.5 ?C)] 97.5 ?F (36.4 ?C)Pulse:  [69-78] 74Resp:  [18-21] 18BP: (130-171)/(73-82) 164/77SpO2:  [97 %-100 %] 99 %Physical Exam Physical ExamHENT:    Head: Normocephalic and atraumatic. Eyes:    Conjunctiva/sclera: Conjunctivae normal. Cardiovascular:    Rate and Rhythm: Normal rate and regular rhythm.    Heart sounds: Murmur heard. Pulmonary:    Effort: Pulmonary effort is normal.    Breath sounds: Normal breath sounds. Abdominal:    General: Bowel sounds are normal. There is no distension.    Palpations: Abdomen is soft.    Tenderness: There is no abdominal tenderness. Musculoskeletal:       General: No swelling or tenderness. Skin:   General: Skin is warm and dry. Neurological:    Mental Status: He is alert. Assessment: 89 y.o. male w/ a pmhx of  HTN,CAD, HFpEF, carotid artery stenosis, spinal stenosis, BPH wih chronic indwelling foley presented with an unwitnessed fall. Trauma work-up unremarkable aside from laceration to L Hand requiring sutures. Found to have UTI on Unasyn. Ccb agitation likely 2/2 hospital delirium briefly requiring restraint. Now w/ AKI on CKD. Plan: #Unwitnessed Fall - Unclear if mechanical or syncopal - [place] Head/neck unremarkable - XR Pelvis unremarkable- Orthostatics negative - Trop significantly elevated, but ECG and echo unremarkable - likely demand #Acute Toxic Metabolic Encephalopathy - Resolved- Likely I/s/o delirium and UTI - CTH as above unremarkable - Avoid physical and chemical restraint- Delirium precautions #Hx of Hypotension --> now w/ Hypertension - BPs 1140-160s/70s- Holding midodrine #Laceration to 4th digit - Sutured in ED --> remove stitches around 3/22- XR hand noted Metallic densities along the tip of the fourth finger, presumably foreign bodies #UTI w/ Chronic Foley, BPH - UA c/f UTI- Cx Mixed- [place] AP w/ overall similar severity of bilateral mild to moderate hydronephrosis.  Bilateral ureters appear dilated and tortuous, and there is urothelial thickening and enhancement in the right ureter. - Cont Unasyn (day 4/5) given hx of Enterococcus #CV: CAD, HFpEF- Cont ASA- Cont plavix - Cont statin #AKI on CKD4- Cr rising - now 3.66 - suspect pre-renal w/ lethargy yesterday, possible CIN - Difficult to interpret basleine, but suspect he is typically a bit better than this - Monitor K+ - 5.3 today. If >5.5 would order lokelma- Cont sodium bicarb - CO2 23- Will give NS today - Send urine lytes and UA- Consider Renal and Urology consult tomorrow if no improvement given on-going hydronephrosis#Mild Hypocalcemia - Ca2+ improved to 8.6 today. Ionized wnl - PTH elevated to 81- Awaiting Vit D- Cont to monitor #Depression - Cont escitalopram #Spinal Stenosis - Cont gabpentin 200 mg nightly #F/E/N: Diet Regular - Cont pepcid nightly  - Cont miralax  Comorbidities Comorbidities present on admission:Chronic Heart Failure with preserved ejection fraction, last EF: 65% 05/10/2023. 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)Chronic AnemiaSecondary diagnoses occurring during hospitalization:Hypocalcemia VTE PPx:  SCDMobility: OOB w/ assist Dispo: Has STR bed - awaiting weaning off restraints x24 hoursFull CodeI spent 35 minutes today on  this encounter before, during and after the visit, reviewing labs and records, evaluating and examining the patient, entering orders and documenting the visit. Signed:Davan Nawabi, PA-CAfter 1600, please contact covering provider or Hospitalist in Dynamic Role11:05 AM

## 2023-05-12 NOTE — Other
 [facility]	 [facility]	Medicine PA Progress NoteAttending Provider: Donald Siva Berkley Harvey, MDPCP: Renaldo Reel, JohnHospital Day: 4 Subjective: Interim History: - Lethargic yesterday, requiring sternal rub to wake up, oriented x4 when awake- NAEON- Today he's more awake. A/Ox4. Daughter by bedside, updated on POCObjective: Vitals: Temp:  [97.5 ?F (36.4 ?C)-97.8 ?F (36.6 ?C)] 97.8 ?F (36.6 ?C)Pulse:  [69-78] 69Resp:  [18-21] 18BP: (130-168)/(73-82) 147/75SpO2:  [97 %-100 %] 97 %Device (Oxygen Therapy): room airDevice (Oxygen Therapy): room air Gross Totals (Last 24 hours) at 05/12/2023 1221Last data filed at 05/12/2023 1100Intake 60 ml Output 1300 ml Net -1240 ml Exam:Physical ExamVitals and nursing note reviewed. HENT:    Mouth/Throat:    Mouth: Mucous membranes are dry. Cardiovascular:    Rate and Rhythm: Normal rate.    Pulses: Normal pulses. Pulmonary:    Effort: Pulmonary effort is normal. Abdominal:    Palpations: Abdomen is soft. Musculoskeletal:    Left hand: Laceration present.    Comments: With sutures Skin:   General: Skin is warm and dry. Neurological:    Mental Status: He is alert and oriented to person, place, and time. Mental status is at baseline.    Comments: Oriented x4 (person, hospital, year, and situation) Psychiatric:       Mood and Affect: Mood normal.       Behavior: Behavior normal. Labs:Recent Labs   03/12/250626 03/14/250445 NA 143 142 K 4.3 5.3 CL 112* 109* CO2 20 23 BUN 40* 39* CREATININE 3.23* 3.66* CALCIUM 7.7* 8.6* MG 2.0  --  PHOS 3.6  --  Recent Labs   03/12/250626 WBC 6.9 HGB 9.2* HCT 29.90* PLT 227 Micro:_ Urine culture  Collection Time: 05/08/23 10:50 AM  Specimen: Urine Result Value  Urine Culture, Routine Mixed Urine Culture Diagnostics:CXR (portable)Result Date: 05/08/2023 No acute cardiothoracic abnormality. [place] [facility] Notify System Classification: Routine. Report initiated by:  Herbert Deaner, MD Reported and signed by: Jonnie Finner, MD  [facility] Radiology and Biomedical Imaging XR Chest PA or APResult Date: 03/14/2023 Multiple pulmonary edema and bibasilar atelectatic changes. [place] [facility] Notify System Classification: Routine. Reported and signed by: Dulce Sellar, MD  [facility] Radiology and Biomedical Imaging  ECG/Tele Events: Assessment: Mark [name] is a 89 y.o. male with a PMH of HFpEF (EF 65%), CKD, CAD, HTN, hard of hearing and HLD who presented to the ED after unwitnessed fall in bathroom and received sutures for left hand lac with unremarkable trauma w/u now admitted for UTI and STR placement. Yesterday he was notably lethargic likely due to sedating medication. He's stable, more alert, and off wrist restrains now pending resolution of kidney function before going to [place].Plan: #Unwitnessed fall with DDx of syncope vs deconditioning/generalized weakness vs mechanical- [place] head cervical spine: negative- XR Pelvis: negative- XR left hand: negative- CXR: negative- Left hand laceration repaired with sutures in ED (remove stitches around 3/22)- Orthostatics negative- Work with PT- Ambulate with unit staff today to prevent deconditioning#Acute metabolic encephalopathy likely multifactorial iso UTI vs trazodone administration vs delirium 2/2 hospitalization- Combative and agitated 3/12 requiring trazodone and risperidone resulting in lethargy- DC trazodone d/t significant sedation- DC'd restraints at 1400 3/13- Monitor off restraints- Notify provider if patient is becoming more agitated- PRN risperidone 0.5mg  for agitation- Collateral from daughter, Mark [name], states pt's baseline is alert and oriented- Today more alert and back to baseline#Elevated trops likely iso demand ischemia with Hx HFpEF (EF 60%), CAD, and HLD#HfpEF (EF 60%)EKG: No STEMI, SR, 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 Findings are suggestive of chronic urinary retention, and superimposed infection cannot be excluded- UA Cx mixed organisms consistent w/ contamination by urogenital flora and/or improper collection/storage	- UA 09/14/22 (+) Enterococcus faecalis and Aerococcus urinae- On ampicillin q8hr for 5 days (3/10-3/15) - Foley exchanged 3/10#Hx Orthostatic hypotension- Holding Midodrine d/t HTN- BP 140/60s today#AKI on CKD4 (baseline around 2.4-2.8) likely prerenal 2/2 poor PO intake iso AMS vs contrast-induced nephritis vs AIN- Cr 3.23 > 3.66- K 4.3 > 5.3- cont sodium bicarb 650 mg TID- Started NS @ 75cc/hr (until 2100) today#Depression- cont lexapro 10 mg#Spinal stenosis- cont gabapentin 200mg  nightlyDiet: RegularVTE ppx: SCDsDispo: STR pending off restraints x24 hrs (DC'd on 3/13 @ 1400)Signed:Ercilia Bettinger Mod[place] Charon, PA-S3/14/2025 12:30 PM

## 2023-05-12 NOTE — Plan of Care
 Problem: Adult Inpatient Plan of CareGoal: Plan of Care ReviewOutcome: Interventions implemented as appropriateGoal: Patient-Specific Goal (Individualized)Outcome: Interventions implemented as appropriate Problem: Violence Risk or ActualGoal: Anger and Impulse ControlOutcome: Interventions implemented as appropriate Problem: Fall Injury RiskGoal: Absence of Fall and Fall-Related InjuryOutcome: Interventions implemented as appropriate Plan of Care Overview/ Patient Status{Plan of care in progressNo complaints of pain this shiftFoley to gravity draining clear yellow urine Vss BP (!) 147/75  - Pulse 69  - Temp 97.8 ?F (36.6 ?C) (Oral)  - Resp 18  - Ht 5' 11 (1.803 m)  - Wt 84 kg  - SpO2 97%  - BMI 25.83 kg/m? Remains on IV abx. Call bell in reach, bed locked and in low position, safety maintained.

## 2023-05-13 LAB — BASIC METABOLIC PANEL
BKR ANION GAP: 10 (ref 7–17)
BKR BLOOD UREA NITROGEN: 40 mg/dL — ABNORMAL HIGH (ref 8–23)
BKR BUN / CREAT RATIO: 11.3 (ref 8.0–23.0)
BKR CALCIUM: 8.3 mg/dL — ABNORMAL LOW (ref 8.8–10.2)
BKR CHLORIDE: 108 mmol/L — ABNORMAL HIGH (ref 98–107)
BKR CO2: 22 mmol/L (ref 20–30)
BKR CREATININE DELTA: -0.12
BKR CREATININE: 3.54 mg/dL — ABNORMAL HIGH (ref 0.40–1.30)
BKR EGFR, CREATININE (CKD-EPI 2021): 15 mL/min/{1.73_m2} — ABNORMAL LOW (ref >=60)
BKR GLUCOSE: 97 mg/dL (ref 70–100)
BKR POTASSIUM: 5 mmol/L (ref 3.3–5.3)
BKR SODIUM: 140 mmol/L (ref 136–144)

## 2023-05-13 LAB — MAGNESIUM: BKR MAGNESIUM: 2.1 mg/dL (ref 1.7–2.4)

## 2023-05-13 MED ORDER — BISACODYL 10 MG RECTAL SUPPOSITORY
10 | Freq: Once | RECTAL | Status: CP
Start: 2023-05-13 — End: ?
  Administered 2023-05-13: 16:00:00 10 mg via RECTAL

## 2023-05-13 MED ORDER — SENNOSIDES 8.6 MG TABLET
8.6 | Freq: Two times a day (BID) | ORAL | Status: DC
Start: 2023-05-13 — End: 2023-05-17
  Administered 2023-05-14 – 2023-05-17 (×6): 8.6 mg via ORAL

## 2023-05-13 MED ORDER — SODIUM CHLORIDE 0.9 % INTRAVENOUS SOLUTION
INTRAVENOUS | Status: AC
Start: 2023-05-13 — End: ?
  Administered 2023-05-13 (×2): via INTRAVENOUS

## 2023-05-13 MED ORDER — CAMPHOR-MENTHOL 0.5 %-0.5 % LOTION
0.5-0.5 | Freq: Two times a day (BID) | TOPICAL | Status: DC | PRN
Start: 2023-05-13 — End: 2023-05-17
  Administered 2023-05-14: 06:00:00 0.5-0.5 mL via TOPICAL

## 2023-05-13 MED ORDER — POLYETHYLENE GLYCOL 3350 17 GRAM ORAL POWDER PACKET
17 | Freq: Two times a day (BID) | ORAL | Status: DC
Start: 2023-05-13 — End: 2023-05-17
  Administered 2023-05-13 – 2023-05-17 (×7): 17 gram via ORAL

## 2023-05-13 NOTE — Plan of Care
 Plan of Care Overview/ Patient StatusA&Ox4. RA. Denies CP, SOB, N/V/D. IV patent and flushes well. Medicated per MAR. Takes pills whole. Independent in bed, T&R encouraged. Chronic foley in place-- CYU. Safety maintained, frequent rounding. Bed in low/bed alarm on. Call bell within reach. See flowsheets for more info. Problem: Adult Inpatient Plan of CareGoal: Plan of Care ReviewOutcome: Interventions implemented as appropriateGoal: Patient-Specific Goal (Individualized)Outcome: Interventions implemented as appropriateGoal: Absence of Hospital-Acquired Illness or InjuryOutcome: Interventions implemented as appropriateGoal: Optimal Comfort and WellbeingOutcome: Interventions implemented as appropriateGoal: Readiness for Transition of CareOutcome: Interventions implemented as appropriate Problem: Physical Therapy GoalsGoal: Physical Therapy GoalsDescription: PT GOALS1. Patient will perform bed mobility independently2. Patient will perform transfers with least assistive device with modified independence3. Patient will ambulate a minimum of 150 feet with least assistive device with modified independence4. Patient will ascend and descend at least 5 steps with modified independenceOutcome: Interventions implemented as appropriate Problem: Restraint, NonviolentGoal: Absence of Harm or InjuryOutcome: Interventions implemented as appropriate Problem: WoundGoal: Optimal CopingOutcome: Interventions implemented as appropriateGoal: Optimal Functional AbilityOutcome: Interventions implemented as appropriateGoal: Absence of Infection Signs and SymptomsOutcome: Interventions implemented as appropriateGoal: Improved Oral IntakeOutcome: Interventions implemented as appropriateGoal: Optimal Pain Control and FunctionOutcome: Interventions implemented as appropriateGoal: Skin Health and IntegrityOutcome: Interventions implemented as appropriateGoal: Optimal Wound HealingOutcome: Interventions implemented as appropriate Problem: Violence Risk or ActualGoal: Anger and Impulse ControlOutcome: Interventions implemented as appropriate Problem: Fall Injury RiskGoal: Absence of Fall and Fall-Related InjuryOutcome: Interventions implemented as appropriate Problem: Skin Injury Risk IncreasedGoal: Skin Health and IntegrityOutcome: Interventions implemented as appropriate

## 2023-05-13 NOTE — Progress Notes
 [facility]	 [facility]	Medicine Progress NoteAttending Provider: Thurnell Lose, MDPCP: Hipolito Bayley [phone] Day: 5 Patient examined by me: 3/15/2025Subjective: CC: CystitisInterim History: Patient seen and examined at bedside. Patient reported feeling comfortable, eating breakfast. Offers no complaints. Asking when he'll be transferred to STR.Overnight Events: NoneReview of Systems Respiratory:  Negative for shortness of breath.  Cardiovascular:  Negative for chest pain and palpitations. Gastrointestinal:  Negative for abdominal pain, nausea and vomiting. Review of Allergies/Meds/Hx: I have reviewed the patient's: allergies, current scheduled medications, current infusions, current prn medications, past medical history, past surgical history, family history, social history and prior to admission medications.Scheduled Meds:Current Facility-Administered Medications Medication Dose Route Frequency Provider Last Rate Last Admin  aspirin EC delayed release tablet 81 mg  81 mg Oral Daily Fay Records, MD   81 mg at 05/13/23 0818  clopidogreL (PLAVIX) tablet 75 mg  75 mg Oral Daily Fay Records, MD   75 mg at 05/13/23 0818  escitalopram oxalate (LEXAPRO) tablet 10 mg  10 mg Oral Daily Fay Records, MD   10 mg at 05/13/23 0818  famotidine (PEPCID) tablet 20 mg  20 mg Oral Nightly Tamsen Snider, PA   20 mg at 05/12/23 2032  gabapentin (NEURONTIN) capsule 200 mg  200 mg Oral Nightly Fay Records, MD   200 mg at 05/12/23 2032  heparin (PORCINE) injection 5,000 Units  5,000 Units Subcutaneous Q8H Evette Cristal, PA   5,000 Units at 05/13/23 0522  [Held by provider] midodrine (PROAMATINE) tablet 2.5 mg  2.5 mg Oral TID WC Fay Records, MD      polyethylene glycol [facility]) packet 17 g  17 g Oral Daily Fay Records, MD   17 g at 05/13/23 0818  rosuvastatin (CRESTOR) tablet 40 mg 40 mg Oral Daily Fay Records, MD   40 mg at 05/13/23 0818  sodium bicarbonate tablet 650 mg  650 mg Oral TID Fay Records, MD   650 mg at 05/13/23 0818 Continuous Infusions: sodium chloride 75 mL/hr (05/13/23 0818) PRN Meds:.acetaminophen, risperiDONE, [Held by provider] traZODoneCurrent Facility-Administered Medications Medication Dose Route Frequency Last Rate  acetaminophen  650 mg Oral Q6H PRN    aspirin  81 mg Oral Daily    clopidogreL  75 mg Oral Daily    escitalopram oxalate  10 mg Oral Daily    famotidine  20 mg Oral Nightly    gabapentin  200 mg Oral Nightly    heparin (PORCINE)  5,000 Units Subcutaneous Q8H    [Held by provider] midodrine  2.5 mg Oral TID WC    polyethylene glycol  17 g Oral Daily    risperiDONE  0.5 mg Translingual BID PRN    rosuvastatin  40 mg Oral Daily    sodium bicarbonate  650 mg Oral TID    sodium chloride  75 mL/hr Intravenous Continuous 75 mL/hr (05/13/23 0818)  [Held by provider] traZODone  25 mg Oral TID PRN   Objective: Vitals: Patient Vitals for the past 24 hrs: Temp Temp src Pulse BP NIBP MAP (Monitor/Manual Entry) NIBP MAP (mmHg) (calculated/READ ONLY) Resp SpO2 05/13/23 0552 99.3 ?F (37.4 ?C) Oral 75 (!) 145/73 97 97 20 99 % 05/12/23 2258 97.4 ?F (36.3 ?C) Oral 80 (!) 157/74 102 102 20 99 % 05/12/23 2030 97.7 ?F (36.5 ?C) Oral 80 (!) 147/73 97 97 18 98 % 05/12/23 1603 97.6 ?F (36.4 ?C) Oral 69 (!) 147/74 98 98 20 98 % Physical Exam:  Constitutional: oriented to person, place, and no acute distress. HEENT: Head: NCAT. Eyes: EOMI. PERRL. Neck: Neck supple. Cardiovascular: RRRS1S2.  2/6 SEMPulmonary/Chest: BS +. no wheezes. no rales.Abdominal: Soft. BS +. Non-tender, Non-distended.Musculoskeletal: Normal ROM. Neurological: alert and oriented to person, place. Non focal neurological examExtremities: No pedal edema.Skin: No rashesLabs:Recent Labs   03/14/250445 03/15/250556 NA 142 140 K 5.3 5.0 CL 109* 108* CO2 23 22 BUN 39* 40* CREATININE 3.66* 3.54* No results for input(s): WBC, HGB, HCT, PLT in the last 72 hours.Recent Labs   03/14/250445 ALT 21 AST 25 BILITOT 0.3 Recent Labs Lab 03/10/251053 PTT 26.8 INR 1.06 No results for input(s): CKTOTAL, TROPONINI, TROPONINT, CKMBINDEX in the last 168 hours.Invalid input(s): PROBNPNo results for input(s): TROPONINT, TROPONINI, CKMB in the last 168 hours. Micro: Procedures:None IV Access: PIVDiagnostics:No new radiology.ECG/Tele Events: I have reviewed the patient's ECG as resulted in the EMR.Assessment: Assessment: 89 y.o. male with PMH of HTN,CAD, HFpEF, carotid artery stenosis, spinal stenosis, BPH wih chronic indwelling foley presented with an unwitnessed fall. Trauma work-up unremarkable aside from laceration to L Hand requiring sutures. Found to have UTI on Unasyn. Ccb agitation likely 2/2 hospital delirium briefly requiring restraint. Now w/ AKI on CKD. Plan: Unwitnessed Fall: - Unclear if mechanical or syncopal - [place] Head/neck unremarkable - XR Pelvis unremarkable- Orthostatics negative - Trop significantly elevated, but ECG and TTE unremarkable - likely demand ischemia iso CKD4 Acute Toxic Metabolic Encephalopathy:- Resolved- Likely i/s/o delirium and UTI - CTH as above unremarkable - Avoid physical and chemical restraint- Delirium precautions   Hx Hypotension / Hx HTN:- BPs improved- Holding midodrine --Hx dysautonomia Laceration to Left Hand 4th digit:- Sutured in ED --> remove stitches around 3/22- XR hand noted Metallic densities along the tip of the fourth finger-tip, presumably chronic  foreign bodies -no intervention recommended UTI w/ Chronic Foley, BPH:- UA c/f UTI- Cx Mixed- [place] AP w/ overall similar severity of bilateral mild to moderate hydronephrosis.  Bilateral ureters appear dilated and tortuous, and there is urothelial thickening and enhancement in the right ureter. - Completed Unasyn (day 5/5) given hx of Enterococcus  Hx CAD, HFpEF:- Cont ASA- Cont plavix - Cont statin  AKI on CKD4:- Max 3.66--decreasing - suspect pre-renal w/ lethargy yesterday, possible CIN - Difficult to interpret basleine, but suspect he is typically a bit better than this - Monitor K+ - 5.0 today. If >5.5 would order lokelma- Cont sodium bicarb - CO2 23- c/w NS today - Send urine lytes and UA- Consider Renal and Urology consult tomorrow if no improvement given on-going hydronephrosis Mild Hypocalcemia:- Ca2+ improved. Ionized Ca++ wnl - PTH elevated to 81- Awaiting Vit D- Cont to monitor  Depression: - Cont escitalopram  Spinal Stenosis:- Cont gabpentin 200 mg nightly Comorbidities Comorbidities present on admission:Chronic Heart Failure with preserved ejection fraction, last EF: 65% 05/10/2023. 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)Chronic AnemiaSecondary diagnoses occurring during hospitalization:Hypocalcemia FEN: diet, K>4, Mg>2 Lines: PIV Tubes: NoneMobility: pendingPT/OT: pendingDisposition: pendingMedication Reconciliation: In ProgressCommunication: pendingSigned:Shaquavia Whisonant Vivi Martens, MD, MSc  Communication via [facility] Hospitalist Service3/15/2025, 11:57 AMMDM: Time: >35  minutes : time spent obtaining history from patient or surrogate, review of medical records/prior chart documentation , examining/evaluating patient, ordering or performing treatments/interventions; ordering/reviewing laboratory studies, radiographic studies, pulse oximetry; re-evaluation of patient's condition and development of treatment plan, discussions with consultants, evaluation of patient's response to treatment. With 50% of time spent in care follow-up/care coordination, plan of care discussion with consulting specialities, and with the patient and/or patient's family/HCPOA/Surrogate.Code Status Full Code DVT Prophylaxis  Heparin Sc ADMIT MED REC In Progress PCP Hipolito Bayley [phone] Emergency contact Extended Emergency Contact InformationPrimary Emergency Contact: Mcquade,SusanHome Phone: [phone] Disposition TBD

## 2023-05-13 NOTE — Plan of Care
 Problem: Adult Inpatient Plan of CareGoal: Plan of Care ReviewOutcome: Interventions implemented as appropriate Plan of Care Overview/ Patient Status{Plan of care in progressNo complaints of pain More alert and awake. No restraints x>48hrsIv fluids infusing. VssBP (!) 145/73  - Pulse 75  - Temp 99.3 ?F (37.4 ?C) (Oral)  - Resp 20  - Ht 5' 11 (1.803 m)  - Wt 84 kg  - SpO2 99%  - BMI 25.83 kg/m? Chronic foley (changed 3/10) to gravity draining clear yellow urineConstipation (suppository given)Pending placement Call bell in reach, bed locked and in low position Safety maintained.

## 2023-05-14 LAB — BASIC METABOLIC PANEL
BKR ANION GAP: 11 (ref 7–17)
BKR BLOOD UREA NITROGEN: 38 mg/dL — ABNORMAL HIGH (ref 8–23)
BKR BUN / CREAT RATIO: 10.8 (ref 8.0–23.0)
BKR CALCIUM: 8 mg/dL — ABNORMAL LOW (ref 8.8–10.2)
BKR CHLORIDE: 110 mmol/L — ABNORMAL HIGH (ref 98–107)
BKR CO2: 19 mmol/L — ABNORMAL LOW (ref 20–30)
BKR CREATININE DELTA: -0.01
BKR CREATININE: 3.53 mg/dL — ABNORMAL HIGH (ref 0.40–1.30)
BKR EGFR, CREATININE (CKD-EPI 2021): 15 mL/min/{1.73_m2} — ABNORMAL LOW (ref >=60)
BKR GLUCOSE: 104 mg/dL — ABNORMAL HIGH (ref 70–100)
BKR POTASSIUM: 4.5 mmol/L (ref 3.3–5.3)
BKR SODIUM: 140 mmol/L (ref 136–144)

## 2023-05-14 LAB — PHOSPHORUS     (BH GH L LMW YH): BKR PHOSPHORUS: 3.7 mg/dL (ref 2.2–4.5)

## 2023-05-14 LAB — CBC WITHOUT DIFFERENTIAL
BKR WAM ANC (ABSOLUTE NEUTROPHIL COUNT): 5.83 x 1000/ÂµL (ref 2.00–7.60)
BKR WAM HEMATOCRIT (2 DEC): 28.7 % — ABNORMAL LOW (ref 38.50–50.00)
BKR WAM HEMOGLOBIN: 9 g/dL — ABNORMAL LOW (ref 13.2–17.1)
BKR WAM MCH (PG): 22.9 pg — ABNORMAL LOW (ref 27.0–33.0)
BKR WAM MCHC: 31.4 g/dL (ref 31.0–36.0)
BKR WAM MCV: 73 fL — ABNORMAL LOW (ref 80.0–100.0)
BKR WAM MPV: 9.6 fL (ref 8.0–12.0)
BKR WAM PLATELETS: 281 x1000/ÂµL (ref 150–420)
BKR WAM RDW-CV: 14.8 % (ref 11.0–15.0)
BKR WAM RED BLOOD CELL COUNT.: 3.93 M/ÂµL — ABNORMAL LOW (ref 4.00–6.00)
BKR WAM WHITE BLOOD CELL COUNT: 8.6 x1000/ÂµL (ref 4.0–11.0)

## 2023-05-14 LAB — MAGNESIUM: BKR MAGNESIUM: 1.9 mg/dL (ref 1.7–2.4)

## 2023-05-14 MED ORDER — DEXTROMETHORPHAN-GUAIFENESIN 10 MG-100 MG/5 ML ORAL SYRUP
10-1005 | ORAL | Status: DC | PRN
Start: 2023-05-14 — End: 2023-05-17
  Administered 2023-05-15: 01:00:00 10-1005 mL via ORAL

## 2023-05-14 MED ORDER — SODIUM CHLORIDE 0.9 % INTRAVENOUS SOLUTION
INTRAVENOUS | Status: DC
Start: 2023-05-14 — End: 2023-05-15
  Administered 2023-05-14: 14:00:00 via INTRAVENOUS

## 2023-05-14 NOTE — Other
 I spoke with patient's daughter, [name], by phone to provide a clinical update.Signed:Analyn Matusek D Verdis Prime, PA6:00 PM3/16/2025

## 2023-05-14 NOTE — Other
 Mark [name]  REQUEST  DOCUMENTATION-[facility] NOTE-Type of consult: [facility]  -New Consult: [id number] Mark [name] / Location: [id number]/6717-A / Brief Clinical Question: Unresolving AKI despite fluid challenge in setting of hydronephrosis and contrast/Callback Cell Phone: [phone]/ Please confirm receipt of this message by texting back ?OK?-1 - Mobile Heartbeat message sent to Dillard [place] at 9:31 AM. Received response at [phone].-REMINDER: CONSULT CONNECT IS CLOSED 1900 TO [phone]. PRIMARY TEAM MAY CONTACT YOU DIRECTLY ABOUT CONSULTS. Caryn Bee Dick3/16/20259:30 [employer] [phone]

## 2023-05-14 NOTE — Progress Notes
 [facility]	 [facility]	Medicine Progress NoteAttending Provider: Thurnell Lose, MDPCP: Hipolito Bayley [phone] Day: 6 Patient examined by me: 3/16/2025Subjective: CC: CystitisInterim History: Patient seen and examined at bedside. Patient reported feeling comfortable, eating all his breakfast. Offers no complaints. Asking STR.Overnight Events: NoneReview of Systems Respiratory:  Negative for shortness of breath.  Cardiovascular:  Negative for chest pain and palpitations. Gastrointestinal:  Negative for abdominal pain, nausea and vomiting. Review of Allergies/Meds/Hx: I have reviewed the patient's: allergies, current scheduled medications, current infusions, current prn medications, past medical history, past surgical history, family history, social history and prior to admission medications.Scheduled Meds:Current Facility-Administered Medications Medication Dose Route Frequency Provider Last Rate Last Admin  aspirin EC delayed release tablet 81 mg  81 mg Oral Daily Fay Records, MD   81 mg at 05/14/23 7829  clopidogreL (PLAVIX) tablet 75 mg  75 mg Oral Daily Fay Records, MD   75 mg at 05/14/23 5621  escitalopram oxalate (LEXAPRO) tablet 10 mg  10 mg Oral Daily Fay Records, MD   10 mg at 05/14/23 3086  famotidine (PEPCID) tablet 20 mg  20 mg Oral Nightly Tamsen Snider, PA   20 mg at 05/13/23 2122  gabapentin (NEURONTIN) capsule 200 mg  200 mg Oral Nightly Fay Records, MD   200 mg at 05/13/23 2122  heparin (PORCINE) injection 5,000 Units  5,000 Units Subcutaneous Q8H Evette Cristal, [place]   5,000 Units at 05/14/23 5784  [Held by provider] midodrine (PROAMATINE) tablet 2.5 mg  2.5 mg Oral TID WC Fay Records, MD      polyethylene glycol [facility]) packet 17 g  17 g Oral BID Lenord Fellers, [place]   17 g at 05/14/23 0831  rosuvastatin (CRESTOR) tablet 40 mg  40 mg Oral Daily Fay Records, MD   40 mg at 05/14/23 6962  senna (SENOKOT) tablet 17.2 mg  2 tablet Oral BID Lenord Fellers, PA   17.2 mg at 05/14/23 9528  sodium bicarbonate tablet 650 mg  650 mg Oral TID Fay Records, MD   650 mg at 05/14/23 4132 Continuous Infusions:PRN Meds:.acetaminophen, camphor-menthoL, dextromethorphan-guaiFENesin, risperiDONE, [Held by provider] traZODoneCurrent Facility-Administered Medications Medication Dose Route Frequency Last Rate  acetaminophen  650 mg Oral Q6H PRN    aspirin  81 mg Oral Daily    camphor-menthoL   Topical (Top) BID PRN    clopidogreL  75 mg Oral Daily    dextromethorphan-guaiFENesin  5 mL Oral Q4H PRN    escitalopram oxalate  10 mg Oral Daily    famotidine  20 mg Oral Nightly    gabapentin  200 mg Oral Nightly    heparin (PORCINE)  5,000 Units Subcutaneous Q8H    [Held by provider] midodrine  2.5 mg Oral TID WC    polyethylene glycol  17 g Oral BID    risperiDONE  0.5 mg Translingual BID PRN    rosuvastatin  40 mg Oral Daily    senna  2 tablet Oral BID    sodium bicarbonate  650 mg Oral TID    [Held by provider] traZODone  25 mg Oral TID PRN   Objective: Vitals: Patient Vitals for the past 24 hrs: Temp Temp src Pulse BP NIBP MAP (Monitor/Manual Entry) NIBP MAP (mmHg) (calculated/READ ONLY) Resp SpO2 05/14/23 0735 98 ?F (36.7 ?C) Oral 76 (!) 167/71 103 103 19 98 % 05/14/23 0554 98.2 ?F (36.8 ?C) -- 78 (!) 164/73 -- 103 16 96 % 05/13/23  2342 98.2 ?F (36.8 ?C) Oral 79 (!) 164/76 105 105 20 98 % 05/13/23 2108 98.1 ?F (36.7 ?C) Oral 78 (!) 153/75 101 101 17 99 % 05/13/23 1544 97.6 ?F (36.4 ?C) Oral 70 133/69 90 90 18 96 % Physical Exam:  Constitutional: oriented to person, place, and no acute distress. HEENT: Head: NCAT. Eyes: EOMI. PERRL. Neck: Neck supple. Cardiovascular: RRRS1S2.  2/6 SEMPulmonary/Chest: BS +. no wheezes. no rales.Abdominal: Soft. BS +. Non-tender, Non-distended.Musculoskeletal: Normal ROM. Neurological: alert and oriented to person, place. Non focal neurological examExtremities: No pedal edema.Skin: No rashesLabs:Recent Labs   03/14/250445 03/15/250556 03/16/250632 NA 142 140 140 K 5.3 5.0 4.5 CL 109* 108* 110* CO2 23 22 19* BUN 39* 40* 38* CREATININE 3.66* 3.54* 3.53* Recent Labs   03/16/250632 WBC 8.6 HGB 9.0* HCT 28.70* PLT 281 Recent Labs   03/14/250445 ALT 21 AST 25 BILITOT 0.3 Recent Labs Lab 03/10/251053 PTT 26.8 INR 1.06 No results for input(s): CKTOTAL, TROPONINI, TROPONINT, CKMBINDEX in the last 168 hours.Invalid input(s): PROBNPNo results for input(s): TROPONINT, TROPONINI, CKMB in the last 168 hours. Micro: Procedures:None IV Access: PIVDiagnostics:No new radiology.ECG/Tele Events: I have reviewed the patient's ECG as resulted in the EMR.Assessment: Assessment: 89 y.o. male with PMH of HTN,CAD, HFpEF, carotid artery stenosis, spinal stenosis, BPH wih chronic indwelling foley presented with an unwitnessed fall. Trauma work-up unremarkable aside from laceration to L Hand requiring sutures. Found to have UTI on Unasyn. Ccb agitation likely 2/2 hospital delirium briefly requiring restraint. Now w/ AKI on CKD. Plan: Unwitnessed Fall: - Unclear if mechanical or syncopal - [place] Head/neck unremarkable - XR Pelvis unremarkable- Orthostatics negative - Trop significantly elevated, but ECG and TTE unremarkable - likely demand ischemia iso CKD4 Acute Toxic Metabolic Encephalopathy:- Resolved- Likely i/s/o delirium and UTI - CTH as above unremarkable - Avoid physical and chemical restraint- Delirium precautions   Hx Hypotension / Hx HTN:- BPs improved- Holding midodrine --Hx dysautonomia? Laceration to Left Hand 4th digit:- Sutured in ED --> remove stitches around 3/22- XR hand noted Metallic densities along the tip of the fourth finger-tip, presumably chronic  foreign bodies -no intervention recommended UTI w/ Chronic Foley, BPH:- UA c/f UTI- Cx Mixed- Ruby AP w/ overall similar severity of bilateral mild to moderate hydronephrosis.  Bilateral ureters appear dilated and tortuous, and there is urothelial thickening and enhancement in the right ureter. - Completed Unasyn (day 5/5) given hx of Enterococcus  Hx CAD, HFpEF:- Cont ASA- Cont plavix - Cont statin  AKI on CKD4:- Max 3.66--decreasing very slowly- suspect pre-renal w/ lethargy yesterday, possible CIN - Difficult to interpret basleine, but suspect he is typically a bit better than this - Monitor K+ - 5.0 today. If >5.5 would order lokelma- Cont sodium bicarb - CO2 23- c/w NS today - Send urine lytes and UA- consult Renal , consider checking renal US if AKI persistent-consider Urology consult tomorrow if no improvement given on-going hydronephrosis Mild Hypocalcemia:- Ca2+ improved. Ionized Ca++ wnl - PTH elevated to 81- Awaiting Vit D- Cont to monitor  Depression: - Cont escitalopram  Spinal Stenosis:- Cont gabpentin 200 mg nightly Comorbidities Comorbidities present on admission:Chronic Heart Failure with preserved ejection fraction, last EF: 65% 05/10/2023. 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)Chronic AnemiaSecondary diagnoses occurring during hospitalization:Hypocalcemia FEN: diet, K>4, Mg>2 Lines: PIV Tubes: NoneMobility: pendingPT/OT: pendingDisposition: pendingMedication Reconciliation: In ProgressCommunication: pendingSigned:Machele Deihl Vivi Martens, MD, MSc  Communication via [facility] Hospitalist Service3/16/2025, 11:21 AMMDM: Time: >35  minutes : time spent obtaining history from patient or surrogate, review of medical records/prior  chart documentation , examining/evaluating patient, ordering or performing treatments/interventions; ordering/reviewing laboratory studies, radiographic studies, pulse oximetry; re-evaluation of patient's condition and development of treatment plan, discussions with consultants, evaluation of patient's response to treatment. With 50% of time spent in care follow-up/care coordination, plan of care discussion with consulting specialities, and with the patient and/or patient's family/HCPOA/Surrogate.Code Status Full Code DVT Prophylaxis Heparin Sc ADMIT MED REC In Progress PCP Hipolito Bayley [phone] Emergency contact Extended Emergency Contact InformationPrimary Emergency Contact: Betker,SusanHome Phone: [phone] Disposition TBD

## 2023-05-14 NOTE — Plan of Care
 Problem: Adult Inpatient Plan of CareGoal: Plan of Care ReviewOutcome: Interventions implemented as appropriate Problem: Fall Injury RiskGoal: Absence of Fall and Fall-Related InjuryOutcome: Interventions implemented as appropriate Problem: Skin Injury Risk IncreasedGoal: Skin Health and IntegrityOutcome: Interventions implemented as appropriate Plan of Care Overview/ Patient Status{Plan of care in progress No complaints of pain Foley to gravity draining clear yellow urine. Vss BP (!) 167/71  - Pulse 76  - Temp 98 ?F (36.7 ?C) (Oral)  - Resp 19  - Ht 5' 11 (1.803 m)  - Wt 84 kg  - SpO2 98%  - BMI 25.83 kg/m? Ambulated x1 asst with walker BM yesterday Patient more confused in evening. Notified providerPatient states that he is going to run for president because one of the politicians stole his wife from himCall bell in reach, bed locked and in low position Safety  maintained

## 2023-05-14 NOTE — Plan of Care
 Plan of Care Overview/ Patient StatusA&Ox3, disoriented to time. Reorientation provided. RA. Denies CP, SOB, N/V/D. Elevated BP, provider notified, no new orders. Medicated per MAR. IV patent and flushes well. Pills whole. Encouraged turning and repositioning self. Chronic foley in place-- CYU. Good output. Safety maintained, frequent rounding. Bed in low/bed alarm on. Call bell within reach. See flowsheets for more info. Problem: Adult Inpatient Plan of CareGoal: Plan of Care ReviewOutcome: Interventions implemented as appropriateGoal: Patient-Specific Goal (Individualized)Outcome: Interventions implemented as appropriateGoal: Absence of Hospital-Acquired Illness or InjuryOutcome: Interventions implemented as appropriateGoal: Optimal Comfort and WellbeingOutcome: Interventions implemented as appropriateGoal: Readiness for Transition of CareOutcome: Interventions implemented as appropriate Problem: Physical Therapy GoalsGoal: Physical Therapy GoalsDescription: PT GOALS1. Patient will perform bed mobility independently2. Patient will perform transfers with least assistive device with modified independence3. Patient will ambulate a minimum of 150 feet with least assistive device with modified independence4. Patient will ascend and descend at least 5 steps with modified independenceOutcome: Interventions implemented as appropriate Problem: Restraint, NonviolentGoal: Absence of Harm or InjuryOutcome: Interventions implemented as appropriate Problem: WoundGoal: Optimal CopingOutcome: Interventions implemented as appropriateGoal: Optimal Functional AbilityOutcome: Interventions implemented as appropriateGoal: Absence of Infection Signs and SymptomsOutcome: Interventions implemented as appropriateGoal: Improved Oral IntakeOutcome: Interventions implemented as appropriateGoal: Optimal Pain Control and FunctionOutcome: Interventions implemented as appropriateGoal: Skin Health and IntegrityOutcome: Interventions implemented as appropriateGoal: Optimal Wound HealingOutcome: Interventions implemented as appropriate Problem: Violence Risk or ActualGoal: Anger and Impulse ControlOutcome: Interventions implemented as appropriate Problem: Fall Injury RiskGoal: Absence of Fall and Fall-Related InjuryOutcome: Interventions implemented as appropriate Problem: Skin Injury Risk IncreasedGoal: Skin Health and IntegrityOutcome: Interventions implemented as appropriate

## 2023-05-14 NOTE — Consults
 [facility] HealthNephrology Consult NoteReason for Consult: AKI on CKD Consultation Requested XB:JYNWGN, Berkley Harvey, MDHPI:89 y.o. male with PMH of HTN,CAD, HFpEF, carotid artery stenosis, spinal stenosis, BPH wih chronic indwelling foley and CKD who presented with an unwitnessed fall, found to have UTI on Unasyn. Hospital course complicated by worsening of the Cr for which Nephrology is consulted. Patient has CKD 4 with baseline Cr around 2.5, Cr at admission was 3.68 slightly improved then worsen again and now with 3.5.Patient received IV contrast on 3/10.Vital sign notable for the high BP.Robust UOP with 3L in the last 24 hours  Lab is sig for CO2 of 19/BUN 38/Cr of 3.5UA with sign WBC/RBC/LE/2+ P/1+ B/Moderate bacteria. CTAP W/C on 05/08/23:  Hydronephrosis, urinary bladder wall thickening, and ureteral urothelial thickening. Findings are suggestive of chronic urinary retention, and superimposed infection cannot be excluded. PMHx: Past Medical History: Diagnosis Date  (HFpEF) heart failure with preserved ejection fraction (HC Code) (HC CODE) (HC Code)   BPH (benign prostatic hyperplasia)   Chronic coronary artery disease   CKD (chronic kidney disease) stage 4, GFR 15-29 ml/min (HC Code) (HC CODE) (HC Code)   GERD (gastroesophageal reflux disease)   Hyperlipidemia   Hypertension   Microcytic anemia   Spinal stenosis  SocHx: Binyamin reports that he has quit smoking. He has never been exposed to tobacco smoke. He has never used smokeless tobacco. He reports that he does not currently use alcohol. He reports that he does not use drugs.FamHx: family history includes Heart disease in his mother.Allergies:Allergies Allergen Reactions  Ace Inhibitors Cough Medications:Scheduled Meds:Current Facility-Administered Medications Medication Dose Route Frequency Provider Last Rate Last Admin  aspirin EC delayed release tablet 81 mg  81 mg Oral Daily Fay Records, MD   81 mg at 05/14/23 5621  clopidogreL (PLAVIX) tablet 75 mg  75 mg Oral Daily Fay Records, MD   75 mg at 05/14/23 3086  escitalopram oxalate (LEXAPRO) tablet 10 mg  10 mg Oral Daily Fay Records, MD   10 mg at 05/14/23 5784  famotidine (PEPCID) tablet 20 mg  20 mg Oral Nightly Tamsen Snider, PA   20 mg at 05/13/23 2122  gabapentin (NEURONTIN) capsule 200 mg  200 mg Oral Nightly Fay Records, MD   200 mg at 05/13/23 2122  heparin (PORCINE) injection 5,000 Units  5,000 Units Subcutaneous Q8H Evette Cristal, [place]   5,000 Units at 05/14/23 6962  [Held by provider] midodrine (PROAMATINE) tablet 2.5 mg  2.5 mg Oral TID WC Fay Records, MD      polyethylene glycol (MIRALAX) packet 17 g  17 g Oral BID Eustace Moore D, [place]   17 g at 05/14/23 0831  rosuvastatin (CRESTOR) tablet 40 mg  40 mg Oral Daily Fay Records, MD   40 mg at 05/14/23 9528  senna (SENOKOT) tablet 17.2 mg  2 tablet Oral BID Eustace Moore D, PA   17.2 mg at 05/14/23 4132  sodium bicarbonate tablet 650 mg  650 mg Oral TID Fay Records, MD   650 mg at 05/14/23 4401 Continuous Infusions:PRN Meds:.acetaminophen, camphor-menthoL, dextromethorphan-guaiFENesin, risperiDONE, [Held by provider] traZODoneReview of Systems See HPI PE:VS: Temp:  [97.6 ?F (36.4 ?C)-98.2 ?F (36.8 ?C)] 98 ?F (36.7 ?C)Pulse:  [70-79] 76Resp:  [16-20] 19BP: (133-167)/(69-76) 167/71SpO2:  [96 %-99 %] 98 %Weight change: SpO2:  [96 %-99 %] 98 % (03/16 0735)Ins//Outs: I/O last 3 completed shifts:In: 1068 [I.V.:1068]Out: 3000 [Urine:3000]Net over last 24hrs: Gen: Awake and alert  HEENT: No sunken eyes, MMMNeck: No JVDCV: RRR, nls1s2, no murmurs or friction rubsResp: CTAB, no ralesAbd: soft, nontender, nondistended, nonpalpable bladder, no CVA tendernessSkin/Ext: No edema.  Foley/Urine:  Light straw colored. Neuro: No asterixisLABS:Lab Results Component Value Date  WBC 8.6 05/14/2023  HGB 9.0 (L) 05/14/2023  HCT 28.70 (L) 05/14/2023  PLT 281 05/14/2023  NA 140 05/14/2023  K 4.5 05/14/2023  CL 110 (H) 05/14/2023  CO2 19 (L) 05/14/2023  BUN 38 (H) 05/14/2023  CREATININE 3.53 (H) 05/14/2023  GLU 104 (H) 05/14/2023  CALCIUM 8.0 (L) 05/14/2023  PHOS 3.6 05/10/2023  PTH 81.5 (H) 05/12/2023 MICROBIOLOGY:Reviewed RADIOLOGY:Reviewed Assessment: 89 y.o. male with PMH of HTN,CAD, HFpEF, carotid artery stenosis, spinal stenosis, BPH wih chronic indwelling foley and CKD who presented with an unwitnessed fall, found to have UTI on Unasyn. Hospital course complicated by worsening of the Cr for which Nephrology is consulted. His AKI on CKD is likely multifactorial, patient has CKD stage 4 to begin with, presented with AKI likely post renal iso retention and hydronephrosis on imaging then received IV contrast on 10 then 24 hours later worsening of the kidney noted which is likely the cause. Inflammatory ATI is in the differential due to the current ATI. AIN due to the recent antibiotic use is also in the low differential. Renal Plan:# AKI on CKD - His UOP is high which is possibly post obstruction diuresis, please monitor closely and replete with IVF to avoid dehydration and further worsening of the kidney function. - Please order VBG to assess the acid base status. - Avoid NSAIDs, and IV iodinated contrast outside of emergent situations- Accurately document the I/Os- Daily Standing weights if possible  - Dose medications to CrCl- MAP goal >65Final recommendations pending attending attestation. Alanee Ting Alani3/16/2025 9:51 AM

## 2023-05-15 LAB — CBC WITHOUT DIFFERENTIAL
BKR WAM ANC (ABSOLUTE NEUTROPHIL COUNT): 7.2 x 1000/ÂµL (ref 2.00–7.60)
BKR WAM HEMATOCRIT (2 DEC): 33.4 % — ABNORMAL LOW (ref 38.50–50.00)
BKR WAM HEMOGLOBIN: 10 g/dL — ABNORMAL LOW (ref 13.2–17.1)
BKR WAM MCH (PG): 22.3 pg — ABNORMAL LOW (ref 27.0–33.0)
BKR WAM MCHC: 29.9 g/dL — ABNORMAL LOW (ref 31.0–36.0)
BKR WAM MCV: 74.4 fL — ABNORMAL LOW (ref 80.0–100.0)
BKR WAM MPV: 9.8 fL (ref 8.0–12.0)
BKR WAM PLATELETS: 309 x1000/ÂµL (ref 150–420)
BKR WAM RDW-CV: 15.1 % — ABNORMAL HIGH (ref 11.0–15.0)
BKR WAM RED BLOOD CELL COUNT.: 4.49 M/ÂµL (ref 4.00–6.00)
BKR WAM WHITE BLOOD CELL COUNT: 9.7 x1000/ÂµL (ref 4.0–11.0)

## 2023-05-15 LAB — BASIC METABOLIC PANEL
BKR ANION GAP: 12 (ref 7–17)
BKR BLOOD UREA NITROGEN: 33 mg/dL — ABNORMAL HIGH (ref 8–23)
BKR BUN / CREAT RATIO: 10.1 (ref 8.0–23.0)
BKR CALCIUM: 8.5 mg/dL — ABNORMAL LOW (ref 8.8–10.2)
BKR CHLORIDE: 110 mmol/L — ABNORMAL HIGH (ref 98–107)
BKR CO2: 19 mmol/L — ABNORMAL LOW (ref 20–30)
BKR CREATININE DELTA: -0.25
BKR CREATININE: 3.28 mg/dL — ABNORMAL HIGH (ref 0.40–1.30)
BKR EGFR, CREATININE (CKD-EPI 2021): 17 mL/min/{1.73_m2} — ABNORMAL LOW (ref >=60)
BKR GLUCOSE: 100 mg/dL (ref 70–100)
BKR POTASSIUM: 4.8 mmol/L (ref 3.3–5.3)
BKR SODIUM: 141 mmol/L (ref 136–144)

## 2023-05-15 LAB — BLOOD GAS, VENOUS (BH YH)
BKR BASE EXCESS, VENOUS: -5 mmol/L
BKR CALCULATED HCO3, VENOUS: 20.7 mmol/L
BKR O2 SATURATION VENOUS: 71 %
BKR PCO2, VENOUS: 43 mmHg (ref 38–54)
BKR PH, VENOUS: 7.3 U — ABNORMAL LOW (ref 7.32–7.43)
BKR PO2, VENOUS: 41 mmHg (ref 40–50)
BKR TEMPERATURE (VENOUS BG COLL QUESTION): 98.1 [degF]

## 2023-05-15 LAB — MAGNESIUM: BKR MAGNESIUM: 2.1 mg/dL (ref 1.7–2.4)

## 2023-05-15 NOTE — Plan of Care
 Inpatient Physical Therapy Progress Note IP Adult PT Eval/Treat - 05/15/23 1140    Date of Visit / Treatment  Date of Visit / Treatment 05/15/23   Start Time 1116   End Time 1140   Total Treatment Time 24    General Information  Subjective Agreeable to PT.   General Observations RN cleared PT. Pt supine in bed.   Precautions/Limitations Fall Precautions;Bed alarm    Vital Signs and Orthostatic Vital Signs  Vital Signs Vital Signs Stable   Vital Signs Free text RA    Pain/Comfort  Pain Comment (Pre/Post Treatment Pain) Generalized pain everywhere. RN aware.    Cognition  Orientation Level Oriented to person   Level of Consciousness lethargic   Following Commands Follows one step commands with increased time;Follows one step commands with repetition   Administrator, arts / Judgment Fall risk   Cognition Comments Cooperative    Manual Muscle Testing  Manual Muscle Testing Comments deconditioned    Musculoskeletal  LUE Muscle Strength Grading 3-->active movement against gravity   RUE Muscle Strength Grading 3-->active movement against gravity   LLE Muscle Strength Grading 3-->active movement against gravity   RLE Muscle Strength Grading 3-->active movement against gravity    Skin Assessment  Skin Assessment See Nursing Documentation    Balance  Sitting Balance: Static  FAIR-      Contact Guard to maintain static position with no Assistive Device   Sitting Balance: Dynamic  POOR+   Moves through 1/2 range with minimal assist to right self   Standing Balance: Static POOR      Moderate assist to maintain static position with no Assistive Device   Standing Balance: Dynamic  POOR-    Unable to move from midline voluntarily, maximal assist to right self   Balance Assist Device Rolling walker   Balance Skills Training Comment A x 1    Bed Mobility  Supine-to-Sit Independence/Assistance Level Moderate assist;Assist of 1 Supine-to-Sit Assist Device Hand held assist;Head of bed elevated   Sit-to-Supine Independence/Assistance Level Moderate assist;Assist of 1   Sit-to-Supine Assist Device Hand held assist   Bed Mobility, Impairments strength decreased;decreased endurance/activity tolerance;impaired balance   Bed Mobility Comments A with trunk and LEs. Sitting EOB wiuth CGA x 1.    Sit-Stand Transfer Training  Sit-to-Stand Transfer Independence/Assistance Level Maximum assist;Assist of 1   Sit-to-Stand Transfer Assist Device Rolling walker   Stand-to-Sit Transfer Independence/Assistance Level Maximum assist;Assist of 1   Stand-to-Sit Transfer Assist Device Rolling walker   Transfer Safety Analysis Concerns cues for hand placement   Transfer Safety Analysis Impairments impaired balance;decreased strength;decreased endurance/activity tolerance   Sit-Stand Transfer Comments Slow to rise. Elevated bed.   Stand-Sit Transfer Comments Poor eccentric control    Gait Training  Independence/Assistance Level  Unable to perform    Handoff Documentation  Handoff Patient in bed;Bed alarm;Patient instructed to call nursing for mobility;Discussed with nursing    Activity Tolerance  Activity Tolerance Comments Fair-    PT- AM-PAC - Basic Mobility Screen- How much help from another person do you currently need.....  Turning from your back to your side while in a a flat bed without using rails? 3 - A Little - Requires a little help (supervision, minimal assistance). Can use assistive devices.   Moving from lying on your back to sitting on the side of a flat bed without using bed rails? 2 - A Lot - Requires a lot of help (maximum to moderate assistance). Can use assistive devices.   Moving  to and from a bed to a chair (including a wheelchair)? 2 - A Lot - Requires a lot of help (maximum to moderate assistance). Can use assistive devices.   Standing up from a chair using your arms(e.g., wheelchair or bedside chair)? 2 - A Lot - Requires a lot of help (maximum to moderate assistance). Can use assistive devices.   To walk in a hospital room? 1 - Total - Requires total assistance or cannot do it at all.   Climbing 3-5 steps with a railing? 1 - Total - Requires total assistance or cannot do it at all.   AMPAC Mobility Score 11   TARGET Highest Level of Mobility Mobility Level 4, Transfer to chair   ACTUAL Highest Level of Mobility Mobility Level 4, Transfer to chair    Therapeutic Exercise  Therapeutic Exercise Comments AAROM x 4    Clinical Impression  Follow up Assessment Pt presents with decreased strength and endurance. Imapired balance. Lethargic. Cooperative. Deconditioned. Fall risk. Pt will benefit from PT services to address deficits.    Patient/Family Stated Goals  Patient/Family Stated Goal(s) feel better    Frequency/Equipment Recommendations  PT Frequency 3x per week   Next Treatment Expected 05/17/23   PT/PTA completing this assessment DSG    PT Recommendations for Inpatient Admission  Activity/Level of Assist out of bed;transfers only;assist of 2;moderate assistance;with rolling walker   ADL Recommendations bariatric commode   Positioning chair position of the bed;reposition frequently;elevate heels   Therapeutic Exercise encourage exercise program issued    Planned Treatment / Interventions  Plan for Next Visit Progress as tol   Training Treatment / Interventions Balance / Gait Training;Endurance Training;Functional Personnel officer / Interventions Patient Education / Training    PT Discharge Summary  Physical Therapy Disposition Recommendation Moderate complexity support and therapy to progress functional mobility/ ADLs/ IADLs recommended for post- acute care.  See assessment for additional details.   Additional Therapy Recommendations Physical Therapy Services in Discharge Environment [place], [place] #[phone]

## 2023-05-15 NOTE — Plan of Care
 Plan of Care Overview/ Patient StatusProblem: Adult Inpatient Plan of CareGoal: Readiness for Transition of CareOutcome: Interventions implemented as appropriate MHB message to EP rehab coordinator to have PT see this patient again for updated PT note for insurance authorization. Patient accepted at [facility], has insurance auth through today, 03/17. Not medically ready per covering provider.Care management continue to follow.Trish Mage, RN, [facility] Manager [facility]

## 2023-05-15 NOTE — Plan of Care
 Plan of Care Overview/ Patient StatusA&Ox4. RA. Able to verbalize needs. No acute changes. Denies CP, SOB, N/V/D. Hypertensive into 170s systolic, asymptomatic, provided notified, no new orders, continued monitoring. Medicated per MAR. IV infiltrated, new IV placed. Patent and flushes well. Takes pills whole. Safety maintained, frequent rounding. Bed in low/bed alarm on. Call bell within reach. See flowsheets for more info. Unable to obtain venous blood gas. Next rn notified. Problem: Adult Inpatient Plan of CareGoal: Plan of Care ReviewOutcome: Interventions implemented as appropriateGoal: Patient-Specific Goal (Individualized)Outcome: Interventions implemented as appropriateGoal: Absence of Hospital-Acquired Illness or InjuryOutcome: Interventions implemented as appropriateGoal: Optimal Comfort and WellbeingOutcome: Interventions implemented as appropriateGoal: Readiness for Transition of CareOutcome: Interventions implemented as appropriate Problem: Physical Therapy GoalsGoal: Physical Therapy GoalsDescription: PT GOALS1. Patient will perform bed mobility independently2. Patient will perform transfers with least assistive device with modified independence3. Patient will ambulate a minimum of 150 feet with least assistive device with modified independence4. Patient will ascend and descend at least 5 steps with modified independenceOutcome: Interventions implemented as appropriate Problem: Restraint, NonviolentGoal: Absence of Harm or InjuryOutcome: Interventions implemented as appropriate Problem: WoundGoal: Optimal CopingOutcome: Interventions implemented as appropriateGoal: Optimal Functional AbilityOutcome: Interventions implemented as appropriateGoal: Absence of Infection Signs and SymptomsOutcome: Interventions implemented as appropriateGoal: Improved Oral IntakeOutcome: Interventions implemented as appropriateGoal: Optimal Pain Control and FunctionOutcome: Interventions implemented as appropriateGoal: Skin Health and IntegrityOutcome: Interventions implemented as appropriateGoal: Optimal Wound HealingOutcome: Interventions implemented as appropriate Problem: Violence Risk or ActualGoal: Anger and Impulse ControlOutcome: Interventions implemented as appropriate Problem: Fall Injury RiskGoal: Absence of Fall and Fall-Related InjuryOutcome: Interventions implemented as appropriate Problem: Skin Injury Risk IncreasedGoal: Skin Health and IntegrityOutcome: Interventions implemented as appropriate

## 2023-05-15 NOTE — Progress Notes
 [facility] Medicine Progress NoteAttending Provider: Lyndle Herrlich, MD [phone]                                            Subjective: Interim History: Patient reports he is doing fine, just trying to get in touch with his daughter to bring him some things. Denies any CP, SOB or abdominal pin. Review of Allergies/Meds/Hx: Review of Allergies/Meds/Hx:I have reviewed the patient's: current scheduled medications, current infusions, current prn medications, and past medical historyObjective: Vitals:I have reviewed the patient's current vital signs as documented in the patient's EMR.   and Last 24 hours: Temp:  [97.8 ?F (36.6 ?C)-98.2 ?F (36.8 ?C)] 98.1 ?F (36.7 ?C)Pulse:  [64-81] 64Resp:  [15-18] 15BP: (139-175)/(72-82) 171/82SpO2:  [94 %-99 %] 94 %I/O's:I have reviewed the patient's current I&O's as documented in the EMR.Gross Totals (Last 24 hours) at 05/15/2023 1445Last data filed at 05/15/2023 0624Intake -- Output 1957 ml Net -1957 ml Procedures:None Physical Exam:Physical ExamConstitutional:     Comments: Pleasant elderly male sitting up in bed in NAD HENT:    Head: Normocephalic. Cardiovascular:    Rate and Rhythm: Normal rate and regular rhythm. Pulmonary:    Effort: Pulmonary effort is normal.    Breath sounds: Normal breath sounds. Abdominal:    General: Bowel sounds are normal.    Palpations: Abdomen is soft. Genitourinary:   Comments: Foley draining clear yellow urineMusculoskeletal:    Right lower leg: No edema.    Left lower leg: No edema. Neurological:    Mental Status: He is oriented to person, place, and time. Labs:I have reviewed the patient's labs within the last 24 hrs.Na 141K 4.8Cr 3.28Glucose 100WBC 9.7H&H 10.0/33.40Platelets 309Diagnostics:No new radiology.ECG/Tele Events: No ECG ordered todayAssessment: Assessment:89 y.o. male PMHx HTN, CAD, HFpEF, carotid artery stenosis, spinal stenosis, BPH with chronic indwelling foley who was brought in after an unwitnessed fall, trauma work-up unremarkable aside from laceration to L Hand requiring sutures, admitted with UTI. Ccb agitation likely 2/2 hospital delirium briefly requiring restraint. Now w/ AKI on CKD 2/2 obstructive uropathy, improved with Foley change and UTI tx. Plan: Unwitnessed fall- Unclear if mechanical or syncopal - Trauma work-up unremarkable- Orthostatics negative - Trop significantly elevated, but EKG and TTE unremarkable - [facility] for STR on d/c AKI on CKD4- Appreciate renal consult (signed off today)- Continues to improve- Continue sodium bicarbAcute Toxic Metabolic Encephalopathy- Resolved- Likely i/s/o delirium and UTI  HTN- Not on antihypertensives at home- Holding midodrine (PRN at home) Laceration to L 3rd finger- Sutured in ED 3/10- Healing well- Remove 10-14 days after sutured (3/20-3/24) UTI w/ Chronic Foley, BPH - Completed Unasyn (day 5/5) given hx of Enterococcus  CAD, HFpEF- Continue ASA and Plavix- Continue statin  Depression- Continue Lexapro  Spinal Stenosis- Continue gabapentin 200 mg QHS    Comorbidities Comorbidities present on admission:Chronic Heart Failure with preserved ejection fraction, last EF: 65% 05/10/2023. 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)Chronic Anemia  Secondary diagnoses occurring during hospitalization:Hypocalcemia Code Status: Full Code Med Rec: completeDiet: Diet Renal Non DialysisPPx: SQH	Dispo: [facility] tomorrow if Cr continues to improve Electronically Signed:Edman Lipsey, PA-CContact via MHB3/17/2025 2:36 PMMedical decision making for this patient was moderate.I spent 35 minutes today on this encounter before, during, and after the visit, reviewing labs and records, evaluating and examining the patient, entering orders and documenting the visit.

## 2023-05-15 NOTE — Significant Event
 Alerted by RN that patient walked to the bathroom had BM, and when he came back to bed he was very weak and almost fell. Very pale and diaphoretic. VS 158/66, HR 84%, 99% on RA.Went to see patient with RN and PCA at bedside. Patient resting. Reports he feels better now. Did not feel dizzy or lightheaded during episode, only felt diaphoretic. Reports he wants to rest. RN reports patient's color improved. PCA explained patient had BM and had to stand to be wiped for a little bit. On exam, elderly male sitting up in bed in NAD. Skin dry, warm.PERRL EOMIFacial movements symmetricalBUE and BLE strength 5/5 diffuselyAAOx3Assessment: likely vasovagal eventPlan: Check orthostatic VS (laying and sitting given concern to stand patient up right now)Patient going to rest and then eat dinnerLindsey Romano3/17/2025 5:32 PM

## 2023-05-15 NOTE — Plan of Care
 Problem: Adult Inpatient Plan of CareGoal: Plan of Care ReviewOutcome: Interventions implemented as appropriate Problem: Physical Therapy GoalsGoal: Physical Therapy GoalsDescription: PT GOALS1. Patient will perform bed mobility independently2. Patient will perform transfers with least assistive device with modified independence3. Patient will ambulate a minimum of 150 feet with least assistive device with modified independence4. Patient will ascend and descend at least 5 steps with modified independenceOutcome: Interventions implemented as appropriate Plan of Care Overview/ Patient StatusPlan of care in progressNo complaints of pain No tele, room air Vss (notified provider of elevated BP)BP (!) 171/82  - Pulse 64  - Temp 98.1 ?F (36.7 ?C)  - Resp 15  - Ht 5' 11 (1.803 m)  - Wt 84 kg  - SpO2 94%  - BMI 25.83 kg/m? Foley to gravity draining yellow urineAmbulate x1 assist with they walkerEating all meals and taking all medicationsPlan to go to rehab tomorrowCall bell in reach, bed locked and in low position Safety maintatined

## 2023-05-15 NOTE — Plan of Care
 05/15/23 1719 05/15/23 1720 Vital Signs Pulse 80 77 Resp 20 20 BP 133/68 137/72 NIBP MAP (Monitor/Manual Entry) 89 94 NIBP MAP (mmHg) (calculated/READ ONLY) 89 94 BP Location Left arm Left arm BP Method Automatic Automatic Patient Position Lying Sitting Patient ambulated to bathroom and on the way back became weak. Large bowel movement in bathroom and stood for a long period of time getting cleaned with pca before mobilizing back to bed PCA called this writer in room to help. Patient sitting slumped on the side of the bed. Noted to be pale and slightly diaphoretic. Vs taken Patient pallor returned and states he fells better. Call placed to provider. Orthos taken, provider at bedside No new orders at this timePlan of care ongoing

## 2023-05-15 NOTE — Plan of Care
 Plan of Care Overview/ Patient StatusProblem: Adult Inpatient Plan of CareGoal: Readiness for Transition of Care3/17/2025 1427 by Trish Mage, RNOutcome: Interventions implemented as appropriate3/17/2025 1052 by Trish Mage, RNOutcome: Interventions implemented as appropriate Message to [facility] to see if bed available. They are waiting on DC list.PT note in for updated insurance auth. DC needs placed for IM.Patient would need ambulance transport.Care management following for bed availability tomorrow.Trish Mage, RN, [facility] Manager [facility]

## 2023-05-15 NOTE — Progress Notes
 Brief Nephrology NotePatient seen by nephrology for AKI on CKD secondary to obstructive uropathy which is now improving following foley catheter change and treatment of UTI. Nephrology will sign off. Please reach out with any Reine Just, MDNephrology Fellow

## 2023-05-16 LAB — BASIC METABOLIC PANEL
BKR ANION GAP: 12 (ref 7–17)
BKR BLOOD UREA NITROGEN: 33 mg/dL — ABNORMAL HIGH (ref 8–23)
BKR BUN / CREAT RATIO: 10 (ref 8.0–23.0)
BKR CALCIUM: 8.3 mg/dL — ABNORMAL LOW (ref 8.8–10.2)
BKR CHLORIDE: 107 mmol/L (ref 98–107)
BKR CO2: 20 mmol/L (ref 20–30)
BKR CREATININE DELTA: 0.02
BKR CREATININE: 3.3 mg/dL — ABNORMAL HIGH (ref 0.40–1.30)
BKR EGFR, CREATININE (CKD-EPI 2021): 17 mL/min/{1.73_m2} — ABNORMAL LOW (ref >=60)
BKR GLUCOSE: 110 mg/dL — ABNORMAL HIGH (ref 70–100)
BKR POTASSIUM: 4.6 mmol/L (ref 3.3–5.3)
BKR SODIUM: 139 mmol/L (ref 136–144)

## 2023-05-16 LAB — VITAMIN D 1,25 (RENAL DISEASE OR HYPERCALCEMIA)(MINMETABLAB)(YH): VITAMIN D 1,25-DIHYDROXY: 18 pg/mL — ABNORMAL LOW (ref 26–95)

## 2023-05-16 NOTE — Progress Notes
 [facility] Medicine Progress NoteAttending Provider: Lyndle Herrlich, MD [phone]                                            Subjective: Interim History: Patient reports feeling better this morning. Pre-syncopal episode last night. Has not been OOB yet this AM, but ate all his breakfast. No diaphoresis. No chest pain or SOB.Review of Allergies/Meds/Hx: Review of Allergies/Meds/Hx:I have reviewed the patient's: current scheduled medications, current infusions, current prn medications, and past medical historyObjective: Vitals:I have reviewed the patient's current vital signs as documented in the patient's EMR.   and Last 24 hours: Temp:  [97.5 ?F (36.4 ?C)-99.8 ?F (37.7 ?C)] 97.5 ?F (36.4 ?C)Pulse:  [76-92] 76Resp:  [17-22] 17BP: (133-165)/(66-82) 162/72SpO2:  [92 %-100 %] 100 %I/O's:I have reviewed the patient's current I&O's as documented in the EMR.Gross Totals (Last 24 hours) at 05/16/2023 1154Last data filed at 05/16/2023 0900Intake 240 ml Output 3400 ml Net -3160 ml Procedures:None Physical Exam:Physical ExamConstitutional:     Comments: Pleasant elderly male sitting up in bed in NAD HENT:    Head: Normocephalic. Cardiovascular:    Rate and Rhythm: Normal rate and regular rhythm. Pulmonary:    Effort: Pulmonary effort is normal.    Breath sounds: Normal breath sounds. Abdominal:    General: Abdomen is flat. Bowel sounds are normal.    Palpations: Abdomen is soft. Genitourinary:   Comments: Foley draining clear yellow urineMusculoskeletal:    Right lower leg: No edema.    Left lower leg: No edema. Neurological:    Mental Status: He is oriented to person, place, and time. Labs:I have reviewed the patient's labs within the last 24 hrs.Na 139K 4.6Cr 3.30Glucose 110Diagnostics:No new radiology.ECG/Tele Events: No ECG ordered todayAssessment: Assessment:89 y.o. male PMHx HTN, CAD, HFpEF, carotid artery stenosis, spinal stenosis, BPH with chronic indwelling foley who was brought in after an unwitnessed fall, trauma work-up unremarkable aside from laceration to L Hand requiring sutures, admitted with UTI. Ccb agitation likely 2/2 hospital delirium briefly requiring restraint. Now w/ AKI on CKD 2/2 obstructive uropathy, improved with Foley change and UTI tx. Plan: Unwitnessed fall- Unclear if mechanical or syncopal - Trauma work-up unremarkable- Orthostatics negative - Trop significantly elevated, but EKG and TTE unremarkable - [facility] for STR on d/c Vasovagal presyncope- Yesterday afternoon after having BM and walking back to bed- Orthostatics negativeAKI on CKD4- Appreciate renal consult (signed off)- Plateaued today- Repeat BMP tomorrow- Continue sodium bicarbAcute Toxic Metabolic Encephalopathy- Resolved- Likely i/s/o delirium and UTI  HTN- Not on antihypertensives at home- Holding midodrine (PRN at home) L finger Laceration - Sutured in ED 3/10- Healing well- Remove 10-14 days after sutured (3/20-3/24) UTI w/ Chronic Foley, BPH - Completed 5 days ampicillin given hx of Enterococcus  CAD, HFpEF- Continue ASA and Plavix- Continue statin  Depression- Continue Lexapro  Spinal Stenosis- Continue gabapentin 200 mg QHS    Comorbidities Comorbidities present on admission:Chronic Heart Failure with preserved ejection fraction, last EF: 65% 05/10/2023. 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)Chronic Anemia  Secondary diagnoses occurring during hospitalization:Hypocalcemia Code Status: Full Code Med Rec: completeDiet: Diet Renal Non DialysisPPx: SQH	Dispo: [facility] tomorrow if bed available Electronically Signed:Jakala Herford, PA-CContact via MHB3/18/2025 11:54 AMMedical decision making for this patient was moderate.I spent 35 minutes today on this encounter before, during, and after the visit, reviewing labs and records, evaluating and examining the patient, entering orders and documenting  the visit.

## 2023-05-16 NOTE — Plan of Care
 Plan of Care Overview/ Patient Status1900-0700 Pt is alert and oriented x2,  VS are WNL, RA. No c/o pain. Refused dinner but encouraged to eat  the slice of cake and fruit cup. Fluids encouraged. Sleeping well this shift. Foley cath patent draining clear yellow urine. Bed alarm for safety. WCTM

## 2023-05-16 NOTE — Care Coordination-Inpatient
 INSURANCE AUTH OBTAINED FOR STR 05/16/23 1039 Authorization Information Date Authorization Initiated:  05/16/23 Time Authorization Initiated: 1040 Mode Clinical was sent: Payor Comm Facility Name [facility] Authorization Yes, CM Alaister updated Insurance Company [employer] Medicare Insurance Co. Solicitor Name/# Home and [employer] Authorization #/Details [id number], 3/19-3/21, 3 Days Date Authorization recieved 05/16/23 Time Authorization recieved 1222 Follow up contact necessary Yes Contact Name Home and Community Contact phone: [phone] Insurance Co. Fax number: [phone] [facility] NilanTransition CoordinatorCare Management DepartmentYale-[facility] [place], [place] Phone: [phone]

## 2023-05-16 NOTE — Plan of Care
 Plan of Care Overview/ Patient StatusProblem: Adult Inpatient Plan of CareGoal: Readiness for Transition of CareOutcome: Interventions implemented as appropriate Follow up message sent to [facility] on bed availability. Aware the patient is medically ready today, will need updated insurance auth.TC spoke with daughter, wants [facility] for [employer] Ambulance transport.1015: [facility] will have a bed tomorrow, 03/19 for this patient. Placed on insurance auth list. TC aware for need for IM. Care management continue to follow.Trish Mage, RN, [facility] Manager [facility]

## 2023-05-16 NOTE — Plan of Care
 Plan of Care Overview/ Patient Status0700-1900 Received patient resting in bed. No s/s of resp/cardiac distress noted. No complain of pain/discomfort. Schedule meds given tolerated well. OOB with rolling walker, ambulates to the bathroom. Patient has foley cath patent draining yellow urine. LG BM today. Patient resting in bed watching T.V. Safety maintain

## 2023-05-16 NOTE — Care Coordination-Inpatient
 PENDING INSURANCE AUTHORIZATION THIS PATIENT CANNOT BE DISCHARGED UNTIL DETERMINATION OBTAINEDSecured facility: [facility] with [facility]'s [facility]. Clinical information submitted to this patient's insurer for review today. We now await the authorization that will allow this patient to transfer to the facility.   Insurance Contact: home and CommunityPhone: [phone]: [phone] Case No.: [id number] [facility] CoordinatorCare Management DepartmentY[facility] [place], [place] Phone: [phone]

## 2023-05-16 NOTE — Discharge Instructions
It was a pleasure taking care of you. If you have any questions, please do not hesitate to call, [phone].

## 2023-05-17 ENCOUNTER — Encounter
Admit: 2023-05-17 | Payer: PRIVATE HEALTH INSURANCE | Attending: Student in an Organized Health Care Education/Training Program | Primary: Internal Medicine

## 2023-05-17 DIAGNOSIS — N400 Enlarged prostate without lower urinary tract symptoms: Secondary | ICD-10-CM

## 2023-05-17 DIAGNOSIS — E785 Hyperlipidemia, unspecified: Secondary | ICD-10-CM

## 2023-05-17 DIAGNOSIS — K219 Gastro-esophageal reflux disease without esophagitis: Secondary | ICD-10-CM

## 2023-05-17 DIAGNOSIS — D509 Iron deficiency anemia, unspecified: Secondary | ICD-10-CM

## 2023-05-17 DIAGNOSIS — I2510 Atherosclerotic heart disease of native coronary artery without angina pectoris: Secondary | ICD-10-CM

## 2023-05-17 DIAGNOSIS — N179 Acute kidney failure, unspecified: Secondary | ICD-10-CM

## 2023-05-17 DIAGNOSIS — R296 Repeated falls: Secondary | ICD-10-CM

## 2023-05-17 DIAGNOSIS — M4800 Spinal stenosis, site unspecified: Secondary | ICD-10-CM

## 2023-05-17 DIAGNOSIS — I10 Essential (primary) hypertension: Secondary | ICD-10-CM

## 2023-05-17 DIAGNOSIS — I5030 Unspecified diastolic (congestive) heart failure: Secondary | ICD-10-CM

## 2023-05-17 DIAGNOSIS — Z7409 Other reduced mobility: Secondary | ICD-10-CM

## 2023-05-17 DIAGNOSIS — N184 Chronic kidney disease, stage 4 (severe): Secondary | ICD-10-CM

## 2023-05-17 LAB — BASIC METABOLIC PANEL
BKR ANION GAP: 11 (ref 7–17)
BKR BLOOD UREA NITROGEN: 33 mg/dL — ABNORMAL HIGH (ref 8–23)
BKR BUN / CREAT RATIO: 9.9 (ref 8.0–23.0)
BKR CALCIUM: 8.3 mg/dL — ABNORMAL LOW (ref 8.8–10.2)
BKR CHLORIDE: 107 mmol/L (ref 98–107)
BKR CO2: 19 mmol/L — ABNORMAL LOW (ref 20–30)
BKR CREATININE DELTA: 0.04
BKR CREATININE: 3.34 mg/dL — ABNORMAL HIGH (ref 0.40–1.30)
BKR EGFR, CREATININE (CKD-EPI 2021): 16 mL/min/{1.73_m2} — ABNORMAL LOW (ref >=60)
BKR GLUCOSE: 114 mg/dL — ABNORMAL HIGH (ref 70–100)
BKR POTASSIUM: 4.5 mmol/L (ref 3.3–5.3)
BKR SODIUM: 137 mmol/L (ref 136–144)

## 2023-05-17 MED ORDER — OMEPRAZOLE 20 MG CAPSULE,DELAYED RELEASE
20 | ORAL_CAPSULE | Freq: Every day | ORAL | 100 refills | 14.00 days | Status: AC
Start: 2023-05-17 — End: ?
  Filled 2023-05-17: qty 14, 14d supply, fill #0

## 2023-05-17 MED ORDER — ACETAMINOPHEN 325 MG TABLET
325 | Freq: Four times a day (QID) | ORAL | Status: AC | PRN
Start: 2023-05-17 — End: ?

## 2023-05-17 MED ORDER — CAMPHOR-MENTHOL 0.5 %-0.5 % LOTION
0.5-0.5 | Freq: Two times a day (BID) | TOPICAL | Status: AC | PRN
Start: 2023-05-17 — End: ?

## 2023-05-17 MED ORDER — CLOPIDOGREL 75 MG TABLET
75 | ORAL_TABLET | Freq: Every day | ORAL | 100 refills | 14.00 days | Status: AC
Start: 2023-05-17 — End: ?
  Filled 2023-05-17: qty 14, 14d supply, fill #0

## 2023-05-17 MED ORDER — NITROGLYCERIN 0.4 MG SUBLINGUAL TABLET
0.4 | ORAL_TABLET | SUBLINGUAL | 100 refills | 8.00 days | Status: AC | PRN
Start: 2023-05-17 — End: ?
  Filled 2023-05-17: qty 25, 8d supply, fill #0

## 2023-05-17 MED ORDER — MIDODRINE 2.5 MG TABLET
2.5 | Freq: Three times a day (TID) | ORAL | Status: AC | PRN
Start: 2023-05-17 — End: ?
  Filled 2023-05-17: qty 14, 5d supply, fill #0

## 2023-05-17 MED ORDER — IPRATROPIUM BROMIDE 21 MCG (0.03 %) NASAL SPRAY
210.03 | Freq: Two times a day (BID) | NASAL | 100 refills | 50.00 days | Status: AC
Start: 2023-05-17 — End: ?
  Filled 2023-05-17: qty 30, 50d supply, fill #0

## 2023-05-17 MED ORDER — MIDODRINE 2.5 MG TABLET
2.5 | ORAL_TABLET | Freq: Three times a day (TID) | ORAL | 100 refills | 5.00 days | Status: AC | PRN
Start: 2023-05-17 — End: ?

## 2023-05-17 MED ORDER — ESCITALOPRAM 10 MG TABLET
10 | ORAL_TABLET | Freq: Every day | ORAL | 100 refills | 14.00 days | Status: AC
Start: 2023-05-17 — End: ?
  Filled 2023-05-17: qty 14, 14d supply, fill #0

## 2023-05-17 MED ORDER — SENNOSIDES 8.6 MG TABLET
8.6 | Freq: Two times a day (BID) | ORAL | Status: AC
Start: 2023-05-17 — End: ?

## 2023-05-17 MED ORDER — GABAPENTIN 100 MG CAPSULE
100 | ORAL_CAPSULE | Freq: Every evening | ORAL | 100 refills | 14.00 days | Status: AC
Start: 2023-05-17 — End: ?
  Filled 2023-05-17: qty 28, 14d supply, fill #0

## 2023-05-17 MED ORDER — DEXTROMETHORPHAN-GUAIFENESIN 10 MG-100 MG/5 ML ORAL SYRUP
10-1005 | ORAL | Status: AC | PRN
Start: 2023-05-17 — End: ?

## 2023-05-17 MED ORDER — ATORVASTATIN 80 MG TABLET
80 | ORAL_TABLET | Freq: Every day | ORAL | 100 refills | 14.00 days | Status: AC
Start: 2023-05-17 — End: ?
  Filled 2023-05-17: qty 14, 14d supply, fill #0
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
## 2023-05-17 NOTE — Plan of Care
 Plan of Care Overview/ Patient Status1900-0700 Pt is alert and oriented x 2,  VS are WNL, RA. No c/o pain. Fluids encouraged. Sleeping well this shift. Foley cath patent draining clear yellow urine. Bed alarm for safety. WCTM

## 2023-05-17 NOTE — Plan of Care
 Plan of Care Overview/ Patient StatusProblem: Adult Inpatient Plan of CareGoal: Readiness for Transition of CareOutcome: Outcome(s) achieved DC to STR this morning. Call to daughter [name]. Discussed transport this morning, pre-11am to [place] for STR. Discussed transportation and that with the patients AMPAC, he should qualify for ambulance. CM did discuss that insurances may not pay for the ambulance trip. Other options are private pay chair car (billed after the ride is completed) or family transport. Daughter did not feel comfortable transporting the patient. She preferred ambulance transport.Covering provider aware the facility can accept pre-11am today.Covering nurse aware of pre-11am dc toSTR via ambulance. Aware to call report to [phone]. Aware to call daughter when ambulance is leaving so she can meet the patient at [facility].Case Management Plan  Flowsheet Row Most Recent Value Discharge Planning  Patient/Patient Representative goals/treatment preferences for discharge are:  DC to STR Patient/Patient Representative was presented with a list of facilities, agencies and/or dme providers and Referral(s) placed for: Short term rehabilitation (at a nursing facility) Facility/Geographical Preference(s) [facility] Bed has been secured and is available on: 05/17/23 Facility Name [facility] Transportation  Ambulance (add comment for special considerations) Transportation Company [facility] CM D/C Readiness  PASRR completed and approved Yes  [PASRR approved (Assessment ID # [id number]) 05/10/2023] Authorization number obtained, if required Yes  [[id number], 3/19-3/21, 3 Days] Is there a 3 day INPATIENT Qualifying stay for Medicare Patients? N/A Medicare IM- signed, dated, timed and scanned, if required Yes  [JM 3/18] DME Authorized/Delivered N/A No needs identified/ follow up with PCP/MD N/A Post acute care services secured W10 complete Yes Pri Completed and Accepted N/A Is the destination address correct on the W10 Yes Finalized Plan  Expected Discharge Date 05/17/23 Discharge Disposition Skilled Nursing Facility   Trish Mage, RN, [facility] Manager [facility]

## 2023-05-18 ENCOUNTER — Ambulatory Visit: Admit: 2023-05-18 | Payer: PRIVATE HEALTH INSURANCE | Attending: Cardiovascular Disease | Primary: Internal Medicine

## 2023-05-18 DIAGNOSIS — I6522 Occlusion and stenosis of left carotid artery: Secondary | ICD-10-CM

## 2023-05-18 DIAGNOSIS — E785 Hyperlipidemia, unspecified: Secondary | ICD-10-CM

## 2023-05-18 DIAGNOSIS — Z955 Presence of coronary angioplasty implant and graft: Secondary | ICD-10-CM

## 2023-05-18 DIAGNOSIS — I2510 Atherosclerotic heart disease of native coronary artery without angina pectoris: Secondary | ICD-10-CM

## 2023-05-18 DIAGNOSIS — I129 Hypertensive chronic kidney disease with stage 1 through stage 4 chronic kidney disease, or unspecified chronic kidney disease: Secondary | ICD-10-CM

## 2023-05-18 MED ORDER — MICONAZOLE NITRATE 2 % TOPICAL OINTMENT
2 | 100 refills | Status: AC
Start: 2023-05-18 — End: ?

## 2023-05-23 ENCOUNTER — Other Ambulatory Visit
Admit: 2023-05-23 | Payer: PRIVATE HEALTH INSURANCE | Attending: Student in an Organized Health Care Education/Training Program | Primary: Internal Medicine

## 2023-05-23 DIAGNOSIS — S61412D Laceration without foreign body of left hand, subsequent encounter: Secondary | ICD-10-CM

## 2023-05-23 DIAGNOSIS — W19XXXD Unspecified fall, subsequent encounter: Secondary | ICD-10-CM

## 2023-05-23 DIAGNOSIS — I5033 Acute on chronic diastolic (congestive) heart failure: Secondary | ICD-10-CM

## 2023-05-23 DIAGNOSIS — I200 Unstable angina: Secondary | ICD-10-CM

## 2023-05-23 DIAGNOSIS — N390 Urinary tract infection, site not specified: Secondary | ICD-10-CM

## 2023-05-23 DIAGNOSIS — N184 Chronic kidney disease, stage 4 (severe): Secondary | ICD-10-CM

## 2023-05-23 DIAGNOSIS — D649 Anemia, unspecified: Secondary | ICD-10-CM

## 2023-05-23 DIAGNOSIS — G9341 Metabolic encephalopathy: Secondary | ICD-10-CM

## 2023-05-24 LAB — BASIC METABOLIC PANEL
BKR ANION GAP: 11 (ref 7–17)
BKR BLOOD UREA NITROGEN: 59 mg/dL — ABNORMAL HIGH (ref 8–23)
BKR BUN / CREAT RATIO: 15.9 (ref 8.0–23.0)
BKR CALCIUM: 8.4 mg/dL — ABNORMAL LOW (ref 8.8–10.2)
BKR CHLORIDE: 107 mmol/L (ref 98–107)
BKR CO2: 18 mmol/L — ABNORMAL LOW (ref 20–30)
BKR CREATININE DELTA: 0.36
BKR CREATININE: 3.7 mg/dL — ABNORMAL HIGH (ref 0.40–1.30)
BKR EGFR, CREATININE (CKD-EPI 2021): 14 mL/min/{1.73_m2} — ABNORMAL LOW (ref >=60)
BKR GLUCOSE: 128 mg/dL — ABNORMAL HIGH (ref 70–100)
BKR POTASSIUM: 4.5 mmol/L (ref 3.3–5.3)
BKR SODIUM: 136 mmol/L (ref 136–144)

## 2023-05-29 ENCOUNTER — Encounter: Admit: 2023-05-29 | Payer: PRIVATE HEALTH INSURANCE | Attending: Internal Medicine | Primary: Internal Medicine

## 2023-05-29 DIAGNOSIS — W19XXXD Unspecified fall, subsequent encounter: Secondary | ICD-10-CM

## 2023-05-29 DIAGNOSIS — R2681 Unsteadiness on feet: Secondary | ICD-10-CM

## 2023-05-29 DIAGNOSIS — E785 Hyperlipidemia, unspecified: Secondary | ICD-10-CM

## 2023-05-29 DIAGNOSIS — I5032 Chronic diastolic (congestive) heart failure: Secondary | ICD-10-CM

## 2023-05-29 DIAGNOSIS — I129 Hypertensive chronic kidney disease with stage 1 through stage 4 chronic kidney disease, or unspecified chronic kidney disease: Secondary | ICD-10-CM

## 2023-05-29 DIAGNOSIS — Z978 Presence of other specified devices: Secondary | ICD-10-CM

## 2023-05-29 DIAGNOSIS — N401 Enlarged prostate with lower urinary tract symptoms: Secondary | ICD-10-CM

## 2023-05-29 DIAGNOSIS — I10 Essential (primary) hypertension: Secondary | ICD-10-CM

## 2023-05-29 DIAGNOSIS — S61213D Laceration without foreign body of left middle finger without damage to nail, subsequent encounter: Secondary | ICD-10-CM

## 2023-05-29 MED FILL — GABAPENTIN 100 MG CAPSULE: 100 mg | ORAL | 14 days supply | Qty: 28 | Fill #1 | Status: CP

## 2023-05-30 ENCOUNTER — Encounter: Admit: 2023-05-30 | Payer: PRIVATE HEALTH INSURANCE | Attending: Internal Medicine | Primary: Internal Medicine

## 2023-05-30 DIAGNOSIS — D568 Other thalassemias: Secondary | ICD-10-CM

## 2023-05-30 DIAGNOSIS — J329 Chronic sinusitis, unspecified: Secondary | ICD-10-CM

## 2023-05-30 DIAGNOSIS — I6529 Occlusion and stenosis of unspecified carotid artery: Secondary | ICD-10-CM

## 2023-05-30 DIAGNOSIS — I252 Old myocardial infarction: Secondary | ICD-10-CM

## 2023-05-30 DIAGNOSIS — E782 Mixed hyperlipidemia: Secondary | ICD-10-CM

## 2023-05-30 DIAGNOSIS — J309 Allergic rhinitis, unspecified: Secondary | ICD-10-CM

## 2023-05-30 DIAGNOSIS — Z955 Presence of coronary angioplasty implant and graft: Secondary | ICD-10-CM

## 2023-05-30 DIAGNOSIS — D509 Iron deficiency anemia, unspecified: Secondary | ICD-10-CM

## 2023-05-30 DIAGNOSIS — E785 Hyperlipidemia, unspecified: Secondary | ICD-10-CM

## 2023-05-30 DIAGNOSIS — M4800 Spinal stenosis, site unspecified: Secondary | ICD-10-CM

## 2023-05-30 DIAGNOSIS — H9193 Unspecified hearing loss, bilateral: Secondary | ICD-10-CM

## 2023-05-30 DIAGNOSIS — I10 Essential (primary) hypertension: Secondary | ICD-10-CM

## 2023-05-30 DIAGNOSIS — K219 Gastro-esophageal reflux disease without esophagitis: Secondary | ICD-10-CM

## 2023-05-30 DIAGNOSIS — N400 Enlarged prostate without lower urinary tract symptoms: Secondary | ICD-10-CM

## 2023-05-30 DIAGNOSIS — I5030 Unspecified diastolic (congestive) heart failure: Secondary | ICD-10-CM

## 2023-05-30 DIAGNOSIS — N184 Chronic kidney disease, stage 4 (severe): Secondary | ICD-10-CM

## 2023-05-30 DIAGNOSIS — I714 AAA (abdominal aortic aneurysm) (HC Code): Secondary | ICD-10-CM

## 2023-05-30 DIAGNOSIS — I2510 Atherosclerotic heart disease of native coronary artery without angina pectoris: Secondary | ICD-10-CM

## 2023-05-31 MED FILL — OMEPRAZOLE 20 MG CAPSULE,DELAYED RELEASE: 20 mg | ORAL | 14 days supply | Qty: 14 | Fill #1 | Status: CP

## 2023-05-31 MED FILL — ESCITALOPRAM 10 MG TABLET: 10 mg | ORAL | 14 days supply | Qty: 14 | Fill #1 | Status: CP

## 2023-05-31 MED FILL — CLOPIDOGREL 75 MG TABLET: 75 mg | ORAL | 14 days supply | Qty: 14 | Fill #1 | Status: CP

## 2023-05-31 MED FILL — ATORVASTATIN 80 MG TABLET: 80 mg | ORAL | 14 days supply | Qty: 14 | Fill #1 | Status: CP

## 2023-06-07 ENCOUNTER — Encounter: Admit: 2023-06-07 | Payer: PRIVATE HEALTH INSURANCE | Attending: Internal Medicine | Primary: Internal Medicine

## 2023-06-07 ENCOUNTER — Inpatient Hospital Stay: Admit: 2023-06-07 | Discharge: 2023-06-07 | Primary: Internal Medicine

## 2023-06-07 DIAGNOSIS — S61213D Laceration without foreign body of left middle finger without damage to nail, subsequent encounter: Secondary | ICD-10-CM

## 2023-06-07 DIAGNOSIS — R0902 Hypoxemia: Secondary | ICD-10-CM

## 2023-06-07 DIAGNOSIS — N401 Enlarged prostate with lower urinary tract symptoms: Secondary | ICD-10-CM

## 2023-06-07 DIAGNOSIS — W19XXXD Unspecified fall, subsequent encounter: Secondary | ICD-10-CM

## 2023-06-07 DIAGNOSIS — N179 Acute kidney failure, unspecified: Secondary | ICD-10-CM

## 2023-06-07 DIAGNOSIS — Z978 Presence of other specified devices: Secondary | ICD-10-CM

## 2023-06-07 MED ORDER — SODIUM BICARBONATE 650 MG TABLET
650 | Freq: Three times a day (TID) | ORAL | 1.00 refills | 22.50000 days | Status: AC
Start: 2023-06-07 — End: ?

## 2023-06-07 MED ORDER — ESCITALOPRAM 10 MG TABLET
10 | ORAL_TABLET | Freq: Every day | ORAL | 1 refills | 90.00000 days | Status: AC
Start: 2023-06-07 — End: ?

## 2023-06-07 MED ORDER — SENNOSIDES 8.6 MG TABLET
8.6 | Freq: Two times a day (BID) | ORAL | 1.00 refills | 15.00000 days | Status: AC
Start: 2023-06-07 — End: ?

## 2023-06-07 MED ORDER — CLOPIDOGREL 75 MG TABLET
75 | ORAL_TABLET | Freq: Every day | ORAL | 1 refills | 90.00000 days | Status: AC
Start: 2023-06-07 — End: ?

## 2023-06-07 MED ORDER — MIDODRINE 2.5 MG TABLET
2.5 | ORAL_TABLET | Freq: Three times a day (TID) | ORAL | 100 refills | 30.00000 days | Status: AC | PRN
Start: 2023-06-07 — End: 2023-08-03

## 2023-06-07 MED ORDER — ASPIRIN 81 MG TABLET,DELAYED RELEASE
81 | Freq: Every day | ORAL | 0.00 refills | 90.00000 days | Status: AC
Start: 2023-06-07 — End: 2023-09-21

## 2023-06-07 MED ORDER — GABAPENTIN 100 MG CAPSULE
100 | ORAL_CAPSULE | Freq: Every evening | ORAL | 1 refills | 30.00000 days | Status: AC
Start: 2023-06-07 — End: ?

## 2023-06-07 MED ORDER — CAMPHOR-MENTHOL 0.5 %-0.5 % LOTION
0.5-0.5 | Freq: Two times a day (BID) | TOPICAL | 2.00 refills | 30.00000 days | Status: AC | PRN
Start: 2023-06-07 — End: ?

## 2023-06-07 MED ORDER — ATORVASTATIN 80 MG TABLET
80 | ORAL_TABLET | Freq: Every day | ORAL | 1 refills | 90.00000 days | Status: AC
Start: 2023-06-07 — End: ?

## 2023-06-07 MED ORDER — OMEPRAZOLE 20 MG CAPSULE,DELAYED RELEASE
20 | ORAL_CAPSULE | Freq: Every day | ORAL | 1 refills | 90.00000 days | Status: AC
Start: 2023-06-07 — End: 2023-08-03

## 2023-06-09 ENCOUNTER — Emergency Department: Admit: 2023-06-09 | Payer: Medicare (Managed Care) | Primary: Internal Medicine

## 2023-06-09 ENCOUNTER — Inpatient Hospital Stay
Admit: 2023-06-09 | Discharge: 2023-06-14 | Payer: Medicare (Managed Care) | Attending: Hospitalist | Admitting: Internal Medicine

## 2023-06-09 DIAGNOSIS — R531 Weakness: Secondary | ICD-10-CM

## 2023-06-09 LAB — SARS-COV-2 (COVID-19)/INFLUENZA A+B/RSV BY RT-PCR (BH GH LMW YH)
BKR INFLUENZA A: NEGATIVE
BKR INFLUENZA B: NEGATIVE
BKR RESPIRATORY SYNCYTIAL VIRUS: NEGATIVE
BKR SARS-COV-2 RNA (COVID-19) (YH): POSITIVE — AB

## 2023-06-09 LAB — URINE MICROSCOPIC     (BH GH LMW YH)

## 2023-06-09 LAB — BASIC METABOLIC PANEL
BKR ANION GAP: 15 (ref 7–17)
BKR BLOOD UREA NITROGEN: 52 mg/dL — ABNORMAL HIGH (ref 8–23)
BKR BUN / CREAT RATIO: 14.1 (ref 8.0–23.0)
BKR CALCIUM: 8.7 mg/dL — ABNORMAL LOW (ref 8.8–10.2)
BKR CHLORIDE: 104 mmol/L (ref 98–107)
BKR CO2: 18 mmol/L — ABNORMAL LOW (ref 20–30)
BKR CREATININE DELTA: 0
BKR CREATININE: 3.7 mg/dL — ABNORMAL HIGH (ref 0.40–1.30)
BKR EGFR, CREATININE (CKD-EPI 2021): 14 mL/min/{1.73_m2} — ABNORMAL LOW (ref >=60)
BKR GLUCOSE: 192 mg/dL — ABNORMAL HIGH (ref 70–100)
BKR POTASSIUM: 4.8 mmol/L (ref 3.3–5.3)
BKR SODIUM: 137 mmol/L (ref 136–144)

## 2023-06-09 LAB — CBC WITH AUTO DIFFERENTIAL
BKR WAM ABSOLUTE IMMATURE GRANULOCYTES.: 0.02 x 1000/ÂµL (ref 0.00–0.30)
BKR WAM ABSOLUTE LYMPHOCYTE COUNT.: 1.01 x 1000/ÂµL (ref 0.60–3.70)
BKR WAM ABSOLUTE NRBC (2 DEC): 0 x 1000/ÂµL (ref 0.00–1.00)
BKR WAM ANC (ABSOLUTE NEUTROPHIL COUNT): 2.77 x 1000/ÂµL (ref 2.00–7.60)
BKR WAM BASOPHIL ABSOLUTE COUNT.: 0.02 x 1000/ÂµL (ref 0.00–1.00)
BKR WAM BASOPHILS: 0.5 % (ref 0.0–1.4)
BKR WAM EOSINOPHIL ABSOLUTE COUNT.: 0.19 x 1000/ÂµL (ref 0.00–1.00)
BKR WAM EOSINOPHILS: 4.3 % (ref 0.0–5.0)
BKR WAM HEMATOCRIT (2 DEC): 29.4 % — ABNORMAL LOW (ref 38.50–50.00)
BKR WAM HEMOGLOBIN: 9.3 g/dL — ABNORMAL LOW (ref 13.2–17.1)
BKR WAM IMMATURE GRANULOCYTES: 0.5 % (ref 0.0–1.0)
BKR WAM LYMPHOCYTES: 22.9 % (ref 17.0–50.0)
BKR WAM MCH (PG): 22.4 pg — ABNORMAL LOW (ref 27.0–33.0)
BKR WAM MCHC: 31.6 g/dL (ref 31.0–36.0)
BKR WAM MCV: 70.8 fL — ABNORMAL LOW (ref 80.0–100.0)
BKR WAM MONOCYTE ABSOLUTE COUNT.: 0.4 x 1000/ÂµL (ref 0.00–1.00)
BKR WAM MONOCYTES: 9.1 % (ref 4.0–12.0)
BKR WAM MPV: 9.2 fL (ref 8.0–12.0)
BKR WAM NEUTROPHILS: 62.7 % (ref 39.0–72.0)
BKR WAM NUCLEATED RED BLOOD CELLS: 0 % (ref 0.0–1.0)
BKR WAM PLATELETS: 226 x1000/ÂµL (ref 150–420)
BKR WAM RDW-CV: 15.9 % — ABNORMAL HIGH (ref 11.0–15.0)
BKR WAM RED BLOOD CELL COUNT.: 4.15 M/ÂµL (ref 4.00–6.00)
BKR WAM WHITE BLOOD CELL COUNT: 4.4 x1000/ÂµL (ref 4.0–11.0)

## 2023-06-09 LAB — URINALYSIS WITH CULTURE REFLEX      (BH LMW YH)
BKR BILIRUBIN, UA: NEGATIVE
BKR GLUCOSE, UA: NEGATIVE
BKR KETONES, UA: NEGATIVE
BKR NITRITE, UA: NEGATIVE
BKR PH, UA: 6.5 (ref 5.5–7.5)
BKR SPECIFIC GRAVITY, UA: 1.01 (ref 1.005–1.030)
BKR UROBILINOGEN, UA (MG/DL): <2 mg/dL (ref <=2.0)

## 2023-06-09 LAB — HEPATIC FUNCTION PANEL
BKR A/G RATIO: 1 (ref 1.0–2.2)
BKR ALANINE AMINOTRANSFERASE (ALT): 20 U/L (ref 9–59)
BKR ALBUMIN: 3.6 g/dL (ref 3.6–5.1)
BKR ALKALINE PHOSPHATASE: 89 U/L (ref 9–122)
BKR ASPARTATE AMINOTRANSFERASE (AST): 36 U/L — ABNORMAL HIGH (ref 10–35)
BKR AST/ALT RATIO: 1.8
BKR BILIRUBIN DIRECT: 0.2 mg/dL (ref <=0.2)
BKR BILIRUBIN TOTAL: 0.4 mg/dL (ref <=1.2)
BKR GLOBULIN: 3.7 g/dL (ref 2.0–3.9)
BKR PROTEIN TOTAL: 7.3 g/dL (ref 5.9–8.3)

## 2023-06-09 LAB — MAGNESIUM: BKR MAGNESIUM: 2.1 mg/dL (ref 1.7–2.4)

## 2023-06-09 LAB — TROPONIN T HIGH SENSITIVITY, 1 HOUR WITH REFLEX (BH GH LMW YH)
BKR TROPONIN T HS 1 HOUR DELTA FROM 0 HOUR: -24 ng/L
BKR TROPONIN T HS 1 HOUR: 89 ng/L — ABNORMAL HIGH

## 2023-06-09 LAB — TROPONIN T HIGH SENSITIVITY, 3 HOUR (BH GH LMW YH)
BKR TROPONIN T HS 1 HOUR DELTA FROM 0 HOUR ON 3HR: -24 ng/L
BKR TROPONIN T HS 3 HOUR DELTA FROM 0 HOUR: -24 ng/L
BKR TROPONIN T HS 3 HOUR: 89 ng/L — ABNORMAL HIGH

## 2023-06-09 LAB — C-REACTIVE PROTEIN     (CRP): BKR C-REACTIVE PROTEIN, HIGH SENSITIVITY: 7.6 mg/L — ABNORMAL HIGH

## 2023-06-09 LAB — MRSA SCREEN BY PCR: BKR MRSA COLONIZATION STATUS PCR: DETECTED — AB

## 2023-06-09 LAB — PARTIAL THROMBOPLASTIN TIME     (BH GH LMW Q YH): BKR PARTIAL THROMBOPLASTIN TIME: 25.4 s (ref 23.0–31.0)

## 2023-06-09 LAB — TROPONIN T HIGH SENSITIVITY, 0 HOUR BASELINE WITH REFLEX (BH GH LMW YH): BKR TROPONIN T HS 0 HOUR BASELINE: 113 ng/L — CR

## 2023-06-09 LAB — UA REFLEX CULTURE

## 2023-06-09 LAB — PROTIME AND INR
BKR INR: 1.06 (ref 0.87–1.13)
BKR PROTHROMBIN TIME: 11.4 s (ref 9.5–12.1)

## 2023-06-09 LAB — NT-PROBNPE: BKR B-TYPE NATRIURETIC PEPTIDE, PRO (PROBNP): 5072 pg/mL — ABNORMAL HIGH (ref <450.0)

## 2023-06-09 LAB — REFLEX LACTIC ACID, PLASMA: BKR LACTATE: 1.2 mmol/L (ref 0.4–2.0)

## 2023-06-09 LAB — LIPASE: BKR LIPASE: 54 U/L (ref 11–55)

## 2023-06-09 MED ORDER — CLOPIDOGREL 75 MG TABLET
75 | Freq: Every day | ORAL | Status: DC
Start: 2023-06-09 — End: 2023-06-14
  Administered 2023-06-10 – 2023-06-14 (×5): 75 mg via ORAL

## 2023-06-09 MED ORDER — IOHEXOL 350 MG IODINE/ML INTRAVENOUS SOLUTION
350 | Freq: Once | INTRAVENOUS | Status: CP | PRN
Start: 2023-06-09 — End: ?
  Administered 2023-06-09: 18:00:00 350 mL via INTRAVENOUS

## 2023-06-09 MED ORDER — ZZ IMS TEMPLATE
ORAL | Status: DC
Start: 2023-06-09 — End: 2023-06-09

## 2023-06-09 MED ORDER — ESCITALOPRAM 10 MG TABLET
10 | Freq: Every day | ORAL | Status: DC
Start: 2023-06-09 — End: 2023-06-14
  Administered 2023-06-10 – 2023-06-14 (×5): 10 mg via ORAL

## 2023-06-09 MED ORDER — GABAPENTIN 100 MG CAPSULE
100 | Freq: Every evening | ORAL | Status: DC
Start: 2023-06-09 — End: 2023-06-14
  Administered 2023-06-10 – 2023-06-13 (×4): 100 mg via ORAL

## 2023-06-09 MED ORDER — POLYETHYLENE GLYCOL 3350 17 GRAM ORAL POWDER PACKET
17 | Freq: Every day | ORAL | Status: DC
Start: 2023-06-09 — End: 2023-06-12
  Administered 2023-06-10 – 2023-06-12 (×2): 17 gram via ORAL

## 2023-06-09 MED ORDER — VANCOMYCIN 1.5 G IN 500 ML IVPB (VIALMATE)
Freq: Once | INTRAVENOUS | Status: CP
Start: 2023-06-09 — End: ?
  Administered 2023-06-09: 13:00:00 500.000 mL/h via INTRAVENOUS

## 2023-06-09 MED ORDER — VANCOMYCIN MAR LEVEL
Freq: Once | INTRAVENOUS | Status: DC
Start: 2023-06-09 — End: 2023-06-10

## 2023-06-09 MED ORDER — SENNOSIDES 8.6 MG TABLET
8.6 | Freq: Two times a day (BID) | ORAL | Status: DC
Start: 2023-06-09 — End: 2023-06-12
  Administered 2023-06-10 – 2023-06-12 (×3): 8.6 mg via ORAL

## 2023-06-09 MED ORDER — PIPERACILLIN-TAZOBACTAM (ZOSYN) 2.25GM MBP - ED FIRST DOSE
Freq: Once | INTRAVENOUS | Status: CP
Start: 2023-06-09 — End: ?
  Administered 2023-06-09: 13:00:00 50.000 mL/h via INTRAVENOUS

## 2023-06-09 MED ORDER — FRUIT JUICE
ORAL | Status: DC | PRN
Start: 2023-06-09 — End: 2023-06-14

## 2023-06-09 MED ORDER — SODIUM BICARBONATE 650 MG TABLET
650 | Freq: Three times a day (TID) | ORAL | Status: DC
Start: 2023-06-09 — End: 2023-06-14
  Administered 2023-06-10 – 2023-06-14 (×13): 650 mg via ORAL

## 2023-06-09 MED ORDER — MIDODRINE 2.5 MG TABLET
2.5 | Freq: Three times a day (TID) | ORAL | Status: DC | PRN
Start: 2023-06-09 — End: 2023-06-14
  Administered 2023-06-14: 12:00:00 2.5 mg via ORAL

## 2023-06-09 MED ORDER — ASPIRIN 81 MG TABLET,DELAYED RELEASE
81 | Freq: Every day | ORAL | Status: DC
Start: 2023-06-09 — End: 2023-06-14
  Administered 2023-06-10 – 2023-06-14 (×5): 81 mg via ORAL

## 2023-06-09 MED ORDER — DEXAMETHASONE SODIUM PHOSPHATE (PF) 10 MG/ML INJECTION SOLUTION
10 | INTRAVENOUS | Status: DC
Start: 2023-06-09 — End: 2023-06-11
  Administered 2023-06-09 – 2023-06-10 (×2): 10 mL via INTRAVENOUS

## 2023-06-09 MED ORDER — FAMOTIDINE 20 MG TABLET
20 | Freq: Every day | ORAL | Status: DC
Start: 2023-06-09 — End: 2023-06-14
  Administered 2023-06-10 – 2023-06-14 (×5): 20 mg via ORAL

## 2023-06-09 MED ORDER — ROSUVASTATIN 40 MG TABLET
40 | Freq: Every day | ORAL | Status: DC
Start: 2023-06-09 — End: 2023-06-14
  Administered 2023-06-10 – 2023-06-14 (×5): 40 mg via ORAL

## 2023-06-09 MED ORDER — PIPERACILLIN-TAZOBACTAM (ZOSYN) 2.25GM MBP
Freq: Four times a day (QID) | INTRAVENOUS | Status: DC
Start: 2023-06-09 — End: 2023-06-10
  Administered 2023-06-09: 18:00:00 50.000 mL/h via INTRAVENOUS

## 2023-06-09 MED ORDER — GLUCAGON 1 MG/ML IN STERILE WATER
Freq: Once | INTRAMUSCULAR | Status: DC | PRN
Start: 2023-06-09 — End: 2023-06-14

## 2023-06-09 MED ORDER — PIPERACILLIN-TAZOBACTAM (ZOSYN) 2.25GM MBP
Freq: Four times a day (QID) | INTRAVENOUS | Status: DC
Start: 2023-06-09 — End: 2023-06-10
  Administered 2023-06-10 (×2): 50.000 mL/h via INTRAVENOUS

## 2023-06-09 MED ORDER — SODIUM CHLORIDE 0.9 % (FLUSH) INJECTION SYRINGE
0.9 | INTRAVENOUS | Status: DC | PRN
Start: 2023-06-09 — End: 2023-06-14

## 2023-06-09 MED ORDER — DEXTROSE 10 % IV BOLUS FOR D10 & D50 ORDERABLE FOR HYPOGLYCEMIA (DROPS CHARGE)
INTRAVENOUS | Status: DC | PRN
Start: 2023-06-09 — End: 2023-06-14

## 2023-06-09 MED ORDER — MELATONIN 3 MG TABLET
3 | Freq: Every evening | ORAL | Status: DC | PRN
Start: 2023-06-09 — End: 2023-06-14
  Administered 2023-06-10 – 2023-06-13 (×3): 3 mg via ORAL

## 2023-06-09 MED ORDER — DEXTROSE 15 GRAM/60 ML ORAL LIQUID
15 | ORAL | Status: DC | PRN
Start: 2023-06-09 — End: 2023-06-14

## 2023-06-09 MED ORDER — SKIM MILK
ORAL | Status: DC | PRN
Start: 2023-06-09 — End: 2023-06-14

## 2023-06-09 MED ORDER — HEPARIN (PORCINE) 5,000 UNIT/ML INJECTION SOLUTION
5000 | Freq: Three times a day (TID) | SUBCUTANEOUS | Status: DC
Start: 2023-06-09 — End: 2023-06-11
  Administered 2023-06-09 – 2023-06-11 (×5): 5000 mL via SUBCUTANEOUS

## 2023-06-09 MED ORDER — REMDESIVIR 100 MG POWDER IN 250 ML NS IVPB (VIALMATE)
INTRAVENOUS | Status: CP
Start: 2023-06-09 — End: ?
  Administered 2023-06-10 – 2023-06-13 (×4): 250.000 mL/h via INTRAVENOUS

## 2023-06-09 MED ORDER — REMDESIVIR 200 MG POWDER IN 250 ML NS IVPB
Freq: Once | INTRAVENOUS | Status: CP
Start: 2023-06-09 — End: ?
  Administered 2023-06-09: 14:00:00 250.000 mL/h via INTRAVENOUS

## 2023-06-09 MED ORDER — ACETAMINOPHEN 325 MG TABLET
325 | Freq: Four times a day (QID) | ORAL | Status: DC | PRN
Start: 2023-06-09 — End: 2023-06-14

## 2023-06-09 MED ORDER — SODIUM CHLORIDE 0.9 % IV BOLUS NEW BAG (DROPS CHARGE)
0.9 | Freq: Once | INTRAVENOUS | Status: CP
Start: 2023-06-09 — End: ?
  Administered 2023-06-09: 13:00:00 0.9 mL/h via INTRAVENOUS

## 2023-06-09 MED ORDER — VANCOMYCIN THERAPY PLACEHOLDER
INTRAVENOUS | Status: DC
Start: 2023-06-09 — End: 2023-06-10

## 2023-06-09 MED ORDER — SODIUM CHLORIDE 0.9 % LARGE VOLUME SYRINGE FOR AUTOINJECTOR
Freq: Once | INTRAVENOUS | Status: CP | PRN
Start: 2023-06-09 — End: ?
  Administered 2023-06-09: 18:00:00 via INTRAVENOUS

## 2023-06-09 MED ORDER — SODIUM CHLORIDE 0.9 % (FLUSH) INJECTION SYRINGE
0.9 | Freq: Three times a day (TID) | INTRAVENOUS | Status: DC
Start: 2023-06-09 — End: 2023-06-14
  Administered 2023-06-09 – 2023-06-14 (×14): 0.9 mL via INTRAVENOUS

## 2023-06-09 MED ORDER — VANCOMYCIN 1 G IN 250 ML IVPB (VIALMATE)
INTRAVENOUS | Status: DC
Start: 2023-06-09 — End: 2023-06-10

## 2023-06-09 NOTE — Pharmacy Discharge Note
 VANCOMYCIN CONSULT Current Vancomycin Order: 1 g IV per laboratory/placeholder data (received 1.5 g x 1 dose in ED on 4/11 @ ~1300) Day of Therapy: 1Vancomycin Indication: Suspected MRSA infection - MRSA PCR (+)Renal Function: AKI on CKDScr: Creatinine (mg/dL) Date Value 96/05/5407 3.70 (H) 05/24/2023 3.70 (H) 05/17/2023 3.34 (H)  CrCl: Estimated Creatinine Clearance: 13 mL/min (A) (by C-G formula based on SCr of 3.7 mg/dL (H)).Level: No results found for: VANCORANDOM, VANCOTROUGHLab Results Component Value Date/Time  MRSA Colonization Status by PCR Detected (A) 06/09/2023 12:12 PM No results found for: MRSA SURVEILLANCE PCRTrough Goal: 10-15Vancomycin level: Yes, indicated based on the following: Rapid change in renal functionLevel: Random Level on 4/12 at 1300Y[facility]HS Vancomycin Pharmacist-Driven Dosing & Monitoring Protocol for Adults Neonatal and Pediatric Vancomycin Dosing GuidelineFor questions, please contact the pharmacist: Orman Blacksmith, PharmD         Phone/Mobile Heartbeat: MHB

## 2023-06-09 NOTE — ED Notes
 11:41 AM EMS Report Recent hx of admit for UTI; Home on Wednesday. Increased RR, confusion, lethargy. Baseline A+Ox4EMS Interventions: IV started 1L NSBGL:220  Pt has obvious increased WOB, RR 30-40s, Able to speak 1-2 words at a time on 4LNC. Pt placed on continuous tele monitoring and BIPAP 12/5. Pt alert to self, year, and that he is at the hospital (unsure which hospital). Foley replaced and urine sample obtained.1:53 PM Daughter at bedside and updated on POC. Pt respiratory status improved, increased WOB is resolved, RR now in the 20s and pt able to speak compete sentences. Pt states I feel good. Daughter taking him dirty clothes and watch and left bag with hearing aid charger and new clothing at bedside. 3:00 PMPt switched from BIPAP to NC and is tolerating very well, maintaining sat at 100%. 4:38 PM Daughter updated over the phone. Daughter requesting that she be called if anything changes Mark [name]; phone # listed under alternate contacts)7:17 PM Bedside report given to oncoming RN Report considered of patient's Situation, Background, Assessment, and RecommendationOpportunity for questions and clarification was provided

## 2023-06-09 NOTE — ED Notes
 7:15 PMVerbal handoff report received from outgoing nurse. This nurse assumed care of pt at this time. Pt presented to ED for SOB and weakness. Has had poor PO intake. Pt COVID +. Was on BiPAP and NC, now on RA. Pt has foley that was in place upon arrival. Pt on [facility] w/ bladder temp. Pt being admitted for COVID, pending admission bed. 8:12 PMPt visualized sleeping on stretcher w/ eyes closed. Unlabored respirations with equal chest rise and fall observed. Pt repositions self for comfort. Side rails up x2, bed in lowest position. 9:40 PMPt visualized sleeping on stretcher w/ eyes closed. Unlabored respirations with equal chest rise and fall observed. Pt repositions self for comfort. Side rails up x2, bed in lowest position. 10:30 PMPt awake, speaking clear complete sentences. Denies SOB. Pt medicated per MAR. Provided w/ pitcher of water. Passed swallow test and tolerated PO intake w/o difficulty. Pending transport. Floor Handoff Telemetry: 	[x]  Yes		[]  NoCode Status:   [x]  Full		[]  DNR		[]  DNI		Other (specify):Safety Precautions: [x]  Fall Risk  []  Sitter   []  Restraints	[]  Suicidal	[]  None	Other (specify):Mentation/Orientation:	 A&O (Self, person, place, time) x     4     	 Disoriented to:                    	 Special Accommodations: []  Hearing impaired   []  Blind  []  Nonverbal  []  Cognitive impairmentOxygenation Upon Admission: [x]  RA	[]  NC	[]  Venti  []  Simple Mask []  Other	Baseline O2 Status? [x]  Yes	[]  NoAmbulation: []  Independent	[]  Cane   []  Walker	[]  Wheelchair	[]  Bedbound		[]  Hemiplegic	[]  Paraplegic	[]  QuadraplegicEliminiation: []  Independent	[]  Commode	[]  Bedpan/Urinal  []  Straight Cath [x]  Foley cath			[]  Urostomy	[]  Colostomy	Other (specify):Diarrhea/Loose stool : []  1x within 24h  []  2x within 24h  []  3x within 24h  [x]  None 	C.Diff Order: 	[]  Ordered- needs to be collected             []  Collected-sent to lab             []  Resulted - Negative C.Diff             []  Resulted - Positive C.Diff[]  Not Ordered   [x]  N/ASkin Alteration: []  Pressure Injury []  Wound []  None [x]  Skin not assessedDiet: []  Regular/No order placed	[]  NPO		Other (specify): CardiacIV Access: [x]  PIV   []  PICC    []  Port    [] Central line    []  A-line    Other (specify)IVF/GTT Running Upon ED Departure? [x]  No	    []  Yes (specify):Outstanding Meds/Treatments/Tests: ZosynPatient Belongings:Are the belongings documented?          []  No	    []  YesIs someone taking belongings home?   []  No     []  Yes  Who? (specify)                                   Blanchie Bunkers, RN	11:16 PMPt's bladder temp 96.7. Pt provided w/ fresh warm blankets. Temp now 97.

## 2023-06-09 NOTE — ED Provider Notes
 Chief Complaint Patient presents with  Weakness   To ED for increasing weakness and poor PO intake. Noted to have rapid shallow breathing. Unclear when foley cathter was last changed. Speaking in 2-3 word sentences. HPI/PE:89 y.o. male PMHx HFpEF (EF 65% 05/10/23), CKD, CAD, HTN, hard of hearing, BPH with chronic indwelling foley, HLD with a recent admission to the hospital for UTI complicated by AKI secondary to hydronephrosis, hypoxemia with negative CTA PE study thought to be due to cold extremities who presents to emergency department for weakness and potential UTI.  Patient reports he generally feels unwell, has chronic, unchanged neck pain.  Found to be tachypneic by EMS, given IV fluids.  Patient denies any abdominal pain, nausea, vomiting, chest pain.  Does endorse shortness a breath.  No overt leg swelling.  Foley catheter has not been changed.  Denies any flank or back pain.  No trauma or falls.Ill-appearing, tachypneic to the 40s, blood pressure 101/53, afebrile, pulse of 75, SpO2 85% but poor reading signal.  Alert and oriented x4.  Head is atraumatic, normocephalic.  Nontender cervical spine.  Full range of motion of the neck.  No meningeal symptoms.  Alert and oriented x4.  Tachypneic to the 40s.  Lungs with decreased breath sounds bilaterally.  Abdomen is soft, nontender, nondistended.  No CVA tenderness.  Foley catheter draining cloudy urine.  No significant pedal edema bilaterally.MDM:89 year old who presents to emergency department for weakness, fatigue, cloudy urine and tachypnea.  Lungs with decreased breath sounds bilaterally.  Differential diagnosis includes sepsis versus UTI versus pyelonephritis versus intra-abdominal infection versus pneumonia versus COVID versus electrolyte disturbance versus anemia - plan for broad-spectrum antibiotics, blood cultures, lactate, chest x-ray, UA, intra abdominal labs including hepatic panel, lipase, CBC to assess for anemia, BMP to assess for electrolyte and/or creatinine elevation and consideration of BiPAPConsidered ACS versus arrhythmia - plan for EKG, troponinConsidered PE but patient had hypoxemia on last admission with negative CTA PE study and no history of making this diagnosis less likelyPlanCBCBMPHepatic panelLipaseUAMagPT/INRPTT4plexCXREKGTropBiPaPBedside echoBroad spectrum abxFoley exhchangeIVFAdmission to hospitalLactic acid 3Venous pH 7.41, pC02 29Hepatic panel at baselineCBC without white count, hgb 9.3, PLT stable Lipase 2.1BNP 5072Coags normalTroponin 113 --->89BMP with creatinine 3.7, BUN 52, normal potassium, no anion gapCBC without white count, hemoglobin 9.3, platelets 226COVID positive ---> we will initiate remdesivir, Decadron via IV given patient is on BiPAPBedside echo without significant effusion, normal aortic root, normal right ventricle size, no significant plethora out to IVC.UA positive for infectionCXR clear per radiologyGiven marked hypoxia and tachypnea with normal chest x-ray, we will proceed with a CTA PE protocol and [place] abdomen and pelvis to assess for worsening hydronephrosis and/or intra-abdominal infection.Repeat VBG improving.  Normal lactate now.Patient reexamined.  Breathing more comfortably on BiPAP, we will trial off and reassess.  2:54 PMCTA without evidence of PECT abdomen and pelvis with chronic hydro, no acute intra abdominal pathology.Patient has been stable off BiPAP.  Vital signs reassuring.  Stable for floor admission.  Remdesivir and steroids given. RR mid 20's now, appears significantly more comfortable. Case discussed with Dr. Winnifred Friar An acute or life threatening problem was considered during this evaluation  A decision regarding hospitalization was made during this visit  External data reviewed: Labs, Radiology, ECG and Notes (OSH or non-ED) Physical ExamED Triage Vitals [06/09/23 1136]BP: (!) 101/53Pulse: 75Pulse from  O2 sat: n/aResp: (!) 38Temp: 97.4 ?F (36.3 ?C)Temp src: OralSpO2: (!) 85 % BP (!) 141/79  - Pulse (!) 59  - Temp (!) 96.6 ?F (35.9 ?C)  -  Resp 18  - Wt 84.2 kg  - SpO2 98%  - BMI 25.89 kg/m? Physical Exam ProceduresAttestation/Critical CarePatient Reevaluation: ED Attestation: PA/APRNFace to face evaluation was performed by me in collaboration with the Advanced Practice Provider to assess for significant health threats. I provided a substantive portion of the care of this patient.  I personally performed medically appropriate history and physical exam and the MDM: On my exam: mild respiratory distress, tachypneic to the 30s-40s, mildly coarse inspiratory breath sounds throughout bilateral lung fields, no definite wheezing; heart RRR; normal heart sounds; abdomen soft, NTND; strength and sensation grossly preserved throughout; no peripheral edemaMy differential includes: pneumonia, COPD with exacerbation, PE, viral respiratory infection, ARDS, sepsis, UTI, intraabdominal infection; low suspicion for CHF given recent reassuring echoAdditional acute and/or chronic problems addressed:Elias JaffaCritical care provided by attending: critical careCritical care time (minutes): 40.Critical care time was exclusive of separately billable procedures and teaching time.Critical care was necessary to treat or prevent imminent or life-threatening deterioration of the following conditions:  Respiratory failure.Critical care was time spent personally by me on the following activities: development of treatment plan with patient or surrogate, evaluation of patient's response to treatment, examination of patient, obtaining history from patient or surrogate, ordering and performing treatments and interventions, ordering and review of laboratory studies, ordering and review of radiographic studies, pulse oximetry, re-evaluation of patient's condition and review of old charts.Comments as of 06/09/23 2325 Fri Jun 09, 2023 1236 CXR reviewed personally.  Appears very similar to most recent prior, which was read as negative by radiology. [EJ] 1503 Patient signed out to me by Dr. Jaffa.  Here with generalized weakness and malaise.  Has UTI and COVID.  Was initially hypoxic, started on BiPAP.  Now off Bipap pending [place]'s [MR]  Comments User Index[EJ] Daphney Eans, MD[MR] Abner Hoffman, DO   Clinical Impressions as of 06/09/23 2325 COVID-19 virus infection Acute respiratory failure with hypoxia (HC Code) (HC CODE) (HC Code) Urinary tract infection without hematuria, site unspecified  ED DispositionAdmit  Daphney Eans, MD04/11/25 1246 [place] Blaze, PA04/11/25 1249 [place] Blaze, PA04/11/25 1337 [place] Blaze, PA04/11/25 1349 [place] Blaze, PA04/11/25 1412 [place] Blaze, PA04/11/25 1454 [place] Blaze, PA04/11/25 1858 [place] Blaze, PA04/11/25 1936 Abner Hoffman, DO04/11/25 2325

## 2023-06-09 NOTE — Utilization Review (ED)
 Utilization Review from XsolisPatient Data:  Patient Name: Rafel Garde Age: 89 y.o. DOB: 1925/10/02	 MRN: [id number]	 Indicated Status: Inpatient Review Comments: Patient admitted for COVID. Imaging reviewed, troponin 113-->89-->89, pBNP 5,072, + urinalysis- urine culture pending, SpO2 down to 85% on 4L NC-->placed on BiPAP, and titrated to 2L NC. IV Decadron, IV antibiotics, Remidesivir, and IV fluids ordered.

## 2023-06-10 ENCOUNTER — Telehealth: Admit: 2023-06-10 | Payer: PRIVATE HEALTH INSURANCE | Attending: Nephrology | Primary: Internal Medicine

## 2023-06-10 LAB — CBC WITH AUTO DIFFERENTIAL
BKR WAM ABSOLUTE IMMATURE GRANULOCYTES.: 0.01 x 1000/ÂµL (ref 0.00–0.30)
BKR WAM ABSOLUTE IMMATURE GRANULOCYTES.: 0.01 x 1000/ÂµL (ref 0.00–0.30)
BKR WAM ABSOLUTE LYMPHOCYTE COUNT.: 0.9 x 1000/ÂµL (ref 0.60–3.70)
BKR WAM ABSOLUTE LYMPHOCYTE COUNT.: 0.56 x 1000/ÂµL — ABNORMAL LOW (ref 0.60–3.70)
BKR WAM ABSOLUTE NRBC (2 DEC): 0 x 1000/ÂµL (ref 0.00–1.00)
BKR WAM ABSOLUTE NRBC (2 DEC): 0 x 1000/ÂµL (ref 0.00–1.00)
BKR WAM ANC (ABSOLUTE NEUTROPHIL COUNT): 1.35 x 1000/ÂµL — ABNORMAL LOW (ref 2.00–7.60)
BKR WAM ANC (ABSOLUTE NEUTROPHIL COUNT): 3.17 x 1000/ÂµL (ref 2.00–7.60)
BKR WAM BASOPHIL ABSOLUTE COUNT.: 0 x 1000/ÂµL (ref 0.00–1.00)
BKR WAM BASOPHIL ABSOLUTE COUNT.: 0 x 1000/ÂµL (ref 0.00–1.00)
BKR WAM BASOPHILS: 0 % (ref 0.0–1.4)
BKR WAM BASOPHILS: 0 % (ref 0.0–1.4)
BKR WAM EOSINOPHIL ABSOLUTE COUNT.: 0 x 1000/ÂµL (ref 0.00–1.00)
BKR WAM EOSINOPHIL ABSOLUTE COUNT.: 0 x 1000/ÂµL (ref 0.00–1.00)
BKR WAM EOSINOPHILS: 0 % (ref 0.0–5.0)
BKR WAM EOSINOPHILS: 0 % (ref 0.0–5.0)
BKR WAM HEMATOCRIT (2 DEC): 24.9 % — ABNORMAL LOW (ref 38.50–50.00)
BKR WAM HEMATOCRIT (2 DEC): 28 % — ABNORMAL LOW (ref 38.50–50.00)
BKR WAM HEMOGLOBIN: 7.9 g/dL — ABNORMAL LOW (ref 13.2–17.1)
BKR WAM HEMOGLOBIN: 8.9 g/dL — ABNORMAL LOW (ref 13.2–17.1)
BKR WAM IMMATURE GRANULOCYTES: 0.4 % (ref 0.0–1.0)
BKR WAM IMMATURE GRANULOCYTES: 0.2 % (ref 0.0–1.0)
BKR WAM LYMPHOCYTES: 34.7 % (ref 17.0–50.0)
BKR WAM LYMPHOCYTES: 14 % — ABNORMAL LOW (ref 17.0–50.0)
BKR WAM MCH (PG): 22.1 pg — ABNORMAL LOW (ref 27.0–33.0)
BKR WAM MCH (PG): 23.2 pg — ABNORMAL LOW (ref 27.0–33.0)
BKR WAM MCHC: 31.7 g/dL (ref 31.0–36.0)
BKR WAM MCHC: 31.8 g/dL (ref 31.0–36.0)
BKR WAM MCV: 69.7 fL — ABNORMAL LOW (ref 80.0–100.0)
BKR WAM MCV: 73.1 fL — ABNORMAL LOW (ref 80.0–100.0)
BKR WAM MONOCYTE ABSOLUTE COUNT.: 0.33 x 1000/ÂµL (ref 0.00–1.00)
BKR WAM MONOCYTE ABSOLUTE COUNT.: 0.27 x 1000/ÂµL (ref 0.00–1.00)
BKR WAM MONOCYTES: 12.7 % — ABNORMAL HIGH (ref 4.0–12.0)
BKR WAM MONOCYTES: 6.7 % (ref 4.0–12.0)
BKR WAM MPV: 10.1 fL (ref 8.0–12.0)
BKR WAM MPV: 9.7 fL (ref 8.0–12.0)
BKR WAM NEUTROPHILS: 52.2 % (ref 39.0–72.0)
BKR WAM NEUTROPHILS: 79.1 % — ABNORMAL HIGH (ref 39.0–72.0)
BKR WAM NUCLEATED RED BLOOD CELLS: 0 % (ref 0.0–1.0)
BKR WAM NUCLEATED RED BLOOD CELLS: 0 % (ref 0.0–1.0)
BKR WAM PLATELETS: 193 x1000/ÂµL (ref 150–420)
BKR WAM PLATELETS: 195 x1000/ÂµL (ref 150–420)
BKR WAM RDW-CV: 15.8 % — ABNORMAL HIGH (ref 11.0–15.0)
BKR WAM RDW-CV: 17.3 % — ABNORMAL HIGH (ref 11.0–15.0)
BKR WAM RED BLOOD CELL COUNT.: 3.57 M/ÂµL — ABNORMAL LOW (ref 4.00–6.00)
BKR WAM RED BLOOD CELL COUNT.: 3.83 M/ÂµL — ABNORMAL LOW (ref 4.00–6.00)
BKR WAM WHITE BLOOD CELL COUNT: 2.6 x1000/ÂµL — ABNORMAL LOW (ref 4.0–11.0)
BKR WAM WHITE BLOOD CELL COUNT: 4 x1000/ÂµL (ref 4.0–11.0)

## 2023-06-10 LAB — LIVER FUNCTION TESTS     (YH)
BKR ALANINE AMINOTRANSFERASE (ALT): 16 U/L (ref 9–59)
BKR ALKALINE PHOSPHATASE: 72 U/L (ref 9–122)
BKR ASPARTATE AMINOTRANSFERASE (AST): 28 U/L (ref 10–35)
BKR AST/ALT RATIO: 1.8
BKR BILIRUBIN DIRECT: 0.1 mg/dL (ref <=0.2)
BKR BILIRUBIN TOTAL: 0.2 mg/dL (ref <=1.2)

## 2023-06-10 LAB — BASIC METABOLIC PANEL
BKR ANION GAP: 17 (ref 7–17)
BKR BLOOD UREA NITROGEN: 53 mg/dL — ABNORMAL HIGH (ref 8–23)
BKR BUN / CREAT RATIO: 15.6 (ref 8.0–23.0)
BKR CALCIUM: 8 mg/dL — ABNORMAL LOW (ref 8.8–10.2)
BKR CHLORIDE: 106 mmol/L (ref 98–107)
BKR CO2: 15 mmol/L — ABNORMAL LOW (ref 20–30)
BKR CREATININE DELTA: -0.3
BKR CREATININE: 3.4 mg/dL — ABNORMAL HIGH (ref 0.40–1.30)
BKR EGFR, CREATININE (CKD-EPI 2021): 16 mL/min/{1.73_m2} — ABNORMAL LOW (ref >=60)
BKR GLUCOSE: 104 mg/dL — ABNORMAL HIGH (ref 70–100)
BKR POTASSIUM: 4.7 mmol/L (ref 3.3–5.3)
BKR SODIUM: 138 mmol/L (ref 136–144)

## 2023-06-10 LAB — VANCOMYCIN, RANDOM     (BH GH LMW YH): BKR VANCOMYCIN RANDOM: 11.5 ug/mL

## 2023-06-10 LAB — PROCALCITONIN     (BH GH LMW Q YH): BKR PROCALCITONIN: 0.11 ng/mL

## 2023-06-10 LAB — C-REACTIVE PROTEIN     (CRP): BKR C-REACTIVE PROTEIN, HIGH SENSITIVITY: 7.6 mg/L — ABNORMAL HIGH

## 2023-06-10 LAB — D-DIMER, QUANTITATIVE: BKR D-DIMER: 4.96 mg{FEU}/L — ABNORMAL HIGH (ref <=0.95)

## 2023-06-10 MED ORDER — FUROSEMIDE 10 MG/ML INJECTION SOLUTION
10 | Freq: Once | INTRAVENOUS | Status: CP
Start: 2023-06-10 — End: ?
  Administered 2023-06-10: 15:00:00 10 mL via INTRAVENOUS

## 2023-06-10 MED ORDER — AMPICILLIN 1GM MBP
Freq: Three times a day (TID) | INTRAVENOUS | Status: DC
Start: 2023-06-10 — End: 2023-06-10

## 2023-06-10 MED ORDER — DEXTROMETHORPHAN-GUAIFENESIN 10 MG-100 MG/5 ML ORAL SYRUP
10-100 | ORAL | Status: DC | PRN
Start: 2023-06-10 — End: 2023-06-14
  Administered 2023-06-10 – 2023-06-14 (×8): 10-100 mL via ORAL

## 2023-06-10 MED ORDER — CEFTRIAXONE IV PUSH 1000 MG VIAL & NS (ADULTS)
INTRAVENOUS | Status: CP
Start: 2023-06-10 — End: ?
  Administered 2023-06-10 – 2023-06-11 (×2): 10.000 mL via INTRAVENOUS
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
## 2023-06-10 NOTE — Other
 ANTIMICROBIAL SURVEILLANCE NOTE: Positive Blood Culture ResultsCurrent Therapy: Antimicrobial #1: vancomycin IVAntimicrobial #2: piperacillin/tazobactamAntimicrobial #3: remdesivirIndication: COVID-19Pertinent Cultures:Culture #1: (4/11) Blood: GPC in clusters (1/4 bottles)PCR Result: S. aureus (-), MRSA (-) --> CoNS by rapid PCRAST Suggestions/Comments:The Gram positive cocci in clusters identified in the blood are negative for MSSA/MRSA, so it is coagulase negative staphylococci. The isolation of coagulase negative staphylococcus in 1/4 blood culture bottles with no significant risk factors (e.g. central lines, indwelling hardware/prosthetic material) would indicate likely contaminant.Therefore, would recommend discontinuing vancomycin at this time.Would also consider discontinuing pip/tazo as antipseudomonal coverage would not be indicated if treating an empiric UTI. No urinary symptoms reported in H&P. Chest imaging did not show pneumonia and respiratory symptoms can be explained by COVID-19.For any questions, please contact:  Kim Pen, PharmDPhone/Mobile Heartbeat/Pager:  MHB

## 2023-06-10 NOTE — Other
 I called Calvert Caul daughter and healthcare representative, Amalia Badder, to provide a non-urgent update about the medical team's plan of care. We discussed current plan of care, changes of antibiotics. All current questions answered.Electronically Signed By: Aundria Leech, MDPGY-3, [facility] Internal Medicine - Primary Care4/01/2024

## 2023-06-10 NOTE — Progress Notes
 Inpatient Medicine - Progress NoteHospital day: 1Overnight: NAEO Today: - reports feeling well, states his breathing is better than ever - denies abdominal pain.  Reports good appetite.ObjectiveBP (!) 156/73  - Pulse 67  - Temp 97.6 ?F (36.4 ?C) (Oral)  - Resp 18  - Ht 5' 11 (1.803 m)  - Wt 81.6 kg  - SpO2 98%  - BMI 25.10 kg/m?  EXAM:Const: well nourished, comfortable appearing lying in bed, in NADCV: RRR, nl S1S2, no RMGPulm: CTAB anterior lung fields, breathing comfortably on RAAbd: active bowel sounds, soft NTNDNeuro:  Grossly oriented answering questions appropriatelyLabs notable forRecent Labs Lab 04/11/251154 04/12/250549 WBC 4.4 2.6* HGB 9.3* 7.9* HCT 29.40* 24.90* MCV 70.8* 69.7* PLT 226 193 Recent Labs Lab 04/11/251154 04/12/250028 04/12/250549 04/12/250611 04/12/251222 NA 137  --  138  --   --  K 4.8  --  4.7  --   --  CL 104  --  106  --   --  CO2 18*  --  15*  --   --  BUN 52*  --  53*  --   --  CREATININE 3.70*  --  3.40*  --   --  GLU 192*   < > 104*   < > 145* ANIONGAP 15  --  17  --   --  CALCIUM 8.7*  --  8.0*  --   --  MG 2.1  --   --   --   --   < > = values in this interval not displayed. Recent Labs Lab 04/11/251154 04/12/250549 ALBUMIN 3.6  --  AST 36* 28 ALT 20 16 ALKPHOS 89 72 BILITOT 0.4 0.2 Recent Labs Lab 04/11/251154 LABPROT 11.4 INR 1.06 PTT 25.4 Recent Labs Lab 04/11/251154 04/12/250028 04/12/250549 04/12/250611 04/12/251222 GLU 192* 126* 104* 96 145* Micro 1/4 blood cultures Gram-positive cocci in clusters negative for MSSA/MRSAImaging No new imaging Assessment & PlanPatient is a 89 yrs old male,  has a past medical history of HFpEF, CAD, HTN, Hard if hearing, BPH/urinary retention with chronic indwelling foley, orthostatic hypotension was brought to the ED due to cough, shortness for breath, diarrhea, vomiting found to be covid positive. Today he reports feeling well COVID - patient required BiPAP in ED, currently on room air without respiratory distress.- CTA negative for PE or acute abnormality- continue Dexamethasone Remdesevir.  UTI/AKI on CKDBPH/urinary retention with chronic indwelling foleyBaseline creatinine 2.5- UA positive RBC, WBC, LE- Seagraves AP Chronic bilateral hydroureteronephrosis, with irregular wall thickening and foci of air within the anterior aspect of the bladder wall. Although this may be secondary to ensure mentation, underlying chronic emphysematous cystitis cannot be excluded.  - discontinued  Van/zosyn since Bcx looked like contaminant. - switched empiric abx coverage for UTI to ceftriaxone Anemia: - hgb drop from 9.3 -7.9, given cardiac risk factors patient received 1 unit of red blood cells- repeat CBC this eveningCAD, HTN- Continue ASA, Plavix, Statin Orthostatic hypotension - Midodrine PRNDiet:  Regular cardiacProphylaxis:  HeparinCode status:  Full codeBarriers to discharge:  Pending clinicalDiscussed with attending. Await addendum. Signed: Josefina Nian, MDPGY-3, Internal MedicineMHB4/01/2024

## 2023-06-10 NOTE — Telephone Encounter
 Copied from CRM #[id number]. Topic: Nursing Triage Note - [facility]>> Jun 10, 2023  8:38 AM [name] wrote:[facility]:  NURSE TRIAGE MESSAGETime of call:   8:38 AMCaller:   Dombrosky, JAMESReason for call:  Spoke to daughter Amalia Badder about 06/19/23 3 PM apt with Dr Glenda Landing she is wondering if there are any morning apts since her Dad is better in the morning. Can she be called at [phone].[facility] Referral Specialist

## 2023-06-10 NOTE — Plan of Care
 Mark [name]					Location: [phone] y.o., male				Attending: Flavio Huguenin, MD	Admit Date: 06/09/2023			[id number] LOS: 1 day Initial Nutrition AssessmentNutrition Recommendations:Liberalize diet to regular given ageRecommend Ensure plus BID to supplement kcal intake (each 8 oz ons provides 350 kcal, 13 g protein)Recommend multivitamin with folic acid given wt lossEncourage adequate kcal and fluid intake on the floor as you areReason For Assessment: skin integrity, nurse/nurse practitioner consultHPI: Admitted 06/09/2023 r/t COVID - 19Related Health history: 89 y.o.male. Relevant PMH per chart review: HFpEF, CAD, HTN, CKD 4, GERD, HLD, HTN recent admissionsSubjective: Consulted given presence of wound. Tolerating diet and passed bedside swallow per RN ED note 4/11. No meal intakes recorded yet. Given altered skin integrity, recommend ONS to help optimize intake. Significant wt loss noted in the last 3 months; appears pt has been down trending in wt within the last year. At risk for malnutrition, does not meet criteria at this time.Current Diet Order: Diet CardiacI/Os: LBM 06/10/23 in stool documentation Intake/Output Summary (Last 24 hours) at 06/10/2023 1012Last data filed at 06/10/2023 0351Gross per 24 hour Intake 1410.48 ml Output 450 ml Net 960.48 ml Diet History: Cardiac since admissionFood Allergies: Allergies Allergen Reactions  Ace Inhibitors Cough Cultural/Religious/Ethnic Needs: none knownSkin: Braden score = 15. Stage 2 to coccyxMedications: dexamethasone, pepcid, rosuvastatin, senna, sodium bicarb 650 mg TID, vancomycin, others reviewed. Labs: Recent Labs   03/16/250632 03/17/250612 03/18/250512 03/19/250513 03/26/250922 04/11/251154 04/12/250549 NA 140   < > 139 137 136 137 138 K 4.5   < > 4.6 4.5 4.5 4.8 4.7 PHOS 3.7  --   --   --   --   --   --  MG 1.9   < >  --   --   --  2.1  -- CREATININE 3.53*   < > 3.30* 3.34* 3.70* 3.70* 3.40* BUN 38*   < > 33* 33* 59* 52* 53*  < > = values in this interval not displayed. 4/12 Glu 104 H3/14/25 vit D 18 LA1c= 6.4% (12/18/22; prediabetic range, WNL given age)Anthropometrics:Height: 5' 11 (1.803 m)Current weight: 81.6 kgBMI: Body mass index is 25.1 kg/m?Mark AasWeight History: 7.6 kg, 8.5% wt loss in the last 3 months15.9 kg, 16.3% wt loss in the last yearWt Readings from Last 20 Encounters: 06/10/23 81.6 kg 06/07/23 84.2 kg 05/29/23 82.7 kg 05/12/23 84 kg 03/22/23 89.2 kg 03/17/23 89.8 kg 03/14/23 88.1 kg 03/13/23 88.3 kg 02/24/23 86.4 kg 11/17/22 96.6 kg 10/19/22 96.2 kg 10/14/22 96.2 kg 10/06/22 98.9 kg 09/15/22 96.5 kg 07/11/22 97.5 kg 04/22/22 96.2 kg 04/20/22 96.2 kg 04/07/22 96.2 kg 01/07/22 96.2 kg 07/01/21 100 kg Physical findings: unable to assess; pt on special respiratory precautions, will defer to when ableESTIMATED NUTRITION REQUIREMENTS: Assessed on 06/10/2023 Kcal/day: 2040 kcal		(25 kcal/kg)Protein/day: 102-122 grams	(1.25-1.5  gm/kg) * monitor renal functionFluids/day: Fluid management per medical team discretion. 2040 ml (47mL/kcal) Needs based on: Admission wt of 81.6 kg. Age, BMI, wt loss, AKI on CKD 4, skin integrity considered. Nutrition Diagnosis:	Nutrition Diagnosis/Problem: Increased nutrient needs	Related to: increased metabolic demand of wound healing	As Evidenced by: stage 2 PIComments: newInterventions:Nutrition Prescription: regular diet; ensure plus BIDPlan: Meal and Snacks: General/Healthful dietMedical Food Supplements: Commercial beverageVitamin and Mineral Supplements: Multivitamin/mineral, FolateFeeding Assistance:  (encouragement)Coordination of other care during Nutrition care: Collaboration/referral to other providersD/C & transfer of care to new setting or provider: Collaborative referal to other providers (anticipate regular diet pending hospital course) Goals: Pt to meet >75% estimated energy needs by next nutritional f/u Pt to consume the equivalent to one or more nutritional supplement daily Monitoring/Evaluation:Food/Nutrition-Related Outcomes:  food and nutrient intake/administration.Anthropometric Outcomes:            Height, weight, BMI, Weight historyBiochemical Data, Medical Tests, and Procedure Outcomes:            Labs (glucose, BMP)Nutrition-Focused Physical Finding Outcomes:            Physical appearance, muscle and fat wasting, swallow function, appetite, skin integrity, GI function.RDN following per [facility] Clinical Nutrition Standards of Care.Registered Dietitian Nutritionist: Electronically signed by: Lenda Quan MS, RD, CNSC 4/12/2025Please contact via MHB with questions: [phone]

## 2023-06-10 NOTE — Plan of Care
 Plan of Care Overview/ Patient StatusPatient resting in bed, medications given per [place]. A&Ox4, forgeful, on room air, lung sounds diminished. Incontinent of stool this am, at baseline knows when he needs to move his bowels, chronic foley intact, catheter cares provided. Skin with stage II to coccyx, barrier cream applied. IV clean dry intact to LUE. Safety maintained, bed alarm on, call light and items within reach, bed in lowest position.1800Resting in bed, patient denies any new complaints. Had a few loose Bms, received miralax and senna this morning, was continent on bedpan. Has two PIVS, second IV placed by IV team today. Received 1 unit pRBCs this afternoon, tolerated well, received lasix prior to blood transfusion. Safety maintained, bed alarm on, call light and items within reach, bed in lowest position.Problem: Violence Risk or ActualGoal: Anger and Impulse ControlOutcome: Interventions implemented as appropriate Problem: Adult Inpatient Plan of CareGoal: Plan of Care ReviewOutcome: Interventions implemented as appropriateGoal: Patient-Specific Goal (Individualized)Outcome: Interventions implemented as appropriateGoal: Absence of Hospital-Acquired Illness or InjuryOutcome: Interventions implemented as appropriateGoal: Optimal Comfort and WellbeingOutcome: Interventions implemented as appropriateGoal: Readiness for Transition of CareOutcome: Interventions implemented as appropriate Problem: InfectionGoal: Absence of Infection Signs and SymptomsOutcome: Interventions implemented as appropriate Problem: WoundGoal: Optimal CopingOutcome: Interventions implemented as appropriateGoal: Optimal Functional AbilityOutcome: Interventions implemented as appropriateGoal: Absence of Infection Signs and SymptomsOutcome: Interventions implemented as appropriateGoal: Improved Oral IntakeOutcome: Interventions implemented as appropriateGoal: Optimal Pain Control and FunctionOutcome: Interventions implemented as appropriateGoal: Skin Health and IntegrityOutcome: Interventions implemented as appropriateGoal: Optimal Wound HealingOutcome: Interventions implemented as appropriate Problem: Fall Injury RiskGoal: Absence of Fall and Fall-Related InjuryOutcome: Interventions implemented as appropriate

## 2023-06-11 LAB — BASIC METABOLIC PANEL
BKR ANION GAP: 15 (ref 7–17)
BKR BLOOD UREA NITROGEN: 55 mg/dL — ABNORMAL HIGH (ref 8–23)
BKR BUN / CREAT RATIO: 16.2 (ref 8.0–23.0)
BKR CALCIUM: 8.2 mg/dL — ABNORMAL LOW (ref 8.8–10.2)
BKR CHLORIDE: 104 mmol/L (ref 98–107)
BKR CO2: 19 mmol/L — ABNORMAL LOW (ref 20–30)
BKR CREATININE DELTA: 0
BKR CREATININE: 3.4 mg/dL — ABNORMAL HIGH (ref 0.40–1.30)
BKR EGFR, CREATININE (CKD-EPI 2021): 16 mL/min/{1.73_m2} — ABNORMAL LOW (ref >=60)
BKR GLUCOSE: 110 mg/dL — ABNORMAL HIGH (ref 70–100)
BKR POTASSIUM: 4.2 mmol/L (ref 3.3–5.3)
BKR SODIUM: 138 mmol/L (ref 136–144)

## 2023-06-11 LAB — CBC WITH AUTO DIFFERENTIAL
BKR WAM ABSOLUTE IMMATURE GRANULOCYTES.: 0.01 x 1000/ÂµL (ref 0.00–0.30)
BKR WAM ABSOLUTE LYMPHOCYTE COUNT.: 1.38 x 1000/ÂµL (ref 0.60–3.70)
BKR WAM ABSOLUTE NRBC (2 DEC): 0 x 1000/ÂµL (ref 0.00–1.00)
BKR WAM ANC (ABSOLUTE NEUTROPHIL COUNT): 2.9 x 1000/ÂµL (ref 2.00–7.60)
BKR WAM BASOPHIL ABSOLUTE COUNT.: 0.02 x 1000/ÂµL (ref 0.00–1.00)
BKR WAM BASOPHILS: 0.4 % (ref 0.0–1.4)
BKR WAM EOSINOPHIL ABSOLUTE COUNT.: 0.08 x 1000/ÂµL (ref 0.00–1.00)
BKR WAM EOSINOPHILS: 1.6 % (ref 0.0–5.0)
BKR WAM HEMATOCRIT (2 DEC): 28.9 % — ABNORMAL LOW (ref 38.50–50.00)
BKR WAM HEMOGLOBIN: 9.1 g/dL — ABNORMAL LOW (ref 13.2–17.1)
BKR WAM IMMATURE GRANULOCYTES: 0.2 % (ref 0.0–1.0)
BKR WAM LYMPHOCYTES: 27.3 % (ref 17.0–50.0)
BKR WAM MCH (PG): 23.2 pg — ABNORMAL LOW (ref 27.0–33.0)
BKR WAM MCHC: 31.5 g/dL (ref 31.0–36.0)
BKR WAM MCV: 73.7 fL — ABNORMAL LOW (ref 80.0–100.0)
BKR WAM MONOCYTE ABSOLUTE COUNT.: 0.67 x 1000/ÂµL (ref 0.00–1.00)
BKR WAM MONOCYTES: 13.2 % — ABNORMAL HIGH (ref 4.0–12.0)
BKR WAM MPV: 9.9 fL (ref 8.0–12.0)
BKR WAM NEUTROPHILS: 57.3 % (ref 39.0–72.0)
BKR WAM NUCLEATED RED BLOOD CELLS: 0 % (ref 0.0–1.0)
BKR WAM PLATELETS: 183 x1000/ÂµL (ref 150–420)
BKR WAM RDW-CV: 17.4 % — ABNORMAL HIGH (ref 11.0–15.0)
BKR WAM RED BLOOD CELL COUNT.: 3.92 M/ÂµL — ABNORMAL LOW (ref 4.00–6.00)
BKR WAM WHITE BLOOD CELL COUNT: 5.1 x1000/ÂµL (ref 4.0–11.0)

## 2023-06-11 LAB — LIVER FUNCTION TESTS     (YH)
BKR ALANINE AMINOTRANSFERASE (ALT): 17 U/L (ref 9–59)
BKR ALKALINE PHOSPHATASE: 74 U/L (ref 9–122)
BKR ASPARTATE AMINOTRANSFERASE (AST): 31 U/L (ref 10–35)
BKR AST/ALT RATIO: 1.8
BKR BILIRUBIN DIRECT: 0.1 mg/dL (ref <=0.2)
BKR BILIRUBIN TOTAL: 0.3 mg/dL (ref <=1.2)

## 2023-06-11 LAB — PROCALCITONIN     (BH GH LMW Q YH): BKR PROCALCITONIN: 0.11 ng/mL

## 2023-06-11 LAB — C-REACTIVE PROTEIN     (CRP): BKR C-REACTIVE PROTEIN, HIGH SENSITIVITY: 6.2 mg/L — ABNORMAL HIGH

## 2023-06-11 LAB — D-DIMER, QUANTITATIVE: BKR D-DIMER: 4.67 mg{FEU}/L — ABNORMAL HIGH (ref <=0.96)

## 2023-06-11 LAB — MAGNESIUM: BKR MAGNESIUM: 1.9 mg/dL (ref 1.7–2.4)

## 2023-06-11 LAB — PHOSPHORUS     (BH GH L LMW YH): BKR PHOSPHORUS: 3.8 mg/dL (ref 2.2–4.5)

## 2023-06-11 MED ORDER — HEPARIN (PORCINE) 5,000 UNIT/ML INJECTION SOLUTION
5000 | Freq: Three times a day (TID) | SUBCUTANEOUS | Status: DC
Start: 2023-06-11 — End: 2023-06-14
  Administered 2023-06-11 – 2023-06-14 (×9): 5000 mL via SUBCUTANEOUS

## 2023-06-11 MED ORDER — DEXAMETHASONE 6 MG TABLET
6 | ORAL | Status: DC
Start: 2023-06-11 — End: 2023-06-14
  Administered 2023-06-11 – 2023-06-13 (×3): 6 mg via ORAL

## 2023-06-11 MED ORDER — ENOXAPARIN 40 MG/0.4 ML SUBCUTANEOUS SYRINGE
40 | Freq: Two times a day (BID) | SUBCUTANEOUS | Status: DC
Start: 2023-06-11 — End: 2023-06-11

## 2023-06-11 MED ORDER — CEFUROXIME AXETIL 500 MG TABLET
500 | Freq: Two times a day (BID) | ORAL | Status: DC
Start: 2023-06-11 — End: 2023-06-12
  Administered 2023-06-12: 09:00:00 500 mg via ORAL

## 2023-06-11 MED ORDER — CEFUROXIME AXETIL 500 MG TABLET
500 | Freq: Two times a day (BID) | ORAL | Status: DC
Start: 2023-06-11 — End: 2023-06-11

## 2023-06-11 NOTE — Plan of Care
 Plan of Care Overview/ Patient StatusPt admitted to unit at 0000. All belongings secured on unit. Pt alert and oriented x3, disoriented to situation. Anxious at times. Pt hard of hearing, hearing aides at bedside. Pt tachypneic, DOE. Foley in place. Skin check perfomed. Pt with stage 2 to coccyx, foam drsg placed. Frequent repos. Safety precuations. Bed alarmed. Problem: Adult Inpatient Plan of CareGoal: Plan of Care ReviewOutcome: Interventions implemented as appropriateGoal: Optimal Comfort and WellbeingOutcome: Interventions implemented as appropriate Problem: Fall Injury RiskGoal: Absence of Fall and Fall-Related InjuryOutcome: Interventions implemented as appropriate

## 2023-06-11 NOTE — Plan of Care
 Plan of Care Overview/ Patient StatusPt alert and oriented x3, disoriented to situation. Anxious at times. Pt hard of hearing, hearing aides at bedside. Pt dyspneic on exertion. On tele, NSR. Foley in place. Skin check perfomed. Pt with stage 2 to coccyx, foam drsg placed. Occasionally incontinent of stool. Frequent repos. Safety precuations. Bed alarmed. Problem: Adult Inpatient Plan of CareGoal: Plan of Care ReviewOutcome: Interventions implemented as appropriateGoal: Optimal Comfort and WellbeingOutcome: Interventions implemented as appropriate Problem: Fall Injury RiskGoal: Absence of Fall and Fall-Related InjuryOutcome: Interventions implemented as appropriate

## 2023-06-11 NOTE — Plan of Care
 Plan of Care Overview/ Patient StatusPatient resting in bed, medications given per [facility]. A&Ox4, forgetful, on room air, lung sounds diminished, occasional dry nonproductive cough. Mixed continence, had pasty loose BM this morning, held senna and miralax. Skin with stage II to sacral area, barrier cream applied. At baseline ambulates with assistance with RW. Safety maintained, bed alarm on, call light and items within reach, bed in lowest position. Special respiratory precautions maintained.1800Resting in bed, attempted oxygen walk test around 1400, patient unsteady and DOE, was able to walk a short distance to chair across room but was very fatigued and desaturated to 84%. Took approximately one minute to recover and was 98% at rest on room air. Sat up in recliner after ambulating. Ax1-2 with RW. Daughters came to bedside to visit patient, stated they are working on getting setup at home for patient to return tomorrow including setting up his bed. Safety maintained, bed alarm on, call light and items within reach, bed in lowest position. Special respiratory precautions maintained.Problem: Violence Risk or ActualGoal: Anger and Impulse ControlOutcome: Interventions implemented as appropriate Problem: Adult Inpatient Plan of CareGoal: Plan of Care ReviewOutcome: Interventions implemented as appropriateGoal: Patient-Specific Goal (Individualized)Outcome: Interventions implemented as appropriateGoal: Absence of Hospital-Acquired Illness or InjuryOutcome: Interventions implemented as appropriateGoal: Optimal Comfort and WellbeingOutcome: Interventions implemented as appropriateGoal: Readiness for Transition of CareOutcome: Interventions implemented as appropriate Problem: InfectionGoal: Absence of Infection Signs and SymptomsOutcome: Interventions implemented as appropriate Problem: WoundGoal: Optimal CopingOutcome: Interventions implemented as appropriateGoal: Optimal Functional AbilityOutcome: Interventions implemented as appropriateGoal: Absence of Infection Signs and SymptomsOutcome: Interventions implemented as appropriateGoal: Improved Oral IntakeOutcome: Interventions implemented as appropriateGoal: Optimal Pain Control and FunctionOutcome: Interventions implemented as appropriateGoal: Skin Health and IntegrityOutcome: Interventions implemented as appropriateGoal: Optimal Wound HealingOutcome: Interventions implemented as appropriate Problem: Fall Injury RiskGoal: Absence of Fall and Fall-Related InjuryOutcome: Interventions implemented as appropriate Problem: Skin Injury Risk IncreasedGoal: Skin Health and IntegrityOutcome: Interventions implemented as appropriate

## 2023-06-11 NOTE — Other
 Called daughter Amalia Badder and provided update, answered questions

## 2023-06-11 NOTE — Progress Notes
 [facility] Progress NoteHospital Medicine ServiceAttending Provider: Sherlynn Stalls, MD  Location:  [phone] Day #2Subjective   Patient seen and examined on 06/11/2023.CC: COVID-19 virus infectionInterim History: Feeling well, wants to go home to see daughter  Objective  Vitals:Temp:  [97.4 ?F (36.3 ?C)-98.3 ?F (36.8 ?C)] 97.4 ?F (36.3 ?C)Pulse:  [59-76] 59Resp:  [16-19] 19BP: (120-163)/(58-80) 138/66SpO2:  [96 %-99 %] 97 %Device (Oxygen Therapy): room airI/O's:Intake/Output Summary (Last 24 hours) at 06/11/2023 1234Last data filed at 06/10/2023 1849Gross per 24 hour Intake 1459.01 ml Output 1550 ml Net -90.99 ml  Physical ExamVitals and nursing note reviewed. Constitutional:     Appearance: Normal appearance. Cardiovascular:    Rate and Rhythm: Normal rate and regular rhythm.    Heart sounds: Normal heart sounds. Pulmonary:    Effort: Pulmonary effort is normal. No respiratory distress.    Breath sounds: Normal breath sounds. No wheezing. Musculoskeletal:    Right lower leg: No edema.    Left lower leg: No edema. Skin:   General: Skin is warm and dry. Neurological:    General: No focal deficit present.    Mental Status: He is alert and oriented to person, place, and time.  Labs:I have reviewed the patient's pertinent labs as resulted in the EMR.Peripheral Access:Periph IV 06/09/23 dorsum left arm over-the-needle catheter system 20 gauge Field/EMS (Active)   Periph IV 06/10/23 1127 cephalic (thumb side), right over-the-needle catheter system 20 gauge 1 in length IV Team (Active)  Foley Catheter:UreTHral Catheter 06/09/23 1216 16 Registered Nurse 10 10 (Active) Foley order placed.Imaging:No results found. I reviewed lab results, microbiology, imaging, and test results in formulating this patient's plan of care.   Assessment: 89 y.o. male PMHx HFpEF, CAD, HTN, Hard if hearing, BPH/urinary retention with chronic indwelling foley, orthostatic hypotension was brought to the ED due to cough, shortness for breath, diarrhea, vomiting found to be covid positive. Also with AKI  Plan: Covid 19- required BiPAP in the ED, now breathing and oxygenating well on RA- CTA negative for PE- dexamethasone x7d total, remdesivir x7d total. Can d/c on dischargeAKI, UTI, BPH with urinary retention requiring chronic indwelling foley- ucx with klebsiella sensitive to ctx, received 2d of ctx. Transition to x1d of cefuroxime- Cr lateral from yesterday. Cr has been >3 each time it has been measured since march, but b/l was around 2/5 in January this year. Last seen by nephrology in March during hospitalization with similar Cr, where he was discharged with similar Cr and scheduled follow up. Appointment in clinic with nephrology on 4/21.- bcx likely contaminantAnemia- s/p 1u pRBC on 4/12- Hb stable this morningOther- CAD: asa, plavix- HLD: crestor- orthostatic hypotension: midodrine prn  Comorbidities Comorbidities present on admission:Chronic Heart Failure with preserved ejection fraction, last EF: 65% 05/10/2023.   PMH of COVID  Secondary diagnoses occurring during hospitalization:Hypocalcemia Diet: Nutrition SupplementsDiet RegularVTE PPx:  heparin (PORCINE) Medication Reconciliation: Complete Communication: Family: daughter, PMD, and RNDischarge Readiness:   AM-PAC (RN/PT): 13 /     Expected discharge location: HomeBarrier(s) to discharge: medical Full CodeMedical decision making for this patient was moderate., I spent 35 minutes today on this encounter before, during and after the visit, reviewing labs and records, evaluating and examining the patient, entering orders and documenting the visit. Signed:Sunday Klos, PA-C4/13/202512:34 PM

## 2023-06-11 NOTE — Plan of Care
 Problem: Adult Inpatient Plan of CareGoal: Readiness for Transition of CareOutcome: Interventions implemented as appropriate Plan of Care Overview/ Patient Status34yo male - readmitted  with COVID19 virus infection- PMHx - HTN; CAD; CHF; BPH ; Hard of HearingCM spoke to daughter via phone - [address] [place] [address] (has 2 stair lifts) - with daughter. 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 Paient has [facility] (OT/PT;SN;HHA) - DME - shower bench ; BS Commode; Rollator; RW; Walk in Shower. Hx with STR Latanya Maudlin). Daughter transport home on dc.CM sent referral for resumption of care to [facility]. Awaiting response.Area on Aging information given to daughter to inquire regarding available services/resources. Assessment screening completed. Continue to follow patient's progress and discuss plan of care with treatment team.  Discharge needs not determined at this time. Care Management will continue to follow with team.Case Management/Social Work Screening and Evaluation  Flowsheet Row Most Recent Value Case Management/Social Work Screening: Chart review completed. If YES to any question below then proceed to Eval/Plan  Is there a change in their cognitive function Yes Do you anticipate that the patient will have any discharge needs requiring CM/SW intervention? Yes Has there been an unscheduled readmission within the last 30 days and/or four (4) encounters (encounters include: ED, OBS, Inpatient) within the last six (6) months? Yes Were there services prior to admission ( Examples: Acute [facility],  Assisted Living, HD, Homecare, Extended Care Facility, Methadone, SNF, Outpatient Infusion Center) Yes  [facility] Home care - referral sent for resumption of services] Concern for current abuse/neglect/interpersonal violence/sexual assault  No Connected to state agency? No Guardianship or conservatorship No Negative/Positive Screen Positive Screening: Complete CM/SW Evaluation and Plan Case Manager/Social Work Patent attorney  I have reviewed the medical record and completed the above screen. CM/SW staff will follow patient's progress and discuss the plan of care with the Treatment Team. Yes Case Management/Social Work Evaluation and Plan  Arrived from prior to admission home/apartment/condo Admitted from: Home Do you have a caregiver, or do you anticipate the need for a caregiver given the change in your physical function? No Lives with Daughter, Adult Services Prior to Admission home care agency (specify services) Home care agency name: [employer] per daughter Home Care Agency Services:  OT, PT, HHA, RN visit Patient Requires Transition of Care Intervention Due To Discharge planning needs/concerns, Ability to make medical decisions in question Prior to Hospitalization: Assistance Needed/DME being used Ambulation, Bathing, Toileting Ambulation Assistance/DME: Rollator, Rolling walker, Lift device Bathing Assistance/DME: bed/bath same level, shower chair Toileting Assistance/DME: commode Documented Insurance Accurate Yes Any financial concerns related to anticipated discharge needs No Patient's home address verified Yes Patient's PCP of record verified Yes Last Date Seen by PCP 0-3 months Source of Clinical History  Patient's clinical history has been reviewed and source of Information is: Daughter, Administrator Record, Medical Provider Discharge Planning Coordination Recommendations  Discharge Planning Coordination Recommendations Home with Homecare Services Case Manager/Social Worker reviewed plan of care/ continuum of care need's with  Family, Interdisciplinary Team, Discharge Planning Rounds Housing / Transportation/ Environment  In the past 12 months has the electric, gas, oil, or water company threatened to shut off services in your home? No Within the past 12 months, you worried that your food would run out before you got the money to buy more. Never true Within the past 12 months, the food you bought just didn't last and you didn't have money to get more.  Never true In the past 12 months, has lack of transportation kept you from medical appointments or from getting medications? no In the past 12 months, has lack of transportation kept you from meetings, work, or from getting things needed for daily living? No Abuse Screen (yes response referral indicated)  Able to respond to abuse questions No Physical Indicators of Abuse No evidence of physical abuse  Yancey Helena, MSN, RN, [facility] ManagerYNHH Case Management Department

## 2023-06-12 ENCOUNTER — Inpatient Hospital Stay: Admit: 2023-06-12 | Payer: Medicare (Managed Care)

## 2023-06-12 DIAGNOSIS — R531 Weakness: Secondary | ICD-10-CM

## 2023-06-12 LAB — C. DIFFICILE ASSAY: BKR C. DIFFICILE TOXIN B DNA PCR: NEGATIVE

## 2023-06-12 LAB — BASIC METABOLIC PANEL
BKR ANION GAP: 11 (ref 7–17)
BKR BLOOD UREA NITROGEN: 53 mg/dL — ABNORMAL HIGH (ref 8–23)
BKR BUN / CREAT RATIO: 17.7 (ref 8.0–23.0)
BKR CALCIUM: 7.9 mg/dL — ABNORMAL LOW (ref 8.8–10.2)
BKR CHLORIDE: 106 mmol/L (ref 98–107)
BKR CO2: 21 mmol/L (ref 20–30)
BKR CREATININE DELTA: -0.4
BKR CREATININE: 3 mg/dL — ABNORMAL HIGH (ref 0.40–1.30)
BKR EGFR, CREATININE (CKD-EPI 2021): 18 mL/min/{1.73_m2} — ABNORMAL LOW (ref >=60)
BKR GLUCOSE: 217 mg/dL — ABNORMAL HIGH (ref 70–100)
BKR POTASSIUM: 5 mmol/L (ref 3.3–5.3)
BKR SODIUM: 138 mmol/L (ref 136–144)

## 2023-06-12 LAB — CBC WITH AUTO DIFFERENTIAL
BKR WAM ABSOLUTE IMMATURE GRANULOCYTES.: 0.01 x 1000/ÂµL (ref 0.00–0.30)
BKR WAM ABSOLUTE LYMPHOCYTE COUNT.: 0.75 x 1000/ÂµL (ref 0.60–3.70)
BKR WAM ABSOLUTE NRBC (2 DEC): 0 x 1000/ÂµL (ref 0.00–1.00)
BKR WAM ANC (ABSOLUTE NEUTROPHIL COUNT): 3.71 x 1000/ÂµL (ref 2.00–7.60)
BKR WAM BASOPHIL ABSOLUTE COUNT.: 0 x 1000/ÂµL (ref 0.00–1.00)
BKR WAM BASOPHILS: 0 % (ref 0.0–1.4)
BKR WAM EOSINOPHIL ABSOLUTE COUNT.: 0 x 1000/ÂµL (ref 0.00–1.00)
BKR WAM EOSINOPHILS: 0 % (ref 0.0–5.0)
BKR WAM HEMATOCRIT (2 DEC): 28.8 % — ABNORMAL LOW (ref 38.50–50.00)
BKR WAM HEMOGLOBIN: 8.9 g/dL — ABNORMAL LOW (ref 13.2–17.1)
BKR WAM IMMATURE GRANULOCYTES: 0.2 % (ref 0.0–1.0)
BKR WAM LYMPHOCYTES: 15.5 % — ABNORMAL LOW (ref 17.0–50.0)
BKR WAM MCH (PG): 22.7 pg — ABNORMAL LOW (ref 27.0–33.0)
BKR WAM MCHC: 30.9 g/dL — ABNORMAL LOW (ref 31.0–36.0)
BKR WAM MCV: 73.5 fL — ABNORMAL LOW (ref 80.0–100.0)
BKR WAM MONOCYTE ABSOLUTE COUNT.: 0.38 x 1000/ÂµL (ref 0.00–1.00)
BKR WAM MONOCYTES: 7.8 % (ref 4.0–12.0)
BKR WAM MPV: 10 fL (ref 8.0–12.0)
BKR WAM NEUTROPHILS: 76.5 % — ABNORMAL HIGH (ref 39.0–72.0)
BKR WAM NUCLEATED RED BLOOD CELLS: 0 % (ref 0.0–1.0)
BKR WAM PLATELETS: 176 x1000/ÂµL (ref 150–420)
BKR WAM RDW-CV: 17.8 % — ABNORMAL HIGH (ref 11.0–15.0)
BKR WAM RED BLOOD CELL COUNT.: 3.92 M/ÂµL — ABNORMAL LOW (ref 4.00–6.00)
BKR WAM WHITE BLOOD CELL COUNT: 4.9 x1000/ÂµL (ref 4.0–11.0)

## 2023-06-12 LAB — PHOSPHORUS     (BH GH L LMW YH): BKR PHOSPHORUS: 2.8 mg/dL (ref 2.2–4.5)

## 2023-06-12 LAB — LIVER FUNCTION TESTS     (YH)
BKR ALANINE AMINOTRANSFERASE (ALT): 20 U/L (ref 9–59)
BKR ALKALINE PHOSPHATASE: 77 U/L (ref 9–122)
BKR ASPARTATE AMINOTRANSFERASE (AST): 33 U/L (ref 10–35)
BKR AST/ALT RATIO: 1.7
BKR BILIRUBIN DIRECT: 0.1 mg/dL (ref <=0.2)
BKR BILIRUBIN TOTAL: 0.2 mg/dL (ref <=1.2)

## 2023-06-12 LAB — URINE CULTURE: BKR URINE CULTURE, ROUTINE: >=100000 — AB

## 2023-06-12 LAB — MAGNESIUM: BKR MAGNESIUM: 1.9 mg/dL (ref 1.7–2.4)

## 2023-06-12 LAB — PROCALCITONIN     (BH GH LMW Q YH): BKR PROCALCITONIN: 0.1 ng/mL

## 2023-06-12 LAB — C-REACTIVE PROTEIN     (CRP): BKR C-REACTIVE PROTEIN, HIGH SENSITIVITY: 4.8 mg/L — ABNORMAL HIGH

## 2023-06-12 LAB — D-DIMER, QUANTITATIVE: BKR D-DIMER: 3.74 mg{FEU}/L — ABNORMAL HIGH (ref <=0.96)

## 2023-06-12 NOTE — Telephone Encounter
 T/c placed to patient's daughter, she was ok with time, however, states that dad is currently in hospital and will be going to rehab.  Wanted to push appointment out a few weeks ot make sure he comes home from rehab.  Rescheduled appointment for 5/14 at 230.

## 2023-06-12 NOTE — Plan of Care
 Plan of Care Overview/ Patient StatusPatient resting in bed, medications given per [facility]. A&Ox4, on room air, lung sounds diminished. Mixed continence, having multiple loose Bms, had large amount of diarrhea less than one hour after receiving miralax and senna. MD notified, Cdiff sample ordered and sent to lab. Chronic foley catheter in place, draining yellow urine with sediment. Skin with stage II to sacral area, barrier cream applied. IV RUE clean dry intact. Walks with Ax1-2 with RW. Will attempt oxygen saturation today to assess need for oxygen when ambulating. Safety maintained, bed alarm on, call light and items within reach, bed in lowest position.1800Resting in bed. Ambulated to recliner with Ax2 and RW. Desaturated to 82% on room air, placed on 6L nasal cannula, oxygen saturation recovered after a few minutes and was able to be weaned to room air, saturating 99-100%. Md notified, new CXR obtained. Had one small episode of diarrhea on BSC. Safety maintained, bed alarm on, call light and items within reach, bed in lowest position.Problem: Violence Risk or ActualGoal: Anger and Impulse Control4/14/2025 1004 by Gevena Mart, RNOutcome: Interventions implemented as appropriate4/14/2025 0954 by Gevena Mart, RNOutcome: Interventions implemented as appropriate Problem: Adult Inpatient Plan of CareGoal: Plan of Care Review4/14/2025 1004 by Gevena Mart, RNOutcome: Interventions implemented as appropriate4/14/2025 0954 by Gevena Mart, RNOutcome: Interventions implemented as appropriateGoal: Patient-Specific Goal (Individualized)06/12/2023 1004 by Gevena Mart, RNOutcome: Interventions implemented as appropriate4/14/2025 0954 by Gevena Mart, RNOutcome: Interventions implemented as appropriateGoal: Absence of Hospital-Acquired Illness or Injury4/14/2025 1004 by Gevena Mart, RNOutcome: Interventions implemented as appropriate4/14/2025 0954 by Gevena Mart, RNOutcome: Interventions implemented as appropriateGoal: Optimal Comfort and Wellbeing4/14/2025 1004 by Gevena Mart, RNOutcome: Interventions implemented as appropriate4/14/2025 0954 by Gevena Mart, RNOutcome: Interventions implemented as appropriateGoal: Readiness for Transition of Care4/14/2025 1004 by Gevena Mart, RNOutcome: Interventions implemented as appropriate4/14/2025 0954 by Gevena Mart, RNOutcome: Interventions implemented as appropriate Problem: InfectionGoal: Absence of Infection Signs and Symptoms4/14/2025 1004 by Gevena Mart, RNOutcome: Interventions implemented as appropriate4/14/2025 0954 by Gevena Mart, RNOutcome: Interventions implemented as appropriate Problem: WoundGoal: Optimal Coping4/14/2025 1004 by Gevena Mart, RNOutcome: Interventions implemented as appropriate4/14/2025 0954 by Gevena Mart, RNOutcome: Interventions implemented as appropriateGoal: Optimal Functional Ability4/14/2025 1004 by Gevena Mart, RNOutcome: Interventions implemented as appropriate4/14/2025 1610 by Gevena Mart, RNOutcome: Interventions implemented as appropriateGoal: Absence of Infection Signs and Symptoms4/14/2025 1004 by Gevena Mart, RNOutcome: Interventions implemented as appropriate4/14/2025 0954 by Gevena Mart, RNOutcome: Interventions implemented as appropriateGoal: Improved Oral Intake4/14/2025 1004 by Gevena Mart, RNOutcome: Interventions implemented as appropriate4/14/2025 9604 by Gevena Mart, RNOutcome: Interventions implemented as appropriateGoal: Optimal Pain Control and Function4/14/2025 1004 by Gevena Mart, RNOutcome: Interventions implemented as appropriate4/14/2025 0954 by Gevena Mart, RNOutcome: Interventions implemented as appropriateGoal: Skin Health and Integrity4/14/2025 1004 by Gevena Mart, RNOutcome: Interventions implemented as appropriate4/14/2025 0954 by Gevena Mart, RNOutcome: Interventions implemented as appropriateGoal: Optimal Wound Healing4/14/2025 1004 by Gevena Mart, RNOutcome: Interventions implemented as appropriate4/14/2025 5409 by Gevena Mart, RNOutcome: Interventions implemented as appropriate Problem: Fall Injury RiskGoal: Absence of Fall and Fall-Related Injury4/14/2025 1004 by Gevena Mart, RNOutcome: Interventions implemented as appropriate4/14/2025 0954 by Gevena Mart, RNOutcome: Interventions implemented as appropriate Problem: Skin Injury Risk IncreasedGoal: Skin Health and Integrity4/14/2025 1004 by Gevena Mart, RNOutcome: Interventions implemented as appropriate4/14/2025 0954 by Gevena Mart, RNOutcome: Interventions implemented as appropriate

## 2023-06-12 NOTE — Plan of Care
 Plan of Care Overview/ Patient StatusProblem: Adult Inpatient Plan of CareGoal: Readiness for Transition of CareOutcome: Interventions implemented as appropriateCM spoke with patients daughter regarding discharge planning. Daughter states she will need advanced notice for discharge home as she has to arrange transportation with her son-in-law. She states she has multiple ambulance bills; is working to pay about 6 of them at 250.00 co-pay each and she would like to prevent getting another ambulance bill. She stated her son-in-law goes to work around 2pm and would prefer an early pick up time. She is currently in the process of putting together patient's hospital bed that she has purchased from a friend. CM informed her we will give advanced notice prior to discharge. She verbalized understanding. Addendum: Patient discussed in discharge planning rounds. Plan for discharge tomorrow, per provider pending oxygen saturation. Per unit RN, patient required 6L NC when obtaining sats on exertion. Provider aware. Will continue to monitor.  CM spoke with patient's daughter, Amalia Badder, who stated she at the bedside when patient noted to be SOB on exertion. We discussed possibility of STR. She is in agreement to [place], if needed. Referral sent to [facility]. Darina Edis, MSN, FNP-BC, Scientist, clinical (histocompatibility and immunogenetics) [facility]

## 2023-06-12 NOTE — Plan of Care
 Plan of Care Overview/ Patient StatusPt alert and oriented x3, disoriented to situation. Pt hard of hearing, hearing aides at bedside. Pt dyspneic on exertion. Non productive cough, given robitussin. On tele, NSR. Foley in place. Blood sugars q6. Pt with stage 2 to coccyx, foam drsg placed. Occasionally incontinent of stool. Frequent repos. Safety precuations. Bed alarmed. Problem: Adult Inpatient Plan of CareGoal: Plan of Care ReviewOutcome: Interventions implemented as appropriateGoal: Optimal Comfort and WellbeingOutcome: Interventions implemented as appropriate Problem: Fall Injury RiskGoal: Absence of Fall and Fall-Related InjuryOutcome: Interventions implemented as appropriate

## 2023-06-12 NOTE — Significant Event
 With activity became dyspneic and hypoxic this afternoon, on 6L briefly for recovery.  Will order repeat chest x-ray.Alferd Angers, MD, PhD04/14/25 3:40 PM

## 2023-06-12 NOTE — Progress Notes
 [name] Progress NoteHospital Medicine ServiceAttending Provider: Carollee Herter, MD  Location:  [phone] Day #3Subjective   Patient seen and examined on 06/12/2023.CC: COVID-19 virus infectionInterim History: - frequent BM, no nausea or abdominal pain, c.diff negative- no CP or dyspnea  Objective  Vitals:Temp:  [97.6 ?F (36.4 ?C)-98.2 ?F (36.8 ?C)] 97.6 ?F (36.4 ?C)Pulse:  [59-71] 59Resp:  [18] 18BP: (123-151)/(53-72) 151/67SpO2:  [95 %-100 %] 98 %Device (Oxygen Therapy): room airI/O's:Intake/Output Summary (Last 24 hours) at 06/12/2023 1141Last data filed at 06/12/2023 1000Gross per 24 hour Intake 489.16 ml Output 2100 ml Net -1610.84 ml  Physical ExamGen: elderly, HOH, NADResp: CTABCV: RRR no MRGGI: soft, NT, NDExt: trace LE edemaNeuro: grossly nonfocal, answers questions appropriatelyLabs:CBC Last 24hrs: WBC/Hgb/Hct/Plts:  4.9/8.9/28.80/176 (04/14 0326)Lytes Last 24hrs: Na/K/Cl/CO2:  138/5.0/106/21 (04/14 0326)Chem Last 24hrs: BUN/Cr/GLU/ALT/AST/AMYLASE/LIPASE:  53/3.00/217/20/33/--/-- (04/14 0326)Coags Last 24hrs:  Peripheral Access:Periph IV 06/10/23 1127 cephalic (thumb side), right over-the-needle catheter system 20 gauge 1 in length IV Team (Active)  Foley Catheter:UreTHral Catheter 06/09/23 1216 16 Registered Nurse 10 10 (Active) Foley order placed.Imaging:No results found. I reviewed lab results, microbiology, imaging, test results, and EKG in formulating this patient's plan of care.   Assessment: 89 y.o. male PMHx HFpEF, CAD, L CEA, AAA, HTN, BPH with chronic Foley, orthostatic hypotension brought to the ED with respiratory distress and diarrhea, diagnosed with COVID-19.  Plan: #COVID-19- required BiPAP in the ED, now stable- continue remdesivir/dexamethasone#Diarrhea- c.diff negative- likely viral from COVID-19- DC laxatives#Bacteriuria- original UA with few bacteria, no documented fever or leukocytosis- pt has already received 3 days IV antibiotics- DC antibiotics#CAD#HLD- continue DAPT- per last cardiology note 04/13/2023 pt to continue ASA and Plavix- continue statin#Orthostatic Hypotension- midodrine PRN (home regimen)#CKD Stage 4- Cr stable, continue sodium bicarbonate#Depression- continue Lexapro  Comorbidities Comorbidities present on admission:Chronic Heart Failure with preserved ejection fraction, last EF: 65% 05/10/2023.   PMH of COVID  Secondary diagnoses occurring during hospitalization:Hypocalcemia Diet: Nutrition SupplementsDiet RegularVTE PPx:  heparin (PORCINE) Medication Reconciliation: Complete Communication: Family: daughter [name], RN, and Case ManagementDischarge Readiness:   AM-PAC (RN/PT): 13 /     Expected discharge location: Home with servicesBarrier(s) to discharge: transportation Full CodeMedical decision making for this patient was moderate.Signed:Amandeep Nesmith B Emarie Paul, MD, PhD4/14/202511:41 AM

## 2023-06-12 NOTE — Telephone Encounter
 No morning appts avail with dr Glenda Landing

## 2023-06-13 ENCOUNTER — Encounter: Admit: 2023-06-13 | Payer: PRIVATE HEALTH INSURANCE | Attending: Internal Medicine | Primary: Internal Medicine

## 2023-06-13 DIAGNOSIS — I714 AAA (abdominal aortic aneurysm) (HC Code): Secondary | ICD-10-CM

## 2023-06-13 DIAGNOSIS — E782 Mixed hyperlipidemia: Secondary | ICD-10-CM

## 2023-06-13 DIAGNOSIS — E785 Hyperlipidemia, unspecified: Secondary | ICD-10-CM

## 2023-06-13 DIAGNOSIS — K219 Gastro-esophageal reflux disease without esophagitis: Secondary | ICD-10-CM

## 2023-06-13 DIAGNOSIS — N400 Enlarged prostate without lower urinary tract symptoms: Secondary | ICD-10-CM

## 2023-06-13 DIAGNOSIS — D509 Iron deficiency anemia, unspecified: Secondary | ICD-10-CM

## 2023-06-13 DIAGNOSIS — J329 Chronic sinusitis, unspecified: Secondary | ICD-10-CM

## 2023-06-13 DIAGNOSIS — J309 Allergic rhinitis, unspecified: Secondary | ICD-10-CM

## 2023-06-13 DIAGNOSIS — N184 Chronic kidney disease, stage 4 (severe): Secondary | ICD-10-CM

## 2023-06-13 DIAGNOSIS — H9193 Unspecified hearing loss, bilateral: Secondary | ICD-10-CM

## 2023-06-13 DIAGNOSIS — I5030 Unspecified diastolic (congestive) heart failure: Secondary | ICD-10-CM

## 2023-06-13 DIAGNOSIS — I6529 Occlusion and stenosis of unspecified carotid artery: Secondary | ICD-10-CM

## 2023-06-13 DIAGNOSIS — I10 Essential (primary) hypertension: Secondary | ICD-10-CM

## 2023-06-13 DIAGNOSIS — I252 Old myocardial infarction: Secondary | ICD-10-CM

## 2023-06-13 DIAGNOSIS — M4800 Spinal stenosis, site unspecified: Secondary | ICD-10-CM

## 2023-06-13 DIAGNOSIS — I2510 Atherosclerotic heart disease of native coronary artery without angina pectoris: Secondary | ICD-10-CM

## 2023-06-13 DIAGNOSIS — D568 Other thalassemias: Secondary | ICD-10-CM

## 2023-06-13 DIAGNOSIS — Z955 Presence of coronary angioplasty implant and graft: Secondary | ICD-10-CM

## 2023-06-13 LAB — PHOSPHORUS     (BH GH L LMW YH): BKR PHOSPHORUS: 3.2 mg/dL (ref 2.2–4.5)

## 2023-06-13 LAB — MAGNESIUM: BKR MAGNESIUM: 1.9 mg/dL (ref 1.7–2.4)

## 2023-06-13 LAB — LIVER FUNCTION TESTS     (YH)
BKR ALANINE AMINOTRANSFERASE (ALT): 25 U/L (ref 9–59)
BKR ALKALINE PHOSPHATASE: 77 U/L (ref 9–122)
BKR ASPARTATE AMINOTRANSFERASE (AST): 32 U/L (ref 10–35)
BKR AST/ALT RATIO: 1.3
BKR BILIRUBIN DIRECT: 0.1 mg/dL (ref <=0.2)
BKR BILIRUBIN TOTAL: 0.2 mg/dL (ref <=1.2)

## 2023-06-13 LAB — BASIC METABOLIC PANEL
BKR ANION GAP: 12 (ref 7–17)
BKR BLOOD UREA NITROGEN: 48 mg/dL — ABNORMAL HIGH (ref 8–23)
BKR BUN / CREAT RATIO: 17.8 (ref 8.0–23.0)
BKR CALCIUM: 8.5 mg/dL — ABNORMAL LOW (ref 8.8–10.2)
BKR CHLORIDE: 105 mmol/L (ref 98–107)
BKR CO2: 21 mmol/L (ref 20–30)
BKR CREATININE DELTA: -0.3
BKR CREATININE: 2.7 mg/dL — ABNORMAL HIGH (ref 0.40–1.30)
BKR EGFR, CREATININE (CKD-EPI 2021): 21 mL/min/{1.73_m2} — ABNORMAL LOW (ref >=60)
BKR GLUCOSE: 109 mg/dL — ABNORMAL HIGH (ref 70–100)
BKR POTASSIUM: 5.1 mmol/L (ref 3.3–5.3)
BKR SODIUM: 138 mmol/L (ref 136–144)

## 2023-06-13 LAB — CBC WITH AUTO DIFFERENTIAL
BKR WAM ABSOLUTE IMMATURE GRANULOCYTES.: 0.04 x 1000/ÂµL (ref 0.00–0.30)
BKR WAM ABSOLUTE LYMPHOCYTE COUNT.: 1.45 x 1000/ÂµL (ref 0.60–3.70)
BKR WAM ABSOLUTE NRBC (2 DEC): 0 x 1000/ÂµL (ref 0.00–1.00)
BKR WAM ANC (ABSOLUTE NEUTROPHIL COUNT): 4.86 x 1000/ÂµL (ref 2.00–7.60)
BKR WAM BASOPHIL ABSOLUTE COUNT.: 0.01 x 1000/ÂµL (ref 0.00–1.00)
BKR WAM BASOPHILS: 0.1 % (ref 0.0–1.4)
BKR WAM EOSINOPHIL ABSOLUTE COUNT.: 0.03 x 1000/ÂµL (ref 0.00–1.00)
BKR WAM EOSINOPHILS: 0.4 % (ref 0.0–5.0)
BKR WAM HEMATOCRIT (2 DEC): 31.7 % — ABNORMAL LOW (ref 38.50–50.00)
BKR WAM HEMOGLOBIN: 9.7 g/dL — ABNORMAL LOW (ref 13.2–17.1)
BKR WAM IMMATURE GRANULOCYTES: 0.6 % (ref 0.0–1.0)
BKR WAM LYMPHOCYTES: 20.6 % (ref 17.0–50.0)
BKR WAM MCH (PG): 22.9 pg — ABNORMAL LOW (ref 27.0–33.0)
BKR WAM MCHC: 30.6 g/dL — ABNORMAL LOW (ref 31.0–36.0)
BKR WAM MCV: 74.8 fL — ABNORMAL LOW (ref 80.0–100.0)
BKR WAM MONOCYTE ABSOLUTE COUNT.: 0.65 x 1000/ÂµL (ref 0.00–1.00)
BKR WAM MONOCYTES: 9.2 % (ref 4.0–12.0)
BKR WAM MPV: 10.7 fL (ref 8.0–12.0)
BKR WAM NEUTROPHILS: 69.1 % (ref 39.0–72.0)
BKR WAM NUCLEATED RED BLOOD CELLS: 0 % (ref 0.0–1.0)
BKR WAM PLATELETS: 204 x1000/ÂµL (ref 150–420)
BKR WAM RDW-CV: 18.4 % — ABNORMAL HIGH (ref 11.0–15.0)
BKR WAM RED BLOOD CELL COUNT.: 4.24 M/ÂµL (ref 4.00–6.00)
BKR WAM WHITE BLOOD CELL COUNT: 7 x1000/ÂµL (ref 4.0–11.0)

## 2023-06-13 LAB — NT-PROBNPE: BKR B-TYPE NATRIURETIC PEPTIDE, PRO (PROBNP): 4416 pg/mL — ABNORMAL HIGH (ref <450.0)

## 2023-06-13 NOTE — Plan of Care
 Plan of Care Overview/ Patient StatusProblem: Adult Inpatient Plan of CareGoal: Readiness for Transition of CareOutcome: Interventions implemented as appropriate CM contacted patient's daughter, Amalia Badder, I discussed with the her that the Care Management Department will send out a minimum of 5 referrals to facilities, when appropriate, that can meet their needs.  Referrals will include a patient/family preference along with facilities within a geographical area from the patient's home. If the preferred facility does not have an appropriate bed that can meet their needs, referrals will continue to be sent to identify other facilities that have available beds to avoid delays in discharge. Once the provider determines the patient is medically ready for discharge, the patient will transition to the accepting facility for initiation of their skilled needs. She states understanding. CM to continue to follow patient's progress and discuss plan of care with treatment team. 2:15pm: Auth received for [facility]. Per [facility] dept, [facility] is OON with insurance plan and patient may or may not have a co-pay, patient does have out-of-network benefit. No mention of co-pay from [facility]. They are able to take patient pre-11 am tomorrow. Patient will travel via ambulance. Message left with patients daughter to inform of same. Awaiting call back. 3:20pm: Call made to [facility] and they did not note any co-pay for patient but PT outpatient for is $20, Amalia Badder, informed of same. Darina Edis, MSN, FNP-BC, Scientist, clinical (histocompatibility and immunogenetics) [facility]

## 2023-06-13 NOTE — Plan of Care
 Inpatient Physical Therapy Evaluation IP Adult PT Eval/Treat - 06/13/23 0920    Date of Visit / Treatment  Date of Visit / Treatment 06/13/23   Note Type Evaluation   Progress Report Due 06/27/23   Start Time 8:40   End Time 9:20   Total Treatment Time 40 min    General Information  Pertinent History Of Current Problem Per chart: Pt is a 89 y.o. male PMHx HFpEF, CAD, L CEA, AAA, HTN, BPH with chronic Foley, orthostatic hypotension brought to the ED with respiratory distress and diarrhea, diagnosed with COVID-19.   Subjective Pt agreeable   Referring Physician  Dr. Beverely Buba Observations pt received sitting up finishing breakfast, on room air, foley, agreeable to PT eval, cleared by RN   Precautions/Limitations Fall Precautions;Bed alarm;Chair alarm   Precautions/Limitations Comment COVID+, HOH    Weight Bearing Status  Weight Bearing Status WNL - Within normal limits    Prior Level of Functioning/Social History  Additional Comments per chart: pt  lives with his daughter in a 2 level condo, has 2 chair lifts within the home, ambulates using a rollator, also owns a RW, SC, wheelchair, commode, and shower chair.    Vital Signs and Orthostatic Vital Signs  Vital Signs: Pre, During, & Post treatment Pre Vitals;During Vitals;Post Vitals      Pre Treatment Heart Rate (beats/min) 66      Pre Treatment SpO2 (%) 98      Pre Treatment O2 Delivery room air   During Treatment SpO2 (%) 80    During Treatment O2 Delivery room air      Symptoms Noted During Treatment shortness of breath   During Treatment Vitals Comments desats with mininmal activity but quickly recovers with rest      Post Treatment Heart Rate (beats/min) 78      Post Treatment SpO2 (%) 97      Post Treatment O2 Delivery room air      Post Treatment Vitals Comments RN/MD aware    Pain/Comfort  Pain Comment (Pre/Post Treatment Pain) no c/o pain    Cognition  Level of Consciousness alert   Following Commands Follows one step commands with increased time;Follows one step commands with repetition   Administrator, arts / Judgment Fall risk   Cognition Comments pleasant and cooperative, HOH    Vision/ Hearing  Hearing difficulties / Use of hearing aids HOH    Range of Motion  Range of Motion Examination bilateral upper extremity ROM was WFL;bilateral lower extremity ROM was [facility]    Manual Muscle Testing  Manual Muscle Testing Comments BUE and BLE grossly at least 3/5, generalized weakness    Muscle Tone  Muscle Tone Testing Results No muscle tone deficits noted    Neurologic  Neurologic Assessment Comments +tremors BUE's    Coordination  Coordination Comments GMC slowed, tremulous    Sensory Assessment  Sensory Tests Results No sensory impairment noted    Skin Assessment  Skin Assessment See Nursing Documentation    Balance  Sitting Balance: Static  FAIR       Maintains static position without assist or device, may require Supervision or Verbal Cues (<2 minutes)   Sitting Balance: Dynamic  FAIR-     Performs dynamic activities through partial range (50-75%) with Contact Guard   Standing Balance: Static POOR+    Minimal assist to maintain static position with no Assistive Device   Standing Balance: Dynamic  POOR     Moves through 1/4 to 1/2 ROM  range with moderate assist to right self   Balance Assist Device Rolling walker   Balance Skills Training Comment assist x 1    Bed Mobility  Supine-to-Sit Independence/Assistance Level Minimum assist;Assist of 1   Supine-to-Sit Assist Device Head of bed elevated;Hand held assist;Bed rails   Bed Mobility Comments incr time/effort, fatigues and desats when moving requiring incr rest time between transitions    Sit-Stand Transfer Training  Sit-to-Stand Transfer Independence/Assistance Level Minimum assist;Assist of 1   Sit-to-Stand Transfer Assist Device Rolling walker   Stand-to-Sit Transfer Independence/Assistance Level Minimum assist;Assist of 1   Stand-to-Sit Transfer Assist Device Rolling walker   Sit-Stand Transfer Comments x 2 trials, cues for hand placement and upright posture, incontinent of stool in bed requiring assist for pericare    Gait Training  Symptoms Noted During/After Treatment  fatigue;shortness of breath   Independence/Assistance Level  Minimum assist;Assist of 1;Verbal cues   Assistive Device  Rolling walker   Gait Distance 5 feet;bed to chair   Gait Analysis Deviations decreased cadence;increased time in double stance;decreased step length;decreased toe-to-floor clearance   Gait Training Comments slow shuffling steps, forward flexed posture, easily fatigues on exertion    Handoff Documentation  Handoff Patient in chair;Chair alarm;Pressure relief cushion;Patient instructed to call nursing for mobility;Discussed with nursing    Activity Tolerance  Activity Tolerance Comments fair (-)    PT- AM-PAC - Basic Mobility Screen- How much help from another person do you currently need.....  Turning from your back to your side while in a a flat bed without using rails? 3 - A Little - Requires a little help (supervision, minimal assistance). Can use assistive devices.   Moving from lying on your back to sitting on the side of a flat bed without using bed rails? 2 - A Lot - Requires a lot of help (maximum to moderate assistance). Can use assistive devices.   Moving to and from a bed to a chair (including a wheelchair)? 3 - A Little - Requires a little help (supervision, minimal assistance). Can use assistive devices.   Standing up from a chair using your arms(e.g., wheelchair or bedside chair)? 3 - A Little - Requires a little help (supervision, minimal assistance). Can use assistive devices.   To walk in a hospital room? 2 - A Lot - Requires a lot of help (maximum to moderate assistance). Can use assistive devices.   Climbing 3-5 steps with a railing? 1 - Total - Requires total assistance or cannot do it at all.   AMPAC Mobility Score 14   TARGET Highest Level of Mobility Mobility Level 4, Transfer to chair   ACTUAL Highest Level of Mobility Mobility Level 5, Stand for 1 minute    Therapeutic Functional Activity  Therapeutic Functional Activity Comments bed mobility, transfers, standing balance for incontinence care, incr time required between transitions to recover, cues for posture, PLB    Clinical Impression  Initial Assessment Pt tolerated PT eval fairly.  Limited by impaired strength, balance, activity tolerance, and functional mobility.  Pt desats with minimal activity but able to recover requickly with rest on room air.  Requires assist x 1 for bed mobility and transfers.  Recommend cont skilled PT and moderate complexity support.   Criteria for Skilled Therapeutic Interventions Met yes;treatment indicated   Rehab Potential good, to achieve stated therapy goals    Patient/Family Stated Goals  Patient/Family Stated Goal(s) feel better    Frequency/Equipment Recommendations  PT Frequency 3x per week   Next Treatment Expected  06/14/23   PT/PTA completing this assessment Kim   Equipment Needs During Admission/Treatment Rolling walker    PT Recommendations for Inpatient Admission  Activity/Level of Assist out of bed;transfers only;assist of 1;with rolling walker    Planned Treatment / Interventions  Plan for Next Visit progress mobility as tolerated   Education Treatment / Interventions Patient Education / Training   PT POC, goals, safety, pacing, PLB, mobility/txfers   PT Discharge Summary  Physical Therapy Disposition Recommendation Moderate complexity support and therapy to progress functional mobility/ ADLs/ IADLs recommended for post- acute care.  See assessment for additional details.   Additional Therapy Recommendations Physical Therapy Services in Discharge Environment   Equipment Recommendations for Discharge To be determined pending progress     Problem: Physical Therapy GoalsGoal: Physical Therapy GoalsDescription: PT GOALS1. Patient will perform bed mobility independently2. Patient will perform transfers with least assistive device with modified independence3. Patient will ambulate a minimum of 50 feet with least assistive device with modified independence4. Patient will maintain O2 sats >92% on room air with mobility to improve activity toleranceOutcome: Initial problem identification Cay Cocking, PT, DPT

## 2023-06-13 NOTE — Progress Notes
 [facility] Progress NoteHospital Medicine ServiceAttending Provider: Carollee Herter, MD  Location:  L462/L462-01Hospital Day #4Subjective   Patient seen and examined on 06/13/2023.CC: COVID-19 virus infectionInterim History: - worked with PT this morning, desaturated to the low 80s on RA with minimal activity but quickly recovered with rest, and without supplemental oxygen- feeling better, no acute concerns  Objective  Vitals:Temp:  [97.3 ?F (36.3 ?C)-98 ?F (36.7 ?C)] 97.8 ?F (36.6 ?C)Pulse:  [65-69] 69Resp:  [18] 18BP: (132-140)/(69-72) 140/72SpO2:  [96 %-100 %] 100 %Device (Oxygen Therapy): room airI/O's:Intake/Output Summary (Last 24 hours) at 06/13/2023 1032Last data filed at 06/13/2023 0632Gross per 24 hour Intake 250.07 ml Output 2350 ml Net -2099.93 ml  Physical ExamGen: elderly, HOH, NADResp: CTABCV: RRR, systolic murmurGI: soft, NT, NDGU: Foley to gravity draining clear yellow urineExt: trace LE edemaNeuro: grossly nonfocal, answers questions appropriatelyLabs:CBC Last 24hrs: WBC/Hgb/Hct/Plts:  7.0/9.7/31.70/204 (04/15 0916)Lytes Last 24hrs: Na/K/Cl/CO2:  138/5.1/105/21 (04/15 0916)Chem Last 24hrs: BUN/Cr/GLU/ALT/AST/AMYLASE/LIPASE:  48/2.70/109/25/32/--/-- (04/15 0916)Coags Last 24hrs:  Peripheral Access:Periph IV 06/10/23 1127 cephalic (thumb side), right over-the-needle catheter system 20 gauge 1 in length IV Team (Active)  Foley Catheter:UreTHral Catheter 06/09/23 1216 16 Registered Nurse 10 10 (Active) Foley order placed.Imaging:XR Chest PA or AP (Portable)Result Date: 4/14/2025XR CHEST PA OR AP Date: 06/12/2023 4:36 PM INDICATION: significant dyspnea and hypoxia on exertion, COVID-19 positive, prior normal chest x-ray. COMPARISON: 06/09/2023 chest radiograph. FINDINGS: New mild left basilar atelectasis. No airspace or interstitial opacities. No left pleural effusion. The right costophrenic sulcus is excluded from the field. No pneumothorax. The cardiomediastinal silhouette is stable.  No interval airspace or interstitial opacities, left pleural effusion or pneumothorax. New mild left basilar atelectasis. [facility] Radiology Notify System Classification: Routine. Reported and signed by: Blinda Leatherwood, MD  [facility]ical [facility]   I reviewed lab results, microbiology, imaging, test results, and EKG in formulating this patient's plan of care.   Assessment: 89 y.o. male PMHx HFpEF, CAD, L CEA, AAA, HTN, BPH with chronic Foley, orthostatic hypotension brought to the ED with respiratory distress and diarrhea, diagnosed with COVID-19.  Plan: #COVID-19#Acute Hypoxemic Respiratory Failure- required BiPAP in the ED, now stable- CTA chest negative for PE- repeat CXR 4/14 nil acute- continue remdesivir/dexamethasone- PT evaluation#Diarrhea- c.diff negative- [facility]#Bacteriuria- original UA with few bacteria, no documented fever or leukocytosis- s/p 3 days IV antibiotics for presumed UTI- will monitor#CAD#HLD- continue DAPT- per last cardiology note 04/13/2023 pt to continue ASA and Plavix- continue statin#Orthostatic Hypotension- midodrine PRN (home regimen)#CKD Stage 4- Cr stable, continue sodium bicarbonate, UOP robust#Depression- continue Lexapro  Comorbidities Comorbidities present on admission:Chronic Heart Failure with preserved ejection fraction, last EF: 65% 05/10/2023. 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)Chronic AnemiaPMH of COVID  Secondary diagnoses occurring during hospitalization:Hypocalcemia Diet: Nutrition SupplementsDiet RegularVTE PPx:  heparin (PORCINE) Medication Reconciliation: Complete Communication: RN and Case ManagementDischarge Readiness: Expected Discharge Date: 06/13/23 AM-PAC (RN/PT): 13 /     Expected discharge location: Short Term RehabBarrier(s) to discharge: not medically ready Full CodeMedical decision making for this patient was moderate.Signed:Keisha Amer B Machele Deihl, MD, PhD4/15/202510:32 AM

## 2023-06-13 NOTE — Care Coordination-Inpatient
 TC called pt dtr Amalia Badder to discuss bed offer at [facility] and pt dtr accepted bed offer..TC routed pt to the [employer] for insurance authorizationCM informed.Care management Team will continue to follow along with the patient's medical team and discharge planning needs.Edd Gong NevinTransition CoordinatorYale Mapleton HospitalCare Management ST. Raphael CampusCell#[phone] [phone].[email or web address]

## 2023-06-13 NOTE — Plan of Care
 Plan of Care Overview/ Patient StatusPt alert and oriented x3, disoriented to situation. Pt hard of hearing, hearing aides at bedside. Pt dyspneic on exertion. Non productive cough, given robitussin. On tele, NSR. Foley in place. Blood sugars q6. Pt with stage 2 to coccyx, foam drsg in place. Occasionally incontinent of stool. Frequent repos. Safety precuations. Bed alarmed. Problem: Adult Inpatient Plan of CareGoal: Plan of Care ReviewOutcome: Interventions implemented as appropriateGoal: Optimal Comfort and WellbeingOutcome: Interventions implemented as appropriate Problem: Fall Injury RiskGoal: Absence of Fall and Fall-Related InjuryOutcome: Interventions implemented as appropriate

## 2023-06-13 NOTE — Plan of Care
 Plan of Care Overview/ Patient StatusPatient resting in bed, medications given per [facility]. A&Ox4, on room air, lung sounds diminished. Has nonproductive cough, cough syrup given. Tele intact, NSR. Continent of bowel, had two small loose Bms on BSC, chronic foley catheter, foley cares completed. Linens and gown changed. Skin with stage II, barrier cream and mepilex applied. PIV RUE clean dry intact, patent, flushed. Ax1-2 with RW from bed to recliner. Safety maintained, bed alarm on while in bed, chair alarm on, cushion in place, frequent repositioning encouraged, bed in lowest position, call light and items within reach. COVID, special respiratory precautions in place.1800Resting in bed, worked with PT and sat up in recliner for a couple hours. Given prn cough syrup x2. On room air at rest, on 2L nasal cannula with activity, desaturated to 82% after moving from recliner to bed. Daughter given update over phone, stated that he cannot go to rehab as planned due to insurance not covering stay, notified MD, case manager to be notified tomorrow morning. Safety maintained, bed alarm on while in bed, chair alarm on, cushion in place, frequent repositioning encouraged, bed in lowest position, call light and items within reach. COVID, special respiratory precautions in place.Problem: Violence Risk or ActualGoal: Anger and Impulse ControlOutcome: Interventions implemented as appropriate Problem: Adult Inpatient Plan of CareGoal: Plan of Care ReviewOutcome: Interventions implemented as appropriateGoal: Patient-Specific Goal (Individualized)Outcome: Interventions implemented as appropriateGoal: Absence of Hospital-Acquired Illness or InjuryOutcome: Interventions implemented as appropriateGoal: Optimal Comfort and WellbeingOutcome: Interventions implemented as appropriateGoal: Readiness for Transition of CareOutcome: Interventions implemented as appropriate Problem: InfectionGoal: Absence of Infection Signs and SymptomsOutcome: Interventions implemented as appropriate Problem: WoundGoal: Optimal CopingOutcome: Interventions implemented as appropriateGoal: Optimal Functional AbilityOutcome: Interventions implemented as appropriateGoal: Absence of Infection Signs and SymptomsOutcome: Interventions implemented as appropriateGoal: Improved Oral IntakeOutcome: Interventions implemented as appropriateGoal: Optimal Pain Control and FunctionOutcome: Interventions implemented as appropriateGoal: Skin Health and IntegrityOutcome: Interventions implemented as appropriateGoal: Optimal Wound HealingOutcome: Interventions implemented as appropriate Problem: Fall Injury RiskGoal: Absence of Fall and Fall-Related InjuryOutcome: Interventions implemented as appropriate Problem: Skin Injury Risk IncreasedGoal: Skin Health and IntegrityOutcome: Interventions implemented as appropriate Problem: Physical Therapy GoalsGoal: Physical Therapy GoalsDescription: PT GOALS1. Patient will perform bed mobility independently2. Patient will perform transfers with least assistive device with modified independence3. Patient will ambulate a minimum of 50 feet with least assistive device with modified independence4. Patient will maintain O2 sats >92% on room air with mobility to improve activity toleranceOutcome: Interventions implemented as appropriate

## 2023-06-13 NOTE — Care Coordination-Inpatient
 PENDING INSURANCE AUTHORIZATION THIS PATIENT CANNOT BE DISCHARGED UNTIL DETERMINATION OBTAINEDSecured Facility: [name] has been initiated with this pt's insurer. Clinical information submitted to this patient's insurer for review today via [facility] portal. We now await the authorization that will allow this patient to transfer to the facility.    Insurance Contact: [facility] TransitionsPhone: [phone]: [phone] No.: [id number]

## 2023-06-14 DIAGNOSIS — I130 Hypertensive heart and chronic kidney disease with heart failure and stage 1 through stage 4 chronic kidney disease, or unspecified chronic kidney disease: Secondary | ICD-10-CM

## 2023-06-14 DIAGNOSIS — H9193 Unspecified hearing loss, bilateral: Secondary | ICD-10-CM

## 2023-06-14 DIAGNOSIS — N179 Acute kidney failure, unspecified: Secondary | ICD-10-CM

## 2023-06-14 DIAGNOSIS — E782 Mixed hyperlipidemia: Secondary | ICD-10-CM

## 2023-06-14 DIAGNOSIS — B961 Klebsiella pneumoniae [K. pneumoniae] as the cause of diseases classified elsewhere: Secondary | ICD-10-CM

## 2023-06-14 DIAGNOSIS — N136 Pyonephrosis: Secondary | ICD-10-CM

## 2023-06-14 DIAGNOSIS — K219 Gastro-esophageal reflux disease without esophagitis: Secondary | ICD-10-CM

## 2023-06-14 DIAGNOSIS — F32A Depression, unspecified: Secondary | ICD-10-CM

## 2023-06-14 DIAGNOSIS — Z955 Presence of coronary angioplasty implant and graft: Secondary | ICD-10-CM

## 2023-06-14 DIAGNOSIS — I252 Old myocardial infarction: Secondary | ICD-10-CM

## 2023-06-14 DIAGNOSIS — N320 Bladder-neck obstruction: Secondary | ICD-10-CM

## 2023-06-14 DIAGNOSIS — I951 Orthostatic hypotension: Secondary | ICD-10-CM

## 2023-06-14 DIAGNOSIS — Z7982 Long term (current) use of aspirin: Secondary | ICD-10-CM

## 2023-06-14 DIAGNOSIS — N401 Enlarged prostate with lower urinary tract symptoms: Secondary | ICD-10-CM

## 2023-06-14 DIAGNOSIS — Z7902 Long term (current) use of antithrombotics/antiplatelets: Secondary | ICD-10-CM

## 2023-06-14 DIAGNOSIS — D631 Anemia in chronic kidney disease: Secondary | ICD-10-CM

## 2023-06-14 DIAGNOSIS — I5032 Chronic diastolic (congestive) heart failure: Secondary | ICD-10-CM

## 2023-06-14 DIAGNOSIS — Z79899 Other long term (current) drug therapy: Secondary | ICD-10-CM

## 2023-06-14 DIAGNOSIS — R338 Other retention of urine: Secondary | ICD-10-CM

## 2023-06-14 DIAGNOSIS — U071 COVID-19: Secondary | ICD-10-CM

## 2023-06-14 DIAGNOSIS — J9601 Acute respiratory failure with hypoxia: Secondary | ICD-10-CM

## 2023-06-14 DIAGNOSIS — Z87891 Personal history of nicotine dependence: Secondary | ICD-10-CM

## 2023-06-14 DIAGNOSIS — M4800 Spinal stenosis, site unspecified: Secondary | ICD-10-CM

## 2023-06-14 DIAGNOSIS — N184 Chronic kidney disease, stage 4 (severe): Secondary | ICD-10-CM

## 2023-06-14 DIAGNOSIS — I2510 Atherosclerotic heart disease of native coronary artery without angina pectoris: Secondary | ICD-10-CM

## 2023-06-14 DIAGNOSIS — Z888 Allergy status to other drugs, medicaments and biological substances status: Secondary | ICD-10-CM

## 2023-06-14 LAB — CBC WITH AUTO DIFFERENTIAL
BKR WAM ABSOLUTE IMMATURE GRANULOCYTES.: 0.1 x 1000/ÂµL (ref 0.00–0.30)
BKR WAM ABSOLUTE LYMPHOCYTE COUNT.: 1.21 x 1000/ÂµL (ref 0.60–3.70)
BKR WAM ABSOLUTE NRBC (2 DEC): 0 x 1000/ÂµL (ref 0.00–1.00)
BKR WAM ANC (ABSOLUTE NEUTROPHIL COUNT): 6.51 x 1000/ÂµL (ref 2.00–7.60)
BKR WAM BASOPHIL ABSOLUTE COUNT.: 0.01 x 1000/ÂµL (ref 0.00–1.00)
BKR WAM BASOPHILS: 0.1 % (ref 0.0–1.4)
BKR WAM EOSINOPHIL ABSOLUTE COUNT.: 0.01 x 1000/ÂµL (ref 0.00–1.00)
BKR WAM EOSINOPHILS: 0.1 % (ref 0.0–5.0)
BKR WAM HEMATOCRIT (2 DEC): 31.1 % — ABNORMAL LOW (ref 38.50–50.00)
BKR WAM HEMOGLOBIN: 9.6 g/dL — ABNORMAL LOW (ref 13.2–17.1)
BKR WAM IMMATURE GRANULOCYTES: 1.2 % — ABNORMAL HIGH (ref 0.0–1.0)
BKR WAM LYMPHOCYTES: 14.2 % — ABNORMAL LOW (ref 17.0–50.0)
BKR WAM MCH (PG): 22.8 pg — ABNORMAL LOW (ref 27.0–33.0)
BKR WAM MCHC: 30.9 g/dL — ABNORMAL LOW (ref 31.0–36.0)
BKR WAM MCV: 73.9 fL — ABNORMAL LOW (ref 80.0–100.0)
BKR WAM MONOCYTE ABSOLUTE COUNT.: 0.67 x 1000/ÂµL (ref 0.00–1.00)
BKR WAM MONOCYTES: 7.9 % (ref 4.0–12.0)
BKR WAM MPV: 10.1 fL (ref 8.0–12.0)
BKR WAM NEUTROPHILS: 76.5 % — ABNORMAL HIGH (ref 39.0–72.0)
BKR WAM NUCLEATED RED BLOOD CELLS: 0 % (ref 0.0–1.0)
BKR WAM PLATELETS: 229 x1000/ÂµL (ref 150–420)
BKR WAM RDW-CV: 18.6 % — ABNORMAL HIGH (ref 11.0–15.0)
BKR WAM RED BLOOD CELL COUNT.: 4.21 M/ÂµL (ref 4.00–6.00)
BKR WAM WHITE BLOOD CELL COUNT: 8.5 x1000/ÂµL (ref 4.0–11.0)

## 2023-06-14 LAB — BASIC METABOLIC PANEL
BKR ANION GAP: 9 (ref 7–17)
BKR BLOOD UREA NITROGEN: 52 mg/dL — ABNORMAL HIGH (ref 8–23)
BKR BUN / CREAT RATIO: 17.9 (ref 8.0–23.0)
BKR CALCIUM: 8.5 mg/dL — ABNORMAL LOW (ref 8.8–10.2)
BKR CHLORIDE: 107 mmol/L (ref 98–107)
BKR CO2: 20 mmol/L (ref 20–30)
BKR CREATININE DELTA: 0.2
BKR CREATININE: 2.9 mg/dL — ABNORMAL HIGH (ref 0.40–1.30)
BKR EGFR, CREATININE (CKD-EPI 2021): 19 mL/min/{1.73_m2} — ABNORMAL LOW (ref >=60)
BKR GLUCOSE: 124 mg/dL — ABNORMAL HIGH (ref 70–100)
BKR POTASSIUM: 5.3 mmol/L (ref 3.3–5.3)
BKR SODIUM: 136 mmol/L (ref 136–144)

## 2023-06-14 LAB — BLOOD CULTURE   (BH GH L LMW YH): BKR BLOOD CULTURE: NO GROWTH

## 2023-06-14 LAB — PHOSPHORUS     (BH GH L LMW YH): BKR PHOSPHORUS: 3.8 mg/dL (ref 2.2–4.5)

## 2023-06-14 LAB — LIVER FUNCTION TESTS     (YH)
BKR ALANINE AMINOTRANSFERASE (ALT): 40 U/L (ref 9–59)
BKR ALKALINE PHOSPHATASE: 100 U/L (ref 9–122)
BKR ASPARTATE AMINOTRANSFERASE (AST): 61 U/L — ABNORMAL HIGH (ref 10–35)
BKR AST/ALT RATIO: 1.5
BKR BILIRUBIN DIRECT: 0.1 mg/dL (ref <=0.2)
BKR BILIRUBIN TOTAL: 0.2 mg/dL (ref <=1.2)

## 2023-06-14 LAB — MAGNESIUM: BKR MAGNESIUM: 2 mg/dL (ref 1.7–2.4)

## 2023-06-14 NOTE — Plan of Care
 Inpatient Physical Therapy Progress Note IP Adult PT Eval/Treat - 06/14/23 1146    Date of Visit / Treatment  Date of Visit / Treatment 06/14/23   Note Type Daily Note   Start Time 1116   End Time 1146   Total Treatment Time 30    General Information  Subjective agreeable   General Observations seated in recliner; RA; foley; PIV; hearing aids   Precautions/Limitations Fall Precautions;Bed alarm;Chair alarm   Precautions/Limitations Comment COVID+, HOH    Vital Signs and Orthostatic Vital Signs  Vital Signs Free text RA: NAD   During Treatment SpO2 (%) 98   During Treatment O2 Delivery room air   Sitting Orthostatic BP 132/71   Sitting Orthostatic Pulse 70   Standing Orthostatic BP 90/46   Standing Orthostatic Pulse 68   Orthostatic comment returned to 116/62 HR 69; RN alerted    Pain/Comfort  Pain Comment (Pre/Post Treatment Pain) denies pain    Patient Coping  Observed Emotional State accepting;cooperative    Cognition  Overall Cognitive Status Within Functional Limits   Level of Consciousness alert   Following Commands Follows one step commands with increased time;Follows one step commands with repetition    Vision/ Hearing  Hearing difficulties / Use of hearing aids [facility]    Skin Assessment  Skin Assessment See Nursing Documentation    Balance  Sitting Balance: Static  FAIR       Maintains static position without assist or device, may require Supervision or Verbal Cues (<2 minutes)   Sitting Balance: Dynamic  FAIR-     Performs dynamic activities through partial range (50-75%) with Contact Guard   Standing Balance: Static POOR+    Minimal assist to maintain static position with no Assistive Device   Standing Balance: Dynamic  POOR     Moves through 1/4 to 1/2 ROM range with moderate assist to right self   Balance Assist Device Rolling walker    Bed Mobility  Bed Mobility Comments pt recieved and returned to recliner chair    Sit-Stand Transfer Training  Sit-to-Stand Transfer Independence/Assistance Level Minimum assist   Sit-to-Stand Transfer Assist Device Rolling walker   Stand-to-Sit Transfer Independence/Assistance Level Minimum assist   Stand-to-Sit Transfer Assist Device Rolling walker   Transfer Safety Analysis Concerns cues for hand placement    Gait Training  Symptoms Noted During/After Treatment  fatigue   Independence/Assistance Level  Minimum assist   Assistive Device  Rolling walker   Gait Distance 20 feet;x2   Gait Analysis Deviations decreased cadence;increased time in double stance;decreased step length;decreased toe-to-floor clearance   Gait Training Comments slow shuffling steps    Handoff Documentation  Handoff Patient in chair;Chair alarm;Pressure relief cushion;Patient instructed to call nursing for mobility;Discussed with nursing   Handoff Comments daughter prent in room at end of session    Activity Tolerance  Activity Tolerance Comments fair    PT- AM-PAC - Basic Mobility Screen- How much help from another person do you currently need.....  Turning from your back to your side while in a a flat bed without using rails? 3 - A Little - Requires a little help (supervision, minimal assistance). Can use assistive devices.   Moving from lying on your back to sitting on the side of a flat bed without using bed rails? 3 - A Little - Requires a little help (supervision, minimal assistance). Can use assistive devices.   Moving to and from a bed to a chair (including a wheelchair)? 3 - A Little - Requires  a little help (supervision, minimal assistance). Can use assistive devices.   Standing up from a chair using your arms(e.g., wheelchair or bedside chair)? 3 - A Little - Requires a little help (supervision, minimal assistance). Can use assistive devices.   To walk in a hospital room? 3 - A Little - Requires a little help (supervision, minimal assistance). Can use assistive devices.   Climbing 3-5 steps with a railing? 1 - Total - Requires total assistance or cannot do it at all.   AMPAC Mobility Score 16   TARGET Highest Level of Mobility Mobility Level 5, Stand for 1 minute   ACTUAL Highest Level of Mobility Mobility Level 6, Walk 10+ steps    Therapeutic Exercise  Therapeutic Exercise Detailed Documentation General Therapeutic Exercises    Therapeutic Functional Activity  Therapeutic Functional Activity Comments transfer training; safety and precautions reviewed; balance; strengthening; gait training; posture and positioning    Clinical Impression  Initial Assessment pt was able to tolerate session well; easily fatigued and required frequent, extended seated rest breaks; progress limited by decreased strength, ROM, and balance   Criteria for Skilled Therapeutic Interventions Met yes;treatment indicated   Rehab Potential good, to achieve stated therapy goals    Patient/Family Stated Goals  Patient/Family Stated Goal(s) get stronger    Frequency/Equipment Recommendations  PT Frequency 3x per week   Next Treatment Expected 06/16/23   PT/PTA completing this assessment val   Equipment Needs During Admission/Treatment Rolling walker    PT Recommendations for Inpatient Admission  Activity/Level of Assist out of bed;ambulate;assist of 1;with rolling walker;in room   ADL Recommendations assist to bathroom;assist of 1;with rolling walker    Planned Treatment / Interventions  Plan for Next Visit progress as tolerated   Education Treatment / Interventions Patient Education / Training    PT Discharge Summary  Physical Therapy Disposition Recommendation Moderate complexity support and therapy to progress functional mobility/ ADLs/ IADLs recommended for post- acute care.  See assessment for additional details.   Additional Therapy Recommendations Physical Therapy Services in Discharge Environment Equipment Recommendations for Discharge Levan Hurst - The patient will use a Rolling Walker in the home and outside daily to provide greater stability and safer ambulation for participation in ADLs     Derl Barrow, PTA

## 2023-06-14 NOTE — Other
 Mark [name] was discharged via Wheelchair accompanied by Alone.  Verbalized understanding of discharge instructionsand recommended follow up care as per the after visit summary.  Written discharge instructions provided. Denies any further questions. Vital signs    Vitals:  06/13/23 2029 06/13/23 2155 06/14/23 0200 06/14/23 0600 BP: (!) 117/57 119/63 121/70 (!) 144/73 Pulse: 66 65 66 62 Resp: 20 17 18 17  Temp: 98.7 ?F (37.1 ?C) 98.2 ?F (36.8 ?C) 98.1 ?F (36.7 ?C) 98.2 ?F (36.8 ?C) TempSrc: Oral Oral Oral Oral SpO2: 97% 97% 96% 98% Weight:     Height:     Patient confirmed all belongings returned. Belongings charted in last 7 days: Patient Valuables   Patient Valuables Flowsheet                    PATIENT VALUABLE(S)       Jewelry disposition Sent home   Home with daughter 06/09/23 1405  Vision Disposition At bedside/locker/closet  on person 06/09/23 1250   Clothing Disposition At bedside/locker/closet 06/09/23 1250  HEARING AID Disposition At bedside/locker/closet 06/09/23 1250          Patient kept one hearing aide in place, daughter Mark [name] took other hearing aide and Consulting civil engineer for aides home with her to be serviced, notified rehab facility. Patient had cellphone in his pajamas pocket upon leaving.

## 2023-06-14 NOTE — Plan of Care
 Plan of Care Overview/ Patient StatusProblem: Adult Inpatient Plan of CareGoal: Readiness for Transition of CareOutcome: Interventions implemented as appropriate [place] [employer] completed new ins auth since patient does not have OON benefit but has not been accepted to any other facility.New Ins Siegfried Dress has been now approved for INN at [facility] per insurance auth team. CM updated STR who can accept today.CM updated daughter who has asked for the auth letter be sent to her. Insurance auth team aware.Daughter agrees with cost of private pay medical chair car ($90 plus $7/mile).Medical team updated.Case Management will take lead to arrange post -acute care services and continue to work with the care team as the patient progresses towards discharge. Jakeob Tullis, RN, BSN, Med, [employer] ManagerMHB [phone]

## 2023-06-14 NOTE — Care Coordination-Inpatient
 INSURANCE AUTH OBTAINED FOR STR 06/13/23 1151 Authorization Information Date Authorization Initiated:  06/13/23 Time Authorization Initiated: 1151 Mode Clinical was sent: Portal Facility Name [facility] Facility Authorization yes(Ginger notified) Insurance Company [facility] Medicare Insurance Co. [place] Name/# [facility] Facility Authorization #/Details [id number], 06/14/2023-06/18/2023, 5 days approved, NRD 06/18/2023 - in-ntwk approval Date Authorization recieved 06/14/23 Time Authorization recieved 1029 Follow up contact necessary Yes Contact Name [facility] Contact phone: [phone] Insurance Co. Fax number: [phone]

## 2023-06-14 NOTE — Plan of Care
 Plan of Care Overview/ Patient StatusPatient lying in high semi fowler position in bed watching tv/ aaox4, wearing his glasses. He denies any pain and respiratory difficulty.  Foley catheter intact and attached to  patient appropriately, draining clear yellow urine.  Patient is able to verbalize understanding of his admitting diagnosis, and is able to verbalize his need.call bell in patient lap within his reach. Special respiratory isolation ongoing. Bed alarm on, bed rails x 3 up and lockedProblem: Violence Risk or ActualGoal: Anger and Impulse ControlOutcome: Interventions implemented as appropriate Problem: Adult Inpatient Plan of CareGoal: Plan of Care ReviewOutcome: Interventions implemented as appropriateGoal: Patient-Specific Goal (Individualized)Outcome: Interventions implemented as appropriateGoal: Absence of Hospital-Acquired Illness or InjuryOutcome: Interventions implemented as appropriateGoal: Optimal Comfort and WellbeingOutcome: Interventions implemented as appropriateGoal: Readiness for Transition of CareOutcome: Interventions implemented as appropriate Problem: InfectionGoal: Absence of Infection Signs and SymptomsOutcome: Interventions implemented as appropriate Problem: WoundGoal: Optimal CopingOutcome: Interventions implemented as appropriateGoal: Optimal Functional AbilityOutcome: Interventions implemented as appropriateGoal: Absence of Infection Signs and SymptomsOutcome: Interventions implemented as appropriateGoal: Improved Oral IntakeOutcome: Interventions implemented as appropriateGoal: Optimal Pain Control and FunctionOutcome: Interventions implemented as appropriateGoal: Skin Health and IntegrityOutcome: Interventions implemented as appropriateGoal: Optimal Wound HealingOutcome: Interventions implemented as appropriate Problem: Fall Injury RiskGoal: Absence of Fall and Fall-Related InjuryOutcome: Interventions implemented as appropriate Problem: Skin Injury Risk IncreasedGoal: Skin Health and IntegrityOutcome: Interventions implemented as appropriate Problem: Physical Therapy GoalsGoal: Physical Therapy GoalsDescription: PT GOALS1. Patient will perform bed mobility independently2. Patient will perform transfers with least assistive device with modified independence3. Patient will ambulate a minimum of 50 feet with least assistive device with modified independence4. Patient will maintain O2 sats >92% on room air with mobility to improve activity toleranceOutcome: Interventions implemented as appropriate

## 2023-06-14 NOTE — Plan of Care
 Plan of Care Overview/ Patient StatusPatient status unchanged.  Foley catheter and  peri- care completed liner underpad changed.  Patient repositioned in bed .  No acute event at this time

## 2023-06-14 NOTE — Plan of Care
 Plan of Care Overview/ Patient StatusProblem: Adult Inpatient Plan of CareGoal: Readiness for Transition of CareOutcome: Outcome(s) achieved Case Management Plan  Flowsheet Row Most Recent Value Discharge Planning  Patient/Patient Representative goals/treatment preferences for discharge are:  discharge to STR Patient/Patient Representative was presented with a list of facilities, agencies and/or dme providers and Referral(s) placed for: Short term rehabilitation (at a nursing facility) Facility/Geographical Preference(s) Raynell Caller Facility Name [facility] Mode of Transportation  [employer] (Publishing rights manager) (add comment for special considerations) Transportation Company [employer]/Access Discharge Readiness  PASRR completed and approved [place]born [place] ID # [id number]] Date PASRR Completed 05/10/23 Authorization number obtained, if required Yes Is there a 3 day INPATIENT Qualifying stay for Medicare Patients? N/A Medicare IM- signed, dated, timed and scanned, if required Yes  [06/13/2023 11:43 AM] DME Authorized/Delivered N/A No needs identified/ follow up with PCP/MD/Outpatient Provider N/A Post acute care services secured W10 complete Yes PRI Completed and Accepted ([place]  Patients Only) N/A Is the destination address correct on the W10 Yes Last Bowel Movement 06/13/23 Finalized Plan  Expected Discharge Date 06/14/23 Discharge Disposition Skilled Nursing Facility   Patient medically ready to discharge to STR. STR aware of d/c. Patient and patient's daughter agree with discharge and IM. Daughter agrees with cost of private pay chair car ($90 plus $7/mile).RN to call report and fax w10 and will call daughter when chair car arrives.Salisa Broz, RN, BSN, Med, [employer] ManagerMHB [phone]

## 2023-06-14 NOTE — Plan of Care
 Plan of Care Overview/ Patient Status{? No acute event  .  Patient asleep.  Easily arouseable.  Displays no signs and symptom of pain and respiratory distress
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
## 2023-06-14 NOTE — Care Coordination-Inpatient
 INSURANCE AUTH OBTAINED FOR STR 06/13/23 1151 Authorization Information Date Authorization Initiated:  06/13/23 Time Authorization Initiated: 1151 Mode Clinical was sent: Portal Facility Name [facility] Facility Authorization yes([place]toinette notified) Insurance Company [facility] Mgd Medicare Insurance Co. [place] Name[facility] Authorization #/Details [id number], 06/13/2023-06/15/2023, 3 days approved, NRD 06/15/2023 Date Authorization recieved 06/13/23 Time Authorization recieved 1356 Follow up contact necessary Yes Contact Name [facility] Contact phone: [phone] Insurance Co. Fax number: [phone] Per Jenette Mitchell at [facility] is OON for the members policy. Pt has [employer] Plus plan, which allows OON facilities.Addendum 4/16 - This auth is voided - pt does not have OON benefits, he has an HMO plan

## 2023-06-14 NOTE — Plan of Care
 Plan of Care Overview/ Patient StatusPatient resting in bed, medications given per [facility]. A&Ox4, on room air, lung sounds diminished. Ambulatory oxygen test completed, briefly desaturated to 82% while walking in room, was able to recover without oxygen to 97%, weakness while walking also improving. Has nonproductive cough, cough syrup given. Tele intact, NSR. Continent of bowel, last BM yesterday, chronic foley catheter, foley cares completed. Linens and gown changed. Skin with stage II, barrier cream applied. No IV access. Ax1-2 with RW from bed to recliner. Safety maintained, bed alarm on while in bed, chair alarm on, cushion in place, frequent repositioning encouraged, bed in lowest position, call light and items within reach. COVID, special respiratory precautions in place. Problem: Violence Risk or ActualGoal: Anger and Impulse ControlOutcome: Interventions implemented as appropriate Problem: Adult Inpatient Plan of CareGoal: Plan of Care ReviewOutcome: Interventions implemented as appropriateGoal: Patient-Specific Goal (Individualized)Outcome: Interventions implemented as appropriateGoal: Absence of Hospital-Acquired Illness or InjuryOutcome: Interventions implemented as appropriateGoal: Optimal Comfort and WellbeingOutcome: Interventions implemented as appropriateGoal: Readiness for Transition of CareOutcome: Interventions implemented as appropriate Problem: InfectionGoal: Absence of Infection Signs and SymptomsOutcome: Interventions implemented as appropriate Problem: WoundGoal: Optimal CopingOutcome: Interventions implemented as appropriateGoal: Optimal Functional AbilityOutcome: Interventions implemented as appropriateGoal: Absence of Infection Signs and SymptomsOutcome: Interventions implemented as appropriateGoal: Improved Oral IntakeOutcome: Interventions implemented as appropriateGoal: Optimal Pain Control and FunctionOutcome: Interventions implemented as appropriateGoal: Skin Health and IntegrityOutcome: Interventions implemented as appropriateGoal: Optimal Wound HealingOutcome: Interventions implemented as appropriate Problem: Fall Injury RiskGoal: Absence of Fall and Fall-Related InjuryOutcome: Interventions implemented as appropriate Problem: Skin Injury Risk IncreasedGoal: Skin Health and IntegrityOutcome: Interventions implemented as appropriate Problem: Physical Therapy GoalsGoal: Physical Therapy GoalsDescription: PT GOALS1. Patient will perform bed mobility independently2. Patient will perform transfers with least assistive device with modified independence3. Patient will ambulate a minimum of 50 feet with least assistive device with modified independence4. Patient will maintain O2 sats >92% on room air with mobility to improve activity toleranceOutcome: Interventions implemented as appropriate

## 2023-06-16 LAB — BLOOD CULTURE   (BH GH L LMW YH): BKR BLOOD CULTURE: ABNORMAL — CR

## 2023-06-16 LAB — AEROBIC BLOOD CULTURE IDENTIFICATION PCR (LAB ORDERABLE ONLY)(YH)
BKR MRSA BLOOD PCR: NEGATIVE
BKR SAUR BLOOD PCR: NEGATIVE

## 2023-06-19 ENCOUNTER — Ambulatory Visit: Admit: 2023-06-19 | Payer: PRIVATE HEALTH INSURANCE | Attending: Nephrology | Primary: Internal Medicine

## 2023-07-12 ENCOUNTER — Encounter: Admit: 2023-07-12 | Payer: PRIVATE HEALTH INSURANCE | Attending: Nephrology | Primary: Internal Medicine

## 2023-07-21 ENCOUNTER — Emergency Department: Admit: 2023-07-21 | Payer: Medicare (Managed Care) | Primary: Internal Medicine

## 2023-07-21 ENCOUNTER — Inpatient Hospital Stay
Admit: 2023-07-21 | Discharge: 2023-08-03 | Payer: Medicare (Managed Care) | Attending: Internal Medicine | Admitting: Internal Medicine

## 2023-07-21 LAB — CBC WITH AUTO DIFFERENTIAL
BKR WAM ABSOLUTE IMMATURE GRANULOCYTES.: 0.06 x 1000/ÂµL (ref 0.00–0.30)
BKR WAM ABSOLUTE LYMPHOCYTE COUNT.: 1.01 x 1000/ÂµL (ref 0.60–3.70)
BKR WAM ABSOLUTE NRBC: 0 x 1000/ÂµL (ref 0.00–1.00)
BKR WAM ANC (ABSOLUTE NEUTROPHIL COUNT): 8.49 x 1000/ÂµL — ABNORMAL HIGH (ref 2.00–7.60)
BKR WAM BASOPHIL ABSOLUTE COUNT.: 0.03 x 1000/ÂµL (ref 0.00–1.00)
BKR WAM BASOPHILS: 0.3 % (ref 0.0–1.4)
BKR WAM EOSINOPHIL ABSOLUTE COUNT.: 0.26 x 1000/ÂµL (ref 0.00–1.00)
BKR WAM EOSINOPHILS: 2.4 % (ref 0.0–5.0)
BKR WAM HEMATOCRIT: 26.6 % — ABNORMAL LOW (ref 38.50–50.00)
BKR WAM HEMOGLOBIN: 8.1 g/dL — ABNORMAL LOW (ref 13.2–17.1)
BKR WAM IMMATURE GRANULOCYTES: 0.6 % (ref 0.0–1.0)
BKR WAM LYMPHOCYTES: 9.4 % — ABNORMAL LOW (ref 17.0–50.0)
BKR WAM MCH: 22.4 pg — ABNORMAL LOW (ref 27.0–33.0)
BKR WAM MCHC: 30.5 g/dL — ABNORMAL LOW (ref 31.0–36.0)
BKR WAM MCV: 73.5 fL — ABNORMAL LOW (ref 80.0–100.0)
BKR WAM MONOCYTE ABSOLUTE COUNT.: 0.91 x 1000/ÂµL (ref 0.00–1.00)
BKR WAM MONOCYTES: 8.5 % (ref 4.0–12.0)
BKR WAM MPV: 9.4 fL (ref 8.0–12.0)
BKR WAM NEUTROPHILS: 78.8 % — ABNORMAL HIGH (ref 39.0–72.0)
BKR WAM NUCLEATED RED BLOOD CELLS: 0 % (ref 0.0–1.0)
BKR WAM PLATELETS: 261 x1000/ÂµL (ref 150–420)
BKR WAM RDW-CV: 17.8 % — ABNORMAL HIGH (ref 11.0–15.0)
BKR WAM RED BLOOD CELL COUNT.: 3.62 M/ÂµL — ABNORMAL LOW (ref 4.00–6.00)
BKR WAM WHITE BLOOD CELL COUNT: 10.8 x1000/ÂµL (ref 4.0–11.0)

## 2023-07-21 LAB — BASIC METABOLIC PANEL
BKR ANION GAP: 18 — ABNORMAL HIGH (ref 7–17)
BKR BLOOD UREA NITROGEN: 56 mg/dL — ABNORMAL HIGH (ref 8–23)
BKR BUN / CREAT RATIO: 14.4 (ref 8.0–23.0)
BKR CALCIUM: 8.5 mg/dL — ABNORMAL LOW (ref 8.8–10.2)
BKR CHLORIDE: 100 mmol/L (ref 98–107)
BKR CO2: 17 mmol/L — ABNORMAL LOW (ref 20–30)
BKR CREATININE DELTA: 1 — ABNORMAL HIGH
BKR CREATININE: 3.9 mg/dL — ABNORMAL HIGH (ref 0.40–1.30)
BKR EGFR, CREATININE (CKD-EPI 2021): 13 mL/min/{1.73_m2} — ABNORMAL LOW (ref >=60)
BKR GLUCOSE: 135 mg/dL — ABNORMAL HIGH (ref 70–100)
BKR POTASSIUM: 4.6 mmol/L (ref 3.3–5.3)
BKR SODIUM: 135 mmol/L — ABNORMAL LOW (ref 136–144)

## 2023-07-21 MED ORDER — MORPHINE 4 MG/ML INTRAVENOUS SOLUTION
4 | Freq: Once | INTRAVENOUS | Status: CP
Start: 2023-07-21 — End: ?
  Administered 2023-07-21: 23:00:00 4 mL via INTRAVENOUS

## 2023-07-22 LAB — CBC WITH AUTO DIFFERENTIAL
BKR WAM ABSOLUTE IMMATURE GRANULOCYTES.: 0.05 x 1000/ÂµL (ref 0.00–0.30)
BKR WAM ABSOLUTE LYMPHOCYTE COUNT.: 1.08 x 1000/ÂµL (ref 0.60–3.70)
BKR WAM ABSOLUTE NRBC: 0 x 1000/ÂµL (ref 0.00–1.00)
BKR WAM ANC (ABSOLUTE NEUTROPHIL COUNT): 7.53 x 1000/ÂµL (ref 2.00–7.60)
BKR WAM BASOPHIL ABSOLUTE COUNT.: 0.04 x 1000/ÂµL (ref 0.00–1.00)
BKR WAM BASOPHILS: 0.4 % (ref 0.0–1.4)
BKR WAM EOSINOPHIL ABSOLUTE COUNT.: 0.15 x 1000/ÂµL (ref 0.00–1.00)
BKR WAM EOSINOPHILS: 1.5 % (ref 0.0–5.0)
BKR WAM HEMATOCRIT: 24 % — ABNORMAL LOW (ref 38.50–50.00)
BKR WAM HEMOGLOBIN: 7.4 g/dL — ABNORMAL LOW (ref 13.2–17.1)
BKR WAM IMMATURE GRANULOCYTES: 0.5 % (ref 0.0–1.0)
BKR WAM LYMPHOCYTES: 11 % — ABNORMAL LOW (ref 17.0–50.0)
BKR WAM MCH: 22.8 pg — ABNORMAL LOW (ref 27.0–33.0)
BKR WAM MCHC: 30.8 g/dL — ABNORMAL LOW (ref 31.0–36.0)
BKR WAM MCV: 74.1 fL — ABNORMAL LOW (ref 80.0–100.0)
BKR WAM MONOCYTE ABSOLUTE COUNT.: 0.94 x 1000/ÂµL (ref 0.00–1.00)
BKR WAM MONOCYTES: 9.6 % (ref 4.0–12.0)
BKR WAM MPV: 8.8 fL (ref 8.0–12.0)
BKR WAM NEUTROPHILS: 77 % — ABNORMAL HIGH (ref 39.0–72.0)
BKR WAM NUCLEATED RED BLOOD CELLS: 0 % (ref 0.0–1.0)
BKR WAM PLATELETS: 209 x1000/ÂµL (ref 150–420)
BKR WAM RDW-CV: 17.9 % — ABNORMAL HIGH (ref 11.0–15.0)
BKR WAM RED BLOOD CELL COUNT.: 3.24 M/ÂµL — ABNORMAL LOW (ref 4.00–6.00)
BKR WAM WHITE BLOOD CELL COUNT: 9.8 x1000/ÂµL (ref 4.0–11.0)

## 2023-07-22 LAB — VITAMIN B12: BKR VITAMIN B12: 921 pg/mL (ref 232–1245)

## 2023-07-22 LAB — RETICULOCYTES
BKR WAM IRF: 19.5 % — ABNORMAL HIGH (ref 3.0–15.9)
BKR WAM RETICULOCYTE - ABS (3 DEC): 0.043 10Ë6 cells/uL (ref 0.023–0.140)
BKR WAM RETICULOCYTE COUNT PCT (1 DEC): 1.2 % (ref 0.6–2.7)
BKR WAM RETICULOCYTE HGB EQUIVALENT: 26.1 pg — ABNORMAL LOW (ref 28.2–35.7)

## 2023-07-22 LAB — MAGNESIUM
BKR MAGNESIUM: 2 mg/dL (ref 1.7–2.4)
BKR MAGNESIUM: 2 mg/dL (ref 1.7–2.4)

## 2023-07-22 LAB — PT/INR AND PTT (BH GH L LMW YH)
BKR INR: 1.16 — ABNORMAL HIGH (ref 0.87–1.13)
BKR PARTIAL THROMBOPLASTIN TIME: 31.6 s — ABNORMAL HIGH (ref 23.0–31.0)
BKR PROTHROMBIN TIME: 12.4 s — ABNORMAL HIGH (ref 9.5–12.1)

## 2023-07-22 LAB — HEPATIC FUNCTION PANEL
BKR A/G RATIO: 0.9 — ABNORMAL LOW (ref 1.0–2.2)
BKR ALANINE AMINOTRANSFERASE (ALT): 14 U/L (ref 9–59)
BKR ALBUMIN: 3.2 g/dL — ABNORMAL LOW (ref 3.6–5.1)
BKR ALKALINE PHOSPHATASE: 193 U/L — ABNORMAL HIGH (ref 9–122)
BKR ASPARTATE AMINOTRANSFERASE (AST): 83 U/L — ABNORMAL HIGH (ref 10–35)
BKR AST/ALT RATIO: 5.9
BKR BILIRUBIN DIRECT: 0.1 mg/dL (ref <=0.2)
BKR BILIRUBIN TOTAL: 0.3 mg/dL (ref <=1.2)
BKR GLOBULIN: 3.4 g/dL (ref 2.0–3.9)
BKR PROTEIN TOTAL: 6.6 g/dL (ref 5.9–8.3)

## 2023-07-22 LAB — RESPIRATORY VIRUS PCR PANEL     (BH GH LMW Q YH)
BKR ADENOVIRUS: NEGATIVE
BKR CORONAVIRUS 229E: NEGATIVE
BKR CORONAVIRUS HKU1: NEGATIVE
BKR CORONAVIRUS NL63: NEGATIVE
BKR CORONAVIRUS OC43: NEGATIVE
BKR HUMAN METAPNEUMOVIRUS (HMPV): NEGATIVE
BKR INFLUENZA A: NEGATIVE
BKR INFLUENZA B: NEGATIVE
BKR PARAINFLUENZA VIRUS 1: NEGATIVE
BKR PARAINFLUENZA VIRUS 2: NEGATIVE
BKR PARAINFLUENZA VIRUS 3: NEGATIVE
BKR PARAINFLUENZA VIRUS 4: NEGATIVE
BKR RESPIRATORY SYNCYTIAL VIRUS: NEGATIVE
BKR RHINOVIRUS: NEGATIVE
BKR SARS-COV-2 RNA (COVID-19) (YH): NEGATIVE

## 2023-07-22 LAB — URINE MICROSCOPIC     (BH GH LMW YH)

## 2023-07-22 LAB — LIVER FUNCTION TESTS     (YH)
BKR ALANINE AMINOTRANSFERASE (ALT): 14 U/L (ref 9–59)
BKR ALKALINE PHOSPHATASE: 167 U/L — ABNORMAL HIGH (ref 9–122)
BKR ASPARTATE AMINOTRANSFERASE (AST): 79 U/L — ABNORMAL HIGH (ref 10–35)
BKR AST/ALT RATIO: 5.6
BKR BILIRUBIN DIRECT: 0.1 mg/dL (ref <=0.2)
BKR BILIRUBIN TOTAL: 0.4 mg/dL (ref <=1.2)

## 2023-07-22 LAB — URINALYSIS WITH CULTURE REFLEX      (BH LMW YH)
BKR BILIRUBIN, UA: NEGATIVE
BKR GLUCOSE, UA: NEGATIVE
BKR KETONES, UA: NEGATIVE
BKR NITRITE, UA: NEGATIVE
BKR PH, UA: 8 — ABNORMAL HIGH (ref 5.5–7.5)
BKR SPECIFIC GRAVITY, UA: 1.011 (ref 1.005–1.030)
BKR UROBILINOGEN, UA: <2 mg/dL (ref <=2.0)

## 2023-07-22 LAB — FERRITIN: BKR FERRITIN: 1184 ng/mL — ABNORMAL HIGH (ref 30–400)

## 2023-07-22 LAB — BASIC METABOLIC PANEL
BKR ANION GAP: 14 (ref 7–17)
BKR BLOOD UREA NITROGEN: 54 mg/dL — ABNORMAL HIGH (ref 8–23)
BKR BUN / CREAT RATIO: 13.8 (ref 8.0–23.0)
BKR CALCIUM: 8.2 mg/dL — ABNORMAL LOW (ref 8.8–10.2)
BKR CHLORIDE: 105 mmol/L (ref 98–107)
BKR CO2: 18 mmol/L — ABNORMAL LOW (ref 20–30)
BKR CREATININE DELTA: 0
BKR CREATININE: 3.9 mg/dL — ABNORMAL HIGH (ref 0.40–1.30)
BKR EGFR, CREATININE (CKD-EPI 2021): 13 mL/min/{1.73_m2} — ABNORMAL LOW (ref >=60)
BKR GLUCOSE: 129 mg/dL — ABNORMAL HIGH (ref 70–100)
BKR POTASSIUM: 4.6 mmol/L (ref 3.3–5.3)
BKR SODIUM: 137 mmol/L (ref 136–144)

## 2023-07-22 LAB — MRSA SCREEN BY PCR: BKR MRSA COLONIZATION STATUS PCR: DETECTED — AB

## 2023-07-22 LAB — IRON AND TIBC
BKR IRON SATURATION: 25 % (ref 15–50)
BKR IRON: 44 ug/dL — ABNORMAL LOW (ref 59–158)
BKR TOTAL IRON BINDING CAPACITY: 179 ug/dL — ABNORMAL LOW (ref 250–450)

## 2023-07-22 LAB — HAPTOGLOBIN: BKR HAPTOGLOBIN: 252 mg/dL — ABNORMAL HIGH (ref 30–200)

## 2023-07-22 LAB — UA REFLEX CULTURE

## 2023-07-22 LAB — UREA NITROGEN, URINE, RANDOM     (BH GH LMW Q YH): BKR UREA NITROGEN, URINE, RANDOM: 389 mg/dL

## 2023-07-22 LAB — SODIUM, URINE, RANDOM W/O CREATININE: BKR SODIUM, URINE RANDOM: 44 mmol/L

## 2023-07-22 LAB — PHOSPHORUS     (BH GH L LMW YH)
BKR PHOSPHORUS: 2.4 mg/dL (ref 2.2–4.5)
BKR PHOSPHORUS: 3 mg/dL (ref 2.2–4.5)

## 2023-07-22 LAB — LACTATE DEHYDROGENASE: BKR LACTATE DEHYDROGENASE: 433 U/L — ABNORMAL HIGH (ref 122–241)

## 2023-07-22 LAB — LACTIC ACID, PLASMA (REFLEX 2H REPEAT): BKR LACTATE: 1.3 mmol/L (ref 0.4–2.0)

## 2023-07-22 LAB — OSMOLALITY: BKR OSMOLALITY: 304 mosm/kg — ABNORMAL HIGH (ref 275–300)

## 2023-07-22 LAB — OSMOLALITY, URINE, RANDOM: BKR OSMOLALITY, URINE, RANDOM: 327 mosm/kg (ref 300–900)

## 2023-07-22 LAB — LEGIONELLA AND S. PNEUMONIAE ANTIGEN, URINE     (BH GH LMW YH)
BKR LEGIONELLA ANTIGEN: NEGATIVE
BKR S. PNEUMONIAE URINE ANTIGEN: POSITIVE — AB

## 2023-07-22 LAB — PROCALCITONIN     (BH GH LMW Q YH): BKR PROCALCITONIN: 0.21 ng/mL

## 2023-07-22 LAB — CREATININE, URINE, RANDOM: BKR CREATININE, URINE, RANDOM: 102 mg/dL

## 2023-07-22 LAB — POTASSIUM, URINE, RANDOM     (BH GH L LMW YH): BKR POTASSIUM, URINE, RANDOM: 34 mmol/L

## 2023-07-22 MED ORDER — FAMOTIDINE 20 MG TABLET
20 | Freq: Every day | ORAL | Status: CP
Start: 2023-07-22 — End: ?
  Administered 2023-07-22 – 2023-08-03 (×13): 20 mg via ORAL

## 2023-07-22 MED ORDER — MIDODRINE 2.5 MG TABLET
2.5 | Freq: Three times a day (TID) | ORAL | Status: CP
Start: 2023-07-22 — End: ?
  Administered 2023-07-22 – 2023-07-26 (×10): 2.5 mg via ORAL

## 2023-07-22 MED ORDER — LIDOCAINE 2 % MUCOSAL JELLY IN APPLICATOR
2 | Freq: Once | URETHRAL | Status: CP
Start: 2023-07-22 — End: ?
  Administered 2023-07-22: 01:00:00 2 mL via URETHRAL

## 2023-07-22 MED ORDER — SODIUM CHLORIDE 0.9 % IV BOLUS NEW BAG (DROPS CHARGE)
0.9 | Freq: Once | INTRAVENOUS | Status: CP
Start: 2023-07-22 — End: ?
  Administered 2023-07-22: 05:00:00 0.9 mL/h via INTRAVENOUS

## 2023-07-22 MED ORDER — MORPHINE 4 MG/ML INTRAVENOUS SOLUTION
4 | Freq: Once | INTRAVENOUS | Status: CP
Start: 2023-07-22 — End: ?
  Administered 2023-07-22: 01:00:00 4 mL via INTRAVENOUS

## 2023-07-22 MED ORDER — SODIUM CHLORIDE 0.9 % IV BOLUS NEW BAG (DROPS CHARGE)
0.9 | Freq: Once | INTRAVENOUS | Status: CP
Start: 2023-07-22 — End: ?
  Administered 2023-07-22: 12:00:00 0.9 mL/h via INTRAVENOUS

## 2023-07-22 MED ORDER — VANCOMYCIN THERAPY PLACEHOLDER
INTRAVENOUS | Status: DC
Start: 2023-07-22 — End: 2023-07-22

## 2023-07-22 MED ORDER — CAMPHOR-MENTHOL 0.5 %-0.5 % LOTION
0.5-0.5 | Freq: Two times a day (BID) | TOPICAL | Status: CP | PRN
Start: 2023-07-22 — End: ?
  Administered 2023-07-24 – 2023-07-31 (×2): 0.5-0.5 mL via TOPICAL

## 2023-07-22 MED ORDER — VANCOMYCIN 1 G IN 250 ML IVPB (VIALMATE)
Freq: Once | INTRAVENOUS | Status: DC
Start: 2023-07-22 — End: 2023-07-22
  Administered 2023-07-22: 08:00:00 250.000 mL/h via INTRAVENOUS

## 2023-07-22 MED ORDER — ESCITALOPRAM 10 MG TABLET
10 | Freq: Every day | ORAL | Status: CP
Start: 2023-07-22 — End: ?
  Administered 2023-07-22 – 2023-08-03 (×13): 10 mg via ORAL

## 2023-07-22 MED ORDER — SENNOSIDES 8.6 MG TABLET
8.6 | Freq: Every evening | ORAL | Status: DC
Start: 2023-07-22 — End: 2023-07-23
  Administered 2023-07-22: 20:00:00 8.6 mg via ORAL

## 2023-07-22 MED ORDER — ETHYL ALCOHOL 62 % TOPICAL SWAB
62 | Freq: Two times a day (BID) | NASAL | Status: AC
Start: 2023-07-22 — End: ?
  Administered 2023-07-27 – 2023-08-03 (×14): 62 % via NASAL

## 2023-07-22 MED ORDER — HEPARIN (PORCINE) 5,000 UNIT/ML INJECTION SOLUTION
5000 | Freq: Three times a day (TID) | SUBCUTANEOUS | Status: CP
Start: 2023-07-22 — End: ?
  Administered 2023-07-22 – 2023-08-03 (×37): 5000 mL via SUBCUTANEOUS

## 2023-07-22 MED ORDER — POLYETHYLENE GLYCOL 3350 17 GRAM ORAL POWDER PACKET
17 | Freq: Every day | ORAL | Status: DC
Start: 2023-07-22 — End: 2023-07-23
  Administered 2023-07-22 – 2023-07-23 (×2): 17 gram via ORAL

## 2023-07-22 MED ORDER — ROSUVASTATIN 40 MG TABLET
40 | Freq: Every day | ORAL | Status: CP
Start: 2023-07-22 — End: ?
  Administered 2023-07-22 – 2023-08-03 (×13): 40 mg via ORAL

## 2023-07-22 MED ORDER — SODIUM CHLORIDE 0.9 % (FLUSH) INJECTION SYRINGE
0.9 | INTRAVENOUS | Status: AC | PRN
Start: 2023-07-22 — End: ?

## 2023-07-22 MED ORDER — ACETAMINOPHEN 500 MG TABLET
500 | Freq: Three times a day (TID) | ORAL | Status: DC | PRN
Start: 2023-07-22 — End: 2023-07-24
  Administered 2023-07-22 – 2023-07-23 (×3): 500 mg via ORAL

## 2023-07-22 MED ORDER — VANCOMYCIN MAR LEVEL
Freq: Once | INTRAVENOUS | Status: DC
Start: 2023-07-22 — End: 2023-07-22

## 2023-07-22 MED ORDER — VANCOMYCIN 1 G IN 250 ML IVPB (VIALMATE)
INTRAVENOUS | Status: DC
Start: 2023-07-22 — End: 2023-07-22

## 2023-07-22 MED ORDER — CHLORHEXIDINE GLUCONATE 4 % TOPICAL LIQUID
4 | Freq: Every day | TOPICAL | Status: AC
Start: 2023-07-22 — End: ?
  Administered 2023-07-22 – 2023-08-03 (×12): 4 mL via TOPICAL

## 2023-07-22 MED ORDER — CEFTRIAXONE IV PUSH 1000 MG VIAL & NS (ADULTS)
INTRAVENOUS | Status: DC
Start: 2023-07-22 — End: 2023-07-22

## 2023-07-22 MED ORDER — CLOPIDOGREL 75 MG TABLET
75 | Freq: Every day | ORAL | Status: CP
Start: 2023-07-22 — End: ?
  Administered 2023-07-22 – 2023-08-03 (×13): 75 mg via ORAL

## 2023-07-22 MED ORDER — PIPERACILLIN-TAZOBACTAM (ZOSYN) 2.25GM MBP
Freq: Four times a day (QID) | INTRAVENOUS | Status: DC
Start: 2023-07-22 — End: 2023-07-23
  Administered 2023-07-22 – 2023-07-23 (×5): 50.000 mL/h via INTRAVENOUS

## 2023-07-22 MED ORDER — CEFTRIAXONE IV PUSH 1000 MG VIAL & NS (ADULTS)
Freq: Once | INTRAVENOUS | Status: CP
Start: 2023-07-22 — End: ?
  Administered 2023-07-22: 02:00:00 10.000 mL via INTRAVENOUS

## 2023-07-22 MED ORDER — SODIUM CHLORIDE 0.9 % IV BOLUS NEW BAG (DROPS CHARGE)
0.9 | Freq: Once | INTRAVENOUS | Status: CP
Start: 2023-07-22 — End: ?
  Administered 2023-07-22: 02:00:00 0.9 mL/h via INTRAVENOUS

## 2023-07-22 MED ORDER — MUPIROCIN 2 % TOPICAL OINTMENT
2 | Freq: Two times a day (BID) | TOPICAL | Status: CP
Start: 2023-07-22 — End: ?
  Administered 2023-07-22 – 2023-07-27 (×10): 2 % via TOPICAL

## 2023-07-22 MED ORDER — LIDOCAINE 4 % TOPICAL PATCH
4 | TRANSDERMAL | Status: CP
Start: 2023-07-22 — End: ?

## 2023-07-22 MED ORDER — SODIUM CHLORIDE 0.9 % (FLUSH) INJECTION SYRINGE
0.9 | Freq: Three times a day (TID) | INTRAVENOUS | Status: AC
Start: 2023-07-22 — End: ?
  Administered 2023-07-22 – 2023-07-23 (×4): 0.9 mL via INTRAVENOUS

## 2023-07-22 MED ORDER — MELATONIN 3 MG TABLET
3 | Freq: Every evening | ORAL | Status: DC
Start: 2023-07-22 — End: 2023-07-24
  Administered 2023-07-22 – 2023-07-23 (×2): 3 mg via ORAL

## 2023-07-22 MED ORDER — ACETAMINOPHEN 325 MG TABLET
325 | Freq: Once | ORAL | Status: CP
Start: 2023-07-22 — End: ?
  Administered 2023-07-22: 02:00:00 325 mg via ORAL

## 2023-07-22 MED ORDER — GABAPENTIN 100 MG CAPSULE
100 | Freq: Every evening | ORAL | Status: CP
Start: 2023-07-22 — End: ?
  Administered 2023-07-22 – 2023-08-02 (×12): 100 mg via ORAL

## 2023-07-22 MED ORDER — ASPIRIN 81 MG TABLET,DELAYED RELEASE
81 | Freq: Every day | ORAL | Status: CP
Start: 2023-07-22 — End: ?
  Administered 2023-07-22 – 2023-08-03 (×13): 81 mg via ORAL

## 2023-07-22 MED ORDER — SODIUM BICARBONATE 650 MG TABLET
650 | Freq: Three times a day (TID) | ORAL | Status: CP
Start: 2023-07-22 — End: ?
  Administered 2023-07-22 – 2023-08-03 (×38): 650 mg via ORAL

## 2023-07-22 MED ORDER — MIDODRINE 2.5 MG TABLET
2.5 | Freq: Three times a day (TID) | ORAL | Status: DC | PRN
Start: 2023-07-22 — End: 2023-07-22

## 2023-07-22 NOTE — Plan of Care
 Plan of Care Overview/ Patient StatusPatient Ennio Houp is a 89 y.o. male. AOx4, HOH. VSS on RA. Patient denies chest pain or SOB. Endorses L hip pain managed with PRN tylenol . IV abx continued per orders.Abdomen soft, nontender, +bowel sounds. No BM this shift. Chronic foley cath in place draining yellow urine, catheter care provided. Tolerating renal diet, tolerates pills whole with water . Turn and repositioning provided, skin intact. Safety maintained, bed locked and low, alarm on. Call bell within reach, patient calling appropriately.BP 121/62  - Pulse 84  - Temp 98.2 ?F (36.8 ?C) (Oral)  - Resp 20  - Ht 5' 11 (1.803 m)  - Wt 82 kg  - SpO2 98%  - BMI 25.21 kg/m? Arie Powell, RN5/24/202510:58 PM

## 2023-07-22 NOTE — Other
 RAPID RESPONSE NURSING SUMMARYPt: Mark [name], Mark [name] - Age: 89 y.o. Sex: male DOB: 06-03-1925 - MRN: [id number] LOC: SLA3/L367-01 - Admit Date: 07/21/2023 Attd: Ballard Bongo, MD  PCP: Sherren Doe, MD, [phone] Code Status: Full Code - Allergies: Ace inhibitorsRRT CALLED AT 5:26aFOR: Arrhthymias	[] 		Hypotension	[x] 		Hypertension	[] 		Hypoglycemia	[] 	Respiratory Decompensation	[] 		Mental Status	[] 		Other	[] 						Events: RRT called for hypotension and need for further IV access. A+Ox3, forgetful to time. [place]epak applied, SR 80s on monitor. Labs drawn. NS 500cc bolus given and BP improved. Vitals:Temperature: 98.6 ?F (37 ?C)Pulse: 87Respirations: 20Blood Pressure: (!) 97/51Oxygen Saturation: 95 %Labs  Chemistry  Lab Results Component Value Date  NA 135 (L) 07/21/2023  K 4.6 07/21/2023  CL 100 07/21/2023  CO2 17 (L) 07/21/2023  BUN 56 (H) 07/21/2023  CREATININE 3.90 (H) 07/21/2023  GLU 137 (H) 07/22/2023  Lab Results Component Value Date  CALCIUM  8.5 (L) 07/21/2023  ALKPHOS 167 (H) 07/21/2023  AST 79 (H) 07/21/2023  ALT 14 07/21/2023  BILITOT 0.4 07/21/2023   Lab Results Component Value Date  WBC 10.8 07/21/2023  HGB 8.1 (L) 07/21/2023  PLT 261 07/21/2023  MCV 73.5 (L) 07/21/2023 Disposition	Stayed in Room		[x] 	Transfer to Tele		[] 	Transfer to SDU		[] 		Transfer to ICU		[] 		Transported to Procedure	[] 	This report is electronically signed by: Reather Campbell, RN on 07/22/23 at 5:50 AM.Can be reached by [facility]

## 2023-07-22 NOTE — Other
 Had a conversation with Mark [name], daughter of Mark [name]. We discussed code status. [name] shared that Mark [name] had written an advanced directive, yet to be notorized saying that he would want full cardiopulmonary resuscitative interventions unless if in the setting of a terminal condition requiring prolonged aritficial life-sustaining therapies. Mark [name] feels that Mark [name] does not currently appreciate the probability of successful revival or the degree of suffering during such an intervention. She does not want to see him unnecessarily suffer but at the same time does not want to go against his wishes. For now we will keep code status as full. We will engage Mark [name] with his daughter in the next day or two pending improvement in his mental status to discuss goals of care further and ensure he is providing an informed choice.Mark Provost, MD, MPHInternal Medicine and Pediatrics, PGY-4[facility] HospitalContact on MHBFor further details see attending addendum

## 2023-07-22 NOTE — Utilization Review (ED)
 Utilization Review from XsolisPatient Data:  Patient Name: Mark [name] Age: 89 y.o. DOB: Jul 21, 1925	 MRN: [id number]	 Indicated Status: Inpatient Review Comments: S/p fall, with left hip injury, labs with drop in hemoglobin and AKI, UTI. Antibiotics, fluids and morphine given. Admitted for further management. Care management consult due to frequent falls.

## 2023-07-22 NOTE — Plan of Care
 Plan of Care Overview/ Patient StatusPatient is A&OX2-3. Heart sounds s1, s2 heard, BP remains soft. Lung sounds slightly diminished and bowel sounds (+) in all 4 quadrants. Foley in place and draining yellow urine, with some blood noted. Good po and fluid intake, IV antibiotics continued. Meds per [facility], care per POC and call light within reach.Carles Cheadle, RN

## 2023-07-22 NOTE — ACP (Advance Care Planning)
 Advanced Care Planning NoteI had spoken to Calvert Caul daughter, Amalia Badder, today about code status. Patient Mr. Sisney was Aox2 without understanding of current situation, so code status was discussed with his daughter. She believes he would want to be full code for now, but shared some distress at making this decision. Agree to keep patient full code at this time but continue discussion later in the day. At present, with respect to wishes in the event of cardiopulmonary arrest, Arvind Kilpatrick would like the following interventions:DO provide intubation and mechanical ventilationDO provide chest compressionsWe had also discussed additional non-code limitations. In the event of hypoxia, hypotension, or hypercarbia, Marthann Slade would like the following interventions:DO provide NIPPV/BiPAPDO provide pressor supportDO transfer to higher levels of care. In summary, Delmo Matty is FULL CODE, with no additional non-code limitations. Signed:Staria Birkhead, MDInternal Medicine-Primary CareReachable on MHB05/24/255:37 AM

## 2023-07-22 NOTE — ED Provider Notes
 Chief Complaint Patient presents with  Fall greater than 89 years old   Pt BIBA from home with reports +unwitnessed fall. Per EMS, they presented to pt's home at approx 02:00 this AM for the fall and was found with no injuries or complaints so they got a refusal . Pt then called back tonight reporting pain to L hip, states it worsened throughout the day. Administered 50 mcg Fentanyl en route. Pt states pain is severe to L hip at this time.  HPI: This is a 89 year old male with a history of hypertension, CAD, CKD, BPH, heart failure with preserved EF, AAA, hyperlipidemia who presents with left hip pain after an unwitnessed fall.  Per patient he was trying to get into bed at approximately 2:00 a.m. on 5/23 when he fell.  EMS was initially called but patient refused given that he had no acute complaints at that time.  He then developed worsening left hip pain over the course of the day.  He was able to walk but states it is extremely painful.  He denies hitting his head, anticoagulation, other injuries or other pain.MDM This is a 89 year old male with complex medical history who presents with an unwitnessed fall resulting in left hip pain differential includes left hip fracture, contusion, spinal fracture, pelvic fracture or intra abdominal or intrathoracic injury.  Also consider possible ICH.  Patient has an overall reassuring neuro exam.  Slightly decreased range of motion of the left hip secondary to pain.  Underlying cause of fall sounds mechanical.  However given his age and history also considered causes such as infectious etiology, electrolyte abnormality, anemia as things that could cause weakness and increased susceptibility to falls.An acute or life threatening problem was adressed in this evaluationThe following social determinants of health including difficulty accessing medical care at this time contributed to the patient's current presentation.I considered admission or ED OBS admission for your management and safety planning, pain control. Physical ExamED Triage Vitals [07/21/23 2220]BP: (!) 129/59Pulse: 86Pulse from  O2 sat: n/aResp: (!) 22Temp: 97.6 ?F (36.4 ?C)Temp src: OralSpO2: 96 % BP (!) 97/51  - Pulse 87  - Temp 98.6 ?F (37 ?C) (Oral)  - Resp 20  - Wt 82 kg  - SpO2 95%  - BMI 25.21 kg/m? Physical ExamVitals and nursing note reviewed. Constitutional:     General: He is not in acute distress.   Appearance: Normal appearance. HENT:    Head: Normocephalic and atraumatic.    Mouth/Throat:    Mouth: Mucous membranes are moist. Eyes:    Conjunctiva/sclera: Conjunctivae normal. Cardiovascular:    Rate and Rhythm: Normal rate and regular rhythm.    Pulses: Normal pulses. Pulmonary:    Effort: Pulmonary effort is normal. Abdominal:    General: Abdomen is flat.    Palpations: Abdomen is soft.    Tenderness: There is no abdominal tenderness. Musculoskeletal:       General: Normal range of motion.    Cervical back: Normal range of motion and neck supple. Skin:   General: Skin is warm and dry. Neurological:    General: No focal deficit present.    Mental Status: He is alert and oriented to person, place, and time. Psychiatric:       Mood and Affect: Mood normal.       Behavior: Behavior normal.       Thought Content: Thought content normal.       Judgment: Judgment normal.  ProceduresAttestation/Critical CareComments as of 07/22/23 0628 Sat Jul 22, 2023 0008 Hemoglobin  down from 9 0.6-8.1.  Mild AKI with a creatinine of 3.9 from 2.9.  Anion gap of 18. [LD] 0120 [place]: 1. No evidence of acute intracranial abnormality.  2. No evidence for acute cervical spine fracture or traumatic subluxation within the limitations of motion degradation.1. No evidence of acute traumatic injury to the chest, abdomen, or pelvis.2. No evidence of acute traumatic injury to the thoracic or lumbar spine.3. 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 Moderate to severe bilateral hydroureteronephrosis is not significantly changed.5. Nonspecific stable pelvic side wall lymph nodes measuring up to 1.3 cm may be reactive in the setting of acute cystitis.6. Stable infrarenal abdominal aortic aneurysm measuring up to 5.2 cm in diameter. [LD] 0121 Given frequent falls and mild drop in hemoglobin, AKI patient will be admitted to medicine for further evaluation and treatment and care management planning. [LD] [phone] Patient now febrile.  UA sent for evaluation.  However given [place] with findings concerning for cystitis and fever we will treat for UTI.  [LD] 0210 UA consistent with UTI [LD]  Comments User Index[LD] Yevonne Yokum, MD PhD   Clinical Impressions as of 07/22/23 [phone] Acute kidney injury [facility] Code)  ED DispositionAdmit  Jolynn Bajorek, MD PhD05/24/25 [phone]

## 2023-07-22 NOTE — Other
 [facility] [place] [facility] [facility] Hospital-SrcRapid Response Team (RRT) NotePrimary Team: medicineAttending Provider: Ballard Bongo, MD [phone] Provider notified: yesReason for RRT activation: hypotensionCalled By: Albert Almas Score: Risk Score: 98 Clinical History: 89 yo man w/ HFpEF (EF 65% 05/10/23), CAD, AAA, HTN, BPH w/ chronic foley (since at least 2024), orthostatic hypotension on midodrine  who presented on 5/23 from his nursing facility with hip pain after a fall. Pt admitted for possible UTI related sepsis. RRT called for hypotension to 85-90s systolic. Pt was fluid responsive and BP improved with 500 cc bolus. Abx broadened. Good PIVs placed x2. Cultures in process. Vitals:  07/22/23 0655 BP: (!) 94/50 Pulse: 88 Resp: 20 Temp: 98.3 ?F (36.8 ?C)  Patient Disposition: Remain on current unitFamily Contacted:  defer to primary teamGoals of Care Discussed:noPrachi Juanjose Mojica, MD, MPH5/24/20257:19 AMCritical Care Attestation: The patient has required 30 minutes of my undivided attention for one of the previously noted life threatening diagnoses. This time is exclusive of the time devoted to procedures.

## 2023-07-22 NOTE — Plan of Care
 Assessment and Plan: updated post rounds per same day H&P# multiple organ dysfunction syndrome# Fever, Leukocytosis, hypotensionUTIs most likely source of infection considering [place] abdomen findings as well as UA.  Patient has chronic Foley which also predispose patient for colonization vs infection.  Of note, patient denies dysuria.Previous urine culture grew >100 K CFU Klebsiella susceptible to CTX (06/09/23).CXR and [place] chest without signs of pneumonia.  Blood pressure responds well to IVFs.- s/p 1L bolus, will order another now - follow up urine cx- dc vanc, c/w zosyn  iso hypotension pending cx - f/u blood cultures- f/u RVP- lactate nl - resume home midodrine  2.5 mg TID but will make scheduled and not prn  # fallPatient does not recall the events leading up to admission.  We will need more collateral to determine etiology of fall.  Considered cardiac with history of orthostatic hypotension on midodrine  p.r.n..  Notably EKG was in sinus rhythm.  as well as vasovagal, or mechanical.  Reassuring the x-ray did not show any fracture and that patient is no longer endorsing pain, however imaging was limited due to bowel gas, would consider getting repeat imaging if pain does not improve.  No bruising noted on exam.S/p fentanyl in EMS and morphine IV 4mg  x2 in ED. - discuss events leading up to the fall further with patient's daughter during the day- consider repeat pelvic x-ray if pain worsens- added tylenol  1000 prn- PT/OT  # AKI on CKD# chronic urinary retention requiring Foley# chronic hydroureteronephrosisReceived 1L NS overnight.  Foley changed in ED on arrival.- additional 1L bolus now - urine lytes- daily BMP- cont home sodium bicarb # Other hospital issues- CAD: cont home statin, will look into why on DAPT (ASA + plavix ) - mood: cont escitalopram - neuropathy: cont home gabapentin - GERD: change home omeprazole  for pepcid , should NOT restart omeprazole  on discharge as it decreases effects of plavix  (assuming pt still needs DAPT)- constipation: stool burden noted on  - miralax  daily, senna nightly Code status:  in discussion with daughter, pt will remain full code DVT ppx: heparin  Diet: Renal Lines: 2 PIV Signed: Leilani Punter, MDPGY-2 [facility]

## 2023-07-22 NOTE — ED Notes
 10:27 PM BIBA from home for c/o unwitnessed fall, no head strike, no LOC, +thinners. Patient states he slid out of bed. EMS originally called at 0200AM this morning, no complaints on scene, A/Ox4 refused initial transport to ED. Called EMS again at 2130PM for increased pain. Endorsing 10/10 pain to left hip at this time. Recieved 50mcg fent IM en route, 20g PIV left AC. On arrival A/Ox4 tacypnic, endorsing 10/10 pain to left hip and and SOB. States he typically uses a walker but was unable to walk today due to the pain. Visually shivering on arrival, states he is very cold. Denies CP, abdominal pain, headache, changes to bowel and bladder. Denies fever or sick contacts. No other medical complaints at this time. No obvious rotation or deformity to lower limbs. MD at bedside to assess, VSS.11:05 PM Medicated per MAR, labs sent11:27 PM Continues to endorse 10/10 pain11:50 PM To imaging12:20 AM Back from imaging, continues to endorse pain1:51 AM Medicated per [facility], foley changed, labs sent, Rectal 101.5 F, MD aware.2:19 AM Medicated per [facility]

## 2023-07-22 NOTE — Plan of Care
 Problem: Adult Inpatient Plan of CareGoal: Readiness for Transition of CareOutcome: Interventions implemented as appropriate Plan of Care Overview/ Patient Status14 year old male with a history of hypertension, CAD, CKD, BPH, heart failure with preserved EF, AAA, hyperlipidemia who presents with left hip pain after an unwitnessed fall. Spoke with dgter via phone and informed that pt will be residing with her on first floor condo upon dc. Uses a RW, 2 Stair lift, WC, and RTS. Owns a rollator and transport chair. Active with [facility] at home for Nsg/PT/OT/HHA. Would like for father to go to the [place] if STR is recommended. PASRR approved. Housing / English as a second language teacher / Environment and Abuse screens done by a discipline other than case management and auto populated into Case Management documentation. Case Management/Social Work Geophysicist/field seismologist Most Recent Value Case Management/Social Work Screening: Chart review completed. If YES to any question below then proceed to Eval/Plan  Is there a change in their cognitive function No Do you anticipate that the patient will have any discharge needs requiring CM/SW intervention? Yes Has there been an unscheduled readmission within the last 30 days and/or four (4) encounters (encounters include: ED, OBS, Inpatient) within the last six (6) months? Yes Were there services prior to admission ( Examples: Acute [facility],  Assisted Living, HD, Homecare, Extended Care Facility, Methadone, SNF, Outpatient Infusion Center) Yes Concern for current abuse/neglect/interpersonal violence/sexual assault  No Connected to state agency? No Guardianship or conservatorship No Negative/Positive Screen Negative Screening: Case Management department will follow patient's progress and discuss plan of care with treatment team. Case Manager/Social Work Attestation  I have reviewed the medical record and completed the above screen. CM/SW staff will follow patient's progress and discuss the plan of care with the Treatment Team. Yes Case Management/Social Work Evaluation and Plan  Arrived from prior to admission home/apartment/condo Do you have a caregiver, or do you anticipate the need for a caregiver given the change in your physical function? Yes Permission to Speak to Caregiver? Yes Caregiver name: dgter: Cannen Dupras Caregiver phone number: [phone] As the caregiver, do you think you have the physical ability and willingness to perform direct care and provide assistance with transportation, meal preparation/feeding, acquiring and administering medication:  Yes Day(s) Caregiver available 24/7 Lives with Daughter, Adult Services Prior to Admission home care agency (specify services) Home care agency name: [facility] Home Care Agency Services:  HHA, RN visit, PT, OT Patient Requires Transition of Care Intervention Due To Discharge planning needs/concerns, High recidivism (>4 admission within 6 months) Prior to Hospitalization: Assistance Needed/DME being used Ambulation, Toileting, Bathing Ambulation Assistance/DME: Rollator, Rolling walker, Development worker, international aid lift] Bathing Assistance/DME: --  [walk-in shower] Toileting Assistance/DME: raised toilet seat Documented Insurance Accurate Yes Any financial concerns related to anticipated discharge needs No Patient's home address verified Yes Patient's PCP of record verified Yes Last Date Seen by PCP 0-3 months Source of Clinical History  Patient's clinical history has been reviewed and source of Information is: Daughter Psychologist, forensic Recommendations  Discharge Planning Coordination Recommendations Needs not determined at this time Case Manager/Social Worker reviewed plan of care/ continuum of care need's with  Family Housing / Transportation/ Environment  What is your living situation today? I have a steady place to live Think about the place you live. Do you have problems with any of the following? None In the past 12 months has the electric, gas, oil, or water  company threatened to shut off services in your home?  No Within the past 12 months, you worried that your food would run out before you got the money to buy more. Never true Within the past 12 months, the food you bought just didn't last and you didn't have money to get more. Never true In the past 12 months, has lack of transportation kept you from medical appointments or from getting medications? no In the past 12 months, has lack of transportation kept you from meetings, work, or from getting things needed for daily living? No  Abuse Screen  Is there anyone in your life that is hurting or threatening you in anyway? no Read to patient We know that many of our patients and caregivers are exposed to violence in the home so we have started letting everyone know that [place]  has a safe, confidential and free 24/7 hotline called [place] Safe Connect no education not provided Would it be ok if we add this resource to your DC paperwork? no Abuse Screen (yes response referral indicated)  Able to respond to abuse questions Yes Is there anyone in your life that is hurting or threatening you in anyway? no Physical Indicators of Abuse No evidence of physical abuse Read to patient We know that many of our patients and caregivers are exposed to violence in the home so we have started letting everyone know that [place]  has a safe, confidential and free 24/7 hotline called  Safe Connect no education not provided Would it be ok if we add this resource to your DC paperwork? no   Maple Seltzer RN, [facility] ManagerYale St Joseph'S Hospital

## 2023-07-22 NOTE — H&P
 Inpatient Medicine - History & PhysicalAttending: Ballard Bongo, MD 	History of Present IllnessChief concern: L hip pain s/p fallJames [name] is a 89 yo man w/ HFpEF (EF 65% 05/10/23), CAD, AAA, HTN, BPH w/ chronic foley (since at least 2024), orthostatic hypotension on midodrine  who presented on 5/23 from his nursing facility with hip pain after a fall. Patient does not remember the events leading to admission, history gathered from chart review.  Patient reportedly fell on 05/23 around 2:00 a.m. and EMS was called.  At that time patient was A&O x4 and declined transport to ED. he continued to have ongoing left hip pain and EMS was called again in the evening of 5/23.  In route, he received 50 mcg of fentanyl IM.On arrival to the ED, patient was tachypneic to 22, initially afebrile 97.39F but 101.21F on repeat.  BP 129/59, HR 86, satting 96% on room air. He was also noted to be shivering and endorsed significant left hip pain.Initial labs:  Na 135, K4.6, bicarb 17, anion gap 18, Cr 3.9 (was 2.9 on 04/16), alk-phos 167, ALT 14, AST 79WBCs 10.8, hemoglobin 8.1 (baseline 9)UA cloudy, 2+ protein, negative ketones, 3+ blood, 4+ leukocytes, negative nitrites.CXR unremarkable. [place] head/chest/a/p c/f cystitis, no intracranial bleed. Pelvis xray without fracture. His Foley was exchanged and he was given ceftriaxone  x1 for suspected UTI, cultures pending.  He was also given 500cc of normal saline and IV morphine 4 mg x2.On exam, patient is oriented to self and year, but states he has at home.  Does not remember events leading up to hospitalization.  Denies pain.Around 5:00 a.m., patient was noted to be hypotensive with MAPs in the high 50s to low 60s.  His blood pressure responded with 500 cc NS fluid bolus.  Patient was asymptomatic during this time. ExamBP (!) 97/51  - Pulse 87  - Temp 98.6 ?F (37 ?C) (Oral)  - Resp 20  - Wt 82 kg  - SpO2 95%  - BMI 25.21 kg/m? Physical ExamConstitutional:     General: He is not in acute distress.Eyes:    General: No scleral icterus.   Extraocular Movements: Extraocular movements intact. Cardiovascular:    Rate and Rhythm: Regular rhythm. Tachycardia present.    Heart sounds: Murmur heard. Pulmonary:    Effort: Pulmonary effort is normal.    Comments: No wheezes, rales, rhonchi on anterior fieldsMusculoskeletal:    Right lower leg: No edema.    Left lower leg: No edema. Skin:   General: Skin is warm and dry. Neurological:    Mental Status: He is alert. Psychiatric:       Mood and Affect: Mood normal.       Thought Content: Thought content normal. Pertinent LabsNa 135, K4.6, bicarb 17, anion gap 18, Cr 3.9 (was 2.9 on 04/16), alk-phos 167, ALT 14, AST 79WBCs 10.8 with left shift, hemoglobin 8.1 (baseline 9)UA cloudy, 2+ protein, negative ketones, 3+ blood, 4+ leukocytes, negative nitrites.Haptoglobin 252Iron 44, TIBC 70, sat 25, ferritin 1184, vitamin B12 921Procal 0. 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  No osseous injury.EKG/TeleEKG (5/24):  Sinus rhythm, heart rate 96, QTC 469 Assessment & PlanIn summary, this is a 89 year old gentleman who presented with pain after a fall, found to be febrile with UA  concerning for UTI.# multiple organ dysfunction syndrome# Fever, Leukocytosis, hypotensionUTIs most likely source of infection considering [place] abdomen findings as well as UA.  Patient has chronic Foley which also predispose patient for colonization vs infection.  Of note, patient denies dysuria.Previous urine culture grew >100 K CFU Klebsiella susceptible to CTX (06/09/23).CXR and [place] chest without signs of pneumonia.  Blood pressure responds well to IVFs.- follow up urine cx- broadened to vanc/zosyn  iso hypotension- blood cultures- RVP- lactate- restart home midodrine  2.5 mg TID prn per covering provider discretion# fallPatient does not recall the events leading up to admission.  We will need more collateral to determine etiology of fall.  Considered cardiac with history of orthostatic hypotension on midodrine  p.r.n..  Notably EKG was in sinus rhythm.  as well as vasovagal, or mechanical.  Reassuring the x-ray did not show any fracture and that patient is no longer endorsing pain, however imaging was limited due to bowel gas, would consider getting repeat imaging if pain does not improve.  No bruising noted on exam.S/p fentanyl in EMS and morphine IV 4mg  x2 in ED. - discuss events leading up to the fall further with patient's daughter during the day- consider repeat pelvic x-ray if pain worsens- tylenol  prn# Acute on chronic microcytic AnemiaElevated haptoglobin more consistent with acute phase reactant.  Low iron with high ferritin and low TIBC consistent with anemia of chronic diseaseB12 WNLBaseline around hemoglobin of 9, last was 9.6 on 04/16, on presentation 8.1.  No signs of bleeding on [place] head/chest/a/p, possible that he does have a hematoma after the fall, however no bruising noted on exam.- trend CBCs daily# Anion gap metabolic acidosisLikely component of starvation ketosis, however UA negative for ketones.- monitor bicarb following fluid resuscitation# hyponatremiaLikely due to dehydration.  Now s/p 1 L NS, will monitor and repeat labs.- serum osmolality- urine lytes- daily BMP# AKI on CKD# chronic urinary retention requiring Foley# chronic hydroureteronephrosisReceived 1L NS overnight.  Foley changed in ED on arrival.- urine lytes- daily BMP- cont home sodium bicarb# Other hospital issues- CAD: cont home asa, statin, plavix  - unclear if this remains indicated especially iso- mood: cont escitalopram - neuropathy: cont home gabapentin - GERD: change home omeprazole  for pepcid , should NOT restart omeprazole  on discharge as it decreases effects of plavix  (assuming pt still needs DAPT)- constipation: stool burden noted on [place] - miralax  daily, senna nightlyCode status:  Patient Aox2 and not understanding of situation.  Per chart review all previous code status have been full code.  There is an ACP document from 2021 that supports wishes for full code.  Called to discuss with patient's next of kin, daughter Mark [name] early this morning.  She states she believes her father would like to be full code at this time although she shared some concerns about him getting chest compressions etc..  Opted to have him remain full code at this time but continue discussions throughout the day and this admission. She is planning to visit later today.# Admission information- Diet: Diet Regular- Code status: Full Code- DVT prophylaxis:  Heparin  subQ- Length of stay: 0Final plan pending attending attestation.Signed:Vinson Tietze L Chue Berkovich, MDPGY-2, Internal Medicine - Primary CareMHB5/24/2025

## 2023-07-22 NOTE — ED Notes
 2:34 AM Floor Handoff Telemetry: 	[]  Yes		[x]  NoCode Status:   [x]  Full		[]  DNR		[]  DNI		Other (specify):Safety Precautions: [x]  Fall Risk  []  Sitter   []  Restraints	[]  Suicidal	[]  None	Other (specify):Mentation/Orientation:	 A&O (Self, person, place, time) x      4    	 Disoriented to:                    	 Special Accommodations: []  Hearing impaired   []  Blind  []  Nonverbal  []  Cognitive impairmentOxygenation Upon Admission: [x]  RA	[]  NC	[]  Venti  []  Simple Mask []  Other	Baseline O2 Status? [x]  Yes	[]  NoAmbulation: []  Independent	[]  Cane   [x]  Walker	[]  Wheelchair	[]  Bedbound		[]  Hemiplegic	[]  Paraplegic	[]  QuadraplegicEliminiation: []  Independent	[]  Commode	[]  Bedpan/Urinal  []  Straight Cath [x]  Foley cath			[]  Urostomy	[]  Colostomy	Other (specify):Diarrhea/Loose stool : []  1x within 24h  []  2x within 24h  []  3x within 24h  [x]  None 	C.Diff Order: 	[]  Ordered- needs to be collected             []  Collected-sent to lab             []  Resulted - Negative C.Diff             []  Resulted - Positive C.Diff[]  Not Ordered   [x]  N/ASkin Alteration: []  Pressure Injury []  Wound [x]  None []  Skin not assessedDiet: [x]  Regular/No order placed	[]  NPO		Other (specify):IV Access: [x]  PIV   []  PICC    []  Port    [] Central line    []  A-line    Other (specify)IVF/GTT Running Upon ED Departure? [x]  No	    []  Yes (specify):Outstanding Meds/Treatments/Tests:Meds Not Given: (as of time of note) 2:34 AM  		                                                 		Patient Belongings:Are the belongings documented?          [x]  No	    []  YesIs someone taking belongings home?   []  No     []  Yes  Who? (specify)                                   Valiant Gaul, RN

## 2023-07-22 NOTE — Plan of Care
 Problem: Adult Inpatient Plan of CareGoal: Plan of Care ReviewOutcome: Interventions implemented as appropriateGoal: Patient-Specific Goal (Individualized)Outcome: Interventions implemented as appropriateGoal: Absence of Hospital-Acquired Illness or InjuryOutcome: Interventions implemented as appropriateGoal: Optimal Comfort and WellbeingOutcome: Interventions implemented as appropriateGoal: Readiness for Transition of CareOutcome: Interventions implemented as appropriate Problem: WoundGoal: Optimal CopingOutcome: Interventions implemented as appropriateGoal: Optimal Functional AbilityOutcome: Interventions implemented as appropriateGoal: Absence of Infection Signs and SymptomsOutcome: Interventions implemented as appropriateGoal: Improved Oral IntakeOutcome: Interventions implemented as appropriateGoal: Optimal Pain Control and FunctionOutcome: Interventions implemented as appropriateGoal: Skin Health and IntegrityOutcome: Interventions implemented as appropriateGoal: Optimal Wound HealingOutcome: Interventions implemented as appropriate Problem: Skin Injury Risk IncreasedGoal: Skin Health and IntegrityOutcome: Interventions implemented as appropriate Problem: Fall Injury RiskGoal: Absence of Fall and Fall-Related InjuryOutcome: Interventions implemented as appropriate Problem: InfectionGoal: Absence of Infection Signs and SymptomsOutcome: Interventions implemented as appropriate Plan of Care Overview/ Patient StatusAdmission Note Nursing Mark [name] is a 89 y.o. male admitted with a chief complaint of . Fall at home and fever. Patient arrived from ED via stretcher.  Patient is alert and oriented x2-3. Off to place and time. cooperative with care.Denies pain, no sign of distress. Pt febrile, tylenol  given prior pt coming on floor. Lungs dim on RA. Denies cough and SOBAbdomen soft, + bowel sounds, LBM 5/23. Chronic foley in place draining slightly bloody urine. Assist x2 in bed. R/o droplet and contact precaution for respiratory virus. Safety maintained. Will continue to monitor. 0300 Pt hypotensive, BP readings in the low 90s, rectal temp 100F, manual BP 90/42. Provider notified, 500 bolus of NS ordered and started, antibiotics, blood work and [facility] ordered.  RRT called for evaluation of hypotension and further IV access.   BP improved after 500cc of bolus. WCTM. Vitals:  07/22/23 0529 07/22/23 0538 07/22/23 0545 07/22/23 0655 BP: (!) 84/53 (!) 102/51 (!) 97/51 (!) 94/50 Pulse:  87 87 88 Resp:  20  20 Temp:    98.3 ?F (36.8 ?C) TempSrc:    Oral SpO2: 96% 95% 95% 95% Weight:     Oxygen therapy Oxygen TherapySpO2: 95 %Device (Oxygen Therapy): room airI have reviewed the patient's current medication orders..I have reviewed patient valuables Belongings charted in last 7 days:  Comments:See flowsheets, patient education and plan of care for additional information.

## 2023-07-23 ENCOUNTER — Encounter: Admit: 2023-07-23 | Payer: PRIVATE HEALTH INSURANCE | Attending: Internal Medicine | Primary: Internal Medicine

## 2023-07-23 DIAGNOSIS — I2510 Atherosclerotic heart disease of native coronary artery without angina pectoris: Secondary | ICD-10-CM

## 2023-07-23 DIAGNOSIS — I5030 Unspecified diastolic (congestive) heart failure: Secondary | ICD-10-CM

## 2023-07-23 DIAGNOSIS — D568 Other thalassemias: Secondary | ICD-10-CM

## 2023-07-23 DIAGNOSIS — I714 AAA (abdominal aortic aneurysm) (HC Code): Secondary | ICD-10-CM

## 2023-07-23 DIAGNOSIS — I10 Essential (primary) hypertension: Secondary | ICD-10-CM

## 2023-07-23 DIAGNOSIS — K219 Gastro-esophageal reflux disease without esophagitis: Secondary | ICD-10-CM

## 2023-07-23 DIAGNOSIS — E782 Mixed hyperlipidemia: Secondary | ICD-10-CM

## 2023-07-23 DIAGNOSIS — J309 Allergic rhinitis, unspecified: Secondary | ICD-10-CM

## 2023-07-23 DIAGNOSIS — N184 Chronic kidney disease, stage 4 (severe): Secondary | ICD-10-CM

## 2023-07-23 DIAGNOSIS — E785 Hyperlipidemia, unspecified: Secondary | ICD-10-CM

## 2023-07-23 DIAGNOSIS — D509 Iron deficiency anemia, unspecified: Secondary | ICD-10-CM

## 2023-07-23 DIAGNOSIS — I6529 Occlusion and stenosis of unspecified carotid artery: Secondary | ICD-10-CM

## 2023-07-23 DIAGNOSIS — M4800 Spinal stenosis, site unspecified: Secondary | ICD-10-CM

## 2023-07-23 DIAGNOSIS — N400 Enlarged prostate without lower urinary tract symptoms: Secondary | ICD-10-CM

## 2023-07-23 DIAGNOSIS — Z955 Presence of coronary angioplasty implant and graft: Secondary | ICD-10-CM

## 2023-07-23 DIAGNOSIS — I252 Old myocardial infarction: Secondary | ICD-10-CM

## 2023-07-23 DIAGNOSIS — H9193 Unspecified hearing loss, bilateral: Secondary | ICD-10-CM

## 2023-07-23 DIAGNOSIS — J329 Chronic sinusitis, unspecified: Secondary | ICD-10-CM

## 2023-07-23 LAB — BASIC METABOLIC PANEL
BKR ANION GAP: 14 (ref 7–17)
BKR BLOOD UREA NITROGEN: 54 mg/dL — ABNORMAL HIGH (ref 8–23)
BKR BUN / CREAT RATIO: 12.6 (ref 8.0–23.0)
BKR CALCIUM: 8.1 mg/dL — ABNORMAL LOW (ref 8.8–10.2)
BKR CHLORIDE: 106 mmol/L (ref 98–107)
BKR CO2: 18 mmol/L — ABNORMAL LOW (ref 20–30)
BKR CREATININE DELTA: 0.4
BKR CREATININE: 4.3 mg/dL — ABNORMAL HIGH (ref 0.40–1.30)
BKR EGFR, CREATININE (CKD-EPI 2021): 12 mL/min/{1.73_m2} — ABNORMAL LOW (ref >=60)
BKR GLUCOSE: 134 mg/dL — ABNORMAL HIGH (ref 70–100)
BKR POTASSIUM: 4.8 mmol/L (ref 3.3–5.3)
BKR SODIUM: 138 mmol/L (ref 136–144)

## 2023-07-23 LAB — CBC WITH AUTO DIFFERENTIAL
BKR WAM ABSOLUTE IMMATURE GRANULOCYTES.: 0.05 x 1000/ÂµL (ref 0.00–0.30)
BKR WAM ABSOLUTE LYMPHOCYTE COUNT.: 1.03 x 1000/ÂµL (ref 0.60–3.70)
BKR WAM ABSOLUTE NRBC: 0 x 1000/ÂµL (ref 0.00–1.00)
BKR WAM ANC (ABSOLUTE NEUTROPHIL COUNT): 7.41 x 1000/ÂµL (ref 2.00–7.60)
BKR WAM BASOPHIL ABSOLUTE COUNT.: 0.06 x 1000/ÂµL (ref 0.00–1.00)
BKR WAM BASOPHILS: 0.6 % (ref 0.0–1.4)
BKR WAM EOSINOPHIL ABSOLUTE COUNT.: 0.55 x 1000/ÂµL (ref 0.00–1.00)
BKR WAM EOSINOPHILS: 5.3 % — ABNORMAL HIGH (ref 0.0–5.0)
BKR WAM HEMATOCRIT: 23.8 % — ABNORMAL LOW (ref 38.50–50.00)
BKR WAM HEMOGLOBIN: 7.3 g/dL — ABNORMAL LOW (ref 13.2–17.1)
BKR WAM IMMATURE GRANULOCYTES: 0.5 % (ref 0.0–1.0)
BKR WAM LYMPHOCYTES: 9.9 % — ABNORMAL LOW (ref 17.0–50.0)
BKR WAM MCH: 23.2 pg — ABNORMAL LOW (ref 27.0–33.0)
BKR WAM MCHC: 30.7 g/dL — ABNORMAL LOW (ref 31.0–36.0)
BKR WAM MCV: 75.8 fL — ABNORMAL LOW (ref 80.0–100.0)
BKR WAM MONOCYTE ABSOLUTE COUNT.: 1.3 x 1000/ÂµL — ABNORMAL HIGH (ref 0.00–1.00)
BKR WAM MONOCYTES: 12.5 % — ABNORMAL HIGH (ref 4.0–12.0)
BKR WAM MPV: 10 fL (ref 8.0–12.0)
BKR WAM NEUTROPHILS: 71.2 % (ref 39.0–72.0)
BKR WAM NUCLEATED RED BLOOD CELLS: 0 % (ref 0.0–1.0)
BKR WAM PLATELETS: 225 x1000/ÂµL (ref 150–420)
BKR WAM RDW-CV: 18.6 % — ABNORMAL HIGH (ref 11.0–15.0)
BKR WAM RED BLOOD CELL COUNT.: 3.14 M/ÂµL — ABNORMAL LOW (ref 4.00–6.00)
BKR WAM WHITE BLOOD CELL COUNT: 10.4 x1000/ÂµL (ref 4.0–11.0)

## 2023-07-23 LAB — HEPATIC FUNCTION PANEL
BKR A/G RATIO: 0.9 — ABNORMAL LOW (ref 1.0–2.2)
BKR ALANINE AMINOTRANSFERASE (ALT): 13 U/L (ref 9–59)
BKR ALBUMIN: 3 g/dL — ABNORMAL LOW (ref 3.6–5.1)
BKR ALKALINE PHOSPHATASE: 186 U/L — ABNORMAL HIGH (ref 9–122)
BKR ASPARTATE AMINOTRANSFERASE (AST): 57 U/L — ABNORMAL HIGH (ref 10–35)
BKR AST/ALT RATIO: 4.4
BKR BILIRUBIN DIRECT: 0.2 mg/dL (ref <=0.2)
BKR BILIRUBIN TOTAL: 0.4 mg/dL (ref <=1.2)
BKR GLOBULIN: 3.3 g/dL (ref 2.0–3.9)
BKR PROTEIN TOTAL: 6.3 g/dL (ref 5.9–8.3)

## 2023-07-23 LAB — MAGNESIUM: BKR MAGNESIUM: 2 mg/dL (ref 1.7–2.4)

## 2023-07-23 LAB — PHOSPHORUS     (BH GH L LMW YH): BKR PHOSPHORUS: 3.8 mg/dL (ref 2.2–4.5)

## 2023-07-23 MED ORDER — TRAZODONE (DESYREL) 25 MG HALFTAB
25 | Freq: Every evening | ORAL | Status: CP | PRN
Start: 2023-07-23 — End: ?
  Administered 2023-07-23: 23:00:00 25 mg via ORAL

## 2023-07-23 MED ORDER — MELATONIN 3 MG TABLET
3 | Freq: Once | ORAL | Status: CP
Start: 2023-07-23 — End: ?
  Administered 2023-07-23: 23:00:00 3 mg via ORAL

## 2023-07-23 MED ORDER — VITAMIN B COMPLEX TABLET
Freq: Every day | ORAL | Status: SS
Start: 2023-07-23 — End: ?

## 2023-07-23 MED ORDER — AMOXICILLIN 500 MG-POTASSIUM CLAVULANATE 125 MG TABLET
500-125 | Freq: Two times a day (BID) | ORAL | Status: DC
Start: 2023-07-23 — End: 2023-07-24
  Administered 2023-07-23 – 2023-07-24 (×2): 500-125 mg via ORAL

## 2023-07-23 MED ORDER — POLYETHYLENE GLYCOL 3350 17 GRAM ORAL POWDER PACKET
17 | Freq: Two times a day (BID) | ORAL | Status: CP
Start: 2023-07-23 — End: ?
  Administered 2023-07-23 – 2023-08-01 (×8): 17 gram via ORAL

## 2023-07-23 MED ORDER — SENNOSIDES 8.6 MG TABLET
8.6 | Freq: Two times a day (BID) | ORAL | Status: CP
Start: 2023-07-23 — End: ?
  Administered 2023-07-23 – 2023-08-03 (×16): 8.6 mg via ORAL

## 2023-07-23 MED ORDER — NITROGLYCERIN 0.4 MG SUBLINGUAL TABLET
0.4 | SUBLINGUAL | Status: SS | PRN
Start: 2023-07-23 — End: ?

## 2023-07-23 MED ORDER — SODIUM CHLORIDE 0.9 % IV BOLUS NEW BAG (DROPS CHARGE)
0.9 | Freq: Once | INTRAVENOUS | Status: CP
Start: 2023-07-23 — End: ?
  Administered 2023-07-23: 10:00:00 0.9 mL/h via INTRAVENOUS

## 2023-07-23 MED ORDER — MELATONIN 3 MG TABLET
3 | Freq: Every evening | ORAL | Status: CP
Start: 2023-07-23 — End: ?
  Administered 2023-07-24 – 2023-08-02 (×10): 3 mg via ORAL

## 2023-07-23 MED ORDER — PIPERACILLIN-TAZOBACTAM (ZOSYN) 2.25GM MBP
Freq: Once | INTRAVENOUS | Status: CP
Start: 2023-07-23 — End: ?
  Administered 2023-07-23: 12:00:00 50.000 mL/h via INTRAVENOUS

## 2023-07-23 NOTE — Progress Notes
 Inpatient Medicine - Progress NoteHospital day: 1Subjective: Yesterday: - Hypotensive iso ?sepsis- Hypoactive delirium noted- Ongoing AKIOvernight: - NAEONToday: Doing much better, able to share his name. He knew he was in the hospital. He endorsed left hip pain. He was able to answer questions appropriately and make his needs known. He drank juice and water  for us .  Objective: Temp:  [97.9 ?F (36.6 ?C)-98.6 ?F (37 ?C)] 97.9 ?F (36.6 ?C)Pulse:  [77-90] 88Resp:  [16-20] 18BP: (101-121)/(45-64) 101/45SpO2:  [95 %-98 %] 97 %Device (Oxygen Therapy): room airPhysical ExamConstitutional:     General: He is awake. He is not in acute distress.   Appearance: Normal appearance. She is not ill-appearing or diaphoretic. Cardiovascular:    Rate and Rhythm: Normal rate and regular rhythm.   Heart sounds: Murmur appreciatedPulmonary:    Effort: Pulmonary effort is normal. No tachypnea, bradypnea, accessory muscle usage or respiratory distress.    Breath sounds: Normal breath sounds. No stridor. No wheezing or rhonchi. Musculoskeletal:       General: No swelling. Tenderness to palpation in left hip.    Right lower leg: No edema.    Left lower leg: No edema. Skin:   General: Skin is warm.    Findings: No abrasion, bruising or erythema. Neurological:    General: No focal deficit present.    Mental Status: He is alert, oriented to person, place, and situation.    Motor: No weakness.  Labs notable forNa: 138K: 4.8Cr: 4.30GFR: 12Calcium: 8.1Alk Phos: RVP negativeS. Pneumo: Positive MRSA: DetectedImaging [place] CHEST ABDOMEN PELVIS WO IV CONTRAST HISTORY/INDICATION:   fall, GFR 19, hx of AAA, now with L hip pain  Impression1. No evidence of acute traumatic injury to the chest, abdomen, or pelvis. 2. No evidence of acute traumatic injury to the thoracic or lumbar spine. 3. 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 4. Moderate to severe bilateral hydroureteronephrosis is not significantly changed. 5. Nonspecific stable pelvic side wall lymph nodes measuring up to 1.3 cm may be reactive in the setting of acute cystitis. 6. Stable infrarenal abdominal aortic aneurysm measuring up to 5.2 cm in diameter. Assessment: Mark [name] is a 89 yo man w/ HFpEF (EF 65% 05/10/23), CAD, AAA, HTN, BPH w/ chronic foley (since at least 2024), orthostatic hypotension on midodrine  who presented on 5/23 from his nursing facility with hip pain after a fall. Pt admitted for possible UTI related sepsis, now much improved waiting for AKI to get better and continuing antibiotics. Plan: # Multiple organ dysfunction syndrome# Fever, Leukocytosis, hypotensionUTIs most likely source of infection considering [place] abdomen findings as well as UA.  Patient has chronic Foley which also predispose patient for colonization vs infection.  Of note, patient denies dysuria.Previous urine culture grew >100 K CFU Klebsiella susceptible to CTX (06/09/23).CXR and  chest without signs of pneumonia.  Blood pressure responds well to IVFs.- s/p 500mL bolus, given MAP of 64- dc vanc, switched zosyn  to augmentin - f/u blood cultures- Midodrine  2.5 mg decreased from TID to BID   # FallPatient does not recall the events leading up to admission. No bruising noted on exam.- Tylenol  1000 prn- PT/OT  # AKI on CKD# chronic urinary retention requiring Foley# chronic hydroureteronephrosisFoley changed in ED on arrival, s/p IVF resuscitation - Urine lytes- Daily BMP- Cont home sodium bicarb- Encourage PO intake and IVF # Other hospital issues- CAD: cont home statin- Mood: cont  escitalopram - Neuropathy: cont home gabapentin - GERD: change home omeprazole  for pepcid , should NOT restart omeprazole  on discharge as it decreases effects of plavix  (assuming pt still needs DAPT)- constipation: stool burden noted on [place] - miralax  daily, senna nightlyDiet: RenalProphylaxis: HeparinCode status: Full CodeBarriers to discharge: Discussed with attending. Await addendum. Signed: Artelia [name], MDPGY-3, Internal Medicine - Primary CareMHB

## 2023-07-23 NOTE — Plan of Care
 Inpatient Physical Therapy Evaluation IP Adult PT Eval/Treat - 07/23/23 1210    Date of Visit / Treatment  Date of Visit / Treatment 07/23/23   Note Type Evaluation   Progress Report Due 08/06/23   Start Time 1145   End Time 1210   Total Treatment Time 25    General Information  Pertinent History Of Current Problem per chart 89 yo man w/ HFpEF (EF 65% 05/10/23), CAD, AAA, HTN, BPH w/ chronic foley (since at least 2024), orthostatic hypotension on midodrine  who presented on 5/23 from his nursing facility with hip pain after a fall. Pt admitted for possible UTI related sepsis, now much improved waiting for AKI to get better and continuing antibiotics.    Subjective It hurts   General Observations supine in bed, foley, sleepy but arousable   Precautions/Limitations Fall Precautions;Bed alarm;Chair alarm    Prior Level of Functioning/Social History  Additional Comments per chart: pt lives with his daughter in a 2 level condo, has 2 chair lifts within the home, ambulates using a rollator, also owns a RW, SC, wheelchair, commode, and shower chair.    Vital Signs and Orthostatic Vital Signs  Vital Signs Free text RA, BP 106/58 supine, 120/61 sitting, NAD    Pain/Comfort  Pain Location - Side Left   Pain Location hip   Pain Comment (Pre/Post Treatment Pain) mod pain, RN aware    Cognition  Overall Cognitive Status Impaired   Orientation Level Oriented to person;Oriented to place;Disoriented to time;Disoriented to situation   Level of Consciousness confused   Following Commands Follows one step commands with increased time;Follows one step commands with repetition   Administrator, arts / Judgment Fall risk    Range of Motion  Range of Motion Examination WFL except   Range of Motion Comments L hip limited by pain    Manual Muscle Testing  Manual Muscle Testing Results Other (see comments)   Manual Muscle Testing Comments general weakness, grossly 3-/5 throughout    Muscle Tone  Muscle Tone Testing Results No muscle tone deficits noted    Sensory Assessment  Sensory Tests Results No sensory impairment noted    Skin Assessment  Skin Assessment See Nursing Documentation    Balance  Sitting Balance: Static  FAIR       Maintains static position without assist or device, may require Supervision or Verbal Cues (<2 minutes)   Sitting Balance: Dynamic  FAIR-     Performs dynamic activities through partial range (50-75%) with Contact Guard    Bed Mobility  Symptoms Noted During/After Treatment increased pain   Supine-to-Sit Independence/Assistance Level Moderate assist;Assist of 2;Verbal cues   Supine-to-Sit Assist Device Draw pad;Hand held assist;Head of bed elevated   Sit-to-Supine Independence/Assistance Level Maximum assist;Assist of 2;Verbal cues   Bed Mobility, Impairments decreased flexibility;strength decreased;postural control impaired;pain   Bed Mobility Comments c/o incr L hip pain, able to sit with close supervision, declining to stand    Handoff Documentation  Handoff Patient in bed;Bed alarm;Patient instructed to call nursing for mobility;Discussed with nursing    PT- AM-PAC - Basic Mobility Screen- How much help from another person do you currently need.....  Turning from your back to your side while in a a flat bed without using rails? 1 - Total - Requires total assistance or cannot do it at all.   Moving from lying on your back to sitting on the side of a flat bed without using bed rails? 1 - Total - Requires total assistance or cannot  do it at all.   Moving to and from a bed to a chair (including a wheelchair)? 1 - Total - Requires total assistance or cannot do it at all.   Standing up from a chair using your arms(e.g., wheelchair or bedside chair)? 1 - Total - Requires total assistance or cannot do it at all.   To walk in a hospital room? 1 - Total - Requires total assistance or cannot do it at all.   Climbing 3-5 steps with a railing? 1 - Total - Requires total assistance or cannot do it at all.   AMPAC Mobility Score 6   TARGET Highest Level of Mobility Mobility Level 2, Turn self in bed/bed activity/dependent transfer    ACTUAL Highest Level of Mobility Mobility Level 3, Sit on edge of bed    Clinical Impression  Initial Assessment Fair tolerance, patient sleepy/confused but cooperative, able to mobilize to EOB with moderate assistance of two persons.  Limited by pain, weakness, impaired balance, and decr endurance.  Recommend moderate complexity support at discharge   Criteria for Skilled Therapeutic Interventions Met yes   Rehab Potential good, to achieve stated therapy goals    Patient/Family Stated Goals  Patient/Family Stated Goal(s) feel better    Frequency/Equipment Recommendations  PT Frequency 3x per week   Next Treatment Expected 07/26/23   PT/PTA completing this assessment dani   Equipment Needs During Admission/Treatment Hospital bed    PT Recommendations for Inpatient Admission  Activity/Level of Assist dangle edge of bed;assist of 2    Education - Learning Assessment   Education Topic Functional mobility techniques;Therapy role/rehab process;Discharge planning   Learners Patient   Readiness Acceptance   Method Explanation;Demonstration   Response Verbalizes Understanding;Needs Reinforcement    Planned Treatment / Interventions  Education Treatment / Interventions Patient Education / Training   role of PT, POC, goals   PT Discharge Summary  Physical Therapy Disposition Recommendation Moderate complexity support and therapy to progress functional mobility/ ADLs/ IADLs recommended for post- acute care.  See assessment for additional details.     Problem: Physical Therapy GoalsGoal: Physical Therapy GoalsDescription: PT GOALS1. Patient will perform bed mobility with minimal assist x 22. Patient will perform transfers with minimal assist x 2 using appropriate assistive device 3. Patient will ambulate a minimum of 15 feet with minimal assist x 2 using appropriate assistive device Outcome: Interventions implemented as appropriate Colby Daub, DPT

## 2023-07-23 NOTE — Other
 PHARMACY-ASSISTED MEDICATION REPORTPharmacist review of the best possible medication history obtained by the pharmacy medication history technician has been performed.  I have updated the home medication list and identified the following information that may be relevant to this admission.NOTES/RECOMMENDATIONSNo recommendations at this time.         Prior to Admission Medications Medication Name Sig Taking? Patient Reported   acetaminophen  (TYLENOL ) 325 mg tabletLast dose:  --  Take 2 tablets (650 mg total) by mouth every 6 (six) hours as needed. Yes     aspirin  81 mg EC delayed release tabletLast dose:  --  Take 1 tablet (81 mg total) by mouth daily. Yes     atorvastatin  (LIPITOR) 80 mg tabletLast dose:  --  Take 1 tablet (80 mg total) by mouth daily. Yes     b complex vitamins (B COMPLEX) tabletLast dose:  --  Take 1 tablet by mouth daily. Yes Yes   camphor-menthoL  (SARNA) 0.5-0.5 % lotionLast dose:  --  Apply topically 2 (two) times daily as needed for itching. Yes     clopidogreL  (PLAVIX ) 75 mg tabletLast dose:  --  Take 1 tablet (75 mg total) by mouth daily. Yes     escitalopram  oxalate (LEXAPRO ) 10 mg tabletLast dose:  --  Take 1 tablet (10 mg total) by mouth daily. Yes     gabapentin  (NEURONTIN ) 100 mg capsuleLast dose:  --  Take 2 capsules (200 mg total) by mouth at bedtime. Yes     midodrine  (PROAMATINE ) 2.5 mg tabletLast dose:  --  Take 1 tablet (2.5 mg total) by mouth 3 (three) times daily as needed (Orthostatic hypotension). Do not take any doses later than evening meal unless 4 hours before bedtime. Yes     nitroGLYCERIN  (NITROSTAT ) 0.4 mg SL tabletLast dose:  --  Place 1 tablet (0.4 mg total) under the tongue as needed for chest pain. Yes Yes   omeprazole  (PRILOSEC) 20 mg capsuleLast dose:  --  Take 1 capsule (20 mg total) by mouth daily. Yes     polyethylene glycol (GLYCOLAX ; MIRALAX ) 17 gram/dose powderLast dose:  -- Take 17 g by mouth daily as needed for constipation. Yes Yes   senna (SENOKOT) 8.6 mg tabletLast dose:  --  Take 2 tablets (17.2 mg total) by mouth 2 (two) times daily. Yes     sodium bicarbonate  650 mg tabletLast dose:  --  Take 1 tablet (650 mg total) by mouth 3 (three) times daily. Yes     Prior to admission medications last reviewed by Marjean Sierras, PharmD on Sun Jul 23, 2023 1502 Thank you,Anitra Doxtater Lydia Sams, PharmD5/25/20253:03 PMPhone: MHB

## 2023-07-23 NOTE — Plan of Care
 Plan of Care Overview/ Patient StatusPatient is A&OX3. Heart sounds s1, s2 heard, lung sounds slightly diminished and bowel sounds (+) in all 4 quadrants. Foley in place and draining yellow urine, with some blood noted. Good po and fluid intake, IV antibiotics continued. Meds per [facility], care per POC and call light within reach. Carles Cheadle, RN

## 2023-07-23 NOTE — Other
 I called and updated the patient's daughter, Ms. Susan Garrette. She had concerns regarding the patient's new MRSA and Strep Pneumo test results. She also wants him to go to [facility] after the hospital. She said [facility] best facility they have sent him to before and would love for him to be able to go back there. All questions and concerns were answered. Signed, Artelia Bijou, MD05/25/25

## 2023-07-23 NOTE — Plan of Care
 Plan of Care Overview/ Patient StatusAssumed care of pt @ 1515Neuro: A&Ox3, disoriented to situation. Generalized tremor at baselineCardiac: Denies SOB and chest painResp: RA, respirations even and unlaboredGI/GU: Abdomen NT/ND, +BS, tolerating renal diet, pills whole with waterFoley catheter in place draining yellow urine, foley care providedMSK: Pt is Ax2 bedrest this shift. Turn & reposition assistance provided Q2 hours.Skin: Blanchable redness to bottomAccess: R PIV, CDIPain: denies painSafety maintained, call bell and belongings in reach, bed low and locked, hourly rounding and POC ongoing.Problem: Adult Inpatient Plan of CareGoal: Plan of Care ReviewOutcome: Interventions implemented as appropriateGoal: Patient-Specific Goal (Individualized)Outcome: Interventions implemented as appropriateGoal: Absence of Hospital-Acquired Illness or InjuryOutcome: Interventions implemented as appropriateGoal: Optimal Comfort and WellbeingOutcome: Interventions implemented as appropriateGoal: Readiness for Transition of CareOutcome: Interventions implemented as appropriate Problem: WoundGoal: Optimal CopingOutcome: Interventions implemented as appropriateGoal: Optimal Functional AbilityOutcome: Interventions implemented as appropriateGoal: Absence of Infection Signs and SymptomsOutcome: Interventions implemented as appropriateGoal: Improved Oral IntakeOutcome: Interventions implemented as appropriateGoal: Optimal Pain Control and FunctionOutcome: Interventions implemented as appropriateGoal: Skin Health and IntegrityOutcome: Interventions implemented as appropriateGoal: Optimal Wound HealingOutcome: Interventions implemented as appropriate Problem: Skin Injury Risk IncreasedGoal: Skin Health and IntegrityOutcome: Interventions implemented as appropriate Problem: Fall Injury RiskGoal: Absence of Fall and Fall-Related InjuryOutcome: Interventions implemented as appropriate Problem: InfectionGoal: Absence of Infection Signs and SymptomsOutcome: Interventions implemented as appropriate Problem: Physical Therapy GoalsGoal: Physical Therapy GoalsDescription: PT GOALS1. Patient will perform bed mobility with minimal assist x 22. Patient will perform transfers with minimal assist x 2 using appropriate assistive device 3. Patient will ambulate a minimum of 15 feet with minimal assist x 2 using appropriate assistive device Outcome: Interventions implemented as appropriate

## 2023-07-24 LAB — CBC WITH AUTO DIFFERENTIAL
BKR WAM ABSOLUTE IMMATURE GRANULOCYTES.: 0.05 x 1000/ÂµL (ref 0.00–0.30)
BKR WAM ABSOLUTE LYMPHOCYTE COUNT.: 0.84 x 1000/ÂµL (ref 0.60–3.70)
BKR WAM ABSOLUTE NRBC: 0 x 1000/ÂµL (ref 0.00–1.00)
BKR WAM ANC (ABSOLUTE NEUTROPHIL COUNT): 6.81 x 1000/ÂµL (ref 2.00–7.60)
BKR WAM BASOPHIL ABSOLUTE COUNT.: 0.03 x 1000/ÂµL (ref 0.00–1.00)
BKR WAM BASOPHILS: 0.3 % (ref 0.0–1.4)
BKR WAM EOSINOPHIL ABSOLUTE COUNT.: 0.48 x 1000/ÂµL (ref 0.00–1.00)
BKR WAM EOSINOPHILS: 5.4 % — ABNORMAL HIGH (ref 0.0–5.0)
BKR WAM HEMATOCRIT: 23.5 % — ABNORMAL LOW (ref 38.50–50.00)
BKR WAM HEMOGLOBIN: 7.1 g/dL — ABNORMAL LOW (ref 13.2–17.1)
BKR WAM IMMATURE GRANULOCYTES: 0.6 % (ref 0.0–1.0)
BKR WAM LYMPHOCYTES: 9.4 % — ABNORMAL LOW (ref 17.0–50.0)
BKR WAM MCH: 22.8 pg — ABNORMAL LOW (ref 27.0–33.0)
BKR WAM MCHC: 30.2 g/dL — ABNORMAL LOW (ref 31.0–36.0)
BKR WAM MCV: 75.3 fL — ABNORMAL LOW (ref 80.0–100.0)
BKR WAM MONOCYTE ABSOLUTE COUNT.: 0.75 x 1000/ÂµL (ref 0.00–1.00)
BKR WAM MONOCYTES: 8.4 % (ref 4.0–12.0)
BKR WAM MPV: 9.7 fL (ref 8.0–12.0)
BKR WAM NEUTROPHILS: 75.9 % — ABNORMAL HIGH (ref 39.0–72.0)
BKR WAM NUCLEATED RED BLOOD CELLS: 0 % (ref 0.0–1.0)
BKR WAM PLATELETS: 227 x1000/ÂµL (ref 150–420)
BKR WAM RDW-CV: 18.6 % — ABNORMAL HIGH (ref 11.0–15.0)
BKR WAM RED BLOOD CELL COUNT.: 3.12 M/ÂµL — ABNORMAL LOW (ref 4.00–6.00)
BKR WAM WHITE BLOOD CELL COUNT: 9 x1000/ÂµL (ref 4.0–11.0)

## 2023-07-24 LAB — BASIC METABOLIC PANEL
BKR ANION GAP: 13 (ref 7–17)
BKR BLOOD UREA NITROGEN: 51 mg/dL — ABNORMAL HIGH (ref 8–23)
BKR BUN / CREAT RATIO: 12.4 (ref 8.0–23.0)
BKR CALCIUM: 8.1 mg/dL — ABNORMAL LOW (ref 8.8–10.2)
BKR CHLORIDE: 106 mmol/L (ref 98–107)
BKR CO2: 18 mmol/L — ABNORMAL LOW (ref 20–30)
BKR CREATININE DELTA: -0.2
BKR CREATININE: 4.1 mg/dL — ABNORMAL HIGH (ref 0.40–1.30)
BKR EGFR, CREATININE (CKD-EPI 2021): 13 mL/min/{1.73_m2} — ABNORMAL LOW (ref >=60)
BKR GLUCOSE: 106 mg/dL — ABNORMAL HIGH (ref 70–100)
BKR POTASSIUM: 4.9 mmol/L (ref 3.3–5.3)
BKR SODIUM: 137 mmol/L (ref 136–144)

## 2023-07-24 LAB — GAMMA GT: BKR GAMMA GLUTAMYL TRANSFERASE: 30 U/L (ref <48)

## 2023-07-24 LAB — HEPATIC FUNCTION PANEL
BKR A/G RATIO: 0.7 — ABNORMAL LOW (ref 1.0–2.2)
BKR ALANINE AMINOTRANSFERASE (ALT): 18 U/L (ref 9–59)
BKR ALBUMIN: 2.8 g/dL — ABNORMAL LOW (ref 3.6–5.1)
BKR ALKALINE PHOSPHATASE: 150 U/L — ABNORMAL HIGH (ref 9–122)
BKR ASPARTATE AMINOTRANSFERASE (AST): 36 U/L — ABNORMAL HIGH (ref 10–35)
BKR AST/ALT RATIO: 2
BKR BILIRUBIN DIRECT: 0.2 mg/dL (ref <=0.2)
BKR BILIRUBIN TOTAL: 0.3 mg/dL (ref <=1.2)
BKR GLOBULIN: 3.9 g/dL (ref 2.0–3.9)
BKR PROTEIN TOTAL: 6.7 g/dL (ref 5.9–8.3)

## 2023-07-24 LAB — PHOSPHORUS     (BH GH L LMW YH): BKR PHOSPHORUS: 4.4 mg/dL (ref 2.2–4.5)

## 2023-07-24 LAB — URINE CULTURE
BKR URINE CULTURE, ROUTINE: >=100000 — AB
BKR URINE CULTURE, ROUTINE: NO GROWTH

## 2023-07-24 LAB — MAGNESIUM: BKR MAGNESIUM: 1.9 mg/dL (ref 1.7–2.4)

## 2023-07-24 MED ORDER — ACETAMINOPHEN 500 MG TABLET
500 | Freq: Three times a day (TID) | ORAL | Status: CP
Start: 2023-07-24 — End: ?
  Administered 2023-07-24 – 2023-07-26 (×6): 500 mg via ORAL

## 2023-07-24 MED ORDER — DICLOFENAC 1 % TOPICAL GEL
1 | Freq: Three times a day (TID) | TOPICAL | Status: CP
Start: 2023-07-24 — End: ?
  Administered 2023-07-24 – 2023-08-03 (×18): 1 % via TOPICAL

## 2023-07-24 MED ORDER — CIPROFLOXACIN 500 MG TABLET
500 | ORAL | Status: DC
Start: 2023-07-24 — End: 2023-07-27
  Administered 2023-07-24 – 2023-07-27 (×4): 500 mg via ORAL

## 2023-07-24 MED ORDER — MAGNESIUM CITRATE ORAL SOLUTION
Freq: Once | ORAL | Status: CP
Start: 2023-07-24 — End: ?
  Administered 2023-07-24: 21:00:00 296.000 mL via ORAL

## 2023-07-24 MED ORDER — ACETAMINOPHEN 500 MG TABLET
500 | Freq: Three times a day (TID) | ORAL | Status: AC | PRN
Start: 2023-07-24 — End: ?

## 2023-07-24 MED ORDER — BISACODYL 10 MG RECTAL SUPPOSITORY
10 | Freq: Every day | RECTAL | Status: AC | PRN
Start: 2023-07-24 — End: ?

## 2023-07-24 NOTE — Progress Notes
 [facility]             [facility] Internal Medicine Progress NoteAttending Provider: Willie Harry [name]*Hospital Day # 2Subjective: Overnight:- Trazodone  25mg  for sleep x1- RN wanted me to pass along that she thinks the foley was exchanged prior to 5/24 urine CX based on Mark [name]'s ED note on that day. The time stamps make it hard to say if it was actually obtained prior to exchange.Today:- No complaints, but does report some pain over suprapubic area, no pain near foley. Denies fever, SOB, - alert&oriented x4- still having some right hip sorenessObjective  Objective: Vitals:Temp:  [97.4 ?F (36.3 ?C)-99.7 ?F (37.6 ?C)] 97.5 ?F (36.4 ?C)Pulse:  [70-88] 71Resp:  [18-21] 19BP: (101-134)/(45-70) 116/60SpO2:  [97 %-98 %] 98 %Device (Oxygen Therapy): room airWeight: Wt: 07/22/23 82 kg 06/10/23 81.6 kg 06/07/23 84.2 kg  I/O's:Intake/Output Summary (Last 24 hours) at 07/24/2023 0642Last data filed at 07/24/2023 0600Gross per 24 hour Intake 1038.09 ml Output 1625 ml Net -586.91 ml  Physical Exam:BP 116/60  - Pulse 71  - Temp 97.5 ?F (36.4 ?C) (Oral)  - Resp 19  - Ht 5' 11 (1.803 m)  - Wt 82 kg  - SpO2 98%  - BMI 25.21 kg/m? General: pleasant, alert and oriented, in no acute distress HEENT: normocephalic, MMM, EOMI, neck ROM normalCV: RRR, normal S1/S2, 3/6 systolic ejection murmurResp: normal work of breathingGI: soft, not distended, tenderness only in suprapubic region, no tenderness in rest of abdomenNeuro: A&O x3  Assessment & Plan: Mark [name] is a 89 yo man w/ HFpEF (EF 65% 05/10/23), CAD, AAA, HTN, BPH w/ chronic foley (since at least 2024), orthostatic hypotension on midodrine  who presented on 5/23 from his nursing facility with hip pain after a fall. Pt admitted for possible UTI related sepsis, now much improved waiting for AKI to get better and continuing antibiotics. Plan: # Septic shock, resolving# Fever, Leukocytosis, hypotension# UTI UTIs most likely source of infection considering [place] abdomen findings as well as UA.  Patient has chronic Foley which also predispose patient for colonization vs infection.  Of note, patient denies dysuria. CXR and [place] chest without signs of pneumonia.  Blood pressure responds well to IVFs. S/p Zosyn , augmentin . But Ucx from 5/24 showed Proteus vulgaris with beta lactam resistance and nitrofurantoin , susp to cipro and bactrim. Avoiding bactrim d/t AKI- s/p bolus, given MAP of 64- d/c augmentin - start cipro (renal dose) for Proteus, plan for 5d- f/u blood cultures, NGTD- cont home Midodrine  2.5 mg TID - pending repeat Ucx 5/25  # FallPatient does not recall the events leading up to admission. No bruising noted on exam. - Tylenol  1000mg  q8h - lidocaine  patch- voltaren  gel- PT/OT  # AKI on CKD# chronic urinary retention requiring Foley# chronic hydroureteronephrosisFoley changed in ED on arrival, s/p IVF resuscitation. Urine lytes- Daily BMP- Cont home sodium bicarb- Encourage PO intake and IVF  # Other hospital issues- CAD/AAA: cont home statin, DAPT- Mood: cont escitalopram - Neuropathy: cont home gabapentin - GERD: change home omeprazole  for pepcid , should NOT restart omeprazole  on discharge as it decreases effects of plavix  (assuming pt still needs DAPT)- constipation: stool burden noted on [place] - miralax  daily, senna nightly Diet: RenalProphylaxis: HeparinCode status: Full CodeBOWEL REG: (Last BM Last Bowel Movement: 07/21/23)VTE PPX: SQ hepLINES/TUBES: PIVMOBILITY: ambulateDISPO:  pending STRCODE STATUS: Full Discussed and seen with attending. Final plan pending attestation by attending provider: Ballard Bongo, MD Signed:Michelle (Mark [name], MDPGY-1, [facility] Internal Medicine, Primary  careContact via mobile heart beat5/26/2025  Medicine Attending:I saw and evaluated the patient. I agree with the findings and the plan of care as documented in the resident?s note. Worked with PT this morning.Seen later in the day. Calm, alert, comfortable appearing. Foley with clear yellow urine. Cardiac exam with 3/6 syst ej murmur loudest at apex but radiating to carotids bilaterally. Lungs clear.Reviewed micro data. Sens to tobra, cipro, bactrim.Labs reviewedGiven AKI on CKD, would avoid TMP-SMX. Change to Cipro, monitor for adverse effects. Cr downtrending- continue to follow.Dispo pendingJeremy Emelie [name], MDAssociate Professor of MedicineFirm Chief, SLA : [phone]/26/20258:59 PM

## 2023-07-24 NOTE — Plan of Care
 Inpatient Occupational Therapy EvaluationDefault Flowsheet Data (most recent)   IP Adult OT Eval/Treat - 07/24/23 0841    Date of Visit / Treatment  Date of Visit / Treatment 07/24/23   Note Type Evaluation   Progress Report Due 08/07/23   Start Time 0811   End Time 0841   Total Treatment Time 30    General Information  Pertinent History Of Current Problem per chart 89 yo man w/ HFpEF (EF 65% 05/10/23), CAD, AAA, HTN, BPH w/ chronic foley (since at least 2024), orthostatic hypotension on midodrine  who presented on 5/23 from his nursing facility with hip pain after a fall. Pt admitted for possible UTI related sepsis, now much improved waiting for AKI to get better and continuing antibiotics.    Subjective agreeable   Referring Physician  Emelie Hanger   General Observations supine, RA, foley, RN cleared session   Precautions/Limitations Fall Precautions;Bed alarm;Chair alarm    Weight Bearing Status  Weight Bearing Status WNL - Within normal limits    Prior Level of Functioning/Social History  Additional Comments per chart: pt lives with his daughter in a 2 level condo, has 2 chair lifts within the home, ambulates using a rollator, also owns a RW, SC, wheelchair, commode, and shower chair.    Vital Signs and Orthostatic Vital Signs  Vital Signs Free text RA NAD    Pain/Comfort  Pain Comment (Pre/Post Treatment Pain) c/o mod L hip pain, incr c/ mob; RN aware, premedicted    Cognition  Overall Cognitive Status Impaired   Orientation Level Oriented to person;Oriented to place   Level of Consciousness alert   Following Commands Follows one step commands with increased time;Follows one step commands with repetition   Personal Safety / Judgment Fall risk    Vision/ Hearing  Hearing difficulties / Use of hearing aids HOH    Range of Motion  Range of Motion Examination bilateral upper extremity ROM was [facility]    Manual Muscle Testing Manual Muscle Testing Comments grossly deconditioned, BUEs 3/5    Muscle Tone  Muscle Tone Testing Results No muscle tone deficits noted    Coordination  Coordination Comments GMC slowed    Sensory Assessment  Sensory Tests Results No sensory impairment noted    Skin Assessment  Skin Assessment See Nursing Documentation    Balance  Sitting Balance: Static  FAIR       Maintains static position without assist or device, may require Supervision or Verbal Cues (<2 minutes)   Sitting Balance: Dynamic  FAIR-     Performs dynamic activities through partial range (50-75%) with Advice worker Comment EOB    ADL: Lower Body Dressing  Independence/Assistance Level Total assist/dependent   Clothing Items hospital socks   Lower Body Dressing Comments d/t pain    ADL: Toilet Training  Independence limited by Requires assist x2 people   Activity Analysis Unable to complete functional transfers, currently requiring bedpan for toileting needs    ADL: Grooming  Independence/Assistance Level Set-up required   Grooming Activities comb hair;wash face   Grooming Comments seated EOB     AM-PAC - Daily Activity IP Short Form  Help needed from another person putting on/taking off regular lower body clothing 1 - Unable   Help needed from another person for bathing (incl. washing, rinsing, drying) 1 - Unable   Help needed from another person for toileting (incl. using toilet, bedpan, urinal) 1 - Unable   Help needed from another person putting on/taking off  regular upper body clothing 3 - A Little   Help needed from another person taking care of personal grooming such as brushing teeth 2 - A Lot   Help needed from another person eating meals 3 - A Little   AM-PAC Daily Activity Raw Score (Total of rows above) 11    Bed Mobility  Supine-to-Sit Independence/Assistance Level Minimum assist;Assist of 1   Supine-to-Sit Assist Device Head of bed elevated;Bed rails;Draw pad   Sit-to-Supine Independence/Assistance Level Moderate assist;Assist of 1   Sit-to-Supine Assist Device No device   Bed Mobility Comments slowed transitions; tolerated sitting EOB c/ CGA    Sit-Stand Transfer Training  Sit-Stand Transfer Comments declining standing d/t significant pain    Handoff Documentation  Handoff Patient in bed;Bed alarm;Patient instructed to call nursing for mobility;Discussed with nursing    Therapeutic Exercise  Therapeutic Exercise Comments seated: ankle pumps, LAQs, shoulder flex/ext, alternating UE punches    Clinical Impression  Initial Assessment pt tol OT eval fairly. limited by pain, weakness, impaired bal, decr activity tol, and impaired ADL indep. Ax1 for bed mob and seated ADLs. declining fxl transfers d/t pain. will need mod complexity support upon dc    Patient/Family Stated Goals  Patient/Family Stated Goal(s) decrease pain    Frequency/Equipment Recommendations  OT Frequency 3x per week   Next Treatment Expected 07/26/23   OT/OTA completing this assessment Elynn Patteson    OT Recommendations for Inpatient Admission  ADL Recommendations assist of 2   bed level ADLs   Education - Learning Assessment  Education Topic Functional mobility techniques;ADL techniques   Learners Patient   Readiness Acceptance   Method Explanation;Demonstration   Response Needs Reinforcement    OT Discharge Summary  Disposition Recommendation Moderate complexity support and therapy to progress functional mobility/ ADLs/ IADLs recommended for post- acute care.  See assessment for additional details.     Problem: Occupational Therapy GoalsGoal: Occupational Therapy GoalsDescription: OT GOALSPt will ambulate bed<>bathroom w/ min A & approp assistive devicePt will perform all aspects of toilet w/ min APt will improve standing balance to FAIR+ in preparation for sinkside grooming tasksPt will perform LB dressing & bathing w/ min A & AE PRNPt will improve BUE MMT by 1/2 grade in preparation for ADLsOutcome: Interventions implemented as appropriate Catalina Climes, MS, OTR/L

## 2023-07-24 NOTE — Plan of Care
 Problem: Adult Inpatient Plan of CareGoal: Readiness for Transition of CareOutcome: Interventions implemented as appropriate Plan of Care Overview/ Patient StatusPT rec STR for pt., referral sent to [facility] per daughter request.  Our dept will await response from [facility] and if no bed available will get more choices from daughter.  If bed available will need to obtain insurance auth.Flonnie Humphrey, RN, BSN, ACM-RN Case Manager

## 2023-07-24 NOTE — Plan of Care
 Plan of Care Overview/ Patient StatusAssumed care of pt @ 0700 Neuro: A&Ox3, disoriented to situation. Generalized tremor at baselineCardiac: VSS, Denies SOB and chest pain. Hgb 7.1 g/dL, goal is > 7 g/dL per MD Janeen Meckel. Resp: RA, respirations even and unlaboredGI/GU: Abdomen non tender, rounded, +BS, pills whole with water , tolerating renal dietFoley catheter in place draining yellow urine, foley care provided, no BM this shift.MSK: Pt is Ax2 bedrest this shift. Turn & reposition assistance provided Q2 hours. Worked with OT this shift, Ax1 to the edge of bed.Skin: Blanchable redness to bottomPain: c/o L hip pain, lidocaine  patch applied with + effect Safety maintained, call bell and belongings in reach, bed low and locked, hourly rounding and POC ongoing.Problem: Adult Inpatient Plan of CareGoal: Plan of Care ReviewOutcome: Interventions implemented as appropriateGoal: Patient-Specific Goal (Individualized)Outcome: Interventions implemented as appropriateGoal: Absence of Hospital-Acquired Illness or InjuryOutcome: Interventions implemented as appropriateGoal: Optimal Comfort and WellbeingOutcome: Interventions implemented as appropriateGoal: Readiness for Transition of CareOutcome: Interventions implemented as appropriate Problem: WoundGoal: Optimal CopingOutcome: Interventions implemented as appropriateGoal: Optimal Functional AbilityOutcome: Interventions implemented as appropriateGoal: Absence of Infection Signs and SymptomsOutcome: Interventions implemented as appropriateGoal: Improved Oral IntakeOutcome: Interventions implemented as appropriateGoal: Optimal Pain Control and FunctionOutcome: Interventions implemented as appropriateGoal: Skin Health and IntegrityOutcome: Interventions implemented as appropriateGoal: Optimal Wound HealingOutcome: Interventions implemented as appropriate Problem: Skin Injury Risk IncreasedGoal: Skin Health and IntegrityOutcome: Interventions implemented as appropriate Problem: Fall Injury RiskGoal: Absence of Fall and Fall-Related InjuryOutcome: Interventions implemented as appropriate Problem: InfectionGoal: Absence of Infection Signs and SymptomsOutcome: Interventions implemented as appropriate Problem: Physical Therapy GoalsGoal: Physical Therapy GoalsDescription: PT GOALS1. Patient will perform bed mobility with minimal assist x 22. Patient will perform transfers with minimal assist x 2 using appropriate assistive device 3. Patient will ambulate a minimum of 15 feet with minimal assist x 2 using appropriate assistive device Outcome: Interventions implemented as appropriate Problem: Violence Risk or ActualGoal: Anger and Impulse ControlOutcome: Interventions implemented as appropriate Problem: Occupational Therapy GoalsGoal: Occupational Therapy GoalsDescription: OT GOALSPt will ambulate bed<>bathroom w/ min A & approp assistive devicePt will perform all aspects of toilet w/ min APt will improve standing balance to FAIR+ in preparation for sinkside grooming tasksPt will perform LB dressing & bathing w/ min A & AE PRNPt will improve BUE MMT by 1/2 grade in preparation for ADLsOutcome: Interventions implemented as appropriate

## 2023-07-24 NOTE — Plan of Care
 Problem: Adult Inpatient Plan of CareGoal: Plan of Care ReviewOutcome: Interventions implemented as appropriateFlowsheets (Taken 07/24/2023 0202)Plan of Care Reviewed With: patient Plan of Care Overview/ Patient StatusPatient is alert to self, intermittently place. Increasingly confused throughout night asking, Where am I? And Why hasn't anyone fed me? Re-oriented as needed. MD Pach notified and additional sleep regimen given per order with positive effect. Foley in place draining yellow urine, catheter care performed. Turned and repositioned off bony prominences q2 hours. Safety measures maintained. POC reinforced.5/26/20252:09 AMUrine cx resulted in am, MD Pach notified. Continue current regimen per MD.6:03 AMBP 116/60  - Pulse 71  - Temp 97.5 ?F (36.4 ?C) (Oral)  - Resp 19  - Ht 5' 11 (1.803 m)  - Wt 82 kg  - SpO2 98%  - BMI 25.21 kg/m?

## 2023-07-25 LAB — CBC WITH AUTO DIFFERENTIAL
BKR WAM ABSOLUTE IMMATURE GRANULOCYTES.: 0.05 x 1000/ÂµL (ref 0.00–0.30)
BKR WAM ABSOLUTE LYMPHOCYTE COUNT.: 1.19 x 1000/ÂµL (ref 0.60–3.70)
BKR WAM ABSOLUTE NRBC: 0 x 1000/ÂµL (ref 0.00–1.00)
BKR WAM ANC (ABSOLUTE NEUTROPHIL COUNT): 5.06 x 1000/ÂµL (ref 2.00–7.60)
BKR WAM BASOPHIL ABSOLUTE COUNT.: 0.03 x 1000/ÂµL (ref 0.00–1.00)
BKR WAM BASOPHILS: 0.4 % (ref 0.0–1.4)
BKR WAM EOSINOPHIL ABSOLUTE COUNT.: 0.53 x 1000/ÂµL (ref 0.00–1.00)
BKR WAM EOSINOPHILS: 6.9 % — ABNORMAL HIGH (ref 0.0–5.0)
BKR WAM HEMATOCRIT: 23.2 % — ABNORMAL LOW (ref 38.50–50.00)
BKR WAM HEMOGLOBIN: 7.1 g/dL — ABNORMAL LOW (ref 13.2–17.1)
BKR WAM IMMATURE GRANULOCYTES: 0.6 % (ref 0.0–1.0)
BKR WAM LYMPHOCYTES: 15.4 % — ABNORMAL LOW (ref 17.0–50.0)
BKR WAM MCH: 22.5 pg — ABNORMAL LOW (ref 27.0–33.0)
BKR WAM MCHC: 30.6 g/dL — ABNORMAL LOW (ref 31.0–36.0)
BKR WAM MCV: 73.7 fL — ABNORMAL LOW (ref 80.0–100.0)
BKR WAM MONOCYTE ABSOLUTE COUNT.: 0.85 x 1000/ÂµL (ref 0.00–1.00)
BKR WAM MONOCYTES: 11 % (ref 4.0–12.0)
BKR WAM MPV: 9.6 fL (ref 8.0–12.0)
BKR WAM NEUTROPHILS: 65.7 % (ref 39.0–72.0)
BKR WAM NUCLEATED RED BLOOD CELLS: 0 % (ref 0.0–1.0)
BKR WAM PLATELETS: 259 x1000/ÂµL (ref 150–420)
BKR WAM RDW-CV: 18.4 % — ABNORMAL HIGH (ref 11.0–15.0)
BKR WAM RED BLOOD CELL COUNT.: 3.15 M/ÂµL — ABNORMAL LOW (ref 4.00–6.00)
BKR WAM WHITE BLOOD CELL COUNT: 7.7 x1000/ÂµL (ref 4.0–11.0)

## 2023-07-25 LAB — BASIC METABOLIC PANEL
BKR ANION GAP: 13 (ref 7–17)
BKR BLOOD UREA NITROGEN: 49 mg/dL — ABNORMAL HIGH (ref 8–23)
BKR BUN / CREAT RATIO: 12 (ref 8.0–23.0)
BKR CALCIUM: 8.3 mg/dL — ABNORMAL LOW (ref 8.8–10.2)
BKR CHLORIDE: 107 mmol/L (ref 98–107)
BKR CO2: 18 mmol/L — ABNORMAL LOW (ref 20–30)
BKR CREATININE DELTA: 0
BKR CREATININE: 4.1 mg/dL — ABNORMAL HIGH (ref 0.40–1.30)
BKR EGFR, CREATININE (CKD-EPI 2021): 13 mL/min/{1.73_m2} — ABNORMAL LOW (ref >=60)
BKR GLUCOSE: 114 mg/dL — ABNORMAL HIGH (ref 70–100)
BKR POTASSIUM: 4.9 mmol/L (ref 3.3–5.3)
BKR SODIUM: 138 mmol/L (ref 136–144)

## 2023-07-25 LAB — HEPATIC FUNCTION PANEL
BKR A/G RATIO: 0.8 — ABNORMAL LOW (ref 1.0–2.2)
BKR ALANINE AMINOTRANSFERASE (ALT): 18 U/L (ref 9–59)
BKR ALBUMIN: 2.8 g/dL — ABNORMAL LOW (ref 3.6–5.1)
BKR ALKALINE PHOSPHATASE: 143 U/L — ABNORMAL HIGH (ref 9–122)
BKR ASPARTATE AMINOTRANSFERASE (AST): 28 U/L (ref 10–35)
BKR AST/ALT RATIO: 1.6
BKR BILIRUBIN DIRECT: 0.1 mg/dL (ref <=0.2)
BKR BILIRUBIN TOTAL: 0.3 mg/dL (ref <=1.2)
BKR GLOBULIN: 3.5 g/dL (ref 2.0–3.9)
BKR PROTEIN TOTAL: 6.3 g/dL (ref 5.9–8.3)

## 2023-07-25 LAB — PHOSPHORUS     (BH GH L LMW YH): BKR PHOSPHORUS: 4.4 mg/dL (ref 2.2–4.5)

## 2023-07-25 LAB — MAGNESIUM: BKR MAGNESIUM: 2.2 mg/dL (ref 1.7–2.4)

## 2023-07-25 NOTE — Plan of Care
  [facility]-SrcSpiritual Care NoteAssessment:  Religion:ChristianChaplain received pt sitting up  in bed alert to self, pt welcomed chaplains visit and shared that he is looking forward to going into a rehab. Pt has a great family support system in place with 3 kids and grandchildren who are actively involved in pt's POC. Pt during the visit seemed confused chaplain provided spiritual care, comfort and support. Spiritual care will remain in place 24/7Intervention:  Responding Chaplain: On-Call ChaplainConsulted With: NurseVisit and Intervention Type: Consult, Initial, Spiritual Visit Spiritual care interventions provided: Relational or Interpersonal Interventions: : Active Listening and or Reflection, Companionship, Hospitality, Spiritual Support/Presence, Introduction to [employer] Outcome: With the help of the chaplain, patient/loved one(s): Relational or Interpersonal Outcomes:: Established a Relationship with the Chaplain, Felt More at Peace Plan:  07/25/2023 7:01 PM Plan of Care Overview/ Patient Status

## 2023-07-25 NOTE — Other
 Project Relay NoteHelping patients to remain out of the hospital is a priority for [facility], particularly for hospitalized patients who may be near the end-of-life. The Readmissions End-of-Life Action at [facility]) team has identified  Mark [name] [id number] as someone who may benefit from Serious illness conversation by the primary team, Inpatient consult to Geriatrics for serious illness conversations, Outpatient referral to Geriatrics, and Consult to Spiritual Care  .Please be sure to order these during this hospitalization or upon discharge. We have also notified their outpatient clinicians (primary care and specialists if applicable) of their current hospitalization and their 6 admissions in the past year with our recommendations above. Thank you for your continued care of this patient. [facility] Relay Team 07/25/2023 4:14 PM

## 2023-07-25 NOTE — Care Coordination-Inpatient
 Tc spoke to pt daughter regarding no bed offer to [facility] she needs to speak to her dad because he only wants [facility] she will give tc a call later on today.Mark [name] CoordinatorMHB: [phone]

## 2023-07-25 NOTE — Plan of Care
 Problem: Adult Inpatient Plan of CareGoal: Plan of Care ReviewOutcome: Interventions implemented as appropriateFlowsheets (Taken 07/24/2023 0202)Plan of Care Reviewed With: patient Plan of Care Overview/ Patient StatusPatient is alert to self, intermittently place. Not OOB this shift. Magnesium  citrate given in evening in addition to current bowel regimen, incontinent of loose stool later in shift. Incontinence care performed as needed. Foley in place draining yellow urine, catheter care performed. Turned and repositioned off bony prominences q2 hours. Safety measures maintained. POC reinforced. 5/27/20251:25 AMMultiple BMs overnight. Catheter care provided as needed.5:58 AMBP 136/67  - Pulse 73  - Temp 97.2 ?F (36.2 ?C) (Oral)  - Resp 18  - Ht 5' 11 (1.803 m)  - Wt 82 kg  - SpO2 99%  - BMI 25.21 kg/m?

## 2023-07-25 NOTE — Plan of Care
 Problem: Adult Inpatient Plan of CareGoal: Plan of Care ReviewOutcome: Interventions implemented as appropriate Problem: WoundGoal: Optimal Pain Control and FunctionOutcome: Interventions implemented as appropriate Problem: Skin Injury Risk IncreasedGoal: Skin Health and IntegrityOutcome: Interventions implemented as appropriate Problem: Fall Injury RiskGoal: Absence of Fall and Fall-Related InjuryOutcome: Interventions implemented as appropriate Problem: InfectionGoal: Absence of Infection Signs and SymptomsOutcome: Interventions implemented as appropriate Plan of Care Overview/ Patient StatusReceived patient at 0700, A&Ox4, HOH, C/o minimal left Hip pain when moved. On scheduled tylenol  and Voltaren  gel. Incontinent of bowel. Large BM. X 3 during the shift. Held  9 AM Miralax  and Senna. Orthostatic BP checked and negative. BP 126/65  - Pulse 66  - Temp 97.4 ?F (36.3 ?C) (Axillary)  - Resp 20  - Ht 5' 11 (1.803 m)  - Wt 82 kg  - SpO2 100%  - BMI 25.21 kg/m?  Foley draining clear yellow urine. Foley catheter care performed. CHG bath given. Safety maintained.

## 2023-07-26 LAB — BASIC METABOLIC PANEL
BKR ANION GAP: 12 (ref 7–17)
BKR BLOOD UREA NITROGEN: 46 mg/dL — ABNORMAL HIGH (ref 8–23)
BKR BUN / CREAT RATIO: 11.5 (ref 8.0–23.0)
BKR CALCIUM: 8.5 mg/dL — ABNORMAL LOW (ref 8.8–10.2)
BKR CHLORIDE: 107 mmol/L (ref 98–107)
BKR CO2: 20 mmol/L (ref 20–30)
BKR CREATININE DELTA: -0.1
BKR CREATININE: 4 mg/dL — ABNORMAL HIGH (ref 0.40–1.30)
BKR EGFR, CREATININE (CKD-EPI 2021): 13 mL/min/{1.73_m2} — ABNORMAL LOW (ref >=60)
BKR GLUCOSE: 108 mg/dL — ABNORMAL HIGH (ref 70–100)
BKR POTASSIUM: 5 mmol/L (ref 3.3–5.3)
BKR SODIUM: 139 mmol/L (ref 136–144)

## 2023-07-26 LAB — HEPATIC FUNCTION PANEL
BKR A/G RATIO: 0.8 — ABNORMAL LOW (ref 1.0–2.2)
BKR ALANINE AMINOTRANSFERASE (ALT): 22 U/L (ref 9–59)
BKR ALBUMIN: 3 g/dL — ABNORMAL LOW (ref 3.6–5.1)
BKR ALKALINE PHOSPHATASE: 151 U/L — ABNORMAL HIGH (ref 9–122)
BKR ASPARTATE AMINOTRANSFERASE (AST): 33 U/L (ref 10–35)
BKR AST/ALT RATIO: 1.5
BKR BILIRUBIN DIRECT: 0.1 mg/dL (ref <=0.2)
BKR BILIRUBIN TOTAL: 0.2 mg/dL (ref <=1.2)
BKR GLOBULIN: 3.7 g/dL (ref 2.0–3.9)
BKR PROTEIN TOTAL: 6.7 g/dL (ref 5.9–8.3)

## 2023-07-26 LAB — CBC WITH AUTO DIFFERENTIAL
BKR WAM ABSOLUTE IMMATURE GRANULOCYTES.: 0.03 x 1000/ÂµL (ref 0.00–0.30)
BKR WAM ABSOLUTE LYMPHOCYTE COUNT.: 0.98 x 1000/ÂµL (ref 0.60–3.70)
BKR WAM ABSOLUTE NRBC: 0 x 1000/ÂµL (ref 0.00–1.00)
BKR WAM ANC (ABSOLUTE NEUTROPHIL COUNT): 4.12 x 1000/ÂµL (ref 2.00–7.60)
BKR WAM BASOPHIL ABSOLUTE COUNT.: 0.03 x 1000/ÂµL (ref 0.00–1.00)
BKR WAM BASOPHILS: 0.5 % (ref 0.0–1.4)
BKR WAM EOSINOPHIL ABSOLUTE COUNT.: 0.6 x 1000/ÂµL (ref 0.00–1.00)
BKR WAM EOSINOPHILS: 9.3 % — ABNORMAL HIGH (ref 0.0–5.0)
BKR WAM HEMATOCRIT: 23.4 % — ABNORMAL LOW (ref 38.50–50.00)
BKR WAM HEMOGLOBIN: 7.1 g/dL — ABNORMAL LOW (ref 13.2–17.1)
BKR WAM IMMATURE GRANULOCYTES: 0.5 % (ref 0.0–1.0)
BKR WAM LYMPHOCYTES: 15.3 % — ABNORMAL LOW (ref 17.0–50.0)
BKR WAM MCH: 22.7 pg — ABNORMAL LOW (ref 27.0–33.0)
BKR WAM MCHC: 30.3 g/dL — ABNORMAL LOW (ref 31.0–36.0)
BKR WAM MCV: 74.8 fL — ABNORMAL LOW (ref 80.0–100.0)
BKR WAM MONOCYTE ABSOLUTE COUNT.: 0.66 x 1000/ÂµL (ref 0.00–1.00)
BKR WAM MONOCYTES: 10.3 % (ref 4.0–12.0)
BKR WAM MPV: 9 fL (ref 8.0–12.0)
BKR WAM NEUTROPHILS: 64.1 % (ref 39.0–72.0)
BKR WAM NUCLEATED RED BLOOD CELLS: 0 % (ref 0.0–1.0)
BKR WAM PLATELETS: 276 x1000/ÂµL (ref 150–420)
BKR WAM RDW-CV: 18.4 % — ABNORMAL HIGH (ref 11.0–15.0)
BKR WAM RED BLOOD CELL COUNT.: 3.13 M/ÂµL — ABNORMAL LOW (ref 4.00–6.00)
BKR WAM WHITE BLOOD CELL COUNT: 6.4 x1000/ÂµL (ref 4.0–11.0)

## 2023-07-26 LAB — PHOSPHORUS     (BH GH L LMW YH): BKR PHOSPHORUS: 4.9 mg/dL — ABNORMAL HIGH (ref 2.2–4.5)

## 2023-07-26 LAB — MAGNESIUM: BKR MAGNESIUM: 2.3 mg/dL (ref 1.7–2.4)

## 2023-07-26 MED ORDER — ACETAMINOPHEN 500 MG TABLET
500 | Freq: Once | ORAL | Status: CP
Start: 2023-07-26 — End: ?
  Administered 2023-07-26: 18:00:00 500 mg via ORAL

## 2023-07-26 NOTE — Progress Notes
 [facility]            [facility] Internal Medicine Progress NoteAttending Provider: Ballard Bongo, MDHospital Day # 4Subjective: Overnight:- NAEOToday:- alert and oriented, feeling good and slept well- pain was okay during PT and the current patch and tylenol  has been helping- denies fever, SOB, ab painObjective  Objective: Vitals:Temp:  [97.3 ?F (36.3 ?C)-98.7 ?F (37.1 ?C)] 97.6 ?F (36.4 ?C)Pulse:  [64-79] 71Resp:  [17-20] 17BP: (121-170)/(64-74) 170/74SpO2:  [98 %-100 %] 99 %Device (Oxygen Therapy): room airWeight: Wt: 07/22/23 82 kg 06/10/23 81.6 kg 06/07/23 84.2 kg  I/O's:Intake/Output Summary (Last 24 hours) at 07/26/2023 0644Last data filed at 07/26/2023 0426Gross per 24 hour Intake 480 ml Output 2200 ml Net -1720 ml  Physical Exam:BP (!) 170/74  - Pulse 71  - Temp 97.6 ?F (36.4 ?C) (Oral)  - Resp 17  - Ht 5' 11 (1.803 m)  - Wt 82 kg  - SpO2 99%  - BMI 25.21 kg/m? General: pleasant, alert and oriented, in no acute distress HEENT: normocephalic, MMM, EOMI, neck ROM normalCV: RRR, normal S1/S2, 3/6 systolic ejection murmurResp: normal work of breathing, some crackles in left lower lung GI: soft, not distended, no tenderness to palpationNeuro: A&O x3LABS: Recent Labs Lab 05/26/250923 05/27/250509 05/28/250613 WBC 9.0 7.7 6.4 HGB 7.1* 7.1* 7.1* HCT 23.50* 23.20* 23.40* PLT 227 259 276  Recent Labs Lab 05/24/250550 PTT 31.6* LABPROT 12.4* INR 1.16*  Recent Labs Lab 05/26/250923 05/27/250509 05/28/250613 NEUTROPHILS 75.9* 65.7 64.1 LYMPHOCYTES 9.4* 15.4* 15.3* MONOCYTES 8.4 11.0 10.3 EOSINOPHILS 5.4* 6.9* 9.3* MONOABS 0.75 0.85 0.66 EOSABS 0.48 0.53 0.60 BASOABS 0.03 0.03 0.03  Recent Labs   05/26/250801 05/27/250509 NA 137 138 K 4.9 4.9 CL 106 107 CO2 18* 18* ANIONGAP 13 13 BUN 51* 49* CREATININE 4.10* 4.10* CALCIUM  8.1* 8.3* MG 1.9 2.2 PHOS 4.4 4.4  Recent Labs   05/26/250801 05/27/250509 PROT 6.7 6.3 ALBUMIN  2.8* 2.8* GLOB 3.9 3.5 BILITOT 0.3 0.3 BILIDIR 0.2 0.1 AST 36* 28 ALT 18 18 ASALR 2.0 1.6 ALKPHOS 150* 143*  Recent Labs   05/25/251601 05/26/250801 05/27/250509 GLU 140* 106* 114*  No results for input(s): PHART, PCO2ART, PO2ART, O2SATART, HCO3ART, PHVEN, PCO2VEN, PO2VEN, O2SATVEN, HCO3VEN in the last 72 hours. No results for input(s): TROPONINT, BNPPRO in the last 72 hours. No results for input(s): SPECGRAV, PHUR, BLOODU, PROTEINUA, GLUCOSEUR, KETONESU, BILIRUBINUR, UROBILINOGEN, LEUKOCYTESUR, WBCUA, NITRITE, BACTERIA, EPITHELIALCE, RBCUA, COGRCA in the last 72 hours.Invalid input(s): BACTUA, EPIUA, MUCOUSUA Microbiology:UCx: >=100,000 CFU/mL Proteus vulgarisSusceptibility Proteus vulgaris  MIC SUSCEPTIBILITY  Ciprofloxacin Susceptible  Nitrofurantoin  Resistant  Tobramycin Susceptible  Trimethoprim + Sulfamethoxazole Susceptible No results for input(s): SPECGRAV, PHUR, BLOODU, PROTEINUA, GLUCOSEUR, KETONESU, BILIRUBINUR, UROBILINOGEN, LEUKOCYTESUR, WBCUA, NITRITE, BACTERIA, EPITHELIALCE, RBCUA, COGRCA in the last 72 hours.Invalid input(s): BACTUA, EPIUA, MUCOUSUALab Results Component Value Date  LABBLOO No Growth to Date 07/22/2023  LABBLOO No Growth to Date 07/22/2023  LABBLOO Abnormal Stain (AA) 06/09/2023  LABBLOO Aerobic Bottle Coagulase negative Staphylococcus (AA) 06/09/2023  LABBLOO Anaerobic Bottle Coagulase negative Staphylococcus (AA) 06/09/2023  LABURIN No Growth 07/23/2023  LABURIN >=100,000 CFU/mL Proteus vulgaris (A) 07/22/2023  LABURIN >=100,000 CFU/mL Klebsiella pneumoniae (A) 06/09/2023 Lab Results Component Value Date  LEGIONELLAAG Negative 07/22/2023  SPNEUMONIAEU Positive (A) 07/22/2023 No results found for this or any previous visit.Virology: Lab Results Component Value Date  SARSCOV2 Negative 07/22/2023  SARSCOV2 Positive (A) 06/09/2023  SARSCOV2 Negative 05/08/2023  SARSCOV2 Negative 02/24/2023  SARSCOV2 Negative 07/16/2020  SARSCOV2 Negative 03/09/2019 Lab Results Component Value  Date  SARSCOV2 Negative 07/22/2023 RVP: Lab Results Component Value Date  INFLUENZAART Negative 07/22/2023  INFLUENZBRT Negative 07/22/2023 ImagingNo results found.  Assessment & Plan: Mark [name] is a 89 yo man w/ HFpEF (EF 65% 05/10/23), CAD, AAA, HTN, BPH w/ chronic foley (since at least 2024), orthostatic hypotension on midodrine  who presented on 5/23 from his nursing facility with hip pain after a fall. Pt admitted for possible UTI related sepsis, now much improved waiting for AKI to get better and continuing antibiotics. Clinically improving and waiting for STR placement. Plan: # Septic shock, resolving# Fever, Leukocytosis, hypotension# Complicated UTI Patient with chronic Foley, Ucx from 5/24 showed Proteus vulgaris with beta lactam resistance and nitrofurantoin , susp to cipro and bactrim. CXR and Mingus chest without signs of pneumonia. Blood pressure responds well to IVFs. S/p Zosyn , augmentin . Avoiding bactrim d/t AKI.- s/p bolus, given MAP of 64- start cipro (renal dose) for Proteus, plan for 10d (D1: 5/26) until 6/2- cont home Midodrine  2.5 mg TID # FallPatient does not recall the events leading up to admission. No bruising noted on exam. - Tylenol  1000mg  q8h - lidocaine  patch- voltaren  gel- PT/OT  # AKI on CKD# chronic urinary retention requiring Foley# chronic hydroureteronephrosisFoley changed in ED on arrival, s/p IVF resuscitation. Urine lytes showed FeNA 1.2% indeterminate.- Daily BMP- Cont home sodium bicarb- Encourage PO intake and IVF  # Acute on chronic anemiaNo active bleeding. But slowly worsened anemia now stable hbg in 7's. Some concern for , no hematoma evident on exam- transfusion goal >7# Other hospital issues- CAD/AAA: cont home statin, DAPT- Mood: cont escitalopram - Neuropathy: cont home gabapentin - GERD: change home omeprazole  for pepcid , should NOT restart omeprazole  on discharge as it decreases effects of plavix  (assuming pt still needs DAPT)- constipation: miralax  BID, senna BID Diet: RenalProphylaxis: HeparinCode status: Full CodeBOWEL REG: miralax  BID, senna BID ( Last Bowel Movement: 07/25/23)VTE PPX: SQ hepLINES/TUBES: PIVMOBILITY: ambulateDISPO:  pending STR placementCODE STATUS: Full Discussed and seen with attending. Final plan pending attestation by attending provider: Ballard Bongo, MD Signed:Michelle Janeen Meckel, MDPGY-1, [facility], Primary careContact via mobile heart beat5/28/2025 Medicine Attending:I saw and evaluated the patient. I agree with the findings and the plan of care as documented in the resident?s note. No changes to clinical status today. Continue cipro to tx complicated UTI. Awaiting dispo options.Tonette Franco, MDAssociate Professor of MedicineFirm Chief, SLA : [phone]/28/202510:08 PM

## 2023-07-26 NOTE — Plan of Care
 Plan of Care Overview/ Patient StatusPt Herson Prichard, a 89 y.o. is AO x 4, can be intermittently confused. Calm and cooperative with care. Patient reporting pain 8/10 to hip, scheduled tylenol  given with positive effect. VSS on room air, Lungs diminished to auscultation. No tele. Denies chest pain and shortness of breath.Abd soft. nontender, +BS, Last BM 5/28. Pills whole with water . Tolerating renal diet. Foley in place draining clear yellow urine.Bedrest - assist x2 to turn and reposition in bed. Skin intact. No other complaints at this time. Bed alarm on and audible. Bed low and locked, call bell within reach. Safety maintained. Standard precautions maintained. POC ongoing. See flowsheets for additional information.Jerris More, RNBP 126/66  - Pulse 71  - Temp 98.1 ?F (36.7 ?C) (Oral)  - Resp 18  - Ht 5' 11 (1.803 m)  - Wt 82 kg  - SpO2 100%  - BMI 25.21 kg/m? Electronically Signed by Jerris More, RN, Jul 26, 2023

## 2023-07-26 NOTE — Plan of Care
 [facility]-SrcSpiritual Care NoteAssessment:  Religion:ChristianUnit chaplain received an email from Patient Relations, via PSM, relaying pt's request for assistance completing a Living Will.  Introduced myself and my role to Mark [name], who welcomed the visit.  Explored the nature of his request for advanced care planning support, and he denied having called Patient Relations, saying he would not have asked for help completing a living will because he recently did that with the help of a lawyer outside of the hospital.  Per previous provider notes, it appears that Mark [name] (with the support of his daughter, Mark [name]) might be considering a code status change.  We did not explore this specifically today, deferring until we can see what was recently documented.  Overall, Mark [name] today expressed feeling mostly satisfied with current interventions, and said he feels well cared for in the hospital.  He does not like being in STR, though he did not seem ready to put further limitations on his care. Called daughter, Mark [name], to learn more about the request from Patient Relations, and to encourage her to bring existing ACP documentation to the hospital to be scanned into pt's chart.  Left vm.Intervention:  Referral Source: Patient, Pt. & Guest RelationsResponding Chaplain: Unit ChaplainConsulted With: NurseVisit and Intervention Type: Advance Directive Spiritual care interventions provided: Relational or Interpersonal Interventions: : Active Listening and or ReflectionAdvance Directive Interventions:: Assistance with Advance Directive Paperwork Outcome: With the help of the chaplain, patient/loved one(s): Relational or Interpersonal Outcomes:: Established a Relationship with the ChaplainAdvance Directive Outcomes:: Reviewed Advanced Directive Information Plan:  Chaplain remains available if the pt wants to complete new ACP documents, but currently awaiting call back from his daughter for further investigation about existing documents.  On-call chaplain support remains available as needed, on Mobile Heartbeat at [phone]. Ethelene Herald, BCCUnit [phone] 07/26/2023 1:40 PM

## 2023-07-26 NOTE — Plan of Care
 Plan of Care Overview/ Patient StatusPt alert and oriented to self and place, hoh. Bedrest during this shift, assistance needed to turn and position as needed. Foley cath in place, c/d/I. No c/o pain, sob, nad noted. VSS on RA. BP 132/64  - Pulse 71  - Temp 97.5 ?F (36.4 ?C) (Oral)  - Resp 18  - Ht 5' 11 (1.803 m)  - Wt 82 kg  - SpO2 98%  - BMI 25.21 kg/m?  All safety maintained, call bell within reach, bed alarm on, bed in lowest position. POC ongoing. Lizette Righter, RN

## 2023-07-26 NOTE — ACP (Advance Care Planning)
 Unit chaplain received an email from Patient Relations, via PSM, relaying pt's request for assistance completing a Living Will.  Introduced myself and my role to Mark [name], who welcomed the visit.  Explored the nature of his request for advanced care planning support, and he denied having called Patient Relations, saying he would not have asked for help completing a living will because he recently did that with the help of a lawyer outside of the hospital.  Per previous provider notes, it appears that Mark [name] (with the support of his daughter, Mark [name]) might be considering a code status change.  We did not explore this specifically today, deferring until we can see what was recently documented.  Overall, Mark [name] today expressed feeling mostly satisfied with current interventions, and said he feels well cared for in the hospital.  He does not like being in STR, though he did not seem ready to put further limitations on his care. Called daughter, Mark [name], to learn more about the request from Patient Relations, and to encourage her to bring existing ACP documentation to the hospital to be scanned into pt's chart.  Left vm.Rabbi Earnesteen Gobble, BCCUnit [phone]

## 2023-07-26 NOTE — Plan of Care
 Plan of Care Overview/ Patient StatusInpatient Physical Therapy Evaluation IP Adult PT Eval/Treat - 07/26/23 0955    Date of Visit / Treatment  Date of Visit / Treatment 07/26/23   Note Type Progress Note   Start Time 930   End Time 955   Total Treatment Time 25    General Information  Subjective  I haven't walked in three weeks   General Observations supine in bed, alert and willing to work   Precautions/Limitations Fall Precautions;Bed alarm;Chair alarm    Weight Bearing Status  Weight Bearing Status WNL - Within normal limits    Vital Signs and Orthostatic Vital Signs  Vital Signs Free text RA, NAD    Pain/Comfort  Pain Comment (Pre/Post Treatment Pain) c/o min/mod L hip pain with mobility, nsging is aware    Skin Assessment  Skin Assessment See Nursing Documentation    Balance  Sitting Balance: Static  FAIR       Maintains static position without assist or device, may require Supervision or Verbal Cues (<2 minutes)   Sitting Balance: Dynamic  FAIR-     Performs dynamic activities through partial range (50-75%) with Contact Guard   Standing Balance: Static FAIR-      Contact Guard to maintain static position with no Assistive Device   Standing Balance: Dynamic  POOR+   Moves through 1/2 range with minimal assist to right self   Balance Assist Device Rolling walker    Bed Mobility  Supine-to-Sit Independence/Assistance Level Minimum assist;Assist of 1   Supine-to-Sit Assist Device Head of bed elevated;Draw pad;Bed rails   Sit-to-Supine Independence/Assistance Level Minimum assist   Sit-to-Supine Assist Device Bed rails;Draw pad    Sit-Stand Transfer Training  Sit-to-Stand Transfer Independence/Assistance Level Minimum assist;Moderate assist   Sit-to-Stand Transfer Assist Device Rolling walker   Stand-to-Sit Transfer Independence/Assistance Level Minimum assist;Moderate assist   Stand-to-Sit Transfer Assist Device Rolling walker   Transfer Safety Analysis Impairments impaired balance;pain;decreased strength;decreased endurance/activity tolerance    Gait Training  Independence/Assistance Level  Minimum assist;Moderate assist;Assist of 1   Assistive Device  Rolling walker   Gait Distance --   3 steps  Gait Analysis Deviations decreased cadence;increased time in double stance;decreased velocity of limb motion;decreased step length;decreased stride length;antalgic gait   Gait Analysis Impairments impaired balance;pain;decreased strength;decreased endurance/activity tolerance   Ambulation distance was limited by: pain   Gait Training Comments slow, antalgic pattern, easily fatigued,    Handoff Documentation  Handoff Patient in bed;Bed alarm;Patient instructed to call nursing for mobility;Discussed with nursing    PT- AM-PAC - Basic Mobility Screen- How much help from another person do you currently need.....  Turning from your back to your side while in a a flat bed without using rails? 3 - A Little - Requires a little help (supervision, minimal assistance). Can use assistive devices.   Moving from lying on your back to sitting on the side of a flat bed without using bed rails? 2 - A Lot - Requires a lot of help (maximum to moderate assistance). Can use assistive devices.   Moving to and from a bed to a chair (including a wheelchair)? 2 - A Lot - Requires a lot of help (maximum to moderate assistance). Can use assistive devices.   Standing up from a chair using your arms(e.g., wheelchair or bedside chair)? 2 - A Lot - Requires a lot of help (maximum to moderate assistance). Can use assistive devices.   To walk in a hospital room? 1 - Total - Requires  total assistance or cannot do it at all.   Climbing 3-5 steps with a railing? 1 - Total - Requires total assistance or cannot do it at all.   AMPAC Mobility Score 11   TARGET Highest Level of Mobility Mobility Level 4, Transfer to chair ACTUAL Highest Level of Mobility Mobility Level 5, Stand for 1 minute    Clinical Impression  Follow up Assessment Pt tolerated treatment well. Pt was min A x 1 for bed mobility, min/mod A x 1 for transfers and gait with RW. Pt amb with slow, antalgic pattern and easily fatigued. Pt is currently moderate complexity.   Criteria for Skilled Therapeutic Interventions Met yes;treatment indicated   Rehab Potential good, to achieve stated therapy goals    Patient/Family Stated Goals  Patient/Family Stated Goal(s) decrease pain    Frequency/Equipment Recommendations  PT Frequency 3x per week   Next Treatment Expected 07/28/23   PT/PTA completing this assessment Ed    PT Recommendations for Inpatient Admission  Activity/Level of Assist out of bed;transfers only;assist of 1;with rolling walker;in room    Planned Treatment / Interventions  Education Treatment / Interventions Patient Education / Training   role of PT, POC   PT Discharge Summary  Physical Therapy Disposition Recommendation Moderate complexity support and therapy to progress functional mobility/ ADLs/ IADLs recommended for post- acute care.  See assessment for additional details.   Additional Therapy Recommendations Physical Therapy Services in Discharge Environment   Equipment Recommendations for Discharge Mark [name] - The patient will use a Rolling Walker in the home and outside daily to provide greater stability and safer ambulation for participation in ADLs     Waynard Hailstone,  DPT  07/26/2023

## 2023-07-26 NOTE — Progress Notes
 [facility]            [place] [facility] Internal Medicine Progress NoteAttending Provider: Ballard Bongo, MDHospital Day # 3Subjective: Overnight:- NAEOToday:- A&Ox4, feeling good, slept well ovn- pain is okay since he hasn't moved around yet, it gets bad when he moves his hip but lidocaine  patches and tylenol  has been helping- denies fevers, abd painObjective  Objective: Vitals:Temp:  [97.2 ?F (36.2 ?C)-98.9 ?F (37.2 ?C)] 97.2 ?F (36.2 ?C)Pulse:  [73-79] 73Resp:  [16-19] 18BP: (123-145)/(64-71) 136/67SpO2:  [94 %-99 %] 99 %Device (Oxygen Therapy): room airWeight: Wt: 07/22/23 82 kg 06/10/23 81.6 kg 06/07/23 84.2 kg  I/O's:Intake/Output Summary (Last 24 hours) at 07/25/2023 0644Last data filed at 07/25/2023 0458Gross per 24 hour Intake 360 ml Output 1950 ml Net -1590 ml  Physical Exam:BP 136/67  - Pulse 73  - Temp 97.2 ?F (36.2 ?C) (Oral)  - Resp 18  - Ht 5' 11 (1.803 m)  - Wt 82 kg  - SpO2 99%  - BMI 25.21 kg/m? General: pleasant, alert and oriented, in no acute distress HEENT: normocephalic, MMM, EOMI, neck ROM normalCV: RRR, normal S1/S2, 3/6 systolic ejection murmurResp: normal work of breathingGI: soft, not distended, no tenderness to palpationNeuro: A&O x3  Assessment & Plan: Mark [name] is a 89 yo man w/ HFpEF (EF 65% 05/10/23), CAD, AAA, HTN, BPH w/ chronic foley (since at least 2024), orthostatic hypotension on midodrine  who presented on 5/23 from his nursing facility with hip pain after a fall. Pt admitted for possible UTI related sepsis, now much improved waiting for AKI to get better and continuing antibiotics. Clinically improving and waiting for STR placement. Plan: # Septic shock, resolving# Fever, Leukocytosis, hypotension# UTI Patient with chronic Foley, Ucx from 5/24 showed Proteus vulgaris with beta lactam resistance and nitrofurantoin , susp to cipro and bactrim. CXR and [place] chest without signs of pneumonia. Blood pressure responds well to IVFs. S/p Zosyn , augmentin . Avoiding bactrim d/t AKI. - s/p bolus, given MAP of 64- d/c augmentin - start cipro (renal dose) for Proteus, plan for 10d (D1: 5/26)- cont home Midodrine  2.5 mg TID # FallPatient does not recall the events leading up to admission. No bruising noted on exam. - Tylenol  1000mg  q8h - lidocaine  patch- voltaren  gel- PT/OT  # AKI on CKD# chronic urinary retention requiring Foley# chronic hydroureteronephrosisFoley changed in ED on arrival, s/p IVF resuscitation. Urine lytes showed FeNA 1.2% indeterminate.- Daily BMP- Cont home sodium bicarb- Encourage PO intake and IVF  # Acute on chronic anemiaNo active bleeding. But slowly worsened anemia now stable hbg in 7's. Some concern for , no hematoma evident on exam- transfusion goal >7# Other hospital issues- CAD/AAA: cont home statin, DAPT- Mood: cont escitalopram - Neuropathy: cont home gabapentin - GERD: change home omeprazole  for pepcid , should NOT restart omeprazole  on discharge as it decreases effects of plavix  (assuming pt still needs DAPT)- constipation: stool burden noted on [place] - miralax  BID, senna BID Diet: RenalProphylaxis: HeparinCode status: Full CodeBOWEL REG: miralax  BID, senna BID ( Last Bowel Movement: 07/25/23)VTE PPX: SQ hepLINES/TUBES: PIVMOBILITY: ambulateDISPO:  pending STR placementCODE STATUS: Full Discussed and seen with attending. Final plan pending attestation by attending provider: Ballard Bongo, MD Signed:Michelle (Mark [name], MDPGY-1, [facility] Internal Medicine, Primary careContact via mobile heart beat5/27/2025

## 2023-07-26 NOTE — Care Coordination-Inpatient
 TC LEFT A VM FOR PT DAUGHTER REGARDING MORE CHOICES FOR STR BED TC WILL TRY AGAIN AT A LATER TIMEYolanda GarnerTransition CoordinatorMHB: [phone]:13pmDaughter called back she would like referral to go to [facility] CoordinatorMHB: [phone]

## 2023-07-27 LAB — BLOOD CULTURE
BKR BLOOD CULTURE: NO GROWTH
BKR BLOOD CULTURE: NO GROWTH

## 2023-07-27 MED ORDER — CIPROFLOXACIN 500 MG TABLET
500 | ORAL | Status: DC
Start: 2023-07-27 — End: 2023-07-29
  Administered 2023-07-28 – 2023-07-29 (×2): 500 mg via ORAL

## 2023-07-27 NOTE — Plan of Care
 Inpatient Occupational Therapy Progress NoteDefault Flowsheet Data (most recent)   IP Adult OT Eval/Treat - 07/27/23 1105    Date of Visit / Treatment  Date of Visit / Treatment 07/27/23   Note Type Progress Note   Progress Report Due 08/10/23   Start Time 1045   End Time 1105   Total Treatment Time    General Information  Subjective Agreeable to OT   General Observations supine in bed, (+) foley   Precautions/Limitations Fall Precautions;Bed alarm;Chair alarm    Weight Bearing Status  Weight Bearing Status WNL - Within normal limits    Vital Signs and Orthostatic Vital Signs  Vital Signs Free text RA, NAD    Pain/Comfort  Pain Comment (Pre/Post Treatment Pain) no c/o pain    Patient Coping  Observed Emotional State accepting    Cognition  Overall Cognitive Status Impaired   Level of Consciousness alert   Personal Safety / Judgment Impulsive;Fall risk;Poor safety awareness    Skin Assessment  Skin Assessment See Nursing Documentation    Balance  Sitting Balance: Static  FAIR       Maintains static position without assist or device, may require Supervision or Verbal Cues (<2 minutes)   Sitting Balance: Dynamic  FAIR-     Performs dynamic activities through partial range (50-75%) with Contact Guard   Standing Balance: Static FAIR-      Contact Guard to maintain static position with no Assistive Device   Standing Balance: Dynamic  POOR+   Moves through 1/2 range with minimal assist to right self   Balance Assist Device Rolling walker    Activities of Daily Living  Activities of Daily Living Comments patient requires Ax1 at current functional status    ADL: Lower Body Dressing  Independence/Assistance Level Maximum assist   Clothing Items hospital socks   Independence limited by Weakness;Impaired balance;Impaired trunk control   Activity Analysis Demonstrates impaired balance with standing components;Demonstrates impaired mobility impacting independence with lower body dressing   Lower Body Dressing Comments maxA to adjust socks seated EOB    ADL: Toilet Training  Activity Analysis Unable to ambulate to bathroom, currently requiring bedside commode for toileting needs    ADL: Eating/Self-Feeding  Independence/Assistance Level Minimum assist   Independence limited by Impaired fine motor skills   Eating/Self Feeding Comments minA to open container     AM-PAC - Daily Activity IP Short Form  Help needed from another person putting on/taking off regular lower body clothing 2 - A Lot   Help needed from another person for bathing (incl. washing, rinsing, drying) 2 - A Lot   Help needed from another person for toileting (incl. using toilet, bedpan, urinal) 2 - A Lot   Help needed from another person putting on/taking off regular upper body clothing 3 - A Little   Help needed from another person taking care of personal grooming such as brushing teeth 2 - A Lot   Help needed from another person eating meals 3 - A Little   AM-PAC Daily Activity Raw Score (Total of rows above) 14   CMS Score (based on Raw Score - with G Code) 14 - 59.67% impaired      (G Code - CK)    Bed Mobility  Supine-to-Sit Independence/Assistance Level Contact guard   Supine-to-Sit Assist Device Bed rails;Head of bed elevated   Bed Mobility Comments effortful    Sit-Stand Transfer Training  Sit-to-Stand Transfer Independence/Assistance Level Minimum assist   Sit-to-Stand Transfer Assist Device Rolling  walker   Stand-to-Sit Transfer Independence/Assistance Level Minimum assist   Stand-to-Sit Transfer Assist Device Rolling walker   Transfer Safety Analysis Impairments impaired balance;decreased strength;decreased endurance/activity tolerance   Occupation-Based Indications: Sit to Stand Transfers To simulate toilet transfers;To simulate lower body dressing tasks;To be able to hike pants/underwear to hips in standing   Limitations to Sit to Stand Transfers for Occupation-Based Tasks Weakness;Impaired safety awareness;Impaired activity tolerance;Balance deficits   Stand-Sit Transfer Comments pt impulsive, attempting to stand/transfer to recliner w/o RW, cues for hand placement and safety sequencing    Functional Ambulation  Independence/Assistance Level  Minimum assist;Moderate assist   Assistive Device  Rolling walker   Gait Distance bed to chair   Gait Analysis Impairments impaired balance;decreased strength;decreased endurance/activity tolerance   Occupation-based Indications:  Functional Ambulation To improve activity tolerance for daily routines;To improve balance and increase safety with daily routines   Limitations to functional ambulation for occupation-based tasks Impaired safety awareness;Balance deficits   Functional Ambulation Comments pt impulsive during transfer; mildly unsteady & retropulsive, min LOB w/ minA to regain balance & to demonstrate safe and effective transfer    Handoff Documentation  Handoff Patient instructed to call nursing for mobility;Discussed with nursing;Patient in chair;Chair alarm    Therapeutic Functional Activity  Interventions to support occupations Engaged in functional transfers in preparation for ADL tasks;Challenged activity tolerance to increase independence with ADLs    Clinical Impression  Rehab Diagnosis Weakness   Initial Assessment Pt tolerated OT fairly. Pt currently performs transfers and ADLs with RW with Ax1 at current functional status. Pt is limited by decreased safety awareness, impaired balance, decreased functional activity tolerance, and impaired ADL independence. Recommend moderate complexity support once medically cleared.   Criteria for Skilled Therapeutic Interventions Met yes;treatment indicated   Rehab Potential good, to achieve stated therapy goals    Frequency/Equipment Recommendations  OT Frequency 3x per week   Next Treatment Expected 07/28/23   OT/OTA completing this assessment Camilo Cella    OT Recommendations for Inpatient Admission  ADL Recommendations bedside commode;assist of 1;with rolling walker    Education - Learning Assessment  Education Topic Functional mobility techniques;ADL techniques;Safety;Therapy role/rehab process   Learners Patient   Readiness Acceptance   Method Explanation   Response Verbalizes Understanding    Planned Treatment / Interventions  Education Treatment / Interventions Patient Education / Training    OT Discharge Summary  Disposition Recommendation Moderate complexity support and therapy to progress functional mobility/ ADLs/ IADLs recommended for post- acute care.  See assessment for additional details.   Equipment Recommendations for Discharge To be determined pending progress     Problem: Occupational Therapy GoalsGoal: Occupational Therapy GoalsDescription: OT GOALSPt will ambulate bed<>bathroom w/ min A & approp assistive devicePt will perform all aspects of toilet w/ min APt will improve standing balance to FAIR+ in preparation for sinkside grooming tasksPt will perform LB dressing & bathing w/ min A & AE PRNPt will improve BUE MMT by 1/2 grade in preparation for ADLsOutcome: Interventions implemented as appropriate Amber Juniper, OTR/L

## 2023-07-27 NOTE — Plan of Care
 Plan of Care Overview/ Patient Status7P-11PPatient a&ox2. Foley in place, draining yellow urine. Patient complained of minimal hip pain, voltaren  applied. Patient medically ready awaiting bed. Meds per mar. BP 129/64  - Pulse (!) 111  - Temp 98.1 ?F (36.7 ?C) (Oral)  - Resp 17  - Ht 5' 11 (1.803 m)  - Wt 82 kg  - SpO2 99%  - BMI 25.21 kg/m? Call bell in reach, bed alarm on, bed locked in lowest position. Marylyn Sofia, RN

## 2023-07-27 NOTE — Care Coordination-Inpatient
 PENDING INSURANCE AUTHORIZATION THIS PATIENT CANNOT BE DISCHARGED UNTIL DETERMINATION OBTAINEDSecured facility: [facility] Auth has been initiated with this patient's insurer. Clinical information submitted to this patient's insurer for review today. We now await the authorization that will allow this patient to transfer to the facility.   Insurance Contact: Home and CommunityPhone: [phone]: Pending Case No.: [id number] [facility] NilanTransition CoordinatorCare Management DepartmentYale-New [facility] [place] Phone: [phone]

## 2023-07-27 NOTE — ACP (Advance Care Planning)
 Chaplain received return call from pt's daughter, Amalia Badder, confirming that the Will pt had completed outside of the hospital did not address medical decision making, and so she encouraged chaplain to try to help pt with ACP documentation today.  Returned to Falcon' room, and he was amenable to appointing his daughter, Amalia Badder, to be his healthcare representative, and his granddaughter, Lavonne Prairie, to be the alternate, as he thought he had already done.  The form was signed by Royston Cornea in front of two witnesses.  The original was given to the [facility] to scan into pt's chart, and chaplain left a copy in pt's room for Amalia Badder to receive tomorrow when she visits.  Demographic info updated accordingly.Amalia Badder expressed her concern that Hence might not fully understand the nuances of various forms of life support, and that he tends to say leave it to my daughter to decide, but she would like to hear guidance from her father.  Attempted to engage pt in conversation about a Living Will for Treatment Preferences, with Amalia Badder over speaker phone for support, but pt had a hard time hearing and deferred to tomorrow morning, when Amalia Badder can visit in person.  Updated covering provider about the plan.  Blank Living Will left in pt's room.Rabbi Earnesteen Gobble, BCCUnit [phone]

## 2023-07-27 NOTE — Care Coordination-Inpatient
 TC SPOKE TO PT DAUGHTER SUSAN SHE ACCEPTED BED TO [facility] FOR [facility] WILL UPDATE MEDICAL TEAM AND ROUTE TO Kin Penner CoordinatorMHB: [phone]

## 2023-07-27 NOTE — Plan of Care
 Plan of Care Overview/ Patient StatusPatient alert and confused, oriented to self only. HOH. Not OOB this shift. Repositioned with assistance. C/o of left hip pain, scheduled tylenol  and voltaran gel with temporary effect relief. No signs of acute respiratory distress, denies SOB or chest pain. Chronic foley - yellow clear urine. Foley care provided. BP elevated this evening SBP 15-'s covering provider Pach. Scheduled midodrine  held. Bed alarm on. Safety maintained. Call bell within reach. Bed lowered and locked. Will continue to monitor.

## 2023-07-27 NOTE — Plan of Care
 [place] [facility]-SrcSpiritual Care NoteAssessment:  Religion:ChristianChaplain received return call from pt's daughter, Mark [name], confirming that the Will pt had completed outside of the hospital did not address medical decision making, and so she encouraged chaplain to try to help pt with ACP documentation today.  Returned to Mark [name]' room, and he was amenable to appointing his daughter, Mark [name], to be his healthcare representative, and his granddaughter, Mark [name], to be the alternate, as he thought he had already done.  The form was signed by Mark [name] in front of two witnesses.  The original was given to the [facility] to scan into pt's chart, and chaplain left a copy in pt's room for Mark [name] to receive tomorrow when she visits.  Demographic info updated accordingly.Mark [name] expressed her concern that Mark [name] might not fully understand the nuances of various forms of life support, and that he tends to say leave it to my daughter to decide, but she would like to hear guidance from her father.  Attempted to engage pt in conversation about a Living Will for Treatment Preferences, with Mark [name] over speaker phone for support, but pt had a hard time hearing and deferred to tomorrow morning, when Mark [name] can visit in person.  Updated covering provider about the plan.  Blank Living Will left in pt's room.Intervention:  Referral Source: (P) FamilyResponding Chaplain: (P) Unit ChaplainConsulted With: (P) Nurse, PhysicianVisit and Intervention Type: (P) Advance Directive Spiritual care interventions provided: Relational or Interpersonal Interventions: : (P) Empathy, Support for Family/Loved OneAdvance Directive Interventions:: (P) Assistance with Advance Directive Paperwork Outcome: With the help of the chaplain, patient/loved one(s): Relational or Interpersonal Outcomes:: (P) Established a Relationship with the ChaplainMeaning-Making Outcomes:: (P) Clarified Their Beliefs/Life StoryAdvance Directive Outcomes:: (P) Executed an Advance Directive, Reviewed Advanced Directive Information Plan:  Continuing to make progress with ACP.  Pt's daughter might benefit from additional caregiver support.Mark [name], BCCUnit [phone] 07/27/2023 3:48 PM

## 2023-07-27 NOTE — Plan of Care
 Problem: Adult Inpatient Plan of CareGoal: Readiness for Transition of CareOutcome: Interventions implemented as appropriate Plan of Care Overview/ Patient StatusADDENDUM 1601Per facility, patient will need to contact the business office prior to being re-admitted to discuss previous billing which will need to be settled prior to re-admission. Provider updated. CM to follow-up with patient's family.Yancey Helena, MSN, RN, [facility] ManagerYNHH Case Management DepartmentPer insurance team, patient has received insurance authorization. [facility] updated -  sent inquiry to [facility] via EPIC and phone call regarding ability to accept patient today (07/27/23). Awaiting facility response.Provider team updated.CM Dept will continue to follow for post acute needs.Yancey Helena, MSN, RN, [facility] ManagerYNHH Case Management Department

## 2023-07-27 NOTE — Care Coordination-Inpatient
 INSURANCE AUTH OBTAINED FOR STR 07/27/23 1002 Authorization Information Date Authorization Initiated:  07/27/23 Time Authorization Initiated: 1003 Mode Clinical was sent: Portal Facility Name [facility] Facility Authorization Yes, [place] updated Insurance Company [employer] Medicare Insurance Co. Solicitor Name/# Home and [employer] Authorization #/Details [id number], 5 Days, 5/29-6/2 Date Authorization recieved 07/27/23 Time Authorization recieved 1426 Follow up contact necessary Yes Contact Name Home and Community Contact phone: [phone] [facility] NilanTransition CoordinatorCare Management DepartmentYale-[facility] [place], [place] Phone: [phone]

## 2023-07-27 NOTE — Plan of Care
 Plan of Care Overview/ Patient StatusPt is A&Ox2. Disoriented to place and situation. Hypertensive- Midodrine  not given. MD made aware. Pt on RA. Lungs diminished. Pt on renal diet. Takes pills whole with water .Pt OOB Ax2 to chair. Call bell in reach, bed alarm on, bed lowBP (!) 138/59  - Pulse 85  - Temp 97.3 ?F (36.3 ?C) (Oral)  - Resp 17  - Ht 5' 11 (1.803 m)  - Wt 82 kg  - SpO2 97%  - BMI 25.21 kg/m?

## 2023-07-28 NOTE — Plan of Care
 Plan of Care Overview/ Patient StatusPatient is alert and oriented x2 .HOH. All care and medications explained. Pt is able to make needs known. Resting in between care. Safety maintained- bed in low position and call bell in reach. Will continue to monitor.Mark Tomaso, RN.now5/30/2025

## 2023-07-28 NOTE — Progress Notes
 Progress NoteRFA: complicated UTILOS: 5IE: No acute eventsSubjective: curious about which facility he will be going to, no complaintsVitals: more hyptertensive 160s-170s systolics, normal HR, on RAExam: appears age, responds with cuing, AxO2 (thought it was December), missing few front teeth, wearing glasses, systolic murmur, unlabored breathing, soft abdomen, foley in place without signs of overlying cellulitis, no lower extremity edema.Assessment: Mark [name] is 89 years old with history of BPH with chronic foley, orthostatic hypotension on midodrine , CVD history (AAA, THN, CAD, HFpEF), who presented from SNF after soft fall with initial concern for uro-septic shock, since resolved, now being treated for ciprofloxacin -sensitive Proteus complicated urinary tract infection. Overall is medically stable for discharge, awaiting insurance authorization for short-term rehab facility# Proteus Complicated UTITx: today completes 7 days of complicated UTI therapy (pending confirmation with ID Pharm)5/23-25: pip-tazo -- ID pharmacist will get back regarding whether or not this will cover given AmpC organism5/26-5/29: ciprofloxacinBalance of plan remains Marya Smack, MD, MPHInternal Medicine and Pediatrics, PGY-4[facility] HospitalContact on MHBFor further details see attending addendum

## 2023-07-28 NOTE — Plan of Care
 Plan of Care Overview/ Patient StatusAssumed care of pt @ 0700 Neuro: A&Ox3, disoriented to situation. HOH. Generalized tremor at baselineCardiac: VSS, Denies SOB and chest pain. Hgb 7.1 g/dL, goal is > 7 g/dL per MD. Resp: RA, respirations even and unlaboredGI/GU: Abdomen non tender, rounded, +BS, pills whole with water , tolerating renal dietFoley catheter in place draining yellow urine, foley care provided, LBM 5/29.MSK: Pt is Ax2 OOB, turn and pivot to the chair with weak and unsteady gait. Turn & reposition assistance provided Q2 hours. Skin: Blanchable redness to bottomPain: c/o L hip pain, lidocaine  patch applied with + effect Safety maintained, call bell and belongings in reach, bed low and locked, hourly rounding and POC ongoing.Problem: Adult Inpatient Plan of CareGoal: Plan of Care ReviewOutcome: Interventions implemented as appropriateGoal: Patient-Specific Goal (Individualized)Outcome: Interventions implemented as appropriateGoal: Absence of Hospital-Acquired Illness or InjuryOutcome: Interventions implemented as appropriateGoal: Optimal Comfort and WellbeingOutcome: Interventions implemented as appropriateGoal: Readiness for Transition of CareOutcome: Interventions implemented as appropriate Problem: WoundGoal: Optimal CopingOutcome: Interventions implemented as appropriateGoal: Optimal Functional AbilityOutcome: Interventions implemented as appropriateGoal: Absence of Infection Signs and SymptomsOutcome: Interventions implemented as appropriateGoal: Improved Oral IntakeOutcome: Interventions implemented as appropriateGoal: Optimal Pain Control and FunctionOutcome: Interventions implemented as appropriateGoal: Skin Health and IntegrityOutcome: Interventions implemented as appropriateGoal: Optimal Wound HealingOutcome: Interventions implemented as appropriate Problem: Skin Injury Risk IncreasedGoal: Skin Health and IntegrityOutcome: Interventions implemented as appropriate Problem: Fall Injury RiskGoal: Absence of Fall and Fall-Related InjuryOutcome: Interventions implemented as appropriate Problem: InfectionGoal: Absence of Infection Signs and SymptomsOutcome: Interventions implemented as appropriate Problem: Physical Therapy GoalsGoal: Physical Therapy GoalsDescription: PT GOALS1. Patient will perform bed mobility with minimal assist x 22. Patient will perform transfers with minimal assist x 2 using appropriate assistive device 3. Patient will ambulate a minimum of 15 feet with minimal assist x 2 using appropriate assistive device Outcome: Interventions implemented as appropriate Problem: Violence Risk or ActualGoal: Anger and Impulse ControlOutcome: Interventions implemented as appropriate Problem: Occupational Therapy GoalsGoal: Occupational Therapy GoalsDescription: OT GOALSPt will ambulate bed<>bathroom w/ min A & approp assistive devicePt will perform all aspects of toilet w/ min APt will improve standing balance to FAIR+ in preparation for sinkside grooming tasksPt will perform LB dressing & bathing w/ min A & AE PRNPt will improve BUE MMT by 1/2 grade in preparation for ADLsOutcome: Interventions implemented as appropriate

## 2023-07-28 NOTE — Progress Notes
 [facility]	 [facility]	Internal Medicine Progress Note 5/30/2025Hospital Day: 6Attending Provider: Ballard Bongo, MDSubjective: Mark [name]: - cont cipro  (completed 7 days of effective abx)- accepted at [facility] - expected plan for dc 5/30Overnight:- naeonAM:- doing well overall. Still has difficulty walking with PT, feels unsteady. But he continues to work with them. He notes decrease in appetite that has been stable for him. He notes his sister (?daughter) is coming by today. Notes wanting to leave for STRMeds: Scheduled Meds: Current Facility-Administered Medications Medication Dose Route Frequency Provider Last Rate Last Admin  aspirin  EC delayed release tablet 81 mg  81 mg Oral Daily Wizner, Christy Crandall, MD   81 mg at 07/27/23 0825  chlorhexidine  gluconate (HIBICLENS /BETASEPT ) 4 % topical liquid   Topical (Top) Daily Savioli, Miranda Lynn, MD   Given at 07/26/23 1015  ciprofloxacin  HCl (CIPRO ) tablet 500 mg  500 mg Oral Q24H Leatrice Provost, MD      clopidogreL  (PLAVIX ) tablet 75 mg  75 mg Oral Daily Matt Song, MD   75 mg at 07/27/23 0825  diclofenac  (VOLTAREN ) 1 % gel 4 g  4 g Topical (Top) TID Janeen Meckel, MD   4 g at 07/27/23 2112  escitalopram  oxalate (LEXAPRO ) tablet 10 mg  10 mg Oral Daily Wizner, Christy Crandall, MD   10 mg at 07/27/23 0825  ethyl alcohol  62 % nasal swab 1 Application  1 Application Nasal Q12H Savioli, Miranda Lynn, MD   1 Application at 07/27/23 2111  famotidine  (PEPCID ) tablet 20 mg  20 mg Oral Daily Wizner, Casey Lane, MD   20 mg at 07/27/23 0825  gabapentin  (NEURONTIN ) capsule 200 mg  200 mg Oral Nightly Wizner, Christy Crandall, MD   200 mg at 07/27/23 2111  heparin  (PORCINE) injection 5,000 Units  5,000 Units Subcutaneous Q8H Wizner, Christy Crandall, MD   5,000 Units at 07/27/23 2111  lidocaine  4 % topical patch 1 patch  1 patch Transdermal Q24H Leilani Punter Praise, MD   1 patch at 07/27/23 1610  melatonin tablet 6 mg  6 mg Oral Nightly Pach, Jolanta Jennifer, MD   6 mg at 07/27/23 2111  [Held by provider] midodrine  (PROAMATINE ) tablet 2.5 mg  2.5 mg Oral TID Leatrice Provost, MD   2.5 mg at 07/26/23 1454  polyethylene glycol (MIRALAX ) packet 17 g  17 g Oral BID Artelia Bijou, MD   17 g at 07/24/23 2108  rosuvastatin  (CRESTOR ) tablet 40 mg  40 mg Oral Daily Wizner, Christy Crandall, MD   40 mg at 07/27/23 0825  senna (SENOKOT) tablet 8.6 mg  1 tablet Oral BID Artelia Bijou, MD   8.6 mg at 07/27/23 0825  sodium bicarbonate  tablet 650 mg  650 mg Oral TID Matt Song, MD   650 mg at 07/27/23 2111  sodium chloride  0.9 % flush 3 mL  3 mL IV Push Q8H Wizner, Christy Crandall, MD   3 mL at 07/23/23 1414 Continuous Infusions: PRN Meds: acetaminophen , bisacodyL , camphor-menthoL , sodium chlorideObjective: Vitals:Temp:  [97.3 ?F (36.3 ?C)-98.1 ?F (36.7 ?C)] 98.1 ?F (36.7 ?C)Pulse:  [63-111] 73Resp:  [17-18] 17BP: (124-174)/(59-80) 130/65SpO2:  [96 %-100 %] 96 %Device (Oxygen Therapy): room air  I/O's:Intake/Output Summary (Last 24 hours) at 07/28/2023 0541Last data filed at 07/27/2023 2100Gross per 24 hour Intake 960 ml Output 1850 ml Net -890 ml  Physical Exam: GEN: Sitting in bed, no apparent acute distressHEENT:  NCAT, EOMI,CV: RRR, no murmursPULM: CTAB, no rhonchi or wheezingABD: +BS, no tenderness on palpation of 4 quadrantsEXT:  warm, no edemaNEURO: moving 4 extremities, responding appropriatelyLabs: No lab draw orderRecent Labs Lab 05/26/250923 05/27/250509 05/28/250613 WBC 9.0 7.7 6.4 HGB 7.1* 7.1* 7.1* HCT 23.50* 23.20* 23.40* PLT 227 259 276  Recent Labs Lab 05/26/250923 05/27/250509 05/28/250613 NEUTROPHILS 75.9* 65.7 64.1  Recent Labs Lab 05/26/250801 05/27/250509 05/28/250613 NA 137 138 139 K 4.9 4.9 5.0 CL 106 107 107 CO2 18* 18* 20 BUN 51* 49* 46* CREATININE 4.10* 4.10* 4.00* GLU 106* 114* 108* ANIONGAP 13 13 12   Recent Labs Lab 05/26/250801 05/27/250509 05/28/250613 CALCIUM  8.1* 8.3* 8.5* MG 1.9 2.2 2.3 PHOS 4.4 4.4 4.9*  Recent Labs Lab 05/26/250801 05/27/250509 05/28/250613 ALT 18 18 22  AST 36* 28 33 ALKPHOS 150* 143* 151* BILITOT 0.3 0.3 0.2 BILIDIR 0.2 0.1 0.1  Recent Labs Lab 05/24/250550 PTT 31.6* LABPROT 12.4* INR 1.16*  Microbiology:Recent Labs Lab 05/24/250550 LABBLOO No Growth after 5 days of incubation - No Growth after 5 days of incubation Recent Labs Lab 05/24/250147 05/25/251841 LABURIN >=100,000 CFU/mL Proteus vulgaris* No Growth No results for input(s): LOWERRESPIRA in the last 168 hours.No results found for this or any previous visit (from the past 24 hours).No results found for this or any previous visit (from the past 24 hours).Imaging: No results found.Assessment & Plan: 89 yo male with PMH chronic foley 2/2 BPH, CVD (h/o AAA, CAD, HFpEF), orthostatic hypotension (home midodrine , held in hosp),  presenting after recurrent falls, found to have sepsis 2/2 UTI s/p 2 day course zosyn , now on ciproflox given susc grid, with STR [facility], but dispo pending outstanding fees at facility#Sepsis, resolving#fever, leukocytosis, hypotension, resolving#Complicated 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 Currently continuing to be asymptomatic, afebrile, nonleukocytic while on cipro . No other overt sources of infection including clear [facility] chest. Blood pressure stable s/p IVF in recent days. Vanc 5/24Zosyn 5/23-5/25Augmentin 5/25Ciproflox 5/26 to 5/29- avoiding bactrim given AKI on CKD- pharmacy confirmed pip-tazo was effective abx regimen, meaning patient received total 7 day course of effective abx by 5/29. - cipro  started 5/26, plan for 10 day course (ending 6/2? 6/1?)- held home midodrine  2.5mg  TID given BP wnl.   Can consider holding on dc until visiting PCP OP#Fall Likely multifactorial given h/o orthostatic hypotension, found UTI, elderly adult. No bruising fractures seen on imaging- Pain regimen: 	- Tylenol  1000mg  q8 hr 	- lidocaine  patch	- voltaren 	- PT/OT- plan for STR to regain strength, balance. Accepted at [facility]#CKD#Chronic urinary retention requiring foley#chronic hydroureteronephrosisFoley exchange in ED upon arrival. 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 Prior attempts at follow-up with nephrology unsuccessful. Will attempt follow-up upon dc with nephrology- BMP has remained stable at possible new baseline cr: 4.  - cont home sodium bicarb- encourage PO intake, IVF if not adequate[x ] place follow-up in dc tab, HAC consultChronic: #CAD/AAA- cont home statin , DAPT- follow-up cardio to confirm need for DAPT#MDD- resumed home lexapro #GERD- at home on omeprazole  but concern for causing decreased efficacy of clopidogrel , plan to start pantoprazole instead. #bowel reg: - miralax  BID, Senna BID#DIET:renal diet#PPX:plavix , heparin ,  pepcid , miralax #ACCESS:Foley, no PIV#DISPO:pending clinical course#CODE STATUS:Full CodeSignedShambavi Johneisha Broaden, MDYale Internal Medicine/Primary Care, PGY-1Reachable through [facility]

## 2023-07-28 NOTE — Plan of Care
 Inpatient Physical Therapy Progress Note IP Adult PT Eval/Treat - 07/28/23 1045    Date of Visit / Treatment  Date of Visit / Treatment 07/28/23   Note Type Daily Note   Start Time 1020   End Time 1045   Total Treatment Time 25    General Information  Subjective I dont need a walker, Im fine without it   Referring Physician  Dr. Emelie Hanger   General Observations Pt met supine in the bed, Foley, RN cleared, NAD   Precautions/Limitations Fall Precautions;Bed alarm;Chair alarm   Precautions/Limitations Comment HOH    Weight Bearing Status  Weight Bearing Status WNL - Within normal limits    Vital Signs and Orthostatic Vital Signs  Vital Signs Free text RA, NAD    Pain/Comfort  Pain Comment (Pre/Post Treatment Pain) No c/o pain    Patient Coping  Observed Emotional State accepting    Cognition  Level of Consciousness alert   Following Commands Follows all commands and directions without difficulty;Follows one step commands with repetition   Administrator, arts / Judgment Impulsive;Fall risk    Vision/ Hearing  Hearing difficulties / Use of hearing aids [facility]    Skin Assessment  Skin Assessment See Nursing Documentation    Balance  Sitting Balance: Static  FAIR       Maintains static position without assist or device, may require Supervision or Verbal Cues (<2 minutes)   Sitting Balance: Dynamic  FAIR-     Performs dynamic activities through partial range (50-75%) with Contact Guard   Standing Balance: Static FAIR-      Contact Guard to maintain static position with no Assistive Device   Standing Balance: Dynamic  POOR+   Moves through 1/2 range with minimal assist to right self   Balance Assist Device Rolling walker    Bed Mobility  Supine-to-Sit Independence/Assistance Level Contact guard   Supine-to-Sit Assist Device Bed rails;Head of bed elevated   Bed Mobility, Impairments decreased endurance/activity tolerance;strength decreased    Sit-Stand Transfer Training  Symptoms Noted During/After Treatment Marketing executive) none   Sit-to-Stand Transfer Independence/Assistance Level Moderate assist;Assist of 1 person + 1 person to manage equipment   Sit-to-Stand Transfer Assist Device No device   Stand-to-Sit Transfer Independence/Assistance Level Minimum assist;Assist of 1   Stand-to-Sit Transfer Assist Device Rolling walker   Transfer Safety Analysis Concerns cues for hand placement   Transfer Safety Analysis Impairments impaired balance;decreased strength;decreased endurance/activity tolerance   Sit-Stand Transfer Comments Pt denied RW use    Gait Training  Independence/Assistance Level  Minimum assist;Assist of 1 person + 1 person to manage equipment   Assistive Device  Rolling walker   Gait Distance 10 feet;15 feet   Gait Analysis Impairments impaired balance;decreased strength;decreased endurance/activity tolerance   Gait Training Comments Easily fatigued, chair follow needed    Handoff Documentation  Handoff Patient in chair;Chair alarm;Patient instructed to call nursing for mobility;Discussed with nursing    PT- AM-PAC - Basic Mobility Screen- How much help from another person do you currently need.....  Turning from your back to your side while in a a flat bed without using rails? 3 - A Little - Requires a little help (supervision, minimal assistance). Can use assistive devices.   Moving from lying on your back to sitting on the side of a flat bed without using bed rails? 3 - A Little - Requires a little help (supervision, minimal assistance). Can use assistive devices.   Moving to and from a bed to a chair (  including a wheelchair)? 3 - A Little - Requires a little help (supervision, minimal assistance). Can use assistive devices.   Standing up from a chair using your arms(e.g., wheelchair or bedside chair)? 2 - A Lot - Requires a lot of help (maximum to moderate assistance). Can use assistive devices.   To walk in a hospital room? 2 - A Lot - Requires a lot of help (maximum to moderate assistance). Can use assistive devices.   Climbing 3-5 steps with a railing? 2 - A Lot - Requires a lot of help (maximum to moderate assistance). Can use assistive devices.   AMPAC Mobility Score 15   TARGET Highest Level of Mobility Mobility Level 4, Transfer to chair   ACTUAL Highest Level of Mobility Mobility Level 6, Walk 10+ steps    Clinical Impression  Follow up Assessment Pt tolerated the session well. Pt requires the use of a RW to perform proper and safe ambulation. LOB observed, Min/Modx1 required for sit to stand transfer. Decreased activity tolerance, chair follow needed. Verbal cues required to promote safety. Continute with moderate complexity support.   Criteria for Skilled Therapeutic Interventions Met yes;treatment indicated   Rehab Potential good, to achieve stated therapy goals    Patient/Family Stated Goals  Patient/Family Stated Goal(s) feel better;decrease pain    Frequency/Equipment Recommendations  PT Frequency 3x per week   Next Treatment Expected 07/31/23   PT/PTA completing this assessment Alekzander Cardell   Equipment Needs During Admission/Treatment Rolling walker    PT Recommendations for Inpatient Admission  Activity/Level of Assist transfers only;ambulate;assist of 1;minimum assistance;moderate assistance;with rolling walker    Education - Learning Assessment   Education Topic Safety   Learners Patient   Readiness Acceptance   Method Explanation;Demonstration   Response Verbalizes Understanding;Needs Reinforcement;Demonstrated Understanding    PT Discharge Summary  Physical Therapy Disposition Recommendation Moderate complexity support and therapy to progress functional mobility/ ADLs/ IADLs recommended for post- acute care.  See assessment for additional details.   Additional Therapy Recommendations Physical Therapy Services in Discharge Environment   Equipment Recommendations for Discharge [facility] - The patient will use a Rolling Walker in the home and outside daily to provide greater stability and safer ambulation for participation in ADLs     Latoiya Maradiaga, PT, DPTPlan of Care Overview/ Patient Status

## 2023-07-28 NOTE — Plan of Care
 Inpatient Occupational Therapy Progress NoteDefault Flowsheet Data (most recent)   IP Adult OT Eval/Treat - 07/28/23 1051    Date of Visit / Treatment  Date of Visit / Treatment 07/28/23   Note Type Daily Note   Progress Report Due 08/10/23   Start Time 1026   End Time 1051   Total Treatment Time 25    General Information  Subjective I dont need a walker   General Observations Pt received supine in bed, Foley, RN cleared tx, agreeable   Precautions/Limitations Fall Precautions;Bed alarm;Chair alarm   Precautions/Limitations Comment HOH    Weight Bearing Status  Weight Bearing Status WNL - Within normal limits    Vital Signs and Orthostatic Vital Signs  Vital Signs Free text RA, NAD    Pain/Comfort  Pain Comment (Pre/Post Treatment Pain) No reports of pain    Cognition  Level of Consciousness alert   Following Commands Follows one step commands with increased time;Follows one step commands with repetition   Administrator, arts / Judgment Impulsive;Fall risk;Decreased recognition / insight of own deficits    Vision/ Hearing  Hearing difficulties / Use of hearing aids [facility]    Skin Assessment  Skin Assessment See Nursing Documentation    Balance  Sitting Balance: Static  FAIR       Maintains static position without assist or device, may require Supervision or Verbal Cues (<2 minutes)   Sitting Balance: Dynamic  FAIR-     Performs dynamic activities through partial range (50-75%) with Contact Guard   Standing Balance: Static FAIR-      Contact Guard to maintain static position with no Assistive Device   Standing Balance: Dynamic  POOR+   Moves through 1/2 range with minimal assist to right self   Balance Assist Device Rolling walker   Balance Skills Training Comment A1+1     AM-PAC - Daily Activity IP Short Form  Help needed from another person putting on/taking off regular lower body clothing 2 - A Lot   Help needed from another person for bathing (incl. washing, rinsing, drying) 2 - A Lot   Help needed from another person for toileting (incl. using toilet, bedpan, urinal) 2 - A Lot   Help needed from another person putting on/taking off regular upper body clothing 3 - A Little   Help needed from another person taking care of personal grooming such as brushing teeth 2 - A Lot   Help needed from another person eating meals 4 - None   AM-PAC Daily Activity Raw Score (Total of rows above) 15   CMS Score (based on Raw Score - with G Code) 15 - 56.46% impaired      (G Code - CK)    Bed Mobility  Supine-to-Sit Independence/Assistance Level Contact guard   Supine-to-Sit Assist Device Bed rails;Head of bed elevated   Bed Mobility Comments effortful, cues for sequencing    Sit-Stand Transfer Training  Sit-to-Stand Transfer Independence/Assistance Level Moderate assist;Assist of 1 person + 1 person to manage equipment   Sit-to-Stand Transfer Assist Device No device   Stand-to-Sit Transfer Independence/Assistance Level Minimum assist;Assist of 1   Stand-to-Sit Transfer Assist Device Rolling walker   Transfer Safety Analysis Concerns cues for hand placement   Transfer Safety Analysis Impairments impaired balance;pain;decreased strength;decreased endurance/activity tolerance   Occupation-Based Indications: Sit to Stand Transfers To simulate toilet transfers;To simulate the use of grab bars in bathroom   Sit-Stand Transfer Comments pt not wanting to utilzie RW, attempted mob with no device required  Mod A for balance    Functional Ambulation  Independence/Assistance Level  Minimum assist;Assist of 1 person + 1 person to manage equipment   Assistive Device  Rolling walker   Gait Distance 10 feet;15 feet   Gait Analysis Impairments impaired balance;decreased endurance/activity tolerance;decreased strength   Occupation-based Indications:  Functional Ambulation To improve activity tolerance for daily routines;To improve balance and increase safety with daily routines   Limitations to functional ambulation for occupation-based tasks Impaired safety awareness   Functional Ambulation Comments chair follow, fwd flexed, impulsive with mob, sl off balance    Handoff Documentation  Handoff Patient in chair;Chair alarm;Pressure relief cushion;Patient instructed to call nursing for mobility;Discussed with nursing    Therapeutic Functional Activity  Interventions to support occupations Engaged in functional transfers in preparation for ADL tasks;Engaged in balance tasks to improve ability to complete ADL tasks;Challenged activity tolerance to increase independence with ADLs    Clinical Impression  Follow up Assessment Pt tol OT tx well, demos dec insight, impaired safety awerness, impaired balance, dec strength, dec act tol, dec flexibility requires assist for ADLs and func mob at RW level. cont to rec moderate complexity support when medically ready.   Rehab Potential good, to achieve stated therapy goals    Patient/Family Stated Goals  Patient/Family Stated Goal(s) decrease pain    Frequency/Equipment Recommendations  OT Frequency 3x per week   Next Treatment Expected 08/01/23   OT/OTA completing this assessment LG    OT Recommendations for Inpatient Admission  ADL Recommendations assist of 1;assist to bathroom;with rolling walker    Education - Learning Assessment  Education Topic Functional mobility techniques;Safety;Therapy role/rehab process;Energy conservation   Learners Patient   Readiness Acceptance   Method Explanation   Response Needs Reinforcement    OT Discharge Summary  Disposition Recommendation Moderate complexity support and therapy to progress functional mobility/ ADLs/ IADLs recommended for post- acute care.  See assessment for additional details.   Equipment Recommendations for Discharge To be determined pending progress Mark [name], Mark [name] of Care Overview/ Patient Status

## 2023-07-29 NOTE — Other
 Called daughter, gave update on patient's medical status. STR placement continuing to remain the main barrier to dispo. Will reach out to CM.

## 2023-07-29 NOTE — Plan of Care
 Plan of Care Overview/ Patient StatusPt Maclain Cohron, a 89 y.o. is AO x 3, confused to situation, forgetful, hard of hearing, hearing aids at the bedside, patient not reporting pain this shift, denies needing pain medication, VSS on RA, Lungs diminished to auscultation. Abd soft, +BS, Last BM 07/27/2023. Tolerating renal diet. Pills whole with water . OOB with an assist x2, uses RW. Witnessed patient T & R self in bed at times, additional pillows provided and turning Q2H. Blanchable redness to gluteal cleft, barrier creams applied. No other complaints at this time. Bed alarm set. Bed low and locked, call bell within reach. Safety maintained. Standard precautions maintained. Will CTM. See flowsheets for additional information.BP 139/69  - Pulse 70  - Temp 97.6 ?F (36.4 ?C) (Oral)  - Resp 20  - Ht 5' 11 (1.803 m)  - Wt 82 kg  - SpO2 100%  - BMI 25.21 kg/m? Electronically Signed by Pepper Boyer, RN, Jul 29, 2023

## 2023-07-29 NOTE — Progress Notes
 [facility]	 [facility]	Internal Medicine Progress Note 5/31/2025Hospital Day: 7Attending Provider: Ballard Bongo, MDSubjective: Carolin Chyle: - cont cipro  (completed 7 days of effective abx)- [facility], [place]:- naeonAM:- sleeping comfortably. Will reassess later in the dayObjective: Vitals:Temp:  [97.4 ?F (36.3 ?C)-98.2 ?F (36.8 ?C)] 98.2 ?F (36.8 ?C)Pulse:  [57-81] 74Resp:  [16-18] 17BP: (129-152)/(67-75) 139/69SpO2:  [93 %-98 %] 96 %Device (Oxygen Therapy): room air  I/O's:Intake/Output Summary (Last 24 hours) at 07/29/2023 0502Last data filed at 07/28/2023 2126Gross per 24 hour Intake -- Output 1400 ml Net -1400 ml  Physical Exam: GEN: sleeping in bed, no apparent acute distressHEENT:  NCAT, EOMI,CV: RRR, no murmursPULM: clear anterior lung fieldsEXT: warm, no edemaLabs: No lab draw orderRecent Labs Lab 05/26/250923 05/27/250509 05/28/250613 WBC 9.0 7.7 6.4 HGB 7.1* 7.1* 7.1* HCT 23.50* 23.20* 23.40* PLT 227 259 276  Recent Labs Lab 05/26/250923 05/27/250509 05/28/250613 NEUTROPHILS 75.9* 65.7 64.1  Recent Labs Lab 05/26/250801 05/27/250509 05/28/250613 NA 137 138 139 K 4.9 4.9 5.0 CL 106 107 107 CO2 18* 18* 20 BUN 51* 49* 46* CREATININE 4.10* 4.10* 4.00* GLU 106* 114* 108* ANIONGAP 13 13 12   Recent Labs Lab 05/26/250801 05/27/250509 05/28/250613 CALCIUM  8.1* 8.3* 8.5* MG 1.9 2.2 2.3 PHOS 4.4 4.4 4.9*  Recent Labs Lab 05/26/250801 05/27/250509 05/28/250613 ALT 18 18 22  AST 36* 28 33 ALKPHOS 150* 143* 151* BILITOT 0.3 0.3 0.2 BILIDIR 0.2 0.1 0.1  Recent Labs Lab 05/24/250550 PTT 31.6* LABPROT 12.4* INR 1.16*  Microbiology:Recent Labs Lab 05/24/250550 LABBLOO No Growth after 5 days of incubation - No Growth after 5 days of incubation Recent Labs Lab 05/25/251841 LABURIN No Growth No results for input(s): LOWERRESPIRA in the last 168 hours.No results found for this or any previous visit (from the past 24 hours).No results found for this or any previous visit (from the past 24 hours).Imaging: No results found.Assessment & Plan: 89 yo male with PMH chronic foley 2/2 BPH, CVD (h/o AAA, CAD, HFpEF), orthostatic hypotension (home midodrine , held in hosp),  presenting after recurrent falls, found to have sepsis 2/2 UTI s/p total 7 day course effective abx, now medically ready for discharge with STR accepted at [facility], but dispo pending family coordination with the facility#Sepsis, resolving#fever, leukocytosis, hypotension, resolving#Complicated UTIGrowing proteus vulgaris (AmpC organism), susc to cipro , bactrim, resistant to macrobid . Currently continuing to be asymptomatic, afebrile, nonleukocytic while on cipro . No other overt sources of infection including clear [place] chest. Blood pressure stable to high in recent days. Vanc 5/24 Zosyn  5/23-5/25Augmentin 5/25Ciproflox 5/26 to 5/29- avoiding bactrim given AKI on CKD- pharmacy confirmed pip-tazo was effective abx regimen, meaning patient received total 7 day course of effective abx by 5/29. - held home midodrine  2.5mg  TID given BP wnl.  Can consider holding on dc until visiting PCP OP#Fall Likely multifactorial given h/o orthostatic hypotension, found UTI, elderly adult. No bruising fractures seen on imaging.  No current complaints of pain while on current regimen- Pain regimen: 	- Tylenol  1000mg  q8 hr PRN	- lidocaine  patch	- voltaren  scheduled	- PT/OT- plan for STR to regain strength, balance. Accepted at [facility]#CKD#Chronic urinary retention requiring foley#chronic hydroureteronephrosisFoley exchange in ED upon arrival. 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 Prior attempts at follow-up with nephrology unsuccessful. Will attempt follow-up upon dc with nephrology- BMP has remained stable at possible new baseline cr: 4.  - cont home sodium bicarb- encourage PO intake, IVF if not adequate[x ] placed follow-up in dc tab, HAC consult --> 6/18 appt with Dr. GondalChronic: #CAD/AAA-  cont home statin , DAPT- follow-up cardio to confirm need for DAPT#MDD- resumed home lexapro #GERD- at home on omeprazole  but concern for causing decreased efficacy of clopidogrel , plan to start pantoprazole on dc vs pepcid  on dc. #bowel reg: - miralax  BID, Senna BID- last BM: 5/29#DIET:renal diet#PPX:plavix , heparin ,  pepcid , miralax #ACCESS:Foley, no PIV#DISPO:pending clinical course#CODE STATUS:Full CodeSignedShambavi Kniyah Khun, MDYale Internal Medicine/Primary Care, PGY-1Reachable through [facility]

## 2023-07-29 NOTE — Plan of Care
 Problem: Adult Inpatient Plan of CareGoal: Readiness for Transition of CareOutcome: Interventions implemented as appropriate Plan of Care Overview/ Patient StatusCM spoke to daughter - [facility] 1st choice. CM resent referral - and awaiting response. Provider updated. Family aware of need to accept bed; receive authorization. Provider team updated.Yancey Helena, MSN, RN, [facility] ManagerYNHH Case Management Department

## 2023-07-29 NOTE — Plan of Care
 Plan of Care Overview/ Patient StatusPatient is A&OX3, is forgetful, is able to communicate his needs with no difficulty. Elevated SBP (145) at 2338, is asymptomatic. Denies of pains. Meds administered per MAR. Needs X2 assist to reposition in bed and x2 assist to transfer to chair. Foley Catheter care completed.Safety round checks completed. Bed alarm activated and audible. Call bell kept within reach. Joselyn Nicely, RN

## 2023-07-30 NOTE — Progress Notes
 [facility]	 [facility]	Internal Medicine Progress Note 6/1/2025Hospital Day: 8Attending Provider: Fleurette Humbles, MDSubjective: Yesterday: - Holding midodrine  due to elevated BP's- completed abx course, confirmed with ID pharm- now less willing to complete advance directive papers - daughter coming in 07/30/23- has f/up nephro appt (6/18)Overnight:- naeonAM:- Resting comfortably in bed. No pain. Objective: Vitals:Temp:  [97.5 ?F (36.4 ?C)-98.2 ?F (36.8 ?C)] 97.5 ?F (36.4 ?C)Pulse:  [73-80] 73Resp:  [17-20] 19BP: (103-160)/(49-81) 103/49SpO2:  [94 %-100 %] 95 %Device (Oxygen Therapy): room air  I/O's:Intake/Output Summary (Last 24 hours) at 07/30/2023 1358Last data filed at 07/30/2023 1134Gross per 24 hour Intake 440 ml Output 2750 ml Net -2310 ml  Physical Exam: GEN: resting in bed, no apparent acute distressHEENT:  NCAT, EOMICV: RRR, no murmursPULM: clear anterior lung fieldsGU: Chronic foley in place EXT: warm, no edema, strength 5/5 in all 4 extremities. (Gait not observed) Labs: No lab draw orderRecent Labs Lab 05/26/250923 05/27/250509 05/28/250613 WBC 9.0 7.7 6.4 HGB 7.1* 7.1* 7.1* HCT 23.50* 23.20* 23.40* PLT 227 259 276  Recent Labs Lab 05/26/250923 05/27/250509 05/28/250613 NEUTROPHILS 75.9* 65.7 64.1  Recent Labs Lab 05/26/250801 05/27/250509 05/28/250613 NA 137 138 139 K 4.9 4.9 5.0 CL 106 107 107 CO2 18* 18* 20 BUN 51* 49* 46* CREATININE 4.10* 4.10* 4.00* GLU 106* 114* 108* ANIONGAP 13 13 12   Recent Labs Lab 05/26/250801 05/27/250509 05/28/250613 CALCIUM  8.1* 8.3* 8.5* MG 1.9 2.2 2.3 PHOS 4.4 4.4 4.9*  Recent Labs Lab 05/26/250801 05/27/250509 05/28/250613 ALT 18 18 22  AST 36* 28 33 ALKPHOS 150* 143* 151* BILITOT 0.3 0.3 0.2 BILIDIR 0.2 0.1 0.1  No results for input(s): PTT, LABPROT, INR in the last 168 hours. Microbiology:No results for input(s): LABBLOO in the last 168 hours.Recent Labs Lab 05/25/251841 LABURIN No Growth No results for input(s): LOWERRESPIRA in the last 168 hours.No results found for this or any previous visit (from the past 24 hours).No results found for this or any previous visit (from the past 24 hours).Imaging: No results found.Assessment & Plan: Mr Gergen is a 89 yo male with PMHx of chronic foley 2/2 BPH, CVD (h/o AAA, CAD, HFpEF), orthostatic hypotension (home midodrine ),  presenting after recurrent falls, found to have sepsis 2/2 UTI s/p total 7 day course effective abx, now medically ready for discharge with STR accepted at [facility], but dispo pending family coordination with the facility. #Sepsis, resolved#Fever, leukocytosis, hypotension, resolved #Complicated UTIGrew proteus vulgaris (AmpC organism), susc to cipro , bactrim, resistant to macrobid . Currently continuing to be asymptomatic, afebrile, no leukocytosis. Pharmacy confirmed pip-tazo was effective abx regimen, meaning patient received total 7 day course of effective abx by 5/29. Vanc 5/24 Zosyn  5/23-5/25Augmentin 5/25Ciproflox 5/26 to 5/29- Foley exchanged in the ED #CKD#Chronic urinary retention requiring foley#Chronic hydroureteronephrosisFoley exchange in ED upon arrival 07/21/23. 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 Prior attempts at follow-up with nephrology unsuccessful. Will attempt follow-up upon dc with nephrology- BMP has remained stable at possible new baseline cr: 4.  - cont home sodium bicarb- encourage PO intake, IVF if not adequate- 6/18 appt with Dr. Glenda Landing- AM labs tomorrow #Fall #Orthostatic Hypotension Likely multifactorial given h/o orthostatic hypotension, UTI, elderly adult. No bruising fractures seen on imaging.  No current complaints of pain while on current regimen- Pain regimen: 	- Tylenol  1000mg  q8 hr PRN	- lidocaine  patch	- voltaren  scheduled	- PT/OT- plan for STR to regain strength, balance. Accepted at [facility].  - Held home midodrine  2.5mg  TID given BP wnl - Can consider holding on dc until visiting PCP  OPChronic: #CAD/AAA- cont home statin , DAPT- follow-up cardio to confirm need for DAPT#MDD- resumed home lexapro #GERD- at home on omeprazole  but concern for causing decreased efficacy of clopidogrel , plan to start pantoprazole on dc vs pepcid  on dc. #bowel reg: - miralax  BID, Senna BID#DIET:renal diet#PPX: plavix , heparin ,  pepcid , miralax #ACCESS: Foley, no PIV#DISPO: STR#CODE STATUS: Full CodeSignedGabriel Mia Adam, MDYPC Internal Medicine PGY-26/1/2025Case discussed with supervising Dr. Evon Hoguet. Final recommendations and attestation to follow.

## 2023-07-30 NOTE — Plan of Care
 Plan of Care Overview/ Patient StatusPt Mark [name], a 89 y.o. is AO x 3, confused to situation, forgetful, hard of hearing, hearing aids at the bedside, patient not reporting pain this shift, denies needing pain medication at this time, VSS on RA, Lungs diminished to auscultation.  Abd soft, +BS, Last BM 07/27/2023, bowel regime provided. Tolerating renal diet. Pills whole with water .  OOB with an assist x2, uses RW. Witnessed patient T & R self in bed at times, additional pillows provided and turning Q2H. Blanchable redness to gluteal cleft, barrier creams applied. No other complaints at this time.  Bed alarm set. Bed low and locked, call bell within reach. Safety maintained. Standard precautions maintained. Will CTM. See flowsheets for additional information.BP (!) 157/81  - Pulse 78  - Temp 97.6 ?F (36.4 ?C) (Oral)  - Resp 20  - Ht 5' 11 (1.803 m)  - Wt 82 kg  - SpO2 97%  - BMI 25.21 kg/m? Electronically Signed by Pepper Boyer, RN, July 30, 2023

## 2023-07-30 NOTE — Plan of Care
 Plan of Care Overview/ Patient StatusPatient is A&O/x3, is forgetful. Elevated SBP, asymptomatic. Denies of pains. Meds administered per MAR.Catheter in place with no obstruction. Catheter care completed.Patient has blanchable redness at his around his gluteal cleft and barrier creamed is applied after incontinent care.Patient is bedfast and requires x2 assist to reposition in bed.Safety round checks completed. Bed alarm activated and audible. Call bell placed within reach. Joselyn Nicely, RN

## 2023-07-31 LAB — CBC WITH AUTO DIFFERENTIAL
BKR WAM ABSOLUTE IMMATURE GRANULOCYTES.: 0.07 x 1000/ÂµL (ref 0.00–0.30)
BKR WAM ABSOLUTE LYMPHOCYTE COUNT.: 1.68 x 1000/ÂµL (ref 0.60–3.70)
BKR WAM ABSOLUTE NRBC: 0 x 1000/ÂµL (ref 0.00–1.00)
BKR WAM ANC (ABSOLUTE NEUTROPHIL COUNT): 4.25 x 1000/ÂµL (ref 2.00–7.60)
BKR WAM BASOPHIL ABSOLUTE COUNT.: 0.04 x 1000/ÂµL (ref 0.00–1.00)
BKR WAM BASOPHILS: 0.5 % (ref 0.0–1.4)
BKR WAM EOSINOPHIL ABSOLUTE COUNT.: 0.63 x 1000/ÂµL (ref 0.00–1.00)
BKR WAM EOSINOPHILS: 8.4 % — ABNORMAL HIGH (ref 0.0–5.0)
BKR WAM HEMATOCRIT: 24.9 % — ABNORMAL LOW (ref 38.50–50.00)
BKR WAM HEMOGLOBIN: 7.5 g/dL — ABNORMAL LOW (ref 13.2–17.1)
BKR WAM IMMATURE GRANULOCYTES: 0.9 % (ref 0.0–1.0)
BKR WAM LYMPHOCYTES: 22.5 % (ref 17.0–50.0)
BKR WAM MCH: 22.5 pg — ABNORMAL LOW (ref 27.0–33.0)
BKR WAM MCHC: 30.1 g/dL — ABNORMAL LOW (ref 31.0–36.0)
BKR WAM MCV: 74.6 fL — ABNORMAL LOW (ref 80.0–100.0)
BKR WAM MONOCYTE ABSOLUTE COUNT.: 0.81 x 1000/ÂµL (ref 0.00–1.00)
BKR WAM MONOCYTES: 10.8 % (ref 4.0–12.0)
BKR WAM MPV: 9.2 fL (ref 8.0–12.0)
BKR WAM NEUTROPHILS: 56.9 % (ref 39.0–72.0)
BKR WAM NUCLEATED RED BLOOD CELLS: 0 % (ref 0.0–1.0)
BKR WAM PLATELETS: 315 x1000/ÂµL (ref 150–420)
BKR WAM RDW-CV: 18.4 % — ABNORMAL HIGH (ref 11.0–15.0)
BKR WAM RED BLOOD CELL COUNT.: 3.34 M/ÂµL — ABNORMAL LOW (ref 4.00–6.00)
BKR WAM WHITE BLOOD CELL COUNT: 7.5 x1000/ÂµL (ref 4.0–11.0)

## 2023-07-31 LAB — BASIC METABOLIC PANEL
BKR ANION GAP: 11 (ref 7–17)
BKR BLOOD UREA NITROGEN: 44 mg/dL — ABNORMAL HIGH (ref 8–23)
BKR BUN / CREAT RATIO: 12.9 (ref 8.0–23.0)
BKR CALCIUM: 8.4 mg/dL — ABNORMAL LOW (ref 8.8–10.2)
BKR CHLORIDE: 106 mmol/L (ref 98–107)
BKR CO2: 20 mmol/L (ref 20–30)
BKR CREATININE DELTA: -0.6
BKR CREATININE: 3.4 mg/dL — ABNORMAL HIGH (ref 0.40–1.30)
BKR EGFR, CREATININE (CKD-EPI 2021): 16 mL/min/{1.73_m2} — ABNORMAL LOW (ref >=60)
BKR GLUCOSE: 93 mg/dL (ref 70–100)
BKR POTASSIUM: 5.1 mmol/L (ref 3.3–5.3)
BKR SODIUM: 137 mmol/L (ref 136–144)

## 2023-07-31 LAB — MAGNESIUM: BKR MAGNESIUM: 2 mg/dL (ref 1.7–2.4)

## 2023-07-31 LAB — PHOSPHORUS     (BH GH L LMW YH): BKR PHOSPHORUS: 4.1 mg/dL (ref 2.2–4.5)

## 2023-07-31 NOTE — Plan of Care
 Plan of Care Overview/ Patient StatusAssumed care of pt @ 0700 Neuro: A&Ox4. HOH. Generalized tremor at baselineCardiac: VSS, Denies SOB and chest pain. Hgb 7.5 g/dL, goal is > 7 g/dL per MD. Resp: RA, respirations even and unlaboredGI/GU: Abdomen non tender, rounded, +BS, pills whole with water , tolerating regular dietFoley catheter in place draining yellow urine, foley care provided, LBM 5/29.MSK: Pt is Ax2 OOB, turn and pivot to the chair with weak and unsteady gait. Turn & reposition assistance provided Q2 hours. Skin: Blanchable redness to bottomPain: denies pain Safety maintained, call bell and belongings in reach, bed low and locked, hourly rounding and POC ongoing.Problem: Adult Inpatient Plan of CareGoal: Plan of Care ReviewOutcome: Interventions implemented as appropriateGoal: Patient-Specific Goal (Individualized)Outcome: Interventions implemented as appropriateGoal: Absence of Hospital-Acquired Illness or InjuryOutcome: Interventions implemented as appropriateGoal: Optimal Comfort and WellbeingOutcome: Interventions implemented as appropriateGoal: Readiness for Transition of CareOutcome: Interventions implemented as appropriate Problem: WoundGoal: Optimal CopingOutcome: Interventions implemented as appropriateGoal: Optimal Functional AbilityOutcome: Interventions implemented as appropriateGoal: Absence of Infection Signs and SymptomsOutcome: Interventions implemented as appropriateGoal: Improved Oral IntakeOutcome: Interventions implemented as appropriateGoal: Optimal Pain Control and FunctionOutcome: Interventions implemented as appropriateGoal: Skin Health and IntegrityOutcome: Interventions implemented as appropriateGoal: Optimal Wound HealingOutcome: Interventions implemented as appropriate Problem: Skin Injury Risk IncreasedGoal: Skin Health and IntegrityOutcome: Interventions implemented as appropriate Problem: Fall Injury RiskGoal: Absence of Fall and Fall-Related InjuryOutcome: Interventions implemented as appropriate Problem: InfectionGoal: Absence of Infection Signs and SymptomsOutcome: Interventions implemented as appropriate Problem: Physical Therapy GoalsGoal: Physical Therapy GoalsDescription: PT GOALS1. Patient will perform bed mobility with minimal assist x 22. Patient will perform transfers with minimal assist x 2 using appropriate assistive device 3. Patient will ambulate a minimum of 15 feet with minimal assist x 2 using appropriate assistive device Outcome: Interventions implemented as appropriate Problem: Violence Risk or ActualGoal: Anger and Impulse ControlOutcome: Interventions implemented as appropriate Problem: Occupational Therapy GoalsGoal: Occupational Therapy GoalsDescription: OT GOALSPt will ambulate bed<>bathroom w/ min A & approp assistive devicePt will perform all aspects of toilet w/ min APt will improve standing balance to FAIR+ in preparation for sinkside grooming tasksPt will perform LB dressing & bathing w/ min A & AE PRNPt will improve BUE MMT by 1/2 grade in preparation for ADLsOutcome: Interventions implemented as appropriate

## 2023-07-31 NOTE — Progress Notes
 [facility]            Mark [name], Mark when he can leave the hospital- denies abdominal painObjective  Objective: Vitals:Temp:  [97.4 ?F (36.3 ?C)-97.9 ?F (36.6 ?C)] 97.4 ?F (36.3 ?C)Pulse:  [71-78] 71Resp:  [17-20] 17BP: (103-157)/(49-81) 142/53SpO2:  [95 %-98 %] 98 %Device (Oxygen Therapy): room airWeight: Wt: 07/22/23 82 kg 06/10/23 81.6 kg 06/07/23 84.2 kg  I/O's:Intake/Output Summary (Last 24 hours) at 07/31/2023 0647Last data filed at 07/31/2023 0610Gross per 24 hour Intake 840 ml Output 2800 ml Net -1960 ml  Physical Exam:BP (!) 142/53  - Pulse 71  - Temp 97.4 ?F (36.3 ?C) (Axillary)  - Resp 17  - Ht 5' 11 (1.803 m)  - Wt 82 kg  - SpO2 98%  - BMI 25.21 kg/m? General: pleasant, alert and oriented, in no acute distress HEENT: normocephalic, MMM, EOMI, neck ROM normalCV: RRR, normal S1/S2, 3/6 systolic ejection murmurResp: normal work of breathingGI: soft, not distended, no tenderness to palpationNeuro: A&O x3  Assessment & Plan: Mark [name] is a 89 y.o. 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 Currently continuing to be asymptomatic, afebrile, no leukocytosis while on cipro . No other overt sources of infection including clear [place] chest. Blood pressure stable s/p IVF in recent days. Antibiotic course: Vanc 5/24; Zosyn  5/23-5/25; Augmentin  5/25; Ciproflox 5/26 - present- avoiding bactrim given AKI on CKD- pharmacy confirmed pip-tazo was effective abx regimen, meaning patient received total 7 day course of effective abx by 5/29. - completed cipro  7 day course (D1: 5/26-6/1) # Fall # orthostatic hypotensionLikely multifactorial given h/o orthostatic hypotension, found UTI, elderly adult. No bruising fractures seen on imaging- [name] regimen:                - Tylenol  1000mg  q8 hr                - lidocaine  patch               - voltaren                - PT/OT- orthostatic vitals- held home midodrine  2.5mg  TID given BP wnl. Can consider holding on dc until visiting PCP OP- plan for STR to regain strength, balance. Accepted at [facility] #CKD#Chronic urinary retention requiring foley#chronic hydroureteronephrosisFoley exchange in ED upon arrival. 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 Prior attempts at follow-up with nephrology unsuccessful. Will attempt follow-up upon dc with nephrology- BMP has remained stable at possible new baseline cr: 4.  - cont home sodium bicarb- encourage PO intake, IVF if not adequateChronic: #CAD/AAA- cont home statin, DAPT- follow-up cardio to confirm need for DAPT #MDD- resumed home lexapro  #GERD- at home on omeprazole  but concern for causing decreased efficacy of clopidogrel , plan to start pantoprazole instead.  #bowel reg: - miralax  BID, Senna BID #DIET:renal diet#PPX:plavix , heparin ,  pepcid , miralax #ACCESS:Foley, no PIV#DISPO:pending clinical course#CODE STATUS:Full CodeBOWEL REG: (Last BM Last Bowel Movement: 07/27/23) Discussed and seen with attending. Final plan  pending attestation by attending provider: Pat Bonier, MD Signed:Michelle Janeen Meckel, MDPGY-1, [facility], Primary careContact via mobile heart beat6/04/2023

## 2023-07-31 NOTE — Plan of Care
 Plan of Care Overview/ Patient StatusPatient is A&OX3, is forgetful, able to communicate his needs. VSS. Denies of pains. Administered meds per MAR. Refuses topical analgesic cream and Miralax , however, administered Senna. Also refused Heparin  shot in the night. MD made aware.  Administered next scheduled heparin  shot in the AM. Incontinent and catheter care completed. Barrier cream applied at patients sacral region. Positional changes done q2h. Requires x2 assist to reposition in bed and needs x2 assist OOB to chair. Safety measures reinforced. Safety rounds completed. Bed alarm activated. Call bell kept within reach. Joselyn Nicely, RN

## 2023-07-31 NOTE — Plan of Care
 Plan of Care Overview/ Patient StatusProblem: Adult Inpatient Plan of CareGoal: Readiness for Transition of CareOutcome: Interventions implemented as appropriatePer TCR, pt is MR needs STR; DC needs list tasked to confirm SNF choice w/ pt/family then AUTH will be needed.PASSR approved.Marcellina Sermon, BSN, RNCare Coordinator Case Management Department(O) [phone]

## 2023-07-31 NOTE — Plan of Care
 Mark [name]					Location: SLA3/L367-89 y.o., male				Attending: Pat Bonier*	Admit Date: 07/21/2023			[id number] LOS: 9 days Initial Nutrition AssessmentNutrition recommendations:Liberalize diet to regular given ageRecommend Ensure plus daily to supplement kcal intake (each 8 oz ons provides 350 kcal, 13 g protein)Encourage adequate kcal and fluid intake on the floor as you areHPI: As per MD note 07/30/23 - 89 yo male with PMHx of chronic foley 2/2 BPH, CVD (h/o AAA, CAD, HFpEF), orthostatic hypotension (home midodrine ),  presenting after recurrent falls, found to have sepsis 2/2 UTI s/p total 7 day course effective abx, now medically ready for discharge with STR accepted at [facility], but dispo pending family coordination with the facility.Subjective: Initial nutrition assessment completed following organization's policies for LOS >10-12 days (D10). Per MD, pt medically ready for discharge with STR accepted at [facility], but dispo pending family coordination with the facility. Pt noted with significant weight changes per chart review (see below for details). Stage 1 PI noted to coccyx as per flowsheet 07/31/23. PO intake 100% x multiple meals per flowsheet over the past 7 days. Pt appearing to be tolerating current diet well and meeting >75% EEN via PO intake. Recommend liberalization of diet to regular given age and lytes WNL. Recommend ensure plus daily to support with meeting kcal and protein needs in the setting of significant weight loss. Current Diet Order: Diet RegularNutrition Supplements  (2gm NA, 2gm K, 1000 mg Phos)Current Intake: PO intake 100% x multiple meals per flowsheet over the past 7 days. I/Os: LBM 07/27/23 as per stool flowsheet. Monitored and managed per MD, bowel regimen noted per [facility]. Intake/Output Summary (Last 24 hours) at 07/31/2023 1200Last data filed at 07/31/2023 0954Gross per 24 hour Intake 840 ml Output 2750 ml Net -1910 ml  Diet History: Start   Ordered 07/22/23 0840  Diet Renal Non Dialysis  DIET EFFECTIVE NOW       07/22/23 6578 07/22/23 0332  Diet Regular  DIET EFFECTIVE NOW,   Status:  Canceled       07/22/23 4696 Food Allergies: Allergies Allergen Reactions  Ace Inhibitors Cough Cultural/Religious/Ethnic Needs: none known. Skin: Braden score = 16 as per flowsheet. Stage 1 PI noted to coccyx as per flowsheet 07/31/23. Medications: plavix , lexapro , pepcid , heparin , gabapentin , proamatine  held, miralax , rosuvastatin , senna, sodium bicarbonateLabs: Recent Labs   05/25/250602 05/26/250801 05/27/250509 05/28/250613 06/02/250507 NA 138 137 138 139 137 K 4.8 4.9 4.9 5.0 5.1 PHOS 3.8 4.4 4.4 4.9* 4.1 MG 2.0 1.9 2.2 2.3 2.0 CREATININE 4.30* 4.10* 4.10* 4.00* 3.40* BUN 54* 51* 49* 46* 44* BG 07/31/23 - 93A1c= 6.4 10/20/24Anthropometrics:- Height: 5' 11 (1.803 m)- Current weight: 82 kg- BMI: Body mass index is 25.21 kg/m?Mark AasWeight History: -8% x 4 months, clinically significant-5% x 6 months, not clinically significant-15.9% x 1 year, not clinically significant Wt Readings from Last 20 Encounters: 07/22/23 82 kg 06/10/23 81.6 kg 06/07/23 84.2 kg 05/29/23 82.7 kg 05/12/23 84 kg 03/22/23 89.2 kg 03/17/23 89.8 kg 03/14/23 88.1 kg 03/13/23 88.3 kg 02/24/23 86.4 kg 11/17/22 96.6 kg 10/19/22 96.2 kg 10/14/22 96.2 kg 10/06/22 98.9 kg 09/15/22 96.5 kg 07/11/22 97.5 kg 04/22/22 96.2 kg 04/20/22 96.2 kg 04/07/22 96.2 kg 01/07/22 96.2 kg Physical findings:- age related losses noted - Edema: no edema noted per flowsheet as of 07/31/23. ESTIMATED NUTRITION REQUIREMENTS:Kcal/day: 2040 kcal               (25 kcal/kg)Protein/day: 102-122 grams  (1.25-1.5  gm/kg) * monitor renal functionFluids/day: Fluid management per medical team discretion. 2040 ml (50mL/kcal) Needs based on: Admission wt of  82 kg. Age, BMI, wt loss, AKI on CKD 4, skin integrity considered. Nutrition Diagnosis:	Nutrition Diagnosis/Problem: Unintended weight loss	Related to: medical conditions; advanced age	As Evidenced by: -8% x 4 months, clinically significantComments: newInterventions:Nutrition Prescription: regular diet; ensure plus dailyPlan: Meal and Snacks: General/Healthful dietMedical Food Supplements: Commercial beverage (ensure plus)Feeding Assistance:  (encouragement)Nutrition-related medication management: Medications (per MD)Coordination of other care during Nutrition care: Collaboration/referral to other providersD/C & transfer of care to new setting or provider: Collaborative referal to other providers (D/c plan pending at this time. RD to follow as able while inpatient) Goals: Pt to meet >75% estimated energy needs by next nutritional f/u Pt to consume the equivalent to one or more nutritional supplement dailyMonitoring/Evaluation:Food/Nutrition-Related Outcomes:            food and nutrient intake/administration.Anthropometric Outcomes:            Height, weight, BMI, Weight historyBiochemical Data, Medical Tests, and Procedure Outcomes:            Labs (glucose, BMP)Nutrition-Focused Physical Finding Outcomes:            Physical appearance, muscle and fat wasting, swallow function, appetite, skin integrity, GI function.RDN following per  [facility] Clinical Nutrition Standards of Care.Registered Dietitian Nutritionist: Ritta Chessman, MS, RD, CDNPlease reach out via MHB at [phone] if you have any questions. **Please note: Nutrition is a consult only service on weekends and holidays. Please enter a consult in EPIC if assistance is needed on a weekend or holiday or reach out to [phone] to contact the covering RD.

## 2023-08-01 MED ORDER — BISACODYL 10 MG RECTAL SUPPOSITORY
10 mg | Freq: Two times a day (BID) | RECTAL | Status: DC | PRN
Start: 2023-08-01 — End: 2023-08-04

## 2023-08-01 MED ORDER — BISACODYL 10 MG RECTAL SUPPOSITORY
10 | Freq: Once | RECTAL | Status: CP
Start: 2023-08-01 — End: ?
  Administered 2023-08-01: 12:00:00 10 mg via RECTAL

## 2023-08-01 MED ORDER — POLYETHYLENE GLYCOL 3350 17 GRAM ORAL POWDER PACKET
17 gram | Freq: Three times a day (TID) | ORAL | Status: DC
Start: 2023-08-01 — End: 2023-08-04
  Administered 2023-08-01 – 2023-08-03 (×5): 17 gram via ORAL

## 2023-08-01 NOTE — Plan of Care
 Inpatient Physical Therapy Progress Note IP Adult PT Eval/Treat - 08/01/23 1106    Date of Visit / Treatment  Date of Visit / Treatment 08/01/23   Note Type Progress Note   Start Time 1041   End Time 1106   Total Treatment Time 25    General Information  Subjective agreeable   General Observations seated in recliner, RA, cleared by RN   Precautions/Limitations Fall Precautions;Bed alarm;Chair alarm   Precautions/Limitations Comment HOH    Weight Bearing Status  Weight Bearing Status WNL - Within normal limits    Vital Signs and Orthostatic Vital Signs  Vital Signs Free text RA, NAD    Pain/Comfort  Pain Comment (Pre/Post Treatment Pain) no c/o at start of session    Cognition  Level of Consciousness alert   Following Commands Follows one step commands with increased time;Follows one step commands with repetition    Skin Assessment  Skin Assessment See Nursing Documentation    Balance  Sitting Balance: Static  FAIR+     Maintains static position without assist or device, may require Supervision or Verbal Cues (>2 minutes)   Sitting Balance: Dynamic  FAIR      Performs dynamic activities through 75% range Contact Guard or partial range (50-75%) with Supervision   Standing Balance: Static FAIR-      Contact Guard to maintain static position with no Assistive Device   Standing Balance: Dynamic  POOR+   Moves through 1/2 range with minimal assist to right self   Balance Assist Device Rolling walker    Bed Mobility  Bed Mobility Comments OOB    Sit-Stand Transfer Training  Sit-to-Stand Transfer Independence/Assistance Level Minimum assist;Assist of 1   Sit-to-Stand Transfer Assist Device Rolling walker   Stand-to-Sit Transfer Independence/Assistance Level Minimum assist;Assist of 1   Stand-to-Sit Transfer Assist Device Rolling walker   Sit-Stand Transfer Comments max cues for hand placement   Stand-Sit Transfer Comments decr eccentric control    Gait Training  Independence/Assistance Level  Minimum assist;Assist of 1 person + 1 person to manage equipment   Assistive Device  Rolling walker   Gait Distance 25 feet;x2   Gait Training Comments req seated rest break, +tremulous when fatigued, narrow BOS, reports incr B foot pain with ambulation    Handoff Documentation  Handoff Patient instructed to call nursing for mobility;Discussed with nursing;Patient in chair;Chair alarm    PT- AM-PAC - Basic Mobility Screen- How much help from another person do you currently need.....  Turning from your back to your side while in a a flat bed without using rails? 3 - A Little - Requires a little help (supervision, minimal assistance). Can use assistive devices.   Moving from lying on your back to sitting on the side of a flat bed without using bed rails? 3 - A Little - Requires a little help (supervision, minimal assistance). Can use assistive devices.   Moving to and from a bed to a chair (including a wheelchair)? 3 - A Little - Requires a little help (supervision, minimal assistance). Can use assistive devices.   Standing up from a chair using your arms(e.g., wheelchair or bedside chair)? 3 - A Little - Requires a little help (supervision, minimal assistance). Can use assistive devices.   To walk in a hospital room? 2 - A Lot - Requires a lot of help (maximum to moderate assistance). Can use assistive devices.   Climbing 3-5 steps with a railing? 1 - Total - Requires total assistance or cannot do  it at all.   AMPAC Mobility Score 15   TARGET Highest Level of Mobility Mobility Level 4, Transfer to chair    Clinical Impression  Follow up Assessment Pt tolerates therapy fairly. Pt is limited by decr strength, impaired balance, and decr activity tolerance. Rec d/c with mod complexity support when ready   Criteria for Skilled Therapeutic Interventions Met yes;treatment indicated   Rehab Potential good, to achieve stated therapy goals    Patient/Family Stated Goals  Patient/Family Stated Goal(s) feel better    Frequency/Equipment Recommendations  PT Frequency 3x per week   Next Treatment Expected 08/03/23   PT/PTA completing this assessment Syd    PT Recommendations for Inpatient Admission  Activity/Level of Assist ambulate;assist of 1;with rolling walker;in room    Planned Treatment / Interventions  Education Treatment / Interventions Patient Education / Training   Planned Treatment/Interventions Comments pt role, poc, safety, mobility    PT Discharge Summary  Physical Therapy Disposition Recommendation Moderate complexity support and therapy to progress functional mobility/ ADLs/ IADLs recommended for post- acute care.  See assessment for additional details.   Equipment Recommendations for Discharge To be determined pending progress     Charlynn Coombe, DPT

## 2023-08-01 NOTE — Plan of Care
 Plan of Care Overview/ Patient Status1500-1900Pt is A&Ox2. Disoriented to place and situation. Pt on RA. Lungs diminished. Pt on regular diet. Takes pills whole with water .Pt OOB Ax1 to chair/commode. Call bell in reach, bed alarm on, bed lowBP (!) 120/58  - Pulse 77  - Temp 97.4 ?F (36.3 ?C) (Oral)  - Resp 18  - Ht 5' 11 (1.803 m)  - Wt 82 kg  - SpO2 96%  - BMI 25.21 kg/m?

## 2023-08-01 NOTE — Plan of Care
 Problem: Adult Inpatient Plan of CareGoal: Readiness for Transition of CareOutcome: Interventions implemented as appropriate Plan of Care Overview/ Patient StatusPer TCR's rounds - patient is medically ready for discharge. Needing SNF Bed then AUTH - CM spoke to daughter regarding SNF choice- Family declined [facility] and one other accepting facility and per daughter Amalia Badder)  would like CM Dept to re-refer to [facility] (previously declined due to no available male bed) and [facility] (Considering).  Referrals sent to [facility] and [facility]. Awaiting response.   Family aware patient is MR and choice is needed today (08/01/23) - then AUTH will be sent via Insurance Dept.Yancey Helena, MSN, RN, [facility] ManagerYNHH Case Management Department

## 2023-08-01 NOTE — Plan of Care
 Inpatient Occupational Therapy Progress NoteDefault Flowsheet Data (most recent)   IP Adult OT Eval/Treat - 08/01/23 1151    Date of Visit / Treatment  Date of Visit / Treatment 08/01/23   Note Type Daily Note   Progress Report Due 08/10/23   Start Time 1126   End Time 1151   Total Treatment Time 25    General Information  Subjective I need my shoes to walk    General Observations Pt received sitting in recliner sleeping but easily arousable, foley, RN cleared tx, agreeable   Precautions/Limitations Fall Precautions;Bed alarm;Chair alarm   Precautions/Limitations Comment HOH, has hearing aids at bedside but reports they need to be connected to phone to donn    Weight Bearing Status  Weight Bearing Status WNL - Within normal limits    Vital Signs and Orthostatic Vital Signs  Vital Signs Free text RA, NAD    Pain/Comfort  Pain Comment (Pre/Post Treatment Pain) no reports of pain noted    Cognition  Level of Consciousness alert   Following Commands Follows one step commands with increased time;Follows one step commands with repetition   mainly due to [facility] / Judgment Fall risk;Decreased recognition / insight of own deficits    Vision/ Hearing  Hearing difficulties / Use of hearing aids [facility]    Skin Assessment  Skin Assessment See Nursing Documentation    Balance  Sitting Balance: Static  FAIR       Maintains static position without assist or device, may require Supervision or Verbal Cues (<2 minutes)   Sitting Balance: Dynamic  FAIR-     Performs dynamic activities through partial range (50-75%) with Contact Guard   Standing Balance: Static FAIR-      Contact Guard to maintain static position with no Assistive Device   Standing Balance: Dynamic  POOR+   Moves through 1/2 range with minimal assist to right self   Materials engineer Comment Ax1     AM-PAC - Daily Activity IP Short Form  Help needed from another person putting on/taking off regular lower body clothing 2 - A Lot   Help needed from another person for bathing (incl. washing, rinsing, drying) 2 - A Lot   Help needed from another person for toileting (incl. using toilet, bedpan, urinal) 2 - A Lot   Help needed from another person putting on/taking off regular upper body clothing 3 - A Little   Help needed from another person taking care of personal grooming such as brushing teeth 2 - A Lot   Help needed from another person eating meals 4 - None   AM-PAC Daily Activity Raw Score (Total of rows above) 15   CMS Score (based on Raw Score - with G Code) 15 - 56.46% impaired      (G Code - CK)    Bed Mobility  Bed Mobility Comments received and remained OOB    Sit-Stand Transfer Training  Sit-to-Stand Transfer Independence/Assistance Level Minimum assist   Sit-to-Stand Transfer Assist Device Rolling walker   Stand-to-Sit Transfer Independence/Assistance Level Minimum assist   Stand-to-Sit Transfer Assist Device Rolling walker   Transfer Safety Analysis Concerns cues for hand placement   Transfer Safety Analysis Impairments impaired balance;decreased strength;decreased endurance/activity tolerance   Occupation-Based Indications: Sit to Stand Transfers To simulate the use of grab bars in bathroom;To simulate toilet transfers   Limitations to Sit to Stand Transfers for Occupation-Based Tasks Balance deficits;Impaired activity tolerance  Sit-Stand Transfer Comments requires cues for hand placement    Functional Ambulation  Independence/Assistance Level  Minimum assist;Moderate assist;Assist of 1 person + 1 person to manage equipment   Assistive Device  Rolling walker   Gait Distance 15 feet;x2   Gait Analysis Deviations standing too far/close   Gait Analysis Impairments impaired balance;decreased strength;decreased endurance/activity tolerance   Occupation-based Indications:  Functional Ambulation To improve activity tolerance for daily routines;To improve balance and increase safety with daily routines   Limitations to functional ambulation for occupation-based tasks Balance deficits   Functional Ambulation Comments sl buckle with fatigue, benefits from chair follow, required seated rest break    Handoff Documentation  Handoff Patient in chair;Chair alarm;Patient instructed to call nursing for mobility;Discussed with nursing;Pressure relief cushion    Therapeutic Functional Activity  Interventions to support occupations Engaged in functional transfers in preparation for ADL tasks;Engaged in balance tasks to improve ability to complete ADL tasks;Challenged activity tolerance to increase independence with ADLs    Clinical Impression  Follow up Assessment Pt tol OT tx well, limited by impaired balance, dec strength, dec act tol, dec flexibility, impaired cognition. Requires assist x1+1 for ADLs and func mob at RW level. Cont to rec moderate complexity support when medically ready.   Rehab Potential good, to achieve stated therapy goals    Patient/Family Stated Goals  Patient/Family Stated Goal(s) decrease pain    Frequency/Equipment Recommendations  OT Frequency 3x per week   Next Treatment Expected 08/03/23   OT/OTA completing this assessment LG    OT Recommendations for Inpatient Admission  ADL Recommendations assist of 1;assist to bathroom;with rolling walker    Education - Learning Assessment  Education Topic Functional mobility techniques;ADL techniques;Safety;Therapy role/rehab process;Energy conservation   Learners Patient   Readiness Acceptance   Method Explanation   Response Needs Reinforcement    OT Discharge Summary  Disposition Recommendation Moderate complexity support and therapy to progress functional mobility/ ADLs/ IADLs recommended for post- acute care.  See assessment for additional details. Equipment Recommendations for Discharge To be determined pending progress     [name], [name] of Care Overview/ Patient Status

## 2023-08-01 NOTE — Plan of Care
 Plan of Care Overview/ Patient StatusPt is A&O x3, disoriented to situation, but pleasant and cooperative. Pt is HOH, pt has 1 hearing aid @ bedside. VSS on RA, no reports of pain this shift. Chronic foley in place, draining yellow urine. Pt can be incontinent of stool, bedpan in use, LBM 5/29, bowel regimen continued. Pt I Ax2 s/p chair, witnessed pt reposition self in bed, weight shift assistance provided as needed. Bed alarm on, meds given per [facility], call bell within reach, bed in the lowest position, safety maintained, and will CTM.BP 135/66  - Pulse 77  - Temp 98.3 ?F (36.8 ?C) (Oral)  - Resp 19  - Ht 5' 11 (1.803 m)  - Wt 82 kg  - SpO2 95%  - BMI 25.21 kg/m? Revonda Castles, RN

## 2023-08-01 NOTE — Plan of Care
 Problem: Adult Inpatient Plan of CareGoal: Plan of Care ReviewOutcome: Interventions implemented as appropriateGoal: Patient-Specific Goal (Individualized)Outcome: Interventions implemented as appropriateGoal: Absence of Hospital-Acquired Illness or InjuryOutcome: Interventions implemented as appropriateGoal: Optimal Comfort and WellbeingOutcome: Interventions implemented as appropriateGoal: Readiness for Transition of CareOutcome: Interventions implemented as appropriate Problem: WoundGoal: Optimal CopingOutcome: Interventions implemented as appropriateGoal: Optimal Functional AbilityOutcome: Interventions implemented as appropriateGoal: Absence of Infection Signs and SymptomsOutcome: Interventions implemented as appropriateGoal: Improved Oral IntakeOutcome: Interventions implemented as appropriateGoal: Optimal Pain Control and FunctionOutcome: Interventions implemented as appropriateGoal: Skin Health and IntegrityOutcome: Interventions implemented as appropriateGoal: Optimal Wound HealingOutcome: Interventions implemented as appropriate Problem: Skin Injury Risk IncreasedGoal: Skin Health and IntegrityOutcome: Interventions implemented as appropriate Problem: Fall Injury RiskGoal: Absence of Fall and Fall-Related InjuryOutcome: Interventions implemented as appropriate Problem: InfectionGoal: Absence of Infection Signs and SymptomsOutcome: Interventions implemented as appropriate Problem: Physical Therapy GoalsGoal: Physical Therapy GoalsDescription: PT GOALS1. Patient will perform bed mobility with minimal assist x 22. Patient will perform transfers with minimal assist x 2 using appropriate assistive device 3. Patient will ambulate a minimum of 15 feet with minimal assist x 2 using appropriate assistive device Outcome: Interventions implemented as appropriate Problem: Violence Risk or ActualGoal: Anger and Impulse ControlOutcome: Interventions implemented as appropriate Problem: Occupational Therapy GoalsGoal: Occupational Therapy GoalsDescription: OT GOALSPt will ambulate bed<>bathroom w/ min A & approp assistive devicePt will perform all aspects of toilet w/ min APt will improve standing balance to FAIR+ in preparation for sinkside grooming tasksPt will perform LB dressing & bathing w/ min A & AE PRNPt will improve BUE MMT by 1/2 grade in preparation for ADLsOutcome: Interventions implemented as appropriate Plan of Care Overview/ Patient StatusPt alert and oriented x3, disoriented to situation. HOH. Assist x2 OOB. VSS on RA. Chronic foley in place. Last BM 5/29. Bowel regimen per MAR. Safety maintained, call bell within reach, bed/chair alarm in use and audible. Pt encouraged to express feelings/ concerns to staff. Addendum: suppository given per mar order. BM x1 this shift.

## 2023-08-01 NOTE — Progress Notes
 [facility] [address]            [place] [facility] Progress NoteAttending Provider: Rufino Coulter Mawulawoe*Hospital Day # 10Subjective: Yesterday: - Orthostats w/ DBP changes+- No med changes- HOLDING midodrine  still due to elevated BP's- Awaiting placement Overnight:-NAEOToday:- feeling fine, wondering when he can leave the hospital- denies abdominal painObjective  Objective: Vitals:Temp:  [97.5 ?F (36.4 ?C)-98.3 ?F (36.8 ?C)] 98.3 ?F (36.8 ?C)Pulse:  [75-81] 81Resp:  [17-20] 17BP: (128-152)/(66-75) 152/75SpO2:  [95 %-99 %] 99 %Device (Oxygen Therapy): room airWeight: Wt: 07/22/23 82 kg 06/10/23 81.6 kg 06/07/23 84.2 kg  I/O's:Intake/Output Summary (Last 24 hours) at 08/01/2023 0707Last data filed at 08/01/2023 0530Gross per 24 hour Intake 714 ml Output 2250 ml Net -1536 ml  Physical Exam:BP (!) 152/75  - Pulse 81  - Temp 98.3 ?F (36.8 ?C) (Oral)  - Resp 17  - Ht 5' 11 (1.803 m)  - Wt 82 kg  - SpO2 99%  - BMI 25.21 kg/m? General: pleasant, alert and oriented, in no acute distress HEENT: normocephalic, atraumatic, sclera anictericResp: normal work of breathing on RAGI: Soft NTND no RGNeuro: A&O x3  Assessment & Plan: Mark [name] is a 88 y.o. man 89 yo male with PMH chronic foley 2/2 BPH, CVD (h/o AAA, CAD, HFpEF), orthostatic hypotension (home midodrine , held in hosp),  presenting after recurrent falls, found to have sepsis 2/2 UTI s/p 2 day course zosyn , now on ciproflox given susc grid, with STR accepted at [facility], but dispo pending outstanding fees at facility, now awaiting STR.  #Sepsis, resolving#fever, leukocytosis, hypotension, resolving#Complicated 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 Currently continuing to be asymptomatic, afebrile, no leukocytosis while on cipro . No other overt sources of infection including clear [place] chest. Blood pressure stable s/p IVF in recent days. Antibiotic course: Vanc 5/24; Zosyn  5/23-5/25; Augmentin  5/25; Ciproflox 5/26 - present- avoiding bactrim given AKI on CKD- pharmacy confirmed pip-tazo was effective abx regimen, meaning patient received total 7 day course of effective abx by 5/29. - completed cipro  7 day course (D1: 5/26-6/1) # Fall # orthostatic hypotensionLikely multifactorial given h/o orthostatic hypotension, found UTI, elderly adult. No bruising fractures seen on imaging- Pain regimen:                - Tylenol  1000mg  q8 hr                - lidocaine  patch               - voltaren                - PT/OT- orthostatic vitals- held home midodrine  2.5mg  TID given BP wnl. Can consider holding on dc until visiting PCP OP- plan for STR to regain strength, balance. Accepted at [facility] #CKD#Chronic urinary retention requiring foley#chronic hydroureteronephrosisFoley exchange in ED upon arrival. 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 Prior attempts at follow-up with nephrology unsuccessful. Will attempt follow-up upon dc with nephrology- BMP has remained stable at possible new baseline cr: 4.  - cont home sodium bicarb- encourage PO intake, IVF if not adequateChronic: #CAD/AAA- cont home statin, DAPT- follow-up cardio to confirm need for DAPT #MDD- resumed home lexapro  #GERD- at home on omeprazole  but concern for causing decreased efficacy of clopidogrel , plan to start pantoprazole instead.  #bowel reg: - miralax  BID, Senna BID #DIET:renal diet#PPX:plavix , heparin ,  pepcid , miralax #ACCESS:Foley, no PIV#DISPO:pending clinical course#CODE STATUS:Full CodeBOWEL REG: (Last  BM Last Bowel Movement: 07/27/23) Discussed and seen with attending. Final plan pending attestation by attending provider: Pat Bonier, MD Signed:[name] MDPGY-3, [facility], Primary careContact via mobile heart beat6/04/2023

## 2023-08-02 NOTE — Progress Notes
 Progress NoteRFA: fall, UTILOS: 11 IE: no acute eventsSubjective: no complaintsVitals: within normal limits, SBPs to 140sExam: appears comfortable, stable from prior, disoriented to time, watching TV, smilesAssessment: 89yo w/ dementia, treated Proteus Vulgaris UTI awaiting placementEncourage out of bedRuchit Erich Haus, MD, MPHInternal Medicine and Pediatrics, PGY-4[facility] HospitalContact on MHBFor further details see attending addendum

## 2023-08-02 NOTE — Plan of Care
 Problem: Adult Inpatient Plan of CareGoal: Readiness for Transition of CareOutcome: Interventions implemented as appropriate Plan of Care Overview/ Patient StatusAddendum 1330 - CM phone call to daughter (Mark [name] - [phone] [facility]) Message left regarding family choice for SNF - [name] has offered a bed. Patient is medically ready for discharge. CM Dept will continue to follow for post acute needs.Pt medically ready for discharge, CM phone call to daughter - Daughter will contact Mark [name] and [address] [facility]) - as those are family's 1st choice. CM Dept to follow--up with daughter. Daughter/Family aware of need to select SNF today (08/02/23) and to proceed with Auth.Mark Helena, MSN, RN, [facility] ManagerYNHH Case Management Department

## 2023-08-02 NOTE — Plan of Care
 Plan of Care Overview/ Patient StatusPt is A&O x3, disoriented to situation, but pleasant and cooperative. Pt is HOH, pt has 1 hearing aid @ bedside. VSS on RA, no reports of pain this shift. Chronic foley in place, draining yellow urine. Pt can be incontinent of stool, bedpan in use, LBM 6/3, bowel regimen continued. Pt is Ax1 with RW to commode, witnessed pt reposition self in bed, weight shift assistance provided as needed. Bed alarm on, meds given per [facility], call bell within reach, bed in the lowest position, safety maintained, and will CTM. BP 121/64  - Pulse 77  - Temp 98.1 ?F (36.7 ?C) (Oral)  - Resp 16  - Ht 5' 11 (1.803 m)  - Wt 82 kg  - SpO2 97%  - BMI 25.21 kg/m? Revonda Castles, RN

## 2023-08-02 NOTE — Plan of Care
 Plan of Care Overview/ Patient StatusBP (!) 145/67  - Pulse 74  - Temp 98 ?F (36.7 ?C) (Oral)  - Resp 17  - Ht 5' 11 (1.803 m)  - Wt 82 kg  - SpO2 100%  - BMI 25.21 kg/m? Alert, oriented. VERY HOH. Hearing aide at bedside.Foley draining yellow urine.Patient has no c/o pain this shift. Compliant with care. Hourly rounding performed. Call bell with in reach. Bed alarm in use.Mark Yontz Rosario6/05/2023 8:17 PM

## 2023-08-03 NOTE — Progress Notes
 Assumed care at 16:00Patient medically ready for discharge. Awaiting transportation to [facility]. Safety maintained, call bell within reach, bed in lowest position. Patient sitting in bedside chair with active BA. [name], RN6/06/2023

## 2023-08-03 NOTE — Other
 Mark [name] was discharged via Ambulance accompanied by Alone.  Verbalized understanding of discharge instructionsand recommended follow up care as per the after visit summary.  Written discharge instructions provided. Denies any further questions. Vital signs    Vitals:  08/02/23 2031 08/02/23 2200 08/03/23 0507 08/03/23 1349 BP: 128/70  138/65 135/70 Pulse: 78  75 73 Resp: 17  17 17  Temp: 98 ?F (36.7 ?C)  97.5 ?F (36.4 ?C) 97.6 ?F (36.4 ?C) TempSrc: Oral Oral Oral Oral SpO2: 97%  99% 98% Weight:     Height:     Patient confirmed all belongings returned. Belongings charted in last 7 days: Patient Valuables   Patient Valuables Flowsheet                    PATIENT VALUABLE(S)       Clothing Disposition At bedside/locker/closet 07/22/23 0810  HEARING AID Disposition At bedside/locker/closet 07/22/23 0810   Cell phone disposition At bedside/locker/closet 07/22/23 0810  Jewelry disposition At bedside/locker/closet 07/22/23 0810          W-10 faxed and sent with patient. Report called and given to [facility] by [employer], Valuables accounted for including hearing aids. No further questions asked. [name], RN6/06/2023

## 2023-08-03 NOTE — Plan of Care
 Problem: Adult Inpatient Plan of CareGoal: Readiness for Transition of CareOutcome: Interventions implemented as appropriate Plan of Care Overview/ Patient StatusPer provider team, patient is medically ready for discharge (LOS - 12 days - Barrier - SNF). CM phone call to daughter Daivon Rayos - [phone]) for SNF choice selection - daughter updated messages left - [facility] and [facility] offered a bed. Daughter accepted SNF choice - SNF updated - sent for [employer] authorization. CM Dept will continue to follow for post acute needs.Yancey Helena, MSN, RN, [facility] ManagerYNHH Case Management Department

## 2023-08-03 NOTE — Other
 Writer called patient's daughter Mark [name] to let her know that [name] was ready for DC to [facility].   , MDResident-Physician PGY-1 - [facility] HealthPlease Contact via [facility]

## 2023-08-03 NOTE — Discharge Summary
 [facility] Discharge SummaryPatient Name: Mark [name]: 89 y.o.DOB: 01-16-1927MRN: [id number] Admit date: 5/23/2025Discharge date: 6/5/2025Discharge Attending Physician: Pat Bonier Primary Care Provider: Sherren Doe, MD Principal Diagnosis: Acute kidney injury (HC Code)Other Diagnosis: Proteus UTIDischarged Condition: fairDisposition: STRISSUES TO BE ADDRESSED POST DISCHARGE1. Goals of Care2. Rehab - recent fall3. Management of chronic foley - due for change next weekCC: fallHPI / RFA: 89yo w/ CAD, AAA, orthostatic hypotension on midodrine , BPH with chronic indwelling foley catheter, presenting from SNF after slow fall admitted due to concern for urosepsisHospital Course: Received 2L fluid and started on broad spectrum antibiotics. Catheter changed 2 weeks prior to admission, urine sample showing 4+ leukocytes, 10-20 WBC/HPF, brown/cloudy concerning for UTI. Grew Proteus. Hypotension resolved with fluid resuscitation. Course complicated by possible hypoactive delirium, which improved by day of discharge. Discharged backt o SNF - [facility].Discharge vitals: Blood pressure 116/62, pulse 90, temperature 97.9 ?F (36.6 ?C), temperature source Oral, resp. rate 20, height 5' 11 (1.803 m), weight 82 kg, SpO2 95%.Discharge Physical Exam:Exam: appears age, responds with cuing, AxO2 (thought it was December), missing few front teeth, wearing glasses, systolic murmur, unlabored breathing, soft abdomen, foley in place without signs of overlying cellulitis, no lower extremity edema Stopped midodrine  due to hypertension in the hospital. May have supine hypertension and orthostatic hypotension. May need to reintroduce midodrine . Would recommend stockings, education about slow change of positions.Pending Labs and Tests: Pending Lab Results   Order Current Status  Legionella and S. pneumoniae antigen, urine     (BH GH LMW YH) In process  MRSA screen by PCR In process  Respiratory virus PCR panel     (BH GH LMW Q YH) In process  Urine culture In process  Blood Culture - new concern (2 Bottles)   (BH GH L LMW YH) Preliminary result  Blood Culture - new concern (2 Bottles)   (BH GH L LMW YH) Preliminary result  Blood culture Preliminary result  Blood culture Preliminary result  Discharge Medications: Discharge: Current Discharge Medication List  CONTINUE these medications which have NOT CHANGED  Details acetaminophen  (TYLENOL ) 325 mg tablet Take 2 tablets (650 mg total) by mouth every 6 (six) hours as needed.  aspirin  81 mg EC delayed release tablet Take 1 tablet (81 mg total) by mouth daily.  atorvastatin  (LIPITOR) 80 mg tablet Take 1 tablet (80 mg total) by mouth daily.Qty: 30 tablet, Refills: 0  b complex vitamins (B COMPLEX) tablet Take 1 tablet by mouth daily.  camphor-menthoL  (SARNA) 0.5-0.5 % lotion Apply topically 2 (two) times daily as needed for itching.  clopidogreL  (PLAVIX ) 75 mg tablet Take 1 tablet (75 mg total) by mouth daily.Qty: 30 tablet, Refills: 0  escitalopram  oxalate (LEXAPRO ) 10 mg tablet Take 1 tablet (10 mg total) by mouth daily.Qty: 30 tablet, Refills: 0  gabapentin  (NEURONTIN ) 100 mg capsule Take 2 capsules (200 mg total) by mouth at bedtime.Qty: 60 capsule, Refills: 0  nitroGLYCERIN  (NITROSTAT ) 0.4 mg SL tablet Place 1 tablet (0.4 mg total) under the tongue as needed for chest pain.  polyethylene glycol (GLYCOLAX ; MIRALAX ) 17 gram/dose powder Take 17 g by mouth daily as needed for constipation.  senna (SENOKOT) 8.6 mg tablet Take 2 tablets (17.2 mg total) by mouth 2 (two) times daily.  sodium bicarbonate  650 mg tablet Take 1 tablet (650 mg total) by mouth 3 (three) times daily.   STOP taking these medications   midodrine  (PROAMATINE ) 2.5 mg tablet    omeprazole  (PRILOSEC) 20 mg capsule    Follow-up Information:Follow-up  with urology for management of foleyRuchit Erich Haus, MD, MPHInternal Medicine and Pediatrics, PGY4[facility] HospitalContact on MHBFor further details see attending addendumAttending StatementI have seen/evaluated the patient and agree with the history, physical exam and plan as documented by the Resident Admitted with proteus vulgaris UTI/ AKI in setting of chronic indwelling Foley. Treated with antibiotics, IVF. Indwelling Foley due for change next week. To follow up with PCPElectronically Signed:Kamilia Carollo Mawulawoe Khristin Keleher, MD6/5/20252:55 PM

## 2023-08-03 NOTE — Plan of Care
 Plan of Care Overview/ Patient StatusBP 135/70  - Pulse 73  - Temp 97.6 ?F (36.4 ?C) (Oral)  - Resp 17  - Ht 5' 11 (1.803 m)  - Wt 82 kg  - SpO2 98%  - BMI 25.21 kg/m? Alert, oriented x4.Oob to reclining chair with assistance. Bed alarm in use. Hearing aides at bedside.[facility]. Report called to Nurse Peterson Brandt there.W10 faxed to [facility] this after noon. No distress noted.Foley draining yellow urine. Awaiting ambulance. Davidson Palmieri Rosario6/06/2023 4:26 PM

## 2023-08-03 NOTE — Progress Notes
 Pt is Alert+Oriented x4. VSS on RA.Appropriate behavior for situation. No c/o SOB/ PAIN.   Unlabored breathing, equal chest expansion.Abdomen soft, non tender, non distended, +bowel sounds. On regular diet with no report of n/v. Chronic foley in place, draining yellow urine.No observed/ reported abnormalities with urine color/ characteristics.  Bed in low position, call bell within reach, safety precautions maintained, bed alarm on. Will continue to monitor pt. See flow sheet for further assessment.Brittany Osier Jerelene Monday, RN

## 2023-08-03 NOTE — Care Coordination-Inpatient
 INSURANCE AUTH OBTAINED FOR STR 08/03/23 1191 Authorization Information Date Authorization Initiated:  08/03/23 Time Authorization Initiated: 0838 Mode Clinical was sent: Portal Facility Name [facility] Facility Authorization Yes, [place] updated Insurance Company [employer] Medicare Insurance Co. Solicitor Name/# Home and [employer] Authorization #/Details [id number], 5 Days, 6/5-6/9 Date Authorization recieved 08/03/23 Time Authorization recieved 0945 Follow up contact necessary Yes Contact Name Home and Community Contact phone: [phone] [facility] NilanTransition CoordinatorCare Management DepartmentYale-[facility] [place], [place] Phone: [phone]

## 2023-08-03 NOTE — Care Coordination-Inpatient
 PENDING INSURANCE AUTHORIZATION THIS PATIENT CANNOT BE DISCHARGED UNTIL DETERMINATION OBTAINEDSecured facility: [facility] has been initiated with this patient's insurer. Clinical information submitted to this patient's insurer for review today. We now await the authorization that will allow this patient to transfer to the facility.   Insurance Contact: Home and CommunityPhone: [phone]: Pending Case No.: [id number] [facility] NilanTransition CoordinatorCare Management DepartmentYale-[facility] [place], [place] Phone: [phone]

## 2023-08-04 ENCOUNTER — Encounter: Admit: 2023-08-04 | Payer: PRIVATE HEALTH INSURANCE | Primary: Internal Medicine

## 2023-08-07 ENCOUNTER — Encounter: Admit: 2023-08-07 | Payer: PRIVATE HEALTH INSURANCE | Attending: Internal Medicine | Primary: Internal Medicine

## 2023-08-16 ENCOUNTER — Inpatient Hospital Stay
Admit: 2023-08-16 | Discharge: 2023-08-16 | Payer: Medicare (Managed Care) | Attending: Student in an Organized Health Care Education/Training Program

## 2023-08-16 ENCOUNTER — Encounter: Admit: 2023-08-16 | Payer: PRIVATE HEALTH INSURANCE | Primary: Internal Medicine

## 2023-08-16 ENCOUNTER — Encounter: Admit: 2023-08-16 | Payer: PRIVATE HEALTH INSURANCE | Attending: Nephrology | Primary: Internal Medicine

## 2023-08-16 ENCOUNTER — Ambulatory Visit: Admit: 2023-08-16 | Payer: PRIVATE HEALTH INSURANCE | Attending: Nephrology | Primary: Internal Medicine

## 2023-08-16 VITALS — BP 139/66 | HR 77 | Temp 98.00000°F | Ht 71.0 in

## 2023-08-16 DIAGNOSIS — K219 Gastro-esophageal reflux disease without esophagitis: Secondary | ICD-10-CM

## 2023-08-16 DIAGNOSIS — Z955 Presence of coronary angioplasty implant and graft: Secondary | ICD-10-CM

## 2023-08-16 DIAGNOSIS — I252 Old myocardial infarction: Secondary | ICD-10-CM

## 2023-08-16 DIAGNOSIS — D568 Other thalassemias: Secondary | ICD-10-CM

## 2023-08-16 DIAGNOSIS — I714 AAA (abdominal aortic aneurysm) (HC Code): Secondary | ICD-10-CM

## 2023-08-16 DIAGNOSIS — J309 Allergic rhinitis, unspecified: Secondary | ICD-10-CM

## 2023-08-16 DIAGNOSIS — Z79899 Other long term (current) drug therapy: Secondary | ICD-10-CM

## 2023-08-16 DIAGNOSIS — J329 Chronic sinusitis, unspecified: Secondary | ICD-10-CM

## 2023-08-16 DIAGNOSIS — N184 Chronic kidney disease, stage 4 (severe): Secondary | ICD-10-CM

## 2023-08-16 DIAGNOSIS — I6529 Occlusion and stenosis of unspecified carotid artery: Secondary | ICD-10-CM

## 2023-08-16 DIAGNOSIS — N400 Enlarged prostate without lower urinary tract symptoms: Secondary | ICD-10-CM

## 2023-08-16 DIAGNOSIS — I2510 Atherosclerotic heart disease of native coronary artery without angina pectoris: Secondary | ICD-10-CM

## 2023-08-16 DIAGNOSIS — Z960 Presence of urogenital implants: Secondary | ICD-10-CM

## 2023-08-16 DIAGNOSIS — N179 Acute kidney failure, unspecified: Principal | ICD-10-CM

## 2023-08-16 DIAGNOSIS — E782 Mixed hyperlipidemia: Secondary | ICD-10-CM

## 2023-08-16 DIAGNOSIS — N189 Chronic kidney disease, unspecified: Secondary | ICD-10-CM

## 2023-08-16 DIAGNOSIS — H9193 Unspecified hearing loss, bilateral: Secondary | ICD-10-CM

## 2023-08-16 DIAGNOSIS — Z87891 Personal history of nicotine dependence: Secondary | ICD-10-CM

## 2023-08-16 DIAGNOSIS — Z8679 Personal history of other diseases of the circulatory system: Secondary | ICD-10-CM

## 2023-08-16 DIAGNOSIS — D509 Iron deficiency anemia, unspecified: Secondary | ICD-10-CM

## 2023-08-16 DIAGNOSIS — M4800 Spinal stenosis, site unspecified: Secondary | ICD-10-CM

## 2023-08-16 DIAGNOSIS — I10 Essential (primary) hypertension: Secondary | ICD-10-CM

## 2023-08-16 DIAGNOSIS — E785 Hyperlipidemia, unspecified: Secondary | ICD-10-CM

## 2023-08-16 DIAGNOSIS — I5030 Unspecified diastolic (congestive) heart failure: Secondary | ICD-10-CM

## 2023-08-16 DIAGNOSIS — Z7902 Long term (current) use of antithrombotics/antiplatelets: Secondary | ICD-10-CM

## 2023-08-16 DIAGNOSIS — Z7982 Long term (current) use of aspirin: Secondary | ICD-10-CM

## 2023-08-16 DIAGNOSIS — M545 Low back pain, unspecified: Secondary | ICD-10-CM

## 2023-08-16 LAB — BASIC METABOLIC PANEL
BKR ANION GAP: 14 (ref 7–17)
BKR BLOOD UREA NITROGEN: 40 mg/dL — ABNORMAL HIGH (ref 8–23)
BKR BUN / CREAT RATIO: 11.1 (ref 8.0–23.0)
BKR CALCIUM: 8.9 mg/dL (ref 8.8–10.2)
BKR CHLORIDE: 107 mmol/L (ref 98–107)
BKR CO2: 18 mmol/L — ABNORMAL LOW (ref 20–30)
BKR CREATININE DELTA: 0.2
BKR CREATININE: 3.6 mg/dL — ABNORMAL HIGH (ref 0.40–1.30)
BKR EGFR, CREATININE (CKD-EPI 2021): 15 mL/min/{1.73_m2} — ABNORMAL LOW (ref >=60)
BKR GLUCOSE: 104 mg/dL — ABNORMAL HIGH (ref 70–100)
BKR POTASSIUM: 4.8 mmol/L (ref 3.3–5.3)
BKR SODIUM: 139 mmol/L (ref 136–144)

## 2023-08-16 LAB — PHOSPHORUS     (BH GH L LMW YH): BKR PHOSPHORUS: 3.1 mg/dL (ref 2.2–4.5)

## 2023-08-16 LAB — CBC WITH AUTO DIFFERENTIAL
BKR WAM ABSOLUTE IMMATURE GRANULOCYTES.: 0.03 x 1000/ÂµL (ref 0.00–0.30)
BKR WAM ABSOLUTE LYMPHOCYTE COUNT.: 1.17 x 1000/ÂµL (ref 0.60–3.70)
BKR WAM ABSOLUTE NRBC: 0 x 1000/ÂµL (ref 0.00–1.00)
BKR WAM ANC (ABSOLUTE NEUTROPHIL COUNT): 4.77 x 1000/ÂµL (ref 2.00–7.60)
BKR WAM BASOPHIL ABSOLUTE COUNT.: 0.06 x 1000/ÂµL (ref 0.00–1.00)
BKR WAM BASOPHILS: 0.8 % (ref 0.0–1.4)
BKR WAM EOSINOPHIL ABSOLUTE COUNT.: 0.65 x 1000/ÂµL (ref 0.00–1.00)
BKR WAM EOSINOPHILS: 8.8 % — ABNORMAL HIGH (ref 0.0–5.0)
BKR WAM HEMATOCRIT: 25 % — ABNORMAL LOW (ref 38.50–50.00)
BKR WAM HEMOGLOBIN: 7.8 g/dL — ABNORMAL LOW (ref 13.2–17.1)
BKR WAM IMMATURE GRANULOCYTES: 0.4 % (ref 0.0–1.0)
BKR WAM LYMPHOCYTES: 15.8 % — ABNORMAL LOW (ref 17.0–50.0)
BKR WAM MCH: 23.4 pg — ABNORMAL LOW (ref 27.0–33.0)
BKR WAM MCHC: 31.2 g/dL (ref 31.0–36.0)
BKR WAM MCV: 75.1 fL — ABNORMAL LOW (ref 80.0–100.0)
BKR WAM MONOCYTE ABSOLUTE COUNT.: 0.72 x 1000/ÂµL (ref 0.00–1.00)
BKR WAM MONOCYTES: 9.7 % (ref 4.0–12.0)
BKR WAM MPV: 10 fL (ref 8.0–12.0)
BKR WAM NEUTROPHILS: 64.5 % (ref 39.0–72.0)
BKR WAM NUCLEATED RED BLOOD CELLS: 0 % (ref 0.0–1.0)
BKR WAM PLATELETS: 284 x1000/ÂµL (ref 150–420)
BKR WAM RDW-CV: 17.2 % — ABNORMAL HIGH (ref 11.0–15.0)
BKR WAM RED BLOOD CELL COUNT.: 3.33 M/ÂµL — ABNORMAL LOW (ref 4.00–6.00)
BKR WAM WHITE BLOOD CELL COUNT: 7.4 x1000/ÂµL (ref 4.0–11.0)

## 2023-08-16 LAB — MAGNESIUM: BKR MAGNESIUM: 1.8 mg/dL (ref 1.7–2.4)

## 2023-08-16 MED ORDER — LIDOCAINE 4 % TOPICAL PATCH
4 | TRANSDERMAL | Status: DC
Start: 2023-08-16 — End: 2023-08-17

## 2023-08-16 MED ORDER — CLONAZEPAM 0.5 MG TABLET
0.5 | Freq: Two times a day (BID) | ORAL | Status: AC
Start: 2023-08-16 — End: ?

## 2023-08-16 MED ORDER — ACETAMINOPHEN 325 MG TABLET
325 | Freq: Once | ORAL | Status: CP
Start: 2023-08-16 — End: ?
  Administered 2023-08-16: 16:00:00 325 mg via ORAL

## 2023-08-16 MED ORDER — LIDOCAINE 4 % TOPICAL PATCH
4 | MEDICATED_PATCH | TRANSDERMAL | 1 refills | 14.00000 days | Status: AC
Start: 2023-08-16 — End: ?
  Filled 2023-08-17: qty 14, 14d supply, fill #0

## 2023-08-16 NOTE — Care Coordination-Inpatient
 CM completed chart review; 5 inpatient admissions, recent stays at [facility]; [facility]; [place], [place], [facility] [facility]. Vishal Sandlin is a 89 y.o. man with history of CAD, HTN, HFpEF, AAA, carotid artery stenosis, BPH with chronic foley, hx urosepsis requiring hospitalization, CKD 4-5 now initial evaluation of CKD. Lives with daughter and has home care services. Case Management/Social Work Geophysicist/field seismologist Most Recent Value Case Management/Social Work Screening: Chart review completed. If YES to any question below then proceed to Eval/Plan  Is there a change in their cognitive function No Do you anticipate that the patient will have any discharge needs requiring CM/SW intervention? Yes Has there been an unscheduled readmission within the last 30 days and/or four (4) encounters (encounters include: ED, OBS, Inpatient) within the last six (6) months? Yes Were there services prior to admission ( Examples: Acute [facility],  Assisted Living, HD, Homecare, Extended Care Facility, Methadone, SNF, Outpatient Infusion Center) Yes Negative/Positive Screen Positive Screening: Complete CM/SW Evaluation and Plan Case Manager/Social Work Attestation  I have reviewed the medical record and completed the above screen. CM/SW staff will follow patient's progress and discuss the plan of care with the Treatment Team. Yes Case Management/Social Work Evaluation and Plan  Arrived from prior to admission MD office Lives with Daughter, Adult Services Prior to Admission home care agency (specify services) Home care agency name: [facility] Agency Services:  RN visit, PT Patient Requires Transition of Care Intervention Due To Change in physical function Prior to Hospitalization: Assistance Needed/DME being used Ambulation Ambulation Assistance/DME: IT sales professional, Stairs within the home, Stairs to enter the home, Agricultural consultant, Occupational hygienist, Chair lift Documented Insurance Accurate Yes Any financial concerns related to anticipated discharge needs No Patient's home address verified Yes Patient's PCP of record verified Yes Source of Clinical History  Patient's clinical history has been reviewed and source of Information is: Civil Service fast streamer, Medical Record Discharge Planning Coordination Recommendations  Discharge Planning Coordination Recommendations Needs not determined at this time Case Manager/Social Worker reviewed plan of care/ continuum of care need's with  Interdisciplinary Team  CM will follow patient's progress with team and arrange post acute care services if needed. Mar Semen, BSN, MPH, ACM-RNPer Diem Care Manager Care Management Department [phone] [facility])
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
## 2023-08-16 NOTE — Discharge Instructions
 You may take Motrin (also called Advil, Ibuprofen) every 6 hours or Tylenol (Acetaminophen) every 4 hours as needed for pain or fever. Please alternate giving these medications (for example: give Motrin first, if there's still no relief after 30 minutes, give Tylenol). Please do not take more than 4 doses each of these medications in a 24 hour period or you can develop severe complications from these medications.Please follow up with your primary care doctor. Read package inserts for all medications prior to taking them.Return to the ED for new or worsening symptoms including fever >100.4 F, shortness of breath, and chest pain.

## 2023-08-16 NOTE — Progress Notes
 Initial visit referred from Mark O Noel-Vulpe, APRN  for evaluation of AKI on CKD. HPI: Mark [name] is a 89 y.o. man with history of CAD, HTN, HFpEF, AAA, carotid artery stenosis, BPH with chronic foley, hx urosepsis requiring hospitalization, CKD 4-5 who presents for initial evaluation of CKD. He has had 7 hospitalizations over the past year. Recent notable hospitalizations:07/21/2023 - 08/03/2023 - fall at SNF, hypotensive on arrival, urine growing proteus, treated as urosepsis. BP improved and midodrine  (which was for orthostatic hypotension) was stopped. 05/2023 - COVID-19 causing hypoxemia requiring BiPAP, treated with remdesivir  and dexamethasone  with improvement. Also treated with abx for bacturia and foley exchanged.04/2023 - fall, found to have UTI. Also had AKI on CKD. secondary to foley obstruction (Mark [name] showed hydronephrosis, foley was changed followed by copious UOP) and contrast-associated AKI. Creatinine baseline 2.5, on admission 3.68, 3.30 on discharge.03/2023 - fall, found to have AKI on CKD (cr up to 3.2 from 2), with hypotension/orthostatic hypotensive in the setting of diarrhea. BP meds held and started on midodrine . Cr 2.4 on discharge. 01/2023 - AKI with creatinine up to 4.0, due to obstriction - foley placed again.11/2022 - AKI with creatinine up to 6.9, due to obstruction - foley placed initially. 08/2022 - abdominal pain, found to have increased size of known AAA, though abd pain attributed to UTI. Not proteinuric from the pre-foley samples from last year though with pyuria and hematuria. TODAYPatient arrived in clinic with complaint of severe back and abdominal pain.He is accompanied by his daughter who reports the pain started on Monday, got noticeably worse yesterday, continues to be quite severe today. Worse with moving the patient eg from wheelchair to examination room chair and he needs to lie down for this. He was discharged home from SNF on Monday, pain was not there at the time of SNF discharge. When the pain was starting later on Monday, patient complained of back pain initially and attributed it to having a new bed at home compared to the hospital bed at [facility] of urine coming out from the foley seems to be normal amount. However daughter feels that the colour is more pink than before.He had 3 bowel movements on Monday night, none since then.No fevers or chills at home.Exam notable for tenderness to palpation at suprapubic area, and left CVA tenderness, no right RVA tenderness. Foley in place draining cloudy urine with faint pink tinge. Past Medical History Mark [name] has a past medical history of (HFpEF) heart failure with preserved ejection fraction (HC Code), AAA (abdominal aortic aneurysm) (HC Code) (05/05/2015), Allergic rhinitis (07/24/2019), Bilateral hearing loss (01/28/2019), BPH (benign prostatic hyperplasia), CAD (coronary artery disease) (05/05/2015), Carotid artery stenosis (06/07/2016), Chronic coronary artery disease, Chronic sinusitis (07/24/2019), CKD (chronic kidney disease) stage 4, GFR 15-29 ml/min (HC Code), Gastroesophageal reflux disease without esophagitis (05/05/2015), GERD (gastroesophageal reflux disease), History of coronary artery stent placement (06/07/2016), Hyperlipidemia, Hypertension, Microcytic anemia, Mixed hyperlipidemia (07/24/2019), Old myocardial infarct (05/05/2015), Other thalassemia (HC Code) (07/24/2019), and Spinal stenosis. Social History Mark [name] reports that he has quit smoking. He has never been exposed to tobacco smoke. He has never used smokeless tobacco. He reports that he does not currently use alcohol . He reports that he does not use drugs. Family History family history includes Heart disease in his mother.   Medications Current Medications[1]  Allergies Allergies[2] Review of Systems: General: NegativeCardiac: NegativePulmonary: NegativeGI: NegativeUrologic: NegativeMusculoskeletal: NegativeSkin: NegativePsychiatric: NegativeInfectious Disease: NegativeNeurologic: NegativeVital Signs : BP 139/66 (Site: l a, Position: Sitting, Cuff Size: Medium)  - Pulse 77  -  Temp 98 ?F (36.7 ?C) (Temporal)  - Ht 5' 11 (1.803 m)  - SpO2 99%  - BMI 25.21 kg/m? BP Readings from Last 3 Encounters: 08/16/23 139/66 08/03/23 135/70 06/14/23 (!) 144/73 Wt Readings from Last 3 Encounters: 07/22/23 82 kg 06/10/23 81.6 kg 06/07/23 84.2 kg Physical Exam: General: Elderly frail appearing gentleman, appears uncomfortableHEENT: Normocephalic and atraumatic.Neck: no LAD, thyromegalyAbdomen: nondistended, soft, +tenderness to palpation at subrapubic area, no reboundLeft CVA tenderness present. No right CVA tenderness. Skin: No rashesNeuro: Grossly intactLaboratory Data: Lab Results Component Value Date  WBC 7.5 07/31/2023  HGB 7.5 (L) 07/31/2023  HCT 24.90 (L) 07/31/2023  PLT 315 07/31/2023  GLU 93 07/31/2023  BUN 44 (H) 07/31/2023  CREATININE 3.40 (H) 07/31/2023  CO2 20 07/31/2023  CL 106 07/31/2023  NA 137 07/31/2023  K 5.1 07/31/2023  CALCIUM  8.4 (L) 07/31/2023  PHOS 4.1 07/31/2023  PTH 81.5 (H) 05/12/2023  HGBA1C 6.4 (H) 12/18/2022  IRON 44 (L) 07/21/2023  TIBC 179 (L) 07/21/2023  FERRITIN 1,184 (H) 07/21/2023 Assessment/Plan: Mark [name] is a 89 y.o. man with history of CAD, HTN, HFpEF, AAA, carotid artery stenosis, BPH with chronic foley, hx urosepsis requiring hospitalization, CKD 4-5 who presents for initial evaluation of CKD. New abdominal pain x 2 days, daughter reports it is worsening, cause of which is unclear and differential is broad including urinary obstruction, constipation, ?mesenteric ischemia given his vascular disease history, among others. Referring patient to ED for further evaluation. Family will transport patient to ED. Call made to ED admin. Follow up - reschedule after ED or hospital discharge. Mark [name]) Mark [name], MDNephrology Fellow06/18/25 [1] Current Outpatient Medications Medication Sig Dispense Refill  acetaminophen  (TYLENOL ) 325 mg tablet Take 2 tablets (650 mg total) by mouth every 6 (six) hours as needed.    aspirin  81 mg EC delayed release tablet Take 1 tablet (81 mg total) by mouth daily.    atorvastatin  (LIPITOR) 80 mg tablet Take 1 tablet (80 mg total) by mouth daily. 30 tablet 0  b complex vitamins (B COMPLEX) tablet Take 1 tablet by mouth daily.    camphor-menthoL  (SARNA) 0.5-0.5 % lotion Apply topically 2 (two) times daily as needed for itching.    clonazePAM (KLONOPIN) 0.5 mg tablet Take 1 tablet (0.5 mg total) by mouth 2 (two) times daily.    clopidogreL  (PLAVIX ) 75 mg tablet Take 1 tablet (75 mg total) by mouth daily. 30 tablet 0  escitalopram  oxalate (LEXAPRO ) 10 mg tablet Take 1 tablet (10 mg total) by mouth daily. 30 tablet 0  gabapentin  (NEURONTIN ) 100 mg capsule Take 2 capsules (200 mg total) by mouth at bedtime. 60 capsule 0  nitroGLYCERIN  (NITROSTAT ) 0.4 mg SL tablet Place 1 tablet (0.4 mg total) under the tongue as needed for chest pain.    polyethylene glycol (GLYCOLAX ; MIRALAX ) 17 gram/dose powder Take 17 g by mouth daily as needed for constipation.    senna (SENOKOT) 8.6 mg tablet Take 2 tablets (17.2 mg total) by mouth 2 (two) times daily.    sodium bicarbonate  650 mg tablet Take 1 tablet (650 mg total) by mouth 3 (three) times daily.   No current facility-administered medications for this visit. [2] AllergiesAllergen Reactions  Ace Inhibitors Cough

## 2023-08-19 NOTE — ED Provider Notes
 Chief Complaint Patient presents with  Abdominal Pain  Back Pain   Pt referred to ED for eval from OP nephrology for eval of abd pain x 2 days, reports severe back pain x 2 days. Pt w/ chronic Foley catheter, pt's dtr states that there was scant hematuria in the catheter bag this morning. 89 year old male with history of CAD, AAA, orthostatic hypotension on midodrine , BPH with chronic foley, recent discharge from SNF (admitted for urosepsis 2/2 proteus), hypoactive delirium presenting from nephrology clinic today for evaluation for back pain. Per patient's daughter, patient recently has started sleeping on a hospital bed on their first floor and has been complaining of low back pain due to uncomfortable mattress. They are currently waiting for a softer pad to but on the mattress so that he can be more comfortable. Today he took an old vicodin they had in their medicine cabinet for the pain and it did not give him any relief. He was at his outpatient nephrology office today for follow up visit after his hospitalization and they sent him to the ED. Patient has not had any new falls or trama. Patient points to his lower coccyx region when asked to localize the pain, denies flank pain or abdominal pain. Patient does not radiate. Patient denies vomiting, diarrhea, fevers. His foley has been draining consistently, had some streaks of blood yesterday but urine is yellow today. Patient lives with daughter, has visiting PT and nursing.Ddx: foley problem, AKI, hydronephrosis, pyelonephritis, musculoskeletal back painDoubt pyelonephritis given patient does not have fever, CVA tenderness, nausea, or vomiting. Doubt urinary retention or catheter problem given foley is draining and bladder scan showing 14 cc of urine / no retention.Patient given tylenol  and lidocaine  patch for pain control with improvement in symptoms.5:23 PM Attempt to call patient's daughter but she was unavailable 6:04 PM Attempted to call patient's daughter again7:19 PM Spoke with patient's daughter [name], she feels comfortable taking [place] home and will follow up with his outpatient providersPatient stable for discharge home with close follow up with primary care physician. Plan of care discussed with patient and education provided. Patient encouraged to return to ED if they develop new or worsening symptoms.Lorenda KANDICE Rake, MDPGY-3, [facility]  MDM  Physical ExamED Triage Vitals [08/16/23 1523]BP: 112/64Pulse: 75Pulse from  O2 sat: n/aResp: 18Temp: 97 ?F (36.1 ?C)Temp src: OralSpO2: 96 % BP (!) 157/70  - Pulse 79  - Temp 97.1 ?F (36.2 ?C) (Oral)  - Resp 17  - SpO2 98% Physical ExamVitals and nursing note reviewed. Constitutional:     General: He is not in acute distress.   Appearance: He is not ill-appearing. HENT:    Head: Normocephalic and atraumatic.    Right Ear: External ear normal.    Left Ear: External ear normal.    Mouth/Throat:    Mouth: Mucous membranes are moist. Eyes:    General: No scleral icterus.   Extraocular Movements: Extraocular movements intact.    Conjunctiva/sclera: Conjunctivae normal.    Pupils: Pupils are equal, round, and reactive to light. Cardiovascular:    Rate and Rhythm: Normal rate and regular rhythm. Pulmonary:    Effort: Pulmonary effort is normal. No respiratory distress.    Breath sounds: No stridor. No wheezing. Abdominal:    General: There is no distension.    Palpations: Abdomen is soft.    Tenderness: There is no abdominal tenderness. There is no guarding or rebound. Musculoskeletal:       General: No deformity.  Cervical back: Normal.    Thoracic back: Normal.    Lumbar back: Normal range of motion.    Comments: Coccyx pain Skin:   General: Skin is warm and dry.    Coloration: Skin is not pale.    Findings: No erythema or rash. Neurological: General: No focal deficit present.    Mental Status: He is alert. Mental status is at baseline. Psychiatric:       Behavior: Behavior normal.       Thought Content: Thought content normal.  ProceduresAttestation/Critical CarePatient Reevaluation: Attending Supervised: ResidentI saw and examined the patient. I agree with the findings and plan of care as documented in the resident's note except as noted below. Additional acute and/or chronic problems addressed: 89 year old male, recently admitted for urosepsis was discharged to SNF, then discharged home with home RN and PT, has a hospital bed at home, and states he has had low back pain which he attributes to the hospital bed. Has history of chronic foley for history BPH. The triage note states chief complaint of abdominal pain, patient denies any abdominal pain.  Abdomen is soft and nontender to palpation.  Patient has clear yellow urine in his Foley bag, no clots or bright red blood.  He denies any suprapubic pressure, pain around the Foley, flank pain.  Low suspicion of UTI.  States that his back pain is in his lower middle back, which he attributes to the new bed he has not home.On reassessment after acetaminophen  lidocaine  patch, patient states his back pain has significantly improved, no significant tenderness to palpation, no step-offs.  Labs reviewed, stable as compared to prior.  Stable for discharge home close outpatient follow up.Tarvaris Puglia SurapaneniClinical Impressions as of 08/19/23 0122 Lumbar spine pain  ED DispositionDischarge  Isaiah Lorenda Fellows, MDResident06/18/25 1600 Darice Gang, MD06/18/25 1641 Isaiah Lorenda Fellows, MDResident06/18/25 1723 Isaiah Lorenda Fellows, MDResident06/18/25 1804 Darice Gang, MD06/18/25 1813 Isaiah Lorenda Fellows, MDResident06/18/25 [address], MD06/21/25 [phone]

## 2023-09-03 ENCOUNTER — Emergency Department: Admit: 2023-09-03 | Payer: Medicare (Managed Care) | Primary: Internal Medicine

## 2023-09-03 ENCOUNTER — Inpatient Hospital Stay
Admit: 2023-09-03 | Discharge: 2023-09-21 | Payer: Medicare (Managed Care) | Attending: Internal Medicine | Admitting: Hospitalist | Primary: Internal Medicine

## 2023-09-03 ENCOUNTER — Encounter: Admit: 2023-09-03 | Payer: PRIVATE HEALTH INSURANCE | Attending: Hospitalist | Primary: Internal Medicine

## 2023-09-03 DIAGNOSIS — I6529 Occlusion and stenosis of unspecified carotid artery: Secondary | ICD-10-CM

## 2023-09-03 DIAGNOSIS — H9193 Unspecified hearing loss, bilateral: Secondary | ICD-10-CM

## 2023-09-03 DIAGNOSIS — M4800 Spinal stenosis, site unspecified: Secondary | ICD-10-CM

## 2023-09-03 DIAGNOSIS — I714 AAA (abdominal aortic aneurysm) (HC Code): Secondary | ICD-10-CM

## 2023-09-03 DIAGNOSIS — E785 Hyperlipidemia, unspecified: Principal | ICD-10-CM

## 2023-09-03 DIAGNOSIS — I252 Old myocardial infarction: Secondary | ICD-10-CM

## 2023-09-03 DIAGNOSIS — N400 Enlarged prostate without lower urinary tract symptoms: Secondary | ICD-10-CM

## 2023-09-03 DIAGNOSIS — N184 Chronic kidney disease, stage 4 (severe): Secondary | ICD-10-CM

## 2023-09-03 DIAGNOSIS — D568 Other thalassemias: Secondary | ICD-10-CM

## 2023-09-03 DIAGNOSIS — D509 Iron deficiency anemia, unspecified: Secondary | ICD-10-CM

## 2023-09-03 DIAGNOSIS — I2510 Atherosclerotic heart disease of native coronary artery without angina pectoris: Secondary | ICD-10-CM

## 2023-09-03 DIAGNOSIS — K219 Gastro-esophageal reflux disease without esophagitis: Secondary | ICD-10-CM

## 2023-09-03 DIAGNOSIS — I5030 Unspecified diastolic (congestive) heart failure: Secondary | ICD-10-CM

## 2023-09-03 DIAGNOSIS — J329 Chronic sinusitis, unspecified: Secondary | ICD-10-CM

## 2023-09-03 DIAGNOSIS — E782 Mixed hyperlipidemia: Secondary | ICD-10-CM

## 2023-09-03 DIAGNOSIS — I10 Essential (primary) hypertension: Secondary | ICD-10-CM

## 2023-09-03 DIAGNOSIS — Z955 Presence of coronary angioplasty implant and graft: Secondary | ICD-10-CM

## 2023-09-03 DIAGNOSIS — J309 Allergic rhinitis, unspecified: Secondary | ICD-10-CM

## 2023-09-03 LAB — HEPATIC FUNCTION PANEL
BKR A/G RATIO: 1 (ref 1.0–2.2)
BKR ALANINE AMINOTRANSFERASE (ALT): 19 U/L (ref 9–59)
BKR ALBUMIN: 3.9 g/dL (ref 3.6–5.1)
BKR ALKALINE PHOSPHATASE: 204 U/L — ABNORMAL HIGH (ref 9–122)
BKR ASPARTATE AMINOTRANSFERASE (AST): 53 U/L — ABNORMAL HIGH (ref 10–35)
BKR AST/ALT RATIO: 2.8
BKR BILIRUBIN TOTAL: 0.5 mg/dL (ref <=1.2)
BKR GLOBULIN: 4.1 g/dL — ABNORMAL HIGH (ref 2.0–3.9)
BKR PROTEIN TOTAL: 8 g/dL (ref 5.9–8.3)

## 2023-09-03 LAB — BASIC METABOLIC PANEL
BKR ANION GAP: 17 (ref 7–17)
BKR BLOOD UREA NITROGEN: 47 mg/dL — ABNORMAL HIGH (ref 8–23)
BKR BUN / CREAT RATIO: 13.4 (ref 8.0–23.0)
BKR CALCIUM: 8.8 mg/dL (ref 8.8–10.2)
BKR CHLORIDE: 104 mmol/L (ref 98–107)
BKR CO2: 15 mmol/L — ABNORMAL LOW (ref 20–30)
BKR CREATININE DELTA: -0.48
BKR CREATININE: 3.52 mg/dL — ABNORMAL HIGH (ref 0.40–1.30)
BKR EGFR, CREATININE (CKD-EPI 2021): 15 mL/min/1.73m2 — ABNORMAL LOW (ref >=60)
BKR GLUCOSE: 151 mg/dL — ABNORMAL HIGH (ref 70–100)
BKR POTASSIUM: 5.1 mmol/L (ref 3.3–5.3)
BKR SODIUM: 136 mmol/L (ref 136–144)

## 2023-09-03 LAB — TROPONIN T HIGH SENSITIVITY, 3 HOUR (BH GH LMW YH)
BKR TROPONIN T HS 1 HOUR DELTA FROM 0 HOUR ON 3HR: -9 ng/L
BKR TROPONIN T HS 3 HOUR DELTA FROM 0 HOUR: -1 ng/L
BKR TROPONIN T HS 3 HOUR: 113 ng/L — CR

## 2023-09-03 LAB — URINALYSIS WITH CULTURE REFLEX      (BH LMW YH)
BKR BILIRUBIN, UA: NEGATIVE
BKR GLUCOSE, UA: NEGATIVE
BKR KETONES, UA: NEGATIVE
BKR NITRITE, UA: NEGATIVE
BKR PH, UA: 6.5 (ref 5.5–7.5)
BKR SPECIFIC GRAVITY, UA: 1.012 (ref 1.005–1.030)
BKR UROBILINOGEN, UA: <2 mg/dL (ref <=2.0)

## 2023-09-03 LAB — BLOOD GAS, VENOUS (BH YH)
BKR BASE EXCESS, VENOUS: -7 mmol/L
BKR CALCULATED HCO3, VENOUS: 17.1 mmol/L
BKR O2 SATURATION VENOUS: 28 %
BKR PCO2, VENOUS: 28 mmHg — ABNORMAL LOW (ref 38–54)
BKR PH, VENOUS: 7.39 U (ref 7.32–7.43)
BKR PO2, VENOUS: <30 mmHg — ABNORMAL LOW (ref 40–50)

## 2023-09-03 LAB — CBC WITH AUTO DIFFERENTIAL
BKR WAM ABSOLUTE IMMATURE GRANULOCYTES.: 0.04 x 1000/ÂµL (ref 0.00–0.30)
BKR WAM ABSOLUTE LYMPHOCYTE COUNT.: 0.67 x 1000/ÂµL (ref 0.60–3.70)
BKR WAM ABSOLUTE NRBC: 0 x 1000/ÂµL (ref 0.00–1.00)
BKR WAM ANC (ABSOLUTE NEUTROPHIL COUNT): 7.82 x 1000/ÂµL — ABNORMAL HIGH (ref 2.00–7.60)
BKR WAM BASOPHIL ABSOLUTE COUNT.: 0.02 x 1000/ÂµL (ref 0.00–1.00)
BKR WAM BASOPHILS: 0.2 % (ref 0.0–1.4)
BKR WAM EOSINOPHIL ABSOLUTE COUNT.: 0.26 x 1000/ÂµL (ref 0.00–1.00)
BKR WAM EOSINOPHILS: 2.8 % (ref 0.0–5.0)
BKR WAM HEMATOCRIT: 30.5 % — ABNORMAL LOW (ref 38.50–50.00)
BKR WAM HEMOGLOBIN: 9.4 g/dL — ABNORMAL LOW (ref 13.2–17.1)
BKR WAM IMMATURE GRANULOCYTES: 0.4 % (ref 0.0–1.0)
BKR WAM LYMPHOCYTES: 7.3 % — ABNORMAL LOW (ref 17.0–50.0)
BKR WAM MCH: 22.8 pg — ABNORMAL LOW (ref 27.0–33.0)
BKR WAM MCHC: 30.8 g/dL — ABNORMAL LOW (ref 31.0–36.0)
BKR WAM MCV: 73.8 fL — ABNORMAL LOW (ref 80.0–100.0)
BKR WAM MONOCYTE ABSOLUTE COUNT.: 0.39 x 1000/ÂµL (ref 0.00–1.00)
BKR WAM MONOCYTES: 4.2 % (ref 4.0–12.0)
BKR WAM MPV: 9.3 fL (ref 8.0–12.0)
BKR WAM NEUTROPHILS: 85.1 % — ABNORMAL HIGH (ref 39.0–72.0)
BKR WAM NUCLEATED RED BLOOD CELLS: 0 % (ref 0.0–1.0)
BKR WAM PLATELETS: 412 x1000/ÂµL (ref 150–420)
BKR WAM RDW-CV: 16.2 % — ABNORMAL HIGH (ref 11.0–15.0)
BKR WAM RED BLOOD CELL COUNT.: 4.13 M/ÂµL (ref 4.00–6.00)
BKR WAM WHITE BLOOD CELL COUNT: 9.2 x1000/ÂµL (ref 4.0–11.0)

## 2023-09-03 LAB — LACTIC ACID, PLASMA (REFLEX 2H REPEAT): BKR LACTATE: 2.7 mmol/L — ABNORMAL HIGH (ref 0.4–2.0)

## 2023-09-03 LAB — TROPONIN T HIGH SENSITIVITY, 1 HOUR WITH REFLEX (BH GH LMW YH)
BKR TROPONIN T HS 1 HOUR DELTA FROM 0 HOUR: -9 ng/L
BKR TROPONIN T HS 1 HOUR: 105 ng/L — CR

## 2023-09-03 LAB — UA REFLEX CULTURE

## 2023-09-03 LAB — URINE MICROSCOPIC     (BH GH LMW YH)
BKR RBC/HPF, UA (INSTRUMENT): 276 /HPF — ABNORMAL HIGH (ref 0–2)
BKR WBC/HPF, UA (INSTRUMENT): 727 /HPF — ABNORMAL HIGH (ref 0–5)

## 2023-09-03 LAB — PHOSPHORUS     (BH GH L LMW YH): BKR PHOSPHORUS: 2.9 mg/dL (ref 2.2–4.5)

## 2023-09-03 LAB — REFLEX LACTIC ACID, PLASMA: BKR LACTATE: 0.8 mmol/L (ref 0.4–2.0)

## 2023-09-03 LAB — TROPONIN T HIGH SENSITIVITY, 0 HOUR BASELINE WITH REFLEX (BH GH LMW YH): BKR TROPONIN T HS 0 HOUR BASELINE: 114 ng/L — CR

## 2023-09-03 LAB — PROTIME AND INR
BKR INR: 1.07 (ref 0.86–1.13)
BKR PROTHROMBIN TIME: 11.7 s (ref 9.6–12.3)

## 2023-09-03 LAB — LIPASE: BKR LIPASE: 44 U/L (ref 11–55)

## 2023-09-03 LAB — MAGNESIUM: BKR MAGNESIUM: 2.2 mg/dL (ref 1.7–2.4)

## 2023-09-03 MED ORDER — MELATONIN 3 MG TABLET
3 | Freq: Every evening | ORAL | Status: CP | PRN
Start: 2023-09-03 — End: ?
  Administered 2023-09-06 – 2023-09-20 (×10): 3 mg via ORAL

## 2023-09-03 MED ORDER — SODIUM CHLORIDE 0.9 % (FLUSH) INJECTION SYRINGE
0.9 | Freq: Three times a day (TID) | INTRAVENOUS | Status: AC
Start: 2023-09-03 — End: ?
  Administered 2023-09-03 – 2023-09-21 (×36): 0.9 mL via INTRAVENOUS

## 2023-09-03 MED ORDER — ESCITALOPRAM 5 MG TABLET
5 | Freq: Every morning | ORAL | Status: SS
Start: 2023-09-03 — End: ?

## 2023-09-03 MED ORDER — TOBRAMYCIN TRADITIONAL DOSING IVPB
Freq: Once | INTRAVENOUS | Status: CP
Start: 2023-09-03 — End: ?
  Administered 2023-09-03: 13:00:00 100.000 mL/h via INTRAVENOUS

## 2023-09-03 MED ORDER — SODIUM CHLORIDE 0.9 % (FLUSH) INJECTION SYRINGE
0.9 | INTRAVENOUS | Status: AC | PRN
Start: 2023-09-03 — End: ?
  Administered 2023-09-06 – 2023-09-12 (×3): 0.9 mL via INTRAVENOUS

## 2023-09-03 MED ORDER — HYDROCODONE 5 MG-ACETAMINOPHEN 325 MG TABLET
5-325 | Freq: Four times a day (QID) | ORAL | 1 refills | Status: CP | PRN
Start: 2023-09-03 — End: ?
  Administered 2023-09-06 – 2023-09-09 (×3): 5-325 mg via ORAL

## 2023-09-03 MED ORDER — ZZ IMS TEMPLATE
Freq: Every day | ORAL | Status: CP
Start: 2023-09-03 — End: ?
  Administered 2023-09-04 – 2023-09-21 (×18): 10 mg via ORAL

## 2023-09-03 MED ORDER — IOHEXOL 350 MG IODINE/ML INTRAVENOUS SOLUTION
350 | Freq: Once | INTRAVENOUS | Status: CP | PRN
Start: 2023-09-03 — End: ?
  Administered 2023-09-03: 11:00:00 350 mL via INTRAVENOUS

## 2023-09-03 MED ORDER — HYDROCODONE 5 MG-ACETAMINOPHEN 325 MG TABLET
5-325 | Freq: Four times a day (QID) | ORAL | Status: SS | PRN
Start: 2023-09-03 — End: ?

## 2023-09-03 MED ORDER — POLYETHYLENE GLYCOL 3350 17 GRAM ORAL POWDER PACKET
17 | Freq: Two times a day (BID) | ORAL | Status: CP | PRN
Start: 2023-09-03 — End: ?
  Administered 2023-09-07 – 2023-09-12 (×3): 17 g via ORAL

## 2023-09-03 MED ORDER — TOBRAMYCIN THERAPY PLACEHOLDER
INTRAVENOUS | Status: DC
Start: 2023-09-03 — End: 2023-09-08

## 2023-09-03 MED ORDER — PANTOPRAZOLE IV PUSH 40 MG VIAL & NS (ADULTS)
Freq: Once | INTRAVENOUS | Status: CP
Start: 2023-09-03 — End: ?
  Administered 2023-09-03: 09:00:00 10.000 mL via INTRAVENOUS

## 2023-09-03 MED ORDER — ROSUVASTATIN 40 MG TABLET
40 | Freq: Every day | ORAL | Status: CP
Start: 2023-09-03 — End: ?
  Administered 2023-09-04 – 2023-09-21 (×17): 40 mg via ORAL

## 2023-09-03 MED ORDER — GABAPENTIN 100 MG CAPSULE
100 | Freq: Every evening | ORAL | Status: CP
Start: 2023-09-03 — End: ?
  Administered 2023-09-03 – 2023-09-20 (×18): 100 mg via ORAL

## 2023-09-03 MED ORDER — CLONAZEPAM 0.5 MG TABLET
0.5 | Freq: Every day | ORAL | Status: SS | PRN
Start: 2023-09-03 — End: ?

## 2023-09-03 MED ORDER — CLONAZEPAM 0.25 MG DISINTEGRATING TABLET
0.25 | Freq: Every day | ORAL | Status: CP | PRN
Start: 2023-09-03 — End: ?
  Administered 2023-09-06: 21:00:00 0.25 mg via ORAL

## 2023-09-03 MED ORDER — TOBRAMYCIN TRADITIONAL DOSING IVPB
INTRAVENOUS | Status: DC
Start: 2023-09-03 — End: 2023-09-03

## 2023-09-03 MED ORDER — SODIUM CHLORIDE 0.9 % LARGE VOLUME SYRINGE FOR AUTOINJECTOR
Freq: Once | INTRAVENOUS | Status: DC | PRN
Start: 2023-09-03 — End: 2023-09-03

## 2023-09-03 MED ORDER — SODIUM CHLORIDE 0.9 % LARGE VOLUME SYRINGE FOR AUTOINJECTOR
Freq: Once | INTRAVENOUS | Status: CP | PRN
Start: 2023-09-03 — End: ?
  Administered 2023-09-03: 11:00:00 via INTRAVENOUS

## 2023-09-03 MED ORDER — ASPIRIN 81 MG TABLET,DELAYED RELEASE
81 | Freq: Every day | ORAL | Status: CP
Start: 2023-09-03 — End: ?
  Administered 2023-09-10 – 2023-09-15 (×6): 81 mg via ORAL

## 2023-09-03 MED ORDER — CLOPIDOGREL 75 MG TABLET
75 | Freq: Every day | ORAL | Status: CP
Start: 2023-09-03 — End: ?
  Administered 2023-09-04 – 2023-09-21 (×12): 75 mg via ORAL

## 2023-09-03 MED ORDER — TOBRAMYCIN MAR LEVEL
Freq: Once | INTRAVENOUS | Status: DC
Start: 2023-09-03 — End: 2023-09-04

## 2023-09-03 MED ORDER — SODIUM BICARBONATE 650 MG TABLET
650 | Freq: Three times a day (TID) | ORAL | Status: CP
Start: 2023-09-03 — End: ?
  Administered 2023-09-03 – 2023-09-21 (×54): 650 mg via ORAL

## 2023-09-03 MED ORDER — ACETAMINOPHEN 325 MG TABLET
325 | Freq: Four times a day (QID) | ORAL | Status: CP | PRN
Start: 2023-09-03 — End: ?
  Administered 2023-09-06 – 2023-09-21 (×9): 325 mg via ORAL

## 2023-09-03 MED ORDER — APIXABAN 5 MG TABLET
5 | Freq: Two times a day (BID) | ORAL | Status: CP
Start: 2023-09-03 — End: ?
  Administered 2023-09-10 – 2023-09-21 (×23): 5 mg via ORAL

## 2023-09-03 MED ORDER — APIXABAN 5 MG TABLET
5 | Freq: Two times a day (BID) | ORAL | Status: CP
Start: 2023-09-03 — End: ?
  Administered 2023-09-03 – 2023-09-09 (×14): 5 mg via ORAL

## 2023-09-03 NOTE — Other
 Mark [name], Mark previously on hospice no longer wants to be on hospice however has waxing and waning cognition/Callback Cell Phone: covering provider/ Use .consultnotetemplate or add .consultrecommendation to your consult notes. Remind your attending to use .consultattestation /Please confirm receipt of this message by texting back ?OK?-4 - If an URGENT consult is needed after hours, weekend or holidays please contact the On Call provider listed in [place] or Smartweb. Otherwise the Mark will be seen normal business hours, on the next non-weekend, non-holiday day.-REMINDER: CONSULT CONNECT IS CLOSED 1900 TO 0700. PRIMARY TEAM MAY CONTACT YOU DIRECTLY ABOUT CONSULTS. [name]7/6/202511:15 [employer] [phone]

## 2023-09-03 NOTE — H&P
 Mark [name]  Hospitalist - medicine attending history and physicalHistory provided by: the patient and EMRHistory limited by:  Difficulty of hearingPatient presents from: HomeI saw and examined the patient on 09/03/2023 at  4:30 pm HISTORY Chief Complaint: Chest painHPI:  89 years old male with past medical history of HFpEF, CAD, BPH, status post chronic Foley's catheter, chronic kidney disease, bilateral hearing loss, GERD, hyperlipidemia, hypertension, abdominal aortic aneurysm, presented with chest painPer ED notesPatient on hospice. He told his daughter that this pain, she called their hospice nurse who recommended that he get nitroglycerin , patient got nitroglycerin  X3.  When EMS arrived patient found to be hypotensive with systolic blood pressure to 60, patient also had syncopal episode follow up by large watery bowel movement.  Patient given IV fluids with EMS and blood pressure improved to 107/58. Patient presents to [facility] awake, alert, oriented X 4, states he wants full interventions - IVF, blood products, chest compressions, and mechanical ventilation.  Patient states that he no longer wants to be on hospice and would prefer to have full interventions and medical problems addressed.  However patient's daughter states that his mentation waxes and wanes and sometimes patient will yell out I can't take it anymore and please let me die.  Patient's daughter is his primary medical healthcare proxy.  Corean Cooler is patient's secondary medical health care proxy. On my evaluationPatient chest pain has resolved, no dizziness.No nausea vomiting or abdominal painCollateral information from daughter [name] [phone] over the phone. She said that she is not able to take care of the patient anymore. He is on hospice through [place], but she doesn't want that anymore and she wants him to be enrolled with [facility] hospice. She said that his health has declined a lot within the last year. He sleeps a lot, doesn't eat, he is moaning with pain at home. ED Course:  Temperature 97.5?, pulse 69, BP 106/61, 99% on room air Sodium 136, potassium 5.1, creatinine 3.52, alkaline phosphatase 204, ALT 19, AST 53 High sensitivity troponin 114>105>113, lactate 2.7 trended down 2.8 WBC 9.1, hemoglobin 9.4, platelets 412, INR 1.07UA 276 RBC, 727 WBC, 4+ leukocytesEKG sinus rhythmChest x-ray no acute cardiothoracic abnormality CTA chestSmall segmental pulmonary embolus in a lingular pulmonary artery.Pulmonary nodules as described. If clinically indicated based on age, follow-up with chest [place] can be pursued in one year.Small sclerotic foci in the ribs may be sequelae of prior trauma, fibrous dysplasia although sclerotic lesions are not excluded. Correlate with PSA.[place] abdomen pelvis with IV contrast1. Stable moderate to severe hydroureteronephrosis.2. Decompressed, thick-walled appearance of the urinary bladder is unchanged and may represent chronic cystitis. Previously noted locules of gas within the bladder wall are not definitively seen on the current study.3. Ill-defined sclerotic lesion within the intertrochanteric region of the left femur is new in comparison to remote prior studies. In correlation with chest [place] findings demonstrating subtle sclerotic lesions within left-sided ribs, this is favored to represent a metastatic lesion, possibly in the setting of prostate cancer. Correlation with PSA is recommended. MRI can be considered for further evaluation as clinically warranted.4. Stable infrarenal abdominal aortic aneurysm measuring up to 5.2 cm.Eliquis  p.o. 10 mg, Protonix  IV 40 mg, tobramycin  IV 160 mgReview of Systems: the remainder of the ten-point review of systems has been reviewed and is negative. Medical history: per HPISurgical history:  Appendectomy, bladder surgery, cholecystectomy, fracture surgerySocial history: Former smoker, no drinking, no drug useFamily history:  Mother heart disease  Medications prior to admission (Not in a hospital admission)  Allergies Allergies[1]  VITAL SIGNSTemp:  [97.5 ?F (36.4 ?C)-97.8 ?F (36.6 ?C)] 97.8 ?F (36.6 ?C)Pulse:  [69-83] 78Resp:  [15-22] 18BP: (106-141)/(61-82) 141/72SpO2:  [94 %-100 %] 100 %Device (Oxygen Therapy): room air on WEIGHTS:Wt Readings from Last 5 Encounters: 09/03/23 82 kg 07/22/23 82 kg 06/10/23 81.6 kg 06/07/23 84.2 kg 05/29/23 82.7 kg  Intake and output: URINE OUTPUT/24 hoursINTAKENET  PHYSICAL EXAMGEN:  Not in distressHNT:  NormocephalicEYES:  AnictericCV:  Ejection systolic murmurPULM:  Good bilateral air entry with anterior auscultationABD:  SoftEXT:  No edemaMSK:  Moving 4 extremities antigravityNEURO:  Awake alert oriented x4 nonfocalVASC:  Not examinedPSYCH:  CooperativeDERM:  No rashesLABSRecent Labs Lab 07/06/250843 NA 136 K 5.1* - 5.1 CL 104 CO2 15* BUN 47* CREATININE 4.0* - 3.52* GLU 141* - 151* ANIONGAP 17  Recent Labs Lab 07/06/250843 CALCIUM  8.8 MG 2.2 PHOS 2.9  Recent Labs Lab 07/06/250843 WBC 9.2 HGB 9.4* HCT 31.0* - 30.50* PLT 412  Recent Labs Lab 07/06/250843 NEUTROPHILS 85.1*  Recent Labs Lab 07/06/250843 ALT 19 AST 53* ALKPHOS 204* BILITOT 0.5 PROT 8.0 ALBUMIN  3.9  Recent Labs Lab 07/06/250843 LABPROT 11.7 INR 1.07  No results for input(s): TROPONINT in the last 168 hours. Recent Labs Lab 07/06/250843 GLU 141* - 151*  DIAGNOSTICSCTA Chest (PE) w IV Contrast Preliminary Result   Small segmental pulmonary embolus in a lingular pulmonary artery.  Pulmonary nodules as described. If clinically indicated based on age, follow-up with chest [place] can be pursued in one year.  Small sclerotic foci in the ribs may be sequelae of prior trauma, fibrous dysplasia although sclerotic lesions are not excluded. Correlate with PSA.    [place] Radiology Notify System Classification: Urgent result.  Report initiated by:  Lynwood Maillard, MD  Reported and signed by: Cherilyn General, MD    [facility] Radiology and Biomedical Imaging   [place] Abdomen Pelvis with IV Contrast without oral (BMI>25) Final Result 1. Stable moderate to severe hydroureteronephrosis. 2. Decompressed, thick-walled appearance of the urinary bladder is unchanged and may represent chronic cystitis. Previously noted locules of gas within the bladder wall are not definitively seen on the current study. 3. Ill-defined sclerotic lesion within the intertrochanteric region of the left femur is new in comparison to remote prior studies. In correlation with chest [place] findings demonstrating subtle sclerotic lesions within left-sided ribs, this is favored to represent a metastatic lesion, possibly in the setting of prostate cancer. Correlation with PSA is recommended. MRI can be considered for further evaluation as clinically warranted. 4. Stable infrarenal abdominal aortic aneurysm measuring up to 5.2 cm.   Radiology Notify System Classification: Routine.  Reported and signed by: Camellia Mallory, MD    [facility] Radiology and Biomedical Imaging   CXR (portable) Final Result   No acute cardiothoracic abnormality.  [place] Radiology Notify System Classification: Routine.  Report initiated by:  Lynwood Maillard, MD  Reported and signed by: Camellia Mallory, MD    [facility] Radiology and Biomedical Imaging   I have personally seen and reviewed the above imaging. ASSESSMENT and PLAN 89 years old male with past medical history of HFpEF, CAD, BPH, status post chronic Foley's catheter, chronic kidney disease, bilateral hearing loss, GERD, hyperlipidemia, hypertension, abdominal aortic aneurysm, presented with chest pain, found to have small EZ:Dfjoo PE Elevated troponins PlanHe was started on Eliquis  by ED Chronic catheter associated UTIProteus vulgaris infectionPlanFollow up urine culture Tobramycin  (pharmacy to manage dosing and follow levels)CKD (stable Cr)PlanContinue sodium bicarbonate  p.o. 650 mg t.i.d.CAD, HLDPlanHold Aspirin  for now, since patient was started on  Eliquis  Continue Plavix  p.o. 75 mg dailyPalliative care was consulted, and they will see patient tomorrow Crestor  PO 40 mg dailyDiet: CardiacActivity: as tolerated DVT ppx: Eliquis  Code status:  Full code per patient's wishes Med rec: Completed, verified by pharmacy technicianSigned:Mohammad Al Mouslmani, MDAttending physicianAvailable on MHB and smartweb during night shift hoursDate: 7/6/2025Time: 4:45 PM [1] AllergiesAllergen Reactions  Ace Inhibitors Cough

## 2023-09-03 NOTE — ED Notes
 Assumed care of patient from 1240 - [phone] - Patient is alert and oriented x4. Calm and cooperative with care. Transferred to bed via stretcher via pull over. Resting comfortably in bed at this time with daughter at bedside.1250 - Ordered blood work collected. 1306 - Tobramycin  administered as ordered. 1524 - Spoke with patient's hospice service regarding plan of care. Informed of tentative palliative care meeting with patient and daughter tomorrow morning. 1900 - Patient transported to inpatient bed.

## 2023-09-03 NOTE — Care Coordination-Inpatient
 Call placed to [employer] she is in agreement with referrals being placed seeking inpatient bed at [facility] and  [facility], we await agency response.10:10 AM-[facility] will evaluate shortly, [facility] also will evaluate bedside at 1:30 today.10:30 AM- met with [name] bedside she was found to be tearful and unable to engage in full Care Management assessment at this time she does confirm she wants Hospice level of care and has been contacted by [facility].

## 2023-09-03 NOTE — Consults
 Aminoglycoside Initial AssessmentDosing Strategy: Aminoglycoside Assessment (Traditional Dosing)Does the patient have Cystic Fibrosis? noCurrent indication and aminoglycoside order: Non-Pseudomonal Gram-Negative Infection or Pseudomonal UTI Medication: TobramycinLoading dose 2 mg/kg IV x 1, followed by 1 mg/kg IV every per laboratory/placeholder data.Actual Body Weight: Wt Readings from Last 1 Encounters: 09/03/23 82 kg Ideal body weight: 75.3 kg (166 lb 0.1 oz)Adjusted ideal body weight: 78 kg (171 lb 14.6 oz)Scr: Creatinine (mg/dL) Date Value 92/93/7974 3.52 (H) 08/16/2023 3.60 (H) 07/31/2023 3.40 (H) POC Creatinine (mg/dL) Date Value 92/93/7974 4.0 (H) CrCl: Estimated Creatinine Clearance: 13 mL/min (A) (by C-G formula based on SCr of 3.52 mg/dL (H)).Renal Function: Stable Recommended monitoring: RandomRandom level scheduled on 7/7 at 1400. Repeat dose when level falls below 1 mcg/mL (gentamicin/tobramycin )Please contact pharmacist: Royce Minder, PharmD                  Phone/Mobile Heartbeat:  [facility]

## 2023-09-03 NOTE — Utilization Review (ED)
 Utilization Review from XsolisPatient Data:  Patient Name: Mark [name] Age: 89 y.o. DOB: December 06, 1925	 MRN: [id number]	 Indicated Status: Observation Review Comments: admitted with Syncope anginosa on hospice, Palliative care to see in the morning, IV fluids given for hypotension.

## 2023-09-03 NOTE — Plan of Care
 Problem: Adult Inpatient Plan of CareGoal: Readiness for Transition of CareOutcome: Initial problem identification Plan of Care Overview/ Patient StatusSyncope, AP and suspected GIB, BP 67/39 initially. 1200cc NS being given. On hospice care for ESRD.   Met with Daughter bedside she reports patient resides at home with daughter Beverley on first level of the home. He is currently active with [facility] providing RN for Hospice care. He has a rolling walker, wheelchair, and cane. He does not drive reports virtual visit with PCP within the past six months. Medical evaluation ongoing.Assessment screening completed. Continue to follow patient's progress and discuss plan of care with treatment team.  Discharge needs not determined at this time. Care Management will continue to follow with team.Case Management/Social Work Screening and Evaluation  Flowsheet Row Most Recent Value Case Management/Social Work Screening: Chart review completed. If YES to any question below then proceed to Eval/Plan  Is there a change in their cognitive function No Do you anticipate that the patient will have any discharge needs requiring CM/SW intervention? Yes Has there been an unscheduled readmission within the last 30 days and/or four (4) encounters (encounters include: ED, OBS, Inpatient) within the last six (6) months? No Were there services prior to admission ( Examples: Acute [facility],  Assisted Living, HD, Homecare, Extended Care Facility, Methadone, SNF, Outpatient Infusion Center) Yes Concern for current abuse/neglect/interpersonal violence/sexual assault  No Connected to state agency? No Guardianship or conservatorship No Negative/Positive Screen Positive Screening: Complete CM/SW Evaluation and Plan Case Manager/Social Work Patent attorney  I have reviewed the medical record and completed the above screen. CM/SW staff will follow patient's progress and discuss the plan of care with the Treatment Team. Yes Case Management/Social Work Evaluation and Plan  Arrived from prior to admission home/apartment/condo Do you have a caregiver, or do you anticipate the need for a caregiver given the change in your physical function? No Lives with Daughter, Adult Services Prior to Admission home care agency (specify services) Home care agency name: [facility] Agency Services:  RN visit Patient Requires Transition of Care Intervention Due To Change in physical function Prior to Hospitalization: Assistance Needed/DME being used Ambulation Ambulation Assistance/DME: Manual wheelchair, Straight cane, Rolling walker Documented Insurance Accurate Yes Any financial concerns related to anticipated discharge needs No Patient's home address verified Yes Patient's PCP of record verified Yes Last Date Seen by PCP 3-6 months  [virtual visit] Source of Clinical History  Patient's clinical history has been reviewed and source of Information is: Patient, Daughter Discharge Planning Coordination Recommendations  Discharge Planning Coordination Recommendations Other (Comment)  [Hospice vs. STR]  Abuse Screen  Is there anyone in your life that is hurting or threatening you in anyway? no Read to patient We know that many of our patients and caregivers are exposed to violence in the home so we have started letting everyone know that [place]  has a safe, confidential and free 24/7 hotline called [place] Safe Connect no education not provided Would it be ok if we add this resource to your DC paperwork? no Abuse Screen (yes response referral indicated)  Able to respond to abuse questions Yes Is there anyone in your life that is hurting or threatening you in anyway? no Physical Indicators of Abuse No evidence of physical abuse Read to patient We know that many of our patients and caregivers are exposed to violence in the home so we have started letting everyone know that [place]  has a safe, confidential and free 24/7 hotline  called [place] [facility] Connect no education not provided Would it be ok if we add this resource to your DC paperwork? no   CM has discussed the observation status with the patient , next of kin, POA or conservator. Cm explained the patient is not admitted to the hospital. For a patient with traditional Medicare Part A and Part B insurance, this hospital visit is being billed to their Medicare Part B as an outpatient visit with observation services. Medicare Part B does not provide coverage for a nursing home stay and observation days do not count toward a 3 night qualifying stay at a nursing home.  For a patient with commercial managed Medicare insurance, this hospital visit may have a co-pay which can be determined by   contacting the insurance company. For a patient with Medicaid insurance, there is no co-payThe expected time to complete this out-patient work-up is between 24-48 hours. Patient, family, patient representative verbalized understanding.

## 2023-09-03 NOTE — ED Notes
 11:26 AM   Floor Handoff Telemetry: 	[]  Yes		[x]  NoCode Status:   [x]  Full		[]  DNR		[]  DNI		Other (specify):Safety Precautions: [x]  Fall Risk  []  Sitter   []  Restraints	[]  Suicidal	[]  None	Other (specify):Mentation/Orientation:	 A&O (Self, person, place, time) x    4      	 Disoriented to:                    	 Special Accommodations: []  Hearing impaired   []  Blind  []  Nonverbal  []  Cognitive impairmentOxygenation Upon Admission: [x]  RA	[]  NC	[]  Venti  []  Simple Mask []  Other	Baseline O2 Status? [x]  Yes	[]  NoAmbulation: []  Independent	[]  Cane   []  Walker	[]  Wheelchair	[x]  Bedbound		[]  Hemiplegic	[]  Paraplegic	[]  QuadraplegicEliminiation: []  Independent	[]  Commode	[]  Bedpan/Urinal  []  Straight Cath [x]  Foley cath			[]  Urostomy	[]  Colostomy	Other (specify):Diarrhea/Loose stool : []  1x within 24h  []  2x within 24h  []  3x within 24h  [x]  None 	C.Diff Order: 	[]  Ordered- needs to be collected             []  Collected-sent to lab             []  Resulted - Negative C.Diff             []  Resulted - Positive C.Diff[x]  Not Ordered   []  N/ASkin Alteration: []  Pressure Injury []  Wound []  None [x]  Skin not assessedDiet: [x]  Regular/No order placed	[]  NPO		Other (specify):IV Access: [x]  PIV   []  PICC    []  Port    [] Central line    []  A-line    Other (specify)IVF/GTT Running Upon ED Departure? [x]  No	    []  Yes (specify):Outstanding Meds/Treatments/Tests:Meds Not Given: (as of time of note) 11:26 AM  		                                                 		Valuables Noted:  []  Dentures   []  Glasses  []  Hearing Aids []  None   Rosina Stai, RN	Nurse-Provider [facility] AdmissionsPrimary ED Provider: AllisonIs the Patient and Family Aware of Status: [x]  YesPlan of Care Discussed: [x]  Yes	Plan of Care Goals:Concern for Sepsis?     []  Yes	[x]  NoIf Yes, Interventions Initiated to Address Sepsis: []  Blood Cultures and Initial Lactate[]  Fluids[]  Antibiotics[]  Repeat Lactate

## 2023-09-03 NOTE — ED Notes
 8:03 AM 89y/o M BIBS with complaints of hypotension. Per EMS BP 67/39 initially, improved with 1500cc NS. EMS established bilateral 18g PIV. EMS reporting pt c/o SSCP x30 min, was given x3 nitro by hospice RN. 89 y.o. male comes to the ED w/ c/o Syncope (Syncope, AP and suspected GIB, BP 67/39 initially. 1200cc NS being given. On hospice care for ESRD.  Lives with family, not helpful with medical hx of pt.  SSCP x 1/2 hr also.  Belching a lot.  Stool is dark in color.  BP 107/58 after fluid.  GCS=14.   EKG ok.). Past Medical History[1]. has a past surgical history that includes Cholecystectomy; Appendectomy; Fracture surgery; and Bladder surgery (03/01/2019).. Pt's VS currently: Vitals:  09/03/23 0800 BP: 106/61 Pulse: 69 Resp: 16 Temp: 97.5 ?F (36.4 ?C) SpO2: 99% . 8:10 AM pt noted to be incontinent of feces, incontinence care provided, brief changed, linens changed. Pt in gown. Feces noted to be brown in color. 8:28 AM provider at bedside for eval. Pt on full cardiac monitor, respirations equal and unlabored. 8:42 AM additional PIV established, bloodwork obtained and sent to lab. 9:01 AM CXR at bedside. 9:14 AM medications administered per MAR. Incontinence care provided. Linens changed. Foley catheter changed maintaining sterile technique throughout. 9:50 AM daughter at bedside, noted to be extremely tearful.10:00 AM bloodwork obtained and sent to lab. 10:06 AM pt to imaging with transport staff.10:25 AM spoke with hospice nurse Dorothyann @[facility] at [phone] or Melissa at [phone]:30 AM pt back from imaging with transport staff. 10:35 AM pt back from imaging, placed on full cardiac monitor. 11:29 AM pt resting comfortably on stretcher, respirations equal and unlabored. [1] Past Medical History:Diagnosis Date  (HFpEF) heart failure with preserved ejection fraction (HC Code)    AAA (abdominal aortic aneurysm) (HC Code) 05/05/2015  Formatting of this note might be different from the original.  2016 3.6 cm US  in [place], 2018 4 cm, no further f/u recommended by Vasc    Allergic rhinitis 07/24/2019  Bilateral hearing loss 01/28/2019  BPH (benign prostatic hyperplasia)   CAD (coronary artery disease) 05/05/2015  Carotid artery stenosis 06/07/2016  Chronic coronary artery disease   Chronic sinusitis 07/24/2019  CKD (chronic kidney disease) stage 4, GFR 15-29 ml/min (HC Code)    Gastroesophageal reflux disease without esophagitis 05/05/2015  GERD (gastroesophageal reflux disease)   History of coronary artery stent placement 06/07/2016  Hyperlipidemia   Hypertension   Microcytic anemia   Mixed hyperlipidemia 07/24/2019  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

## 2023-09-03 NOTE — Other
 Admission Note Nursing Mark [name] is a 89 y.o. male admitted with a chief complaint of chest pain. Patient arrived from ED @1930Patient  is  A+Ox4 on RA. Pills whole. Chronic foley in place. 2 RN skin check completed. Pt denies chest pain and SOB at this time. Assist x1 in bed. Call bell within reach, safety measures maintained, bed alarm on.  Vitals:  09/03/23 1130 09/03/23 1539 09/03/23 1956 09/03/23 2237 BP: 135/64 (!) 141/72 (!) 157/76 (!) 148/69 Pulse: 78 78 77 81 Resp: 20 18 18 18  Temp:  97.8 ?F (36.6 ?C) 98.6 ?F (37 ?C) 97.3 ?F (36.3 ?C) TempSrc:  Axillary Oral Oral SpO2: 99% 100% 97% 98% Weight:     Height: 5' 11 (1.803 m)    Oxygen therapy Oxygen TherapySpO2: 98 %Device (Oxygen Therapy): room airI have reviewed the patient's current medication orders..No valuables with patient at this time . Pt states hearing aids and clothing were left in ED. ED B side was contacted about missing belongings, no update at this time. Belongings charted in last 7 days:  Comments:See flowsheets, patient education and plan of care for additional information.

## 2023-09-03 NOTE — Care Coordination-Inpatient
 Daughter Mark [name] has spoken to [facility], she understands patient is not currently [facility]. [name] of choice upon discharge home vs. Inpatient. [facility] will  continue to follow daily.

## 2023-09-04 LAB — CBC WITH AUTO DIFFERENTIAL
BKR WAM ABSOLUTE IMMATURE GRANULOCYTES.: 0.04 x 1000/ÂµL (ref 0.00–0.30)
BKR WAM ABSOLUTE LYMPHOCYTE COUNT.: 0.96 x 1000/ÂµL (ref 0.60–3.70)
BKR WAM ABSOLUTE NRBC: 0 x 1000/ÂµL (ref 0.00–1.00)
BKR WAM ANC (ABSOLUTE NEUTROPHIL COUNT): 6.39 x 1000/ÂµL (ref 2.00–7.60)
BKR WAM BASOPHIL ABSOLUTE COUNT.: 0.04 x 1000/ÂµL (ref 0.00–1.00)
BKR WAM BASOPHILS: 0.4 % (ref 0.0–1.4)
BKR WAM EOSINOPHIL ABSOLUTE COUNT.: 0.82 x 1000/ÂµL (ref 0.00–1.00)
BKR WAM EOSINOPHILS: 9.1 % — ABNORMAL HIGH (ref 0.0–5.0)
BKR WAM HEMATOCRIT: 25 % — ABNORMAL LOW (ref 38.50–50.00)
BKR WAM HEMOGLOBIN: 7.6 g/dL — ABNORMAL LOW (ref 13.2–17.1)
BKR WAM IMMATURE GRANULOCYTES: 0.4 % (ref 0.0–1.0)
BKR WAM LYMPHOCYTES: 10.7 % — ABNORMAL LOW (ref 17.0–50.0)
BKR WAM MCH: 22.7 pg — ABNORMAL LOW (ref 27.0–33.0)
BKR WAM MCHC: 30.4 g/dL — ABNORMAL LOW (ref 31.0–36.0)
BKR WAM MCV: 74.6 fL — ABNORMAL LOW (ref 80.0–100.0)
BKR WAM MONOCYTE ABSOLUTE COUNT.: 0.74 x 1000/ÂµL (ref 0.00–1.00)
BKR WAM MONOCYTES: 8.2 % (ref 4.0–12.0)
BKR WAM MPV: 9.6 fL (ref 8.0–12.0)
BKR WAM NEUTROPHILS: 71.2 % (ref 39.0–72.0)
BKR WAM NUCLEATED RED BLOOD CELLS: 0 % (ref 0.0–1.0)
BKR WAM PLATELETS: 311 x1000/ÂµL (ref 150–420)
BKR WAM RDW-CV: 16.2 % — ABNORMAL HIGH (ref 11.0–15.0)
BKR WAM RED BLOOD CELL COUNT.: 3.35 M/ÂµL — ABNORMAL LOW (ref 4.00–6.00)
BKR WAM WHITE BLOOD CELL COUNT: 9 x1000/ÂµL (ref 4.0–11.0)

## 2023-09-04 LAB — COMPREHENSIVE METABOLIC PANEL
BKR A/G RATIO: 0.9 — ABNORMAL LOW (ref 1.0–2.2)
BKR ALANINE AMINOTRANSFERASE (ALT): 16 U/L (ref 9–59)
BKR ALBUMIN: 3.1 g/dL — ABNORMAL LOW (ref 3.6–5.1)
BKR ALKALINE PHOSPHATASE: 173 U/L — ABNORMAL HIGH (ref 9–122)
BKR ANION GAP: 11 (ref 7–17)
BKR ASPARTATE AMINOTRANSFERASE (AST): 40 U/L — ABNORMAL HIGH (ref 10–35)
BKR AST/ALT RATIO: 2.5
BKR BILIRUBIN TOTAL: 0.3 mg/dL (ref <=1.2)
BKR BLOOD UREA NITROGEN: 45 mg/dL — ABNORMAL HIGH (ref 8–23)
BKR BUN / CREAT RATIO: 12.7 (ref 8.0–23.0)
BKR CALCIUM: 8.2 mg/dL — ABNORMAL LOW (ref 8.8–10.2)
BKR CHLORIDE: 106 mmol/L (ref 98–107)
BKR CO2: 18 mmol/L — ABNORMAL LOW (ref 20–30)
BKR CREATININE DELTA: -0.45
BKR CREATININE: 3.55 mg/dL — ABNORMAL HIGH (ref 0.40–1.30)
BKR EGFR, CREATININE (CKD-EPI 2021): 15 mL/min/1.73m2 — ABNORMAL LOW (ref >=60)
BKR GLOBULIN: 3.3 g/dL (ref 2.0–3.9)
BKR GLUCOSE: 104 mg/dL — ABNORMAL HIGH (ref 70–100)
BKR POTASSIUM: 4.5 mmol/L (ref 3.3–5.3)
BKR PROTEIN TOTAL: 6.4 g/dL (ref 5.9–8.3)
BKR SODIUM: 135 mmol/L — ABNORMAL LOW (ref 136–144)

## 2023-09-04 LAB — PHOSPHORUS     (BH GH L LMW YH): BKR PHOSPHORUS: 3.3 mg/dL (ref 2.2–4.5)

## 2023-09-04 LAB — MAGNESIUM: BKR MAGNESIUM: 2.1 mg/dL (ref 1.7–2.4)

## 2023-09-04 MED ORDER — TOBRAMYCIN MAR LEVEL
Freq: Once | INTRAVENOUS | Status: CP
Start: 2023-09-04 — End: ?

## 2023-09-04 NOTE — Progress Notes
 Pt disoriented and agitated this morning. Pt Alert to self only, doesn't follow commands. Pt attempted to get OOB multiple times, reorientation attempted unsuccessful. Bed alarm maintained. Pt verbally aggressive, therapeutic communication utilized.

## 2023-09-04 NOTE — ED Provider Notes
 Chief Complaint Patient presents with  Syncope   Syncope, AP and suspected GIB, BP 67/39 initially. 1200cc NS being given. On hospice care for ESRD.  Lives with family, not helpful with medical hx of pt.  SSCP x 1/2 hr also.  Belching a lot.  Stool is dark in color.  BP 107/58 after fluid.  GCS=14.   EKG ok. 89 year old male with history of CAD, AAA, orthostatic hypotension on midodrine , BPH with chronic foley, recent discharge from SNF (admitted for urosepsis 2/2 proteus), hypoactive delirium, presenting with episode of chest pain at home.  Patient told his daughter that this pain, she called their hospice nurse who recommended that he get nitroglycerin , patient got nitroglycerin  X3.  When EMS arrived patient found to be hypotensive with systolic blood pressure to 60, patient also had syncopal episode follow up by large watery bowel movement.  Patient given IV fluids with EMS and blood pressure improved to 107/58.Patient presents to [facility] awake, alert, oriented X 4, states he wants full interventions - IVF, blood products, chest compressions, and mechanical ventilation.  Patient states that he no longer wants to be on hospice and would prefer to have full interventions and medical problems addressed.  However patient's daughter states that his mentation waxes and wanes and sometimes patient will yell out I can't take it anymore and please let me die.  Patient's daughter is his primary medical healthcare proxy.  Corean Cooler is patient's secondary medical health care proxy. 8:18 AM Spoke with patient's daughter - this morning patient was nauseous was retching, started having chest pain, patient's daughter called the hospice nurse and the nurse told them to take 3 X nitroglycerin , patient asked daughter to call an ambulance. Daughter is tearful on the phone, seems to have caregiver fatigue.10:46 AM Patient's daughter at bedside, states she can no longer care for patient at home has his medical needs and ADL are too complex for her. She is tearful, having trouble making decisions on his care. States that he would not want to suffer and therefore states hospice is the best option for him. She has DNR/DNI paperwork at home however can not find it.10:58 AM Spoke with Palliative Care team, can place patient in medical observation and the palliative care team can see him in the moring#Hypotension 2/2 volume depletion vs. Orthostatic hypotension vs. Missed midodrine  dose vs. Excessive nitroglycerin  use vs. Sepsis (pneumonia, uti in chronic foley, viral infection, skin / soft tissue infection) vs. Cardiogenic shock- patient given IVF for hypotension- suspect volume depletion from GI losses + side effect from nitroglycerin #AKI on CKD 2/2 foley obstruction vs. UTI vs. Pre-renal- foley exchanged in the ED- patient given tobramycin  for UTI#Chest pain 2/2 Pulmonary embolism with elevated troponins11:43 AM Using the [facility] Pathway The patient received a Hestia score of 3 for outpatient pulmonary embolism (PE) treatment. The score is due to GIB or high risk of bleeding, medical or social reason for inpatient treatment, creatinine clearance less than 42mL/min^-1. The patient will require inpatient treatment, according to the Hestia criteria Therefore will start patient on apixaban . At this point patient and family want patient to be full code however they anticipate discussions with palliative care team tomorrow morning.Lorenda KANDICE Rake, MDHPI/PE:A 89 y/o male with a history of HFpEF, CAD, AAA, HTN, BPH with chronic Foley, orthostatic hypotension on midodrine , and CKD on hospice presented with syncope according to the EMS, which was treated with 1200 cc N/S in hospice due to BP of 67/39 mmHg, but he denied LOC  and stated he's here due to chest pain and SOB since 1 hour ago. He endorses new abdominal pain.MDM:A 89 y/o male with a history of HFpEF, CAD, AAA, HTN, BPH with chronic Foley, orthostatic hypotension on midodrine , and CKD on hospice presented with syncope and low BP treated with N/S, now BP of 137/65, concerned of chest pain and SOB since 1 hour ago now. The patient is on hospice. Stool was not black.  Physical ExamED Triage Vitals [09/03/23 0800]BP: 106/61Pulse: 69Pulse from  O2 sat: n/aResp: 16Temp: 97.5 ?F (36.4 ?C)Temp src: n/aSpO2: 99 % BP (!) 148/69  - Pulse 81  - Temp 97.3 ?F (36.3 ?C) (Oral)  - Resp 18  - Ht 5' 11 (1.803 m)  - Wt 82 kg  - SpO2 98%  - BMI 25.21 kg/m? Physical ExamExam conducted with a chaperone present. Constitutional:     General: He is not in acute distress.   Appearance: He is ill-appearing.    Comments: pale Cardiovascular:    Rate and Rhythm: Normal rate and regular rhythm. Pulmonary:    Effort: Pulmonary effort is normal.    Breath sounds: Normal breath sounds. Abdominal:    General: Abdomen is flat.    Palpations: Abdomen is soft. Genitourinary:   Comments: Brown stoolMusculoskeletal:       General: No swelling. Neurological:    Mental Status: He is alert.  ProceduresAttestation/Critical CarePatient Reevaluation: Attending Supervised: ResidentI saw and examined the patient. I agree with the findings and plan of care as documented in the resident's note.89 y/o M with MMP as above presents to the ER for chest pain. He states that his chest pain started roughly 30 minutes prior to arrival.  He notes associated shortness of breath that started at the same time.  He also notes abdominal pain which he states has been ongoing for quite some time and is intermittent.  He states that he has vomited and had diarrhea but states he did not look at it to provide history regarding the quality or collar of it.  He is unsure if he is on blood thinners but based on chart review it appears he has not.  He reportedly also had an episode of syncope on the gurney for EMS.  No radiation of his pain.  No diaphoresis.  On exam he appears uncomfortable but nontoxic.  At the time of my evaluation he is hemodynamically stable and afebrile.  He was hypotensive for EMS in the field, but this was reportedly after taking 3 nitroglycerin  that he was told to take by the hospice nurse.  On exam he is warm and well-perfused.  His abdomen is diffusely tender to palpation.  He had stool that was being cleaned at the time of my evaluation in his undergarments that appeared brown in color and did not appear melanotic.  No lower extremity edema.  I do not note a rash.  His neck is supple.  Initial EKG does not show any overt evidence of ischemia.  Based on the above, a broad workup has been undertaken for cardiac and pulmonary causes of his symptoms with troponin's and [place] PE.  We have also ordered a [place] of his abdomen for the abdominal pain he is endorsing.  We have ordered blood cultures as well as an infectious workup for his symptoms. At this time, despite being a hospice patient with the patient who is alert and oriented he is able to tell us  that he wants all interventions performed.  The resident did have  a conversation with his wife who states that he has a fluctuating mental status and we will see this sometimes but other times does not wish for this to be the case.  A family member is on the way and in case anything happens in the meantime he will be full code however we will reassess once a family member arrives.Luca Burston MDEmergency MedicineContact available on [facility] Comments as of 09/04/23 [id number] [place] Sep 03, 2023 1134 The resident consulted palliative Care who recommended admission to medicine observation so they could see him in the morning.  At this time, we will proceed as the patient  full code. He was noted to have a PE, so we have ordered a DOAC. Also possible UTI for which we have ordered abx. Foley replaced.  [MB]  Comments User Index[MB] Jasmine Merilee Rigg, MD   Clinical Impressions as of 09/04/23 [phone] Syncope, unspecified syncope type Admission for palliative care  ED DispositionAdmit  Aminorroaya, Arya, MDResident07/06/25 0844 Greidys Deland Stuart, MD07/06/25 0859 Isaiah Lorenda Fellows, MDResident07/06/25 1059 Isaiah Lorenda Fellows, MDResident07/06/25 1145 Isaiah Lorenda Fellows, MDResident07/06/25 1556 Jasmine Merilee Rigg, MD07/07/25 [id number]

## 2023-09-04 NOTE — Other
 GIP Hospice ConsultCase discussed with patient, family, and CM.I met with patient and daughter [name], and Arthea. Hospice philosophy and services at the inpatient level of care, routine level of care at home or SNF and respite care was discussed.The Patient qualifies for GIP: No: Routine hospice care can be provided at home or SNFPlan: spoke with care management to identify other locations for hospice care outside of the hospital.Please contact [facility] at [phone] for any questions or change in patient's conditions. Electronically signedEricka Germaine, RN 09/04/2023 3:21 PM

## 2023-09-04 NOTE — Utilization Review (ED)
 Utilization Review from XsolisPatient Data:  Patient Name: Mark [name] Age: 89 y.o. DOB: 12-14-1925	 MRN: [id number]	 Indicated Status: Observation Review Comments: chest pain, patient got nitroglycerin  X3, PE found, started on Eliquis . Currently on Hospice.

## 2023-09-04 NOTE — Plan of Care
 Plan of Care Overview/ Patient StatusProblem: Adult Inpatient Plan of CareGoal: Plan of Care ReviewOutcome: Interventions implemented as appropriateGoal: Patient-Specific Goal (Individualized)Outcome: Interventions implemented as appropriateGoal: Absence of Hospital-Acquired Illness or InjuryOutcome: Interventions implemented as appropriateGoal: Optimal Comfort and WellbeingOutcome: Interventions implemented as appropriateGoal: Readiness for Transition of CareOutcome: Interventions implemented as appropriate Problem: WoundGoal: Optimal CopingOutcome: Interventions implemented as appropriateGoal: Optimal Functional AbilityOutcome: Interventions implemented as appropriateGoal: Absence of Infection Signs and SymptomsOutcome: Interventions implemented as appropriateGoal: Improved Oral IntakeOutcome: Interventions implemented as appropriateGoal: Optimal Pain Control and FunctionOutcome: Interventions implemented as appropriateGoal: Skin Health and IntegrityOutcome: Interventions implemented as appropriateGoal: Optimal Wound HealingOutcome: Interventions implemented as appropriate Problem: Skin Injury Risk IncreasedGoal: Skin Health and IntegrityOutcome: Interventions implemented as appropriate Problem: Fall Injury RiskGoal: Absence of Fall and Fall-Related InjuryOutcome: Interventions implemented as appropriate

## 2023-09-04 NOTE — Progress Notes
 [facility] Progress NoteHospital Medicine ServiceAttending Provider: Maude EMERSON Macintosh, MD  Location:  8830/8830-CHospital Day #0Subjective   Patient seen and examined on 09/04/2023.CC: Syncope anginosa (HC Code)Interim History: Pt disoriented and aggressive overnight. Pt today A&Ox3. Pt endorses mild dyspnea, abdmominal pain, N/V/D. Pt denies chest pain, fevers, chills.  Objective  Vitals:Temp:  [97.3 ?F (36.3 ?C)-98.6 ?F (37 ?C)] 97.8 ?F (36.6 ?C)Pulse:  [68-81] 68Resp:  [18-20] 18BP: (131-157)/(53-76) 131/53SpO2:  [94 %-100 %] 94 %Device (Oxygen Therapy): room airI/O's:Intake/Output Summary (Last 24 hours) at 09/04/2023 1116Last data filed at 09/04/2023 0956Gross per 24 hour Intake -- Output 1100 ml Net -1100 ml  Physical ExamConstitutional:     Appearance: Normal appearance. HENT:    Head: Normocephalic and atraumatic. Cardiovascular:    Rate and Rhythm: Normal rate and regular rhythm.    Pulses: Normal pulses.    Heart sounds: Normal heart sounds. Pulmonary:    Effort: Pulmonary effort is normal.    Breath sounds: Normal breath sounds. Abdominal:    General: Abdomen is flat.    Palpations: Abdomen is soft.    Tenderness: There is no abdominal tenderness. Skin:   General: Skin is warm and dry.    Capillary Refill: Capillary refill takes less than 2 seconds. Neurological:    General: No focal deficit present.    Mental Status: He is alert and oriented to person, place, and time. Psychiatric:       Mood and Affect: Mood normal.       Behavior: Behavior normal.  Labs:I have reviewed the patient's pertinent labs as resulted in the EMR.Peripheral Access:Periph IV 09/03/23 0931 median cubital(antecubital fossa), right over-the-needle catheter system 18 gauge Nurse (Active)   Periph IV 09/03/23 cephalic (thumb side), right 18 gauge Paramedic (Active) Periph IV 09/03/23 cephalic (thumb side), left over-the-needle catheter system 18 gauge Paramedic (Active)  Foley Catheter:UreTHral Catheter 09/03/23 0929 100% silicone 16 Registered Nurse 10 10 (Active) Foley order placed.Imaging:No results found. I reviewed test results in formulating this patient's plan of care.   Assessment: 89 y.o. male PMHx HFpEF, CAD, HLD, HTN, abdominal aortic aneurysm, BPH s/p chronic foley cath, CKD 4-5, b/l hearing loss, GERD. Presented to ED from home w/ chest pain, found to have small PE.  Plan: #Small PE- Pt endorses mild dyspnea, satting well on RA- Seen on CTA 7/6- Elevated trops- C/w eliquis #Delirium- Today A&Ox3, though waxing and waning mentation- Delirium precautions#Chronic cath asso UTI- Proteus vulgaris infection#Chronic anemia- H&H stable- Will continue to monitor CBC daily#Hospice inpatient vs home- Patient states that he no longer wants to be on hospice and would prefer to have full interventions and medical problems addressed.  However patient's daughter states that his mentation waxes and wanes and sometimes patient will yell out I can't take it anymore and please let me die.  Patient's daughter is his primary medical healthcare proxy.  Corean Cooler is patient's secondary medical health care proxy. Patient's daughter states she can no longer care for patient at home has his medical needs and ADLs are too complex for her. - Care management discussed with daughters, provided resources #CKD stage 4-5- Cr stable  Comorbidities Comorbidities present on admission:Chronic Heart Failure with preserved ejection fraction, last EF: 65% 05/10/2023.   PMH of COVID  Secondary diagnoses occurring during hospitalization:HypocalcemiaHyponatremia Diet: Diet CardiacVTE PPx:  apixaban  (ELIQUIS ) tablet 10 mgapixaban (ELIQUIS ) tablet 5 mg Medication Reconciliation complete: Partially Communication: Family: [name], RN, and ConsultantsDischarge Readiness:   AM-PAC (RN/PT): 13 /     Expected discharge location:  UndeterminedBarrier(s) to discharge: Dispo pending Full CodeMedical decision making for this patient was high.Signed:Shelba Rockers, PA7/7/202511:16 AM

## 2023-09-05 LAB — CBC WITHOUT DIFFERENTIAL
BKR WAM ANC (ABSOLUTE NEUTROPHIL COUNT): 6.18 x 1000/ÂµL (ref 2.00–7.60)
BKR WAM HEMATOCRIT: 25 % — ABNORMAL LOW (ref 38.50–50.00)
BKR WAM HEMOGLOBIN: 7.6 g/dL — ABNORMAL LOW (ref 13.2–17.1)
BKR WAM MCH: 22.8 pg — ABNORMAL LOW (ref 27.0–33.0)
BKR WAM MCHC: 30.4 g/dL — ABNORMAL LOW (ref 31.0–36.0)
BKR WAM MCV: 74.9 fL — ABNORMAL LOW (ref 80.0–100.0)
BKR WAM MPV: 9.4 fL (ref 8.0–12.0)
BKR WAM PLATELETS: 324 x1000/ÂµL (ref 150–420)
BKR WAM RDW-CV: 16.2 % — ABNORMAL HIGH (ref 11.0–15.0)
BKR WAM RED BLOOD CELL COUNT.: 3.34 M/ÂµL — ABNORMAL LOW (ref 4.00–6.00)
BKR WAM WHITE BLOOD CELL COUNT: 9.1 x1000/ÂµL (ref 4.0–11.0)

## 2023-09-05 LAB — BASIC METABOLIC PANEL
BKR ANION GAP: 13 (ref 7–17)
BKR BLOOD UREA NITROGEN: 44 mg/dL — ABNORMAL HIGH (ref 8–23)
BKR BUN / CREAT RATIO: 11.9 (ref 8.0–23.0)
BKR CALCIUM: 8.4 mg/dL — ABNORMAL LOW (ref 8.8–10.2)
BKR CHLORIDE: 107 mmol/L (ref 98–107)
BKR CO2: 18 mmol/L — ABNORMAL LOW (ref 20–30)
BKR CREATININE DELTA: 0.16
BKR CREATININE: 3.71 mg/dL — ABNORMAL HIGH (ref 0.40–1.30)
BKR EGFR, CREATININE (CKD-EPI 2021): 14 mL/min/1.73m2 — ABNORMAL LOW (ref >=60)
BKR GLUCOSE: 119 mg/dL — ABNORMAL HIGH (ref 70–100)
BKR POTASSIUM: 4.7 mmol/L (ref 3.3–5.3)
BKR SODIUM: 138 mmol/L (ref 136–144)

## 2023-09-05 LAB — URINE CULTURE

## 2023-09-05 LAB — TOBRAMYCIN LEVEL, RANDOM     (BH GH L LMW YH): BKR TOBRAMYCIN RANDOM: 2.1 ug/mL

## 2023-09-05 MED ORDER — TOBRAMYCIN MAR LEVEL
Freq: Once | INTRAVENOUS | Status: CP
Start: 2023-09-05 — End: ?

## 2023-09-05 NOTE — Other
 PHARMACY-ASSISTED MEDICATION REPORTPharmacist review of the best possible medication history obtained by the pharmacy medication history technician has been performed.  I have updated the home medication list and identified the following information that may be relevant to this admission.NOTES/RECOMMENDATIONSNone at this time        Prior to Admission Medications Medication Name Sig Taking? Patient Reported   acetaminophen  (TYLENOL ) 325 mg tabletLast dose:  --  Take 2 tablets (650 mg total) by mouth every 6 (six) hours as needed. Yes     aspirin  81 mg EC delayed release tabletLast dose:  --  Take 1 tablet (81 mg total) by mouth daily. Yes     atorvastatin  (LIPITOR) 80 mg tabletLast dose:  --  Take 1 tablet (80 mg total) by mouth daily. Yes     b complex vitamins (B COMPLEX) tabletLast dose:  --  Take 1 tablet by mouth daily. Yes Yes   camphor-menthoL  (SARNA) 0.5-0.5 % lotionLast dose:  --  Apply topically 2 (two) times daily as needed for itching. Yes     clonazePAM  (KLONOPIN ) 0.5 mg tabletLast dose:  -- Last Medication Note: >> RETIA GENI RAMAN   Sun Sep 03, 2023 12:02 PMMedHX Tech(Erica RAMAN RETIA, CPHT):  Daughter reports half tablet daily prnEntered by Staggers, Erica S, CPHT Sun Sep 03, 2023 1202 Take 1 tablet (0.5 mg total) by mouth daily as needed. Yes Yes   clopidogreL  (PLAVIX ) 75 mg tabletLast dose:  --  Take 1 tablet (75 mg total) by mouth daily. Yes     escitalopram  oxalate (LEXAPRO ) 10 mg tabletLast dose:  --  Take 1 tablet (10 mg total) by mouth daily. Yes     escitalopram  oxalate (LEXAPRO ) 5 mg tabletLast dose:  --  Take 1 tablet (5 mg total) by mouth every morning. Take in combination with 10 mg tab for total dose of 15 mg daily Yes Yes   gabapentin  (NEURONTIN ) 100 mg capsuleLast dose:  --  Take 2 capsules (200 mg total) by mouth at bedtime. Yes     HYDROcodone -acetaminophen  (NORCO) 5-325 mg per tabletLast dose:  -- Take 1 tablet by mouth every 6 (six) hours as needed for pain. Yes Yes   nitroGLYCERIN  (NITROSTAT ) 0.4 mg SL tabletLast dose:  --  Place 1 tablet (0.4 mg total) under the tongue as needed for chest pain.   Yes   polyethylene glycol (GLYCOLAX ; MIRALAX ) 17 gram/dose powderLast dose:  --  Take 17 g by mouth daily as needed for constipation. Yes Yes   senna (SENOKOT) 8.6 mg tabletLast dose:  --  Take 2 tablets (17.2 mg total) by mouth 2 (two) times daily. Yes     sodium bicarbonate  650 mg tabletLast dose:  --  Take 1 tablet (650 mg total) by mouth 3 (three) times daily. Yes     Prior to admission medications last reviewed by Babara Salines, PharmD on Tue Sep 05, 2023 1532 Thank rosine Salines Babara, PharmD7/8/20253:32 PMPhone: [facility]

## 2023-09-05 NOTE — Progress Notes
 [facility] Progress NoteHospital Medicine ServiceAttending Provider: Maude EMERSON Macintosh, MD  Location:  8830/8830-CHospital Day #0Subjective   Patient seen and examined on 09/05/2023.CC: Syncope anginosa (HC Code)Interim History: Pt overnight no events. Pt today sleepy but arousable. Pt did not participate in conversation, likely due to delirium/encephalopathy. Did not appear uncomfortable during physical exam.  Objective  Vitals:Temp:  [98.1 ?F (36.7 ?C)-98.2 ?F (36.8 ?C)] 98.1 ?F (36.7 ?C)Pulse:  [81-97] 81Resp:  [18] 18BP: (157-167)/(68-75) 159/72SpO2:  [94 %-100 %] 94 %Device (Oxygen Therapy): room airI/O's:Intake/Output Summary (Last 24 hours) at 09/05/2023 0816Last data filed at 09/05/2023 0535Gross per 24 hour Intake -- Output 1400 ml Net -1400 ml  Physical ExamConstitutional:     Appearance: Normal appearance. HENT:    Head: Normocephalic and atraumatic. Cardiovascular:    Rate and Rhythm: Normal rate and regular rhythm.    Pulses: Normal pulses.    Heart sounds: Normal heart sounds. Pulmonary:    Effort: Pulmonary effort is normal.    Breath sounds: Normal breath sounds. Abdominal:    General: Abdomen is flat.    Palpations: Abdomen is soft.    Tenderness: There is no abdominal tenderness. Skin:   General: Skin is warm and dry.    Capillary Refill: Capillary refill takes less than 2 seconds. Neurological:    General: No focal deficit present.    Mental Status: He is alert and oriented to person, place, and time. Psychiatric:       Mood and Affect: Mood normal.       Behavior: Behavior normal.  Labs:I have reviewed the patient's pertinent labs as resulted in the EMR.Peripheral Access:Periph IV 09/03/23 cephalic (thumb side), left over-the-needle catheter system 18 gauge Paramedic (Active)  Foley Catheter:UreTHral Catheter 09/03/23 0929 100% silicone 16 Registered Nurse 10 10 (Active) Foley order placed.Imaging:No results found. I reviewed test results in formulating this patient's plan of care.   Assessment: 89 y.o. male PMHx HFpEF, CAD, HLD, HTN, abdominal aortic aneurysm, BPH s/p chronic foley cath, CKD 4-5, b/l hearing loss, GERD. Presented to ED from home w/ chest pain, found to have small PE.   Plan: #Hospice inpatient vs home- Patient states that he no longer wants to be on hospice and would prefer to have full interventions and medical problems addressed.  However patient's daughter states that his mentation waxes and wanes and sometimes patient will yell out I can't take it anymore and please let me die. Patient's daughter is his primary medical healthcare proxy.  Corean Cooler is patient's secondary medical health care proxy. Patient's daughter states she can no longer care for patient at home has his medical needs and ADLs are too complex for her. - Care management discussed with daughters on 7/7, provided resources for [employer]#Chronic cath asso UTI- Proteus vulgaris infection- received dose of Tobra on 7/6, which will last 3-5 days- Random tobra level 2.1- Pharm will recheck tobra tomorrow AM- Will redose tobra 7/9 or 7/10 or might change to cipro  oral for OP management#Small PE- Pt endorses mild dyspnea, satting well on RA- Seen on CTA 7/6- Elevated trops- C/w eliquis #Delirium- Pt sleepy but arousable. Unable to gauge orientation.- Delirium precautions#Chronic anemia- H&H stable- Will continue to monitor CBC daily#CKD stage 4-5- Cr stable  Comorbidities Comorbidities present on admission:Chronic Heart Failure with preserved ejection fraction, last EF: 65% 05/10/2023.   PMH of COVID  Secondary diagnoses occurring during hospitalization:HypocalcemiaHyponatremia Diet: Diet CardiacVTE PPx: apixaban  (ELIQUIS ) tablet 10 mgapixaban (ELIQUIS ) tablet 5 mg  Medication Reconciliation complete: Partially Communication:  Family: [name], RN, and ConsultantsDischarge Readiness: Expected Discharge Date: 09/06/23 AM-PAC (RN/PT): 13 /     Expected discharge location: UndeterminedBarrier(s) to discharge: Dispo pending Full CodeMedical decision making for this patient was high.Signed:Shelba Rockers, PA7/8/20258:16 AM

## 2023-09-05 NOTE — Utilization Review (ED)
 Utilization Review from XsolisPatient Data:  Patient Name: Mark [name] Age: 89 y.o. DOB: [birth year]	 MRN: [id number]	 Indicated Status: Observation Review Comments: chest pain, found to have small PE., eliquis , pending placement inpatient hospice

## 2023-09-05 NOTE — Utilization Review (ED)
 Utilization Review from XsolisPatient Data:  Patient Name: Mark [name] Age: 89 y.o. DOB: [birth year]	 MRN: [id number]	 Indicated Status: Inpatient Review Comments: Chest pain presentation, chronic catheter associated UTI. Nicholson with noted +PE.   Proteus vulgaris infection.  Tobramycin  IV, continued.  Placed on Eliquis .  Discussed with covering provider, RONAL Rockers, PA, agrees with Inpatient status.

## 2023-09-05 NOTE — Consults
 Aminoglycoside Level EvaluationDosing Strategy: Aminoglycoside Traditional Level EvaluationCurrent Aminoglycoside Order: Tobramycin  2 mg/kg and dose per level given crcl < 20 ml/minScr: Creatinine (mg/dL) Date Value 92/91/7974 3.71 (H) 09/04/2023 3.55 (H) 09/03/2023 3.52 (H) POC Creatinine (mg/dL) Date Value 92/93/7974 4.0 (H) CrCl: 12 ml/minRenal Function: Stable, CKD 4-5 at baselineIndication: Empiric/Culture documented Non-Pseudomonal Gram-Negative Infection or Pseudomonal UTIMonitoring: Random: Lab Results Last 72 Hours Component Value Date/Time  Tobramycin  Random 2.1 09/05/2023 06:02 AM  (41 hours after the dose)Was level drawn appropriately? Yes Based on Aminoglycoside level obtained, recommend: Continue to hold tobramycin  and redose when random level < 1Repeat level: pending discussion with team pending duration of therapy given we may be able to get away with 3 day treatment course for aminoglycosides for cystitis. Additionally, expect patient to still have adequate levels likely for another 24 hoursFor questions, please contact the pharmacist: Camelia Cap, PharmD           Phone/Mobile Heartbeat:  [facility]                  Addendum: per discussion with team, they would like to aim for 7-10 days treatment course. Will check random tobramycin  level with am labs tmrw

## 2023-09-05 NOTE — Plan of Care
 Problem: Adult Inpatient Plan of CareGoal: Plan of Care ReviewOutcome: Interventions implemented as appropriate Plan of Care Overview/ Patient StatusPt calm and cooperative. Pt tried to get out of bed but was redirectable. On RA, chronic foley with red tinged urine. Provider notified. Pt HOH, swallowed pills whole. Foley care done, cleaned blood clots from tip of urethra. No discomfort noted. Denies pain. Bed linen and gown changed. Call light within reach and bed alarm on.

## 2023-09-05 NOTE — Plan of Care
 Plan of Care Overview/ Patient Status0700-1900Pt is AAOx2 to self and place. On RA with no evidence of SOB. Lethargic today but wakes up to touch. HOH. Chronic foley in place with red tinged urine. Blood clots noted with flushing. No c/o pain. Eating 75% of meals with encouragement. Ax1 in bed with turning and repositioning done. Call bell in reach. Safety plan maintained. See flowsheet for further details.

## 2023-09-05 NOTE — Plan of Care
 Problem: Adult Inpatient Plan of CareGoal: Readiness for Transition of CareOutcome: Interventions implemented as appropriate Plan of Care Overview/ Patient StatusPer provider Ngo, patient no longer wants to be on hospice, Dtr no longer able to care for patient at home due to his complex needs and she is overwhelmed. Pt flipped from OBS to inpatient status today, will need 3 MN stays/Pasrr/auth and PT/OT eval for SNF placement. SNF referral need sent to d/c needs checklist for initiation of SNF search.Case Management will take lead to arrange post -acute care services and continue to work with the care team as the patient progresses towards discharge.Shona Skates RN, Engineer, building services phone # [phone] Heart Beat # [phone]

## 2023-09-06 LAB — BASIC METABOLIC PANEL
BKR ANION GAP: 12 (ref 7–17)
BKR BLOOD UREA NITROGEN: 41 mg/dL — ABNORMAL HIGH (ref 8–23)
BKR BUN / CREAT RATIO: 11.3 (ref 8.0–23.0)
BKR CALCIUM: 8.4 mg/dL — ABNORMAL LOW (ref 8.8–10.2)
BKR CHLORIDE: 103 mmol/L (ref 98–107)
BKR CO2: 21 mmol/L (ref 20–30)
BKR CREATININE DELTA: -0.07
BKR CREATININE: 3.64 mg/dL — ABNORMAL HIGH (ref 0.40–1.30)
BKR EGFR, CREATININE (CKD-EPI 2021): 15 mL/min/1.73m2 — ABNORMAL LOW (ref >=60)
BKR GLUCOSE: 120 mg/dL — ABNORMAL HIGH (ref 70–100)
BKR POTASSIUM: 4.7 mmol/L (ref 3.3–5.3)
BKR SODIUM: 136 mmol/L (ref 136–144)

## 2023-09-06 LAB — CBC WITHOUT DIFFERENTIAL
BKR WAM ANC (ABSOLUTE NEUTROPHIL COUNT): 7.55 x 1000/ÂµL (ref 2.00–7.60)
BKR WAM HEMATOCRIT: 25.7 % — ABNORMAL LOW (ref 38.50–50.00)
BKR WAM HEMOGLOBIN: 7.9 g/dL — ABNORMAL LOW (ref 13.2–17.1)
BKR WAM MCH: 22.9 pg — ABNORMAL LOW (ref 27.0–33.0)
BKR WAM MCHC: 30.7 g/dL — ABNORMAL LOW (ref 31.0–36.0)
BKR WAM MCV: 74.5 fL — ABNORMAL LOW (ref 80.0–100.0)
BKR WAM MPV: 10 fL (ref 8.0–12.0)
BKR WAM PLATELETS: 367 x1000/ÂµL (ref 150–420)
BKR WAM RDW-CV: 16.1 % — ABNORMAL HIGH (ref 11.0–15.0)
BKR WAM RED BLOOD CELL COUNT.: 3.45 M/ÂµL — ABNORMAL LOW (ref 4.00–6.00)
BKR WAM WHITE BLOOD CELL COUNT: 10.3 x1000/ÂµL (ref 4.0–11.0)

## 2023-09-06 LAB — PSA, TOTAL (SCREENING) (BH GH L LMW YH): BKR PROSTATE SPECIFIC ANTIGEN, SCREENING (ROCHE): 140 ng/mL — ABNORMAL HIGH (ref <=7.200)

## 2023-09-06 LAB — TOBRAMYCIN LEVEL, RANDOM     (BH GH L LMW YH): BKR TOBRAMYCIN RANDOM: 1.5 ug/mL

## 2023-09-06 MED ORDER — LIDOCAINE 4 % TOPICAL PATCH
4 | TRANSDERMAL | Status: CP
Start: 2023-09-06 — End: ?

## 2023-09-06 MED ORDER — TOBRAMYCIN MAR LEVEL
Freq: Once | INTRAVENOUS | Status: CP
Start: 2023-09-06 — End: ?

## 2023-09-06 NOTE — Consults
 Aminoglycoside Level EvaluationDosing Strategy: Aminoglycoside Traditional Level EvaluationCurrent Aminoglycoside Order: 160 mg x 1, then dose per levelDay of therapy: 4. Team opting for 7 day abx course. Can redose tobramycin  pending level vs could dc on ciprofloxacin  given pansensitive pseudomonas in the urine cxScr: Creatinine (mg/dL) Date Value 92/90/7974 3.64 (H) 09/05/2023 3.71 (H) 09/04/2023 3.55 (H) CrCl: 13 ml/minRenal Function: CKD 4-5 at baselineIndication: Empiric/Culture documented Non-Pseudomonal Gram-Negative Infection or Pseudomonal UTIMonitoring: Random: Lab Results Last 72 Hours Component Value Date/Time  Tobramycin  Random 1.5 09/06/2023 05:51 AM  Tobramycin  Random 2.1 09/05/2023 06:02 AM  (65 hours after the dose)Was level drawn appropriately? Yes Based on Aminoglycoside level obtained, recommend: Will hold dose pending tobramycin  level < 1.Repeat level: Repeat random level on 7/9 @ 0600 For questions, please contact the pharmacist: Camelia Cap, PharmD           Phone/Mobile Heartbeat:  [facility]

## 2023-09-06 NOTE — Progress Notes
 [facility] Progress NoteHospital Medicine ServiceAttending Provider: Maude EMERSON Macintosh, MD  Location:  8830/8830-CHospital Day #1Subjective   Patient seen and examined on 09/06/2023.CC: Syncope anginosa (HC Code)Interim History: Overnight events reviewed. Seen this morning. Patient was very HOH and lost his charger of hearing aides. Provided with amplifier, which helped. No particular c/o other than a new neck pain. Denied bowel incontinence. Denied CP or SOB. Per daughter, pt has chronic back pain and uses walker.Updated a daughter at bedside.  Objective  Vitals:Temp:  [97.3 ?F (36.3 ?C)-98.7 ?F (37.1 ?C)] 98.6 ?F (37 ?C)Pulse:  [81-92] 81Resp:  [16-18] 16BP: (117-147)/(62-65) 117/65SpO2:  [95 %-97 %] 97 %Device (Oxygen Therapy): room airI/O's:Intake/Output Summary (Last 24 hours) at 09/06/2023 1540Last data filed at 09/06/2023 0546Gross per 24 hour Intake -- Output 1000 ml Net -1000 ml  Physical ExamHENT:    Head: Normocephalic. Eyes:    Conjunctiva/sclera: Conjunctivae normal. Cardiovascular:    Rate and Rhythm: Normal rate and regular rhythm.    Comments: Systolic murmur +Abdominal:    General: Bowel sounds are normal.    Palpations: Abdomen is soft.    Tenderness: There is no abdominal tenderness. Genitourinary:   Comments: Foley catheter/bag with red tinged urine.Skin:   General: Skin is warm and dry. Neurological:    Mental Status: He is alert and oriented to person, place, and time.  Labs:I have reviewed the patient's pertinent labs as resulted in the EMR.Peripheral Access:Periph IV 09/03/23 cephalic (thumb side), left over-the-needle catheter system 18 gauge Paramedic (Active)  Foley Catheter:UreTHral Catheter 09/03/23 0929 100% silicone 16 Registered Nurse 10 10 (Active) Foley order placed.Imaging:No results found. I reviewed lab results, microbiology, imaging, test results, and EKG in formulating this patient's plan of care.   Assessment: 89 y.o. male PMHx HFpEF, CAD, HTN, HLD, AAA, BPH s/p chronic Foley, CKD stage 4/5, b/l hearing loss, and GERD presented with chest pain, found to have small PE. Also with UTI.  Plan:  Small PECTA PE (7/6): Small segmental PE in a lingular pulm artery-elevated trops-denied sob or cp today-cont Eliquis  UTI iso Chronic Foley HematuriaCT A/P (7/6): Stable mod-severe hydroureteronephrosis, chronic cystitis -suspect hematuria iso UTI >> ctm -Ucx 7/6 with borderline pseudomonas, susceptible to tobramycin -Ucx 5/24 with > 100K proteus, susceptible to tobramycin -cont tobramycin  (goal 7 days; ok to switch to cipro  on dc)-random tobra level at 1.5 (threshold to redose is < 1) >> f/u level tomorrow New Neck Pain-seems chronic per chart history but daughter and pt mentioned neck pain is new-suspect positional-ordered lidocaine  patchSclerotic Lesion L Femur, L-sided ribsCT A/P (7/6): Ill-defined sclerotic lesion w/I intertrochanteric region of L femur is new compared to remote prior studies. Subtle sclerotic lesions w/I left-sided ribs, this is favored to represent a metastatic lesion, possibly iso prostate cancer.CTA PE (7/6): Small sclerotic foci in the ribs may be sequelae of prior trauma, fibrous dysplasia although sclerotic lesions are not excluded-consider MRI for further eval-f/u PSAStable AAACT A/P (7/6): Stable infrarenal AAA measuring up to 5.2 cm-pt is aware and follows Cardiologist for surveillance-ctm outpt   Comorbidities Comorbidities present on admission:Chronic Heart Failure with preserved ejection fraction, last EF: 65% 05/10/2023.   PMH of COVID  Secondary diagnoses occurring during hospitalization:HypocalcemiaHyponatremia Diet CardiacVTE PPx:  apixaban  (ELIQUIS ) tablet 10 mgapixaban (ELIQUIS ) tablet 5 mg  Medication Reconciliation: Completed: by Pharmacist Communication: Family: Daughter, RN, and Case ManagementDischarge Readiness: Expected Discharge Date: 09/08/23 Expected discharge location: Short Term Rehab Full CodeI spent 50 minutes today on this encounter before, during and after the visit, reviewing labs  and records, evaluating and examining the patient, entering orders and documenting the visit. Signed:Serai Tukes Raliegh, APRN7/9/20253:40 PM

## 2023-09-06 NOTE — Plan of Care
 Plan of Care Overview/ Patient StatusPt alert and oriented X3, d/o time, very HOH , amplifier at the bedside, VSS, RA no respiratory distress. Pt complains of left neck pain prn tylenol  given and lidocaine  patch applied to the area. Pt on cardiac diet, takes pills whole with water , LBM 7/7, Foley catheter draining serosanguineous urine, provider at the bedside aware. Pt has stage 2 to the coccyx and inc. Dermatitis to the buttocks, applied barrier cream. Pt turning and positioning every two hours. Call bell within reach, bed alarm on, hourly rounding done. Pt remains safe.

## 2023-09-06 NOTE — Plan of Care
 Plan of Care Overview/ Patient Mark [name] is valid.Dc needs list updated for [facility] RN,BSNCare Manager

## 2023-09-06 NOTE — Plan of Care
 Inpatient Physical Therapy Evaluation IP Adult PT Eval/Treat - 09/06/23 0930    Date of Visit / Treatment  Date of Visit / Treatment 09/06/23   Note Type Evaluation   Progress Report Due 09/20/23   Start Time 0850   End Time 0940   Total Treatment Time 40    General Information  Pertinent History Of Current Problem Per chart, 89 y.o. male PMHx HFpEF, CAD, HLD, HTN, abdominal aortic aneurysm, BPH s/p chronic foley cath, CKD 4-5, b/l hearing loss, GERD. Presented to ED from home w/ chest pain, found to have small PE.    Subjective Pt agreeable to session   General Observations Supine in bed on 8-8, RA, foley, NAD. RN cleared for session   Precautions/Limitations Fall Precautions;Bed alarm;Chair alarm   Precautions/Limitations Comment HOH - does not have hearing aids with him at time of session    Prior Level of Functioning/Social History  Prior Level of Function independent with assistive device;assist with ADLs   Patient resides with: Adult Child(ren)   Type of Home Condo   Home Setup --   Chair lifts per patient  Equipment Utilized Prior to Admission/Treatment Rolling walker;Rollator;Cane;Wheelchair   Additional Comments HHA that assist with dressing and bathing    Vital Signs and Orthostatic Vital Signs  Vital Signs Vital Signs Stable   Vital Signs Free text RA, NAD, per chart.  O2 99% on RA at start of session    Pain/Comfort  Pain Comment (Pre/Post Treatment Pain) Patient denies pain at rest.  While sitting edge of bed reporting pain to neck, limiting mobility. Unquantified. RN notified    Patient Coping  Observed Emotional State accepting;cooperative   Verbalized Emotional State acceptance   Diversional Activities television    Cognition  Overall Cognitive Status Impaired   Orientation Level Oriented to person;Oriented to place;Oriented to situation   Level of Consciousness alert;confused   Following Commands Follows one step commands with increased time;Follows one step commands with repetition   Personal Safety / Judgment Fall risk;Decreased recognition / insight of own deficits    Vision/ Hearing  Vision Assessment Results Glasses for reading   Hearing difficulties / Use of hearing aids HOH- hearing aides BL    Range of Motion  Range of Motion Examination bilateral upper extremity ROM was WFL;bilateral lower extremity ROM was [facility]    Manual Muscle Testing  Manual Muscle Testing Comments Grossly at least 3/5; deconditoned    Sensory Assessment  Sensory Tests Results No sensory impairment noted    Skin Assessment  Skin Assessment See Nursing Documentation    Balance  Sitting Balance: Static  FAIR       Maintains static position without assist or device, may require Supervision or Verbal Cues (<2 minutes)   Sitting Balance: Dynamic  FAIR-     Performs dynamic activities through partial range (50-75%) with Contact Guard   Standing Balance: Static POOR+    Minimal assist to maintain static position with no Assistive Device   Standing Balance: Dynamic  POOR     Moves through 1/4 to 1/2 ROM range with moderate assist to right self   Balance Assist Device Rolling walker    Bed Mobility  Supine-to-Sit Independence/Assistance Level Minimum assist   Supine-to-Sit Assist Device Bed rails;Draw pad;Hand held assist;Head of bed elevated   Sit-to-Supine Independence/Assistance Level Moderate assist   Sit-to-Supine Assist Device Hand held assist   Bed Mobility Comments Verbal cues for sequencing    Sit-Stand Transfer Training  Sit-to-Stand Transfer Independence/Assistance Level  Minimum assist   Sit-to-Stand Transfer Assist Device Rolling walker   Stand-to-Sit Transfer Independence/Assistance Level Moderate assist   Stand-to-Sit Transfer Assist Device Rolling walker   Sit-Stand Transfer Comments Verbal cues for hand placement and initation   Stand-Sit Transfer Comments Impaired eccentric control    Gait Training  Independence/Assistance Level  Moderate assist   Assistive Device  Rolling walker   Gait Distance sidesteps   Gait Analysis Deviations decreased cadence;decreased step length;decreased stride length;wide base of support   Gait Analysis Impairments impaired balance;decreased strength;decreased endurance/activity tolerance   Gait Training Comments Slowed and unsteady steps.  Verbal cues for sequencig.  Fatigues quickly    Handoff Documentation  Handoff Patient in bed;Bed alarm;Patient instructed to call nursing for mobility;Discussed with nursing    Activity Tolerance  Activity Tolerance Comments Fair-    PT- AM-PAC - Basic Mobility Screen- How much help from another person do you currently need.....  Turning from your back to your side while in a a flat bed without using rails? 3 - A Little - Requires a little help (supervision, minimal assistance). Can use assistive devices.   Moving from lying on your back to sitting on the side of a flat bed without using bed rails? 2 - A Lot - Requires a lot of help (maximum to moderate assistance). Can use assistive devices.   Moving to and from a bed to a chair (including a wheelchair)? 2 - A Lot - Requires a lot of help (maximum to moderate assistance). Can use assistive devices.   Standing up from a chair using your arms(e.g., wheelchair or bedside chair)? 3 - A Little - Requires a little help (supervision, minimal assistance). Can use assistive devices.   To walk in a hospital room? 1 - Total - Requires total assistance or cannot do it at all.   Climbing 3-5 steps with a railing? 1 - Total - Requires total assistance or cannot do it at all.   AMPAC Mobility Score 12   TARGET Highest Level of Mobility Mobility Level 4, Transfer to chair   ACTUAL Highest Level of Mobility Mobility Level 4, Transfer to chair    Therapeutic Exercise  Therapeutic Exercise Comments Reviewed and encouraged therex; BLE therex sitting edge of bed    Neuromuscular Re-education  Neuromuscular Re-education Comments Static/dynamic balance in sitting and standing    Therapeutic Functional Activity  Therapeutic Functional Activity Comments Bed mobility, transfer training, education on PT role, POC, mobility recommendations and dc planning    Clinical Impression  Initial Assessment Patient seen for evaluation this date. Tolerated session fairly however limited by neck pain and deconditioning.  Ax1 for all mobility, min to mod A.  Slowed for all molbiltiy.  Able to stand and take side steps with verbal cues and assistance for RW management.  Patient reporting neck pain sitting and standing.  RN aware.  Patient demonstrates impairments in strength, balance, pain and activity tolerance and will benefit from skilled PT. Recommend dc with moderate complexity support for Ax1-2 for all mobility and skilled PT when medically appropriate.   Criteria for Skilled Therapeutic Interventions Met yes;treatment indicated   Rehab Potential good, to achieve stated therapy goals    Patient/Family Stated Goals  Patient/Family Stated Goal(s) feel better;decrease pain    Frequency/Equipment Recommendations  PT Frequency 3x per week   Next Treatment Expected 09/07/23   PT/PTA completing this assessment Jailen Coward    PT Recommendations for Inpatient Admission  Activity/Level of Assist transfers only;assist of 1;with rolling walker  ADL Recommendations bedside commode;assist of 1;with rolling walker    Education - Learning Assessment   Education Topic Functional mobility techniques;Positioning;Exercise program;Therapy role/rehab process;Discharge planning   Learners Patient   Readiness Acceptance   Method Explanation   Response Verbalizes Understanding    Planned Treatment / Interventions  Plan for Next Visit Progress as tolerated   General Treatment / Interventions Aerobic Capacity / Endurance Conditioning;Discharge Planning;Therapeutic Exercise   Training Treatment / Interventions Balance / Gait Training;Endurance Training;Functional Personnel officer / Interventions Patient Education / Training    PT Discharge Summary  Physical Therapy Disposition Recommendation Moderate complexity support and therapy to progress functional mobility/ ADLs/ IADLs recommended for post- acute care.  See assessment for additional details.   Additional Therapy Recommendations Physical Therapy Services in Discharge Environment    Problem: Physical Therapy GoalsGoal: Physical Therapy GoalsDescription: PT GOALS1. Patient will perform bed mobility independently2. Patient will sit edge of bed with supervision for at least 5 minutes3. Patient will perform transfers with CGA assist using appropriate assistive device 4. Patient will ambulate a minimum of 100 feet with minimal assist using appropriate assistive device  Outcome: Initial problem identification Sherryle Spindle, PT, DPT

## 2023-09-06 NOTE — Plan of Care
 Problem: Adult Inpatient Plan of CareGoal: Plan of Care ReviewOutcome: Interventions implemented as appropriate Plan of Care Overview/ Patient StatusPt calm and cooperative.  On RA, chronic foley with red urine. Pt HOH, swallowed pills whole. Foley care done, no BM, barrier cream on bottom. No discomfort noted. Denies pain.. Call light within reach and bed alarm on.

## 2023-09-07 ENCOUNTER — Inpatient Hospital Stay: Admit: 2023-09-07 | Payer: Medicare (Managed Care)

## 2023-09-07 LAB — CBC WITHOUT DIFFERENTIAL
BKR WAM ANC (ABSOLUTE NEUTROPHIL COUNT): 5.06 x 1000/ÂµL (ref 2.00–7.60)
BKR WAM ANC (ABSOLUTE NEUTROPHIL COUNT): 5.07 x 1000/ÂµL (ref 2.00–7.60)
BKR WAM HEMATOCRIT: 22.8 % — ABNORMAL LOW (ref 38.50–50.00)
BKR WAM HEMATOCRIT: 26.4 % — ABNORMAL LOW (ref 38.50–50.00)
BKR WAM HEMOGLOBIN: 7.1 g/dL — ABNORMAL LOW (ref 13.2–17.1)
BKR WAM HEMOGLOBIN: 8 g/dL — ABNORMAL LOW (ref 13.2–17.1)
BKR WAM MCH: 23.1 pg — ABNORMAL LOW (ref 27.0–33.0)
BKR WAM MCH: 22.6 pg — ABNORMAL LOW (ref 27.0–33.0)
BKR WAM MCHC: 31.1 g/dL (ref 31.0–36.0)
BKR WAM MCHC: 30.3 g/dL — ABNORMAL LOW (ref 31.0–36.0)
BKR WAM MCV: 74.3 fL — ABNORMAL LOW (ref 80.0–100.0)
BKR WAM MCV: 74.6 fL — ABNORMAL LOW (ref 80.0–100.0)
BKR WAM MPV: 9.3 fL (ref 8.0–12.0)
BKR WAM MPV: 9.7 fL (ref 8.0–12.0)
BKR WAM PLATELETS: 321 x1000/ÂµL (ref 150–420)
BKR WAM PLATELETS: 336 x1000/ÂµL (ref 150–420)
BKR WAM RDW-CV: 16 % — ABNORMAL HIGH (ref 11.0–15.0)
BKR WAM RDW-CV: 16.2 % — ABNORMAL HIGH (ref 11.0–15.0)
BKR WAM RED BLOOD CELL COUNT.: 3.07 M/ÂµL — ABNORMAL LOW (ref 4.00–6.00)
BKR WAM RED BLOOD CELL COUNT.: 3.54 M/ÂµL — ABNORMAL LOW (ref 4.00–6.00)
BKR WAM WHITE BLOOD CELL COUNT: 7.6 x1000/ÂµL (ref 4.0–11.0)
BKR WAM WHITE BLOOD CELL COUNT: 7.9 x1000/ÂµL (ref 4.0–11.0)

## 2023-09-07 LAB — BASIC METABOLIC PANEL
BKR ANION GAP: 12 (ref 7–17)
BKR BLOOD UREA NITROGEN: 42 mg/dL — ABNORMAL HIGH (ref 8–23)
BKR BUN / CREAT RATIO: 11.5 (ref 8.0–23.0)
BKR CALCIUM: 8.3 mg/dL — ABNORMAL LOW (ref 8.8–10.2)
BKR CHLORIDE: 104 mmol/L (ref 98–107)
BKR CO2: 19 mmol/L — ABNORMAL LOW (ref 20–30)
BKR CREATININE DELTA: 0.02
BKR CREATININE: 3.66 mg/dL — ABNORMAL HIGH (ref 0.40–1.30)
BKR EGFR, CREATININE (CKD-EPI 2021): 15 mL/min/1.73m2 — ABNORMAL LOW (ref >=60)
BKR GLUCOSE: 116 mg/dL — ABNORMAL HIGH (ref 70–100)
BKR POTASSIUM: 4.9 mmol/L (ref 3.3–5.3)
BKR SODIUM: 135 mmol/L — ABNORMAL LOW (ref 136–144)

## 2023-09-07 LAB — TOBRAMYCIN LEVEL, RANDOM     (BH GH L LMW YH): BKR TOBRAMYCIN RANDOM: 1.1 ug/mL

## 2023-09-07 MED ORDER — TOBRAMYCIN MAR LEVEL
Freq: Once | INTRAVENOUS | Status: CP
Start: 2023-09-07 — End: ?

## 2023-09-07 NOTE — Other
 [name]-CONSULT  REQUEST  DOCUMENTATION-CONNECT CENTER NOTE-Type of consult: [facility] Orthopedics -New Consult: [id number] /Mark [name] / Location: 8830/8830-C / Brief Clinical Question: 89 yo with ill-defined sclerotic lesion of L femur and ribs without charting hx of malignancy. Is patient a candidate for bone biopsy?/Callback Cell Phone: [phone] / Use .consultnotetemplate or add .consultrecommendation to your consult notes. Remind your attending to use .consultattestation /Please confirm receipt of this message by texting back ?OK?-2 - Smartweb Page sent to [facility] Consult Day Resident [phone] at 11:01 AM.-REMINDER: CONSULT CONNECT IS CLOSED 1900 TO [phone]. PRIMARY TEAM MAY CONTACT YOU DIRECTLY ABOUT CONSULTS. -Braxson Hollingsworth IV, PCT7/10/202511:00 [employer] [phone]

## 2023-09-07 NOTE — Plan of Care
 Plan of Care Overview/ Patient StatusPt HOH amplifier at the bedside,  alert and oriented X3, d/o time, VSS, RA no respiratory distress. Pt complains of left neck pain prn norco given and lidocaine  patch applied to the area. Pt on cardiac diet, takes pills whole with water , LBM 7/7, prn miralax  given. Foley catheter draining serosanguineous urine. Pt has stage 2 to the coccyx and inc. dermatitis to the buttocks, barrier cream applied. Pt turning and positioning every two hours. Call bell within reach, bed alarm on, hourly rounding done. Pt remains safe.

## 2023-09-07 NOTE — Consults
 Medical Oncology Consult Note Assessment Assessment: Mark [name] is a 89 y.o. male admitted with Syncope anginosa [facility] Code), medical oncology consulted by Attending Provider: Nicolas Maude HERO., MD [phone] and seen on 09/07/23 for new sclerotic bone lesions (L femur and L ribs) and elevated PSA c/f new metastatic prostate cancer.89 years old male with past medical history of HFpEF, CAD, BPH, status post chronic Foley's catheter, chronic kidney disease, bilateral hearing loss, GERD, hyperlipidemia, hypertension, abdominal aortic aneurysm, and CKD stage 4-5 who was admitted for CP iso PE and found to have new sclerotic bone lesions and an elevated PSA c/f metastatic prostate cancer. I spoke with both the patient and his daughter today. They both stated interest in understanding what the workup and treatment would entail. The daughter had stated interest in hospice care but upon further clarification, she was not aware that hospice care was end of life care. At this time, we recommend evaluation of the femoral lesion to assess for fracture risk. We will plan to see the patient and his family tomorrow to discuss next steps with regards to diagnostic and therapeutic options.Plan: Inpatient Recommendations:- Ortho consult to assess fragility of L femur- Pending further discussion with patient and his family, and assuming he wants workup and treatment, we would recommend urology consult for prostate biopsy followed by initiation of anti-androgen therapyCommunication:Patient does not yet have an oncologistOutpatient Recommendations:- Pending discussion with family and patient  Subjective Subjective: History of Present Illness / Interval History:89 years old male with past medical history of HFpEF, CAD, BPH, status post chronic Foley's catheter, chronic kidney disease, bilateral hearing loss, GERD, hyperlipidemia, hypertension, abdominal aortic aneurysm, presented with chest pain found to have small PE and UTI. Oncology was consulted for new sclerotic bone lesions and elevated PSA c/f metastatic prostate cancer. Patient has no personal history of cancer. He is currently being treated for PE and hematuria.Oncologic History: Outpatient Oncologist: N/ACancer Diagnosis + Stage: Likely metastatic prostate cancerDate of Diagnosis: Pending biopsyTumor Profiling (if relevant): N/ACurrent Treatment + Last Administration Date: NonePrior Treatment: NoneSocial History: Lives at home with daughter who expresses significant fatigue with caregiving roles Objective Objective: Allergies:Allergies[1]Inpatient Scheduled Medications:Current Facility-Administered Medications Medication Dose Route Frequency Provider Last Rate Last Admin  apixaban  (ELIQUIS ) tablet 10 mg  10 mg Oral Q12H Al Mouslmani, Mhd Yassin, MD   10 mg at 09/07/23 9085  Followed by  NOREEN ON 09/10/2023] apixaban  (ELIQUIS ) tablet 5 mg  5 mg Oral Q12H Al Mouslmani, Mhd Yassin, MD      [Held by provider] aspirin  EC delayed release tablet 81 mg  81 mg Oral Daily Al Mouslmani, Mhd Yassin, MD      clopidogreL  (PLAVIX ) tablet 75 mg  75 mg Oral Daily Al Mouslmani, Mhd Yassin, MD   75 mg at 09/07/23 0914  escitalopram  oxalate (LEXAPRO ) tablet 15 mg  15 mg Oral Daily Al Mouslmani, Mhd Yassin, MD   15 mg at 09/07/23 0914  gabapentin  (NEURONTIN ) capsule 200 mg  200 mg Oral Nightly Al Mouslmani, Mhd Yassin, MD   200 mg at 09/06/23 2057  lidocaine  4 % topical patch 1 patch  1 patch Transdermal Q24H Raliegh Salm, APRN   1 patch at 09/06/23 1125  rosuvastatin  (CRESTOR ) tablet 40 mg  40 mg Oral Daily Al Mouslmani, Mhd Yassin, MD   40 mg at 09/07/23 9085  sodium bicarbonate  tablet 650 mg  650 mg Oral TID Al Mouslmani, Mhd Yassin, MD   650 mg at 09/07/23 0914  sodium chloride  0.9 % flush 3 mL  3 mL IV Push Q8H Al Mouslmani, Mhd Yassin, MD   3 mL at 09/05/23 2038  Tobramycin  Therapy Placeholder Intravenous placeholder Al Mouslmani, Mhd Yassin, MD     Inpatient PRN Medications: acetaminophen   650 mg Oral Q6H PRN  clonazePAM   0.25 mg Oral DAILY PRN  HYDROcodone -acetaminophen   1 tablet Oral Q6H PRN  melatonin  6 mg Oral Nightly PRN  polyethylene glycol  17 g Oral BID PRN  sodium chloride   3 mL IV Push PRN for Line Care Vitals:Temp:  [97.7 ?F (36.5 ?C)-98.4 ?F (36.9 ?C)] 97.7 ?F (36.5 ?C)Pulse:  [73-81] 77Resp:  [18-20] 20BP: (108-126)/(60-71) 126/71SpO2:  [97 %-99 %] 98 % Wt: 09/03/23 82 kg 07/22/23 82 kg 06/10/23 81.6 kg 06/07/23 84.2 kg 05/29/23 82.7 kg 05/12/23 84 kg Physical ExamConstitutional:     General: He is not in acute distress.   Appearance: He is ill-appearing. Pulmonary:    Effort: Pulmonary effort is normal. No respiratory distress. Abdominal:    General: Abdomen is flat. Neurological:    Mental Status: He is alert and oriented to person, place, and time.  Laboratory:I have reviewed the patient's studies within the last 24 hours.Lab Results Component Value Date  WBC 7.6 09/07/2023  HGB 7.1 (L) 09/07/2023  HCT 22.80 (L) 09/07/2023  MCV 74.3 (L) 09/07/2023  PLT 321 09/07/2023 Lab Results Component Value Date  CREATININE 3.66 (H) 09/07/2023  BUN 42 (H) 09/07/2023  NA 135 (L) 09/07/2023  K 4.9 09/07/2023  CL 104 09/07/2023  CO2 19 (L) 09/07/2023 Lab Results Component Value Date  ALT 16 09/04/2023  AST 40 (H) 09/04/2023  GGT 30 07/24/2023  ALKPHOS 173 (H) 09/04/2023  BILITOT 0.3 09/04/2023 Microbiology (if applicable):Diagnostics Last 24 Hours:No results found. The above recommendations have been discussed with the attending, Dr. Abram.This patient's care has been/will be reviewed daily during Inpatient Medical Oncology Consult Rounds. Coverage: Weekdays: For questions regarding consult coverage for individual patients 8A-5P, please contact the '[facility] Oncology Consult' Mobile Heartbeat.Weekend/Evening:  Please refer to [place]. Thank you for involving the Medical Oncology in the care of this patient. Recommendations were communicated to the primary team. We will evaluate the patient tomorrow.I spent a total of 60 minutes on the care of this patient. Signed:Kylyn Mcdade, MDPGY-4 Fellow, Hematology/OncologyYale York General Hospital [1] AllergiesAllergen Reactions  Ace Inhibitors Cough

## 2023-09-07 NOTE — Plan of Care
 Plan of Care Overview/ Patient StatusCalled daughter [name] to discuss on dc planning. Per daughter pt has to go to hospice or SNF at [facility] or any SNF facilities for long term placement. She is not able to manage the care for him at home.Pt has no long term insurance.Called Amy at [facility] and left VM for a call back  Phone #[phone] Addendum at 1.26 pmPer Amy at [facility] pt is not service connected hence long term SNF not covered.But if he is hospice they can try to get him a place at their [address] haven [place] or  approval to one of their contracted SNF. His code status needs to be reviewed.Per chart pt is full code. Daughter made aware. She said they want pt to be on hospice, she will speak with pt and call me back.Katie Ned RN,BSNCare Manager

## 2023-09-07 NOTE — Treatment Summary
 Pt examined for BL hip pain iso new finding on imaging of sclerotic lesions in L femur IT region.Able to SLR5/5 hip flexionNo pain with log rollNo TTPSILT throughoutIntact PF/DFToes wwpNo acute orthopedic intervention at this time. Will discuss with ortho onc team for further recs.Addendum: after brief discussion with Dr. Mitch, would recommend prostate biopsy and L femur needle biopsy as well as outpatient follow-up with ortho oncology Karna Petite, MD

## 2023-09-07 NOTE — Plan of Care
 Mark [name]					Location: EP 94 MED/8830-C89 y.o., male				Attending: Nicolas Maude HERO., MD	Admit Date: 09/03/2023			[id number] LOS: 2 days INITIAL NUTRITION ASSESSMENTDIET ORDER:  Cardiac (3gm Na)ANTHROPOMETRICS:Height: 71Admit wt: 82kgDosing weight: 82kgBMI: 25.21Additional wt information:Ht and wt per chart. Wt hx indicates wt is down 14.5 kg (15%) in the past year--not significant for timeframe. No edema documented per flowsheets. Suspect admit wt carried over from May, will order weekly wts and monitor wt trend as available. Wt hx for the past year:  09/03/23 82 kg 07/22/23 82 kg 06/10/23 81.6 kg 05/29/23 82.7 kg 03/14/23 88.1 kg 02/24/23 86.4 kg 11/17/22 96.6 kg 10/19/22 96.2 kg 09/15/22 96.5 kg Nutrition Focused Physical ExamNoted at least mild buccal and temporal depletion; however, could be age relatedSKIN: Pt has stage 2 PI to coccyx, please refer to  nursing flowsheet/ wound care note for full detailsESTIMATED NUTRITION REQUIREMENTS:Kcal/day: 2050	(25kcal/kg)Protein/day: 98	(1.2gm/kg)Fluids/day: Fluid management per medical team discretion. (11mL/kcal) Needs based on: dosing wt (82kg), age, skin, HF, CKD, UTINUTRITION ASSESSMENT: Chart reviewed for impaired skin integrity. Meds and labs noted. PMHx of HFpEF, CAD, BPH, status post chronic Foley's catheter, chronic kidney disease, bilateral hearing loss, GERD, hyperlipidemia, hypertension, abdominal aortic aneurysm, presented from home hospice with chest pain. Pt seen sleeping, no visitor present--allowed pt to rest. Based on meal review, pt has been receiving 3 meals a day. Per flowsheets, pt consuming 25-100% of meals. Pt will benefit from continued encouragement of PO intake. Cultural/Religious/Ethnic Needs: UnknownFood allergies: NKFANUTRITION DIAGNOSIS:Inadequate oral intake r/t medical status aeb PO documentation and wt lossINTERVENTIONS/RECOMMENDATIONS: Meals and Snacks:- Consider regular diet- encourage PO intakeGoal:  Patient to consume an average of > 50% meals prior to next nutrition assessment Discharge Planning and Transfer of Nutrition Care:  Nutrition related discharge needs still being determined at this time, will continue to follow  MONITORING / EVALUATION:Food/Nutrition-Related History:Food and Nutrient IntakeEnteral and Parenteral Nutrition IntakeAnthropometric Measurements:Weight Biochemical Data, Medical Tests and Procedures:Electrolyte and renal profileGlucose/endocrine profileNutrition-Focus Physical FindingsOverall findingsElectronically signed by Basilia Everitt Dove, RD, July 10, 2025[facility] Dynamic Role - Nutrition Service - [facility] M-F Adult Service EPlease note: Nutrition is a consult only service on weekends and holidays. Please enter a consult in EPIC if assistance is needed on a weekend or holiday or via [facility] Dynamic Role as Food & Nutrition Adult Weekend Dietitian [facility] to contact the covering RD.

## 2023-09-07 NOTE — Plan of Care
 Plan of Care Overview/ Patient StatusPt Aox4 off to date however redirectable, HOH, NAD VSS on RA no tele. Patient complained of having pain and feeling restless, given medication appropriately per [facility] with good effect patient able to get restful sleep.  Patient foley remains in place and intact. Patient resting between care safety ands comfort being maintained

## 2023-09-07 NOTE — Consults
 Aminoglycoside Level EvaluationDosing Strategy: Aminoglycoside Traditional Level EvaluationCurrent Aminoglycoside Order: Tobramycin  loading dose 2 mg/kg IV x 1 followed by dosing per level given CrCl < 20 mL/min Scr: Creatinine (mg/dL) Date Value 92/89/7974 3.66 (H) 09/06/2023 3.64 (H) 09/05/2023 3.71 (H) Estimated Creatinine Clearance: 13 mL/min (A) (by C-G formula based on SCr of 3.66 mg/dL (H)).Indication: Empiric/Culture documented Non-Pseudomonal Gram-Negative Infection or Pseudomonal UTIMonitoring: Random: Lab Results Last 72 Hours Component Value Date/Time  Tobramycin  Random 1.1 09/07/2023 04:43 AM  Tobramycin  Random 1.5 09/06/2023 05:51 AM  Tobramycin  Random 2.1 09/05/2023 06:02 AM  (~ 90 hours after the dose)Was level drawn appropriately? Yes Based on Aminoglycoside level obtained, recommend: Hold repeat dosing until level < 1. Ordered repeat Tobramycin  Random on 7/11 @ 06:00For questions, please contact the pharmacist: Rolin Landing, PharmD           Phone/Mobile Heartbeat: [facility]

## 2023-09-07 NOTE — Progress Notes
 [facility] Progress NoteHospital Medicine ServiceAttending Provider: Maude EMERSON Macintosh, MD  Location:  8830/8830-CHospital Day #2Subjective   Patient seen and examined on 09/07/2023.CC: Syncope anginosa (HC Code)Interim History: Seen this morning and explained to patient regarding his incidental findings of sclerotic lesions. Updated two daughters at bedside and a daughter through phone. Patient seems to be pleasantly confused. Amplifier helps with HOH.   Objective  Vitals:Temp:  [97.7 ?F (36.5 ?C)-98.4 ?F (36.9 ?C)] 97.9 ?F (36.6 ?C)Pulse:  [73-81] 81Resp:  [18-20] 20BP: (108-126)/(60-71) 109/63SpO2:  [97 %-99 %] 97 %Device (Oxygen Therapy): room airI/O's:Intake/Output Summary (Last 24 hours) at 09/07/2023 1544Last data filed at 09/07/2023 1535Gross per 24 hour Intake -- Output 2700 ml Net -2700 ml  Physical ExamHENT:    Head: Normocephalic. Eyes:    Conjunctiva/sclera: Conjunctivae normal. Cardiovascular:    Rate and Rhythm: Normal rate and regular rhythm.    Comments: Systolic murmur +Abdominal:    General: Bowel sounds are normal.    Palpations: Abdomen is soft.    Tenderness: There is no abdominal tenderness. Genitourinary:   Comments: Foley catheter/bag with red tinged urine.Skin:   General: Skin is warm and dry. Neurological:    Mental Status: He is alert and oriented to person, place, and time.  Labs:I have reviewed the patient's pertinent labs as resulted in the EMR.Peripheral Access:Periph IV 09/03/23 cephalic (thumb side), left over-the-needle catheter system 18 gauge Paramedic (Active)  Foley Catheter:UreTHral Catheter 09/03/23 0929 100% silicone 16 Registered Nurse 10 10 (Active) Foley order placed.Imaging:No results found. I reviewed lab results, microbiology, imaging, test results, and EKG in formulating this patient's plan of care. Assessment: 89 y.o. male PMHx HFpEF, CAD, HTN, HLD, AAA, BPH s/p chronic Foley, CKD stage 4/5, b/l hearing loss, and GERD presented with chest pain, found to have small PE. Also being treated for borderline UTI with hematuria. [name] incidentally found sclerotic lesion of L femur and L-sided ribs iso elevated PSA, c/f possible prostate malignancy. Med Onc and Ortho consulted. Pending CXR and XR L femur.  Plan: Sclerotic Lesion L Femur, L-sided ribsCT A/P (7/6): Ill-defined sclerotic lesion w/I intertrochanteric region of L femur is new compared to remote prior studies. Subtle sclerotic lesions w/I left-sided ribs, this is favored to represent a metastatic lesion, possibly iso prostate cancer.CTA PE (7/6): Small sclerotic foci in the ribs may be sequelae of prior trauma, fibrous dysplasia although sclerotic lesions are not excluded-PSA elevated at 140, c/f prostate malignancy-appreciate Med Onc consultation and speaking with family >> f/u recs-appreciate Ortho consultation, ordered CXR and XR L femur >> f/u recs UTI iso Chronic Foley HematuriaCT A/P (7/6): Stable mod-severe hydroureteronephrosis, chronic cystitis -suspect hematuria iso UTI >> ctm -hgb 7.1 <- 7.9 >> f/u repeat H/H this afternoon-transfuse if hgb < 7-Ucx 7/6 with borderline pseudomonas, susceptible to tobramycin -Ucx 5/24 with > 100K proteus, susceptible to tobramycin -cont tobramycin  (goal 7 days; ok to switch to cipro  on dc)-today random tobra level at 1.1 (threshold to redose is < 1) >> f/u level tomorrow Small PECTA PE (7/6): Small segmental PE in a lingular pulm artery-denied sob or cp today-elevated trops-cont Eliquis  New Neck Pain-reported on 7/9, suspect positional-seems chronic per chart history but daughter and pt mentioned neck pain is new-ordered lidocaine  patchStable AAACT A/P (7/6): Stable infrarenal AAA measuring up to 5.2 cm-pt is aware and follows Cardiologist for surveillance-ctm outpt   Comorbidities Comorbidities present on admission:Chronic Heart Failure with preserved ejection fraction, last EF: 65% 05/10/2023. CKD Stage 5 (GFR<15), 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) PMH of  COVID  Secondary diagnoses occurring during hospitalization:HypocalcemiaHyponatremia Diet CardiacVTE PPx:  apixaban  (ELIQUIS ) tablet 10 mgapixaban (ELIQUIS ) tablet 5 mg  Medication Reconciliation: Completed: by Pharmacist Communication: Family: 3 daughters, RN, and Case ManagementDischarge Readiness: Expected Discharge Date: 09/10/23 Expected discharge location: Short Term Rehab Full CodeI spent 50 minutes today on this encounter before, during and after the visit, reviewing labs and records, evaluating and examining the patient, entering orders and documenting the visit. Signed:Tonye Tancredi Raliegh, APRN7/10/20253:44 PM

## 2023-09-07 NOTE — Other
 Mark [name]  REQUEST  DOCUMENTATION-CONNECT CENTER NOTE-Type of consult: [facility] Oncology -New Consult: [id number] /Mark [name] / Location: 8830/8830-C / Reason for Consult? 89 yo pt with sclerotic lesion of L femur and ribs. No charting hx of prostate cancer. PSA elevated. Consulting for evaluation.// Use .consultnotetemplate or add .consultrecommendation to your consult notes. Remind your attending to use .consultattestation /Please confirm receipt of this message by texting back ?OK?-1 - Mobile Heartbeat message sent to [place] at 11:01 AM. Received response at 1101.-REMINDER: CONSULT CONNECT IS CLOSED 1900 TO 0700. PRIMARY TEAM MAY CONTACT YOU DIRECTLY ABOUT CONSULTS. [name]7/10/202511:01 [employer] [phone]

## 2023-09-07 NOTE — Care Coordination-Inpatient
 Call placed to patient Mounsey DTR Susan ph#[phone] we discussed STR for her father, Epple informed this Clinical research associate that she is in agreement with STR.[name] also informed this Clinical research associate that she is interested in establishing a LTC plan for patient Bantz moving forward, she has requested we reach out to the [place] to request LTC services.09/06/2508:40am Call placed to the [facility] ph#[phone] this writer spoke with [facility] rep Olam Ohara [id number] who informed me that he is not services connected for any inpatient STR LTC Olam states that he only can [facility] with the [facility]. 10:58am Call placed to patient Redder daughter [name] ph#[phone] the above details were shared with her.We also discussed STR [name] provided [facility] as the two facilities her father will accept without push back. [name] states that her father only has 10 to 15 day left for STR. We also discussed her fathers need for LTC INS [name] states that she has been looking into establishing conservitorship and [facility] but has not initiated the process at the time of this call.Referrals have been sent for review we are currently awaiting responses.  Care management will continue to follow along with the patient's medical team as needed.Troy JonesTransition CoordinatorCare Management (YSC)

## 2023-09-08 LAB — IRON AND TIBC
BKR IRON SATURATION: 40 % (ref 15–50)
BKR IRON: 58 ug/dL — ABNORMAL LOW (ref 59–158)
BKR TOTAL IRON BINDING CAPACITY: 146 ug/dL — ABNORMAL LOW (ref 250–450)

## 2023-09-08 LAB — CBC WITH AUTO DIFFERENTIAL
BKR WAM ABSOLUTE IMMATURE GRANULOCYTES.: 0.04 x 1000/ÂµL (ref 0.00–0.30)
BKR WAM ABSOLUTE LYMPHOCYTE COUNT.: 1.57 x 1000/ÂµL (ref 0.60–3.70)
BKR WAM ABSOLUTE NRBC: 0 x 1000/ÂµL (ref 0.00–1.00)
BKR WAM ANC (ABSOLUTE NEUTROPHIL COUNT): 5.09 x 1000/ÂµL (ref 2.00–7.60)
BKR WAM BASOPHIL ABSOLUTE COUNT.: 0.03 x 1000/ÂµL (ref 0.00–1.00)
BKR WAM BASOPHILS: 0.4 % (ref 0.0–1.4)
BKR WAM EOSINOPHIL ABSOLUTE COUNT.: 0.59 x 1000/ÂµL (ref 0.00–1.00)
BKR WAM EOSINOPHILS: 7.4 % — ABNORMAL HIGH (ref 0.0–5.0)
BKR WAM HEMATOCRIT: 22.4 % — ABNORMAL LOW (ref 38.50–50.00)
BKR WAM HEMOGLOBIN: 6.9 g/dL — ABNORMAL LOW (ref 13.2–17.1)
BKR WAM IMMATURE GRANULOCYTES: 0.5 % (ref 0.0–1.0)
BKR WAM LYMPHOCYTES: 19.6 % (ref 17.0–50.0)
BKR WAM MCH: 22.8 pg — ABNORMAL LOW (ref 27.0–33.0)
BKR WAM MCHC: 30.8 g/dL — ABNORMAL LOW (ref 31.0–36.0)
BKR WAM MCV: 73.9 fL — ABNORMAL LOW (ref 80.0–100.0)
BKR WAM MONOCYTE ABSOLUTE COUNT.: 0.68 x 1000/ÂµL (ref 0.00–1.00)
BKR WAM MONOCYTES: 8.5 % (ref 4.0–12.0)
BKR WAM MPV: 9.5 fL (ref 8.0–12.0)
BKR WAM NEUTROPHILS: 63.6 % (ref 39.0–72.0)
BKR WAM NUCLEATED RED BLOOD CELLS: 0 % (ref 0.0–1.0)
BKR WAM PLATELETS: 319 x1000/ÂµL (ref 150–420)
BKR WAM RDW-CV: 15.9 % — ABNORMAL HIGH (ref 11.0–15.0)
BKR WAM RED BLOOD CELL COUNT.: 3.03 M/ÂµL — ABNORMAL LOW (ref 4.00–6.00)
BKR WAM WHITE BLOOD CELL COUNT: 8 x1000/ÂµL (ref 4.0–11.0)

## 2023-09-08 LAB — BASIC METABOLIC PANEL
BKR ANION GAP: 13 (ref 7–17)
BKR BLOOD UREA NITROGEN: 46 mg/dL — ABNORMAL HIGH (ref 8–23)
BKR BUN / CREAT RATIO: 12.4 (ref 8.0–23.0)
BKR CALCIUM: 8 mg/dL — ABNORMAL LOW (ref 8.8–10.2)
BKR CHLORIDE: 103 mmol/L (ref 98–107)
BKR CO2: 20 mmol/L (ref 20–30)
BKR CREATININE DELTA: 0.06
BKR CREATININE: 3.72 mg/dL — ABNORMAL HIGH (ref 0.40–1.30)
BKR EGFR, CREATININE (CKD-EPI 2021): 14 mL/min/1.73m2 — ABNORMAL LOW (ref >=60)
BKR GLUCOSE: 116 mg/dL — ABNORMAL HIGH (ref 70–100)
BKR POTASSIUM: 5.2 mmol/L (ref 3.3–5.3)
BKR SODIUM: 136 mmol/L (ref 136–144)

## 2023-09-08 LAB — BLOOD CULTURE
BKR BLOOD CULTURE: NO GROWTH
BKR BLOOD CULTURE: NO GROWTH

## 2023-09-08 LAB — HEMOGLOBIN AND HEMATOCRIT, BLOOD
BKR WAM HEMATOCRIT: 23.1 % — ABNORMAL LOW (ref 38.50–50.00)
BKR WAM HEMOGLOBIN: 7 g/dL — ABNORMAL LOW (ref 13.2–17.1)

## 2023-09-08 LAB — FERRITIN: BKR FERRITIN: 819 ng/mL — ABNORMAL HIGH (ref 30–400)

## 2023-09-08 LAB — TOBRAMYCIN LEVEL, RANDOM     (BH GH L LMW YH): BKR TOBRAMYCIN RANDOM: 0.8 ug/mL

## 2023-09-08 NOTE — Other
 Mark [name]  REQUEST  DOCUMENTATION-CONNECT CENTER NOTE-Type of consult: [facility] Urology -New Consult: [id number] /Korbyn Liptak / Location: 8830/8830-C / Brief Clinical Question: Pt with chronic foley now has hematuria for ~ 3 days. Also with needs of potential prostate biopsy for malignancy w/u. Consulting for evaluation.Reason for Consult? hematuria/Callback Cell Phone: [phone]/ Please confirm receipt of this message by texting back ?OK?-1 - Mobile Heartbeat message sent to Kathi Bare F at 11:43 AM. Received response at 1145.-REMINDER: CONSULT CONNECT IS CLOSED 1900 TO 0700. PRIMARY TEAM MAY CONTACT YOU DIRECTLY ABOUT CONSULTS. Margretta Dick7/11/202511:43 [employer] [phone]

## 2023-09-08 NOTE — Plan of Care
 Plan of Care Overview/ Patient Status0700-1900Pt is AAOx3 with some confusion noted at times. HOH ith amplifier and hearing aids at bedside. C/O neck pain with lidocaine  patch in place. New 22 Foley placed by IR today with irrigation done. Hematuria. Low H&H this am, s/p blood transfusion with no suspected reaction. VSS. No tele. LBM 09/04/2023. PRNs given. Stage 2 to coccyx with q2 repositioning done. Barrier cream applied. Family at bedside. Call bell in reach. Safety plan maintained. See flowsheet for further details.

## 2023-09-08 NOTE — Consults
 Aminoglycoside Level EvaluationDosing Strategy: Aminoglycoside Traditional Level EvaluationCurrent Aminoglycoside Order: Tobramycin  loading dose 2 mg/kg IV x 1, then followed by dose per level given crcl < 20 ml/min  Scr: Creatinine (mg/dL) Date Value 92/88/7974 3.72 (H) 09/07/2023 3.66 (H) 09/06/2023 3.64 (H) CrCl: Estimated Creatinine Clearance: 12 mL/min (A) (by C-G formula based on SCr of 3.72 mg/dL (H)).Renal Function: Stable, CKD at baselineIndication: Empiric/Culture documented Non-Pseudomonal Gram-Negative Infection or Pseudomonal UTIMonitoring: Random: Lab Results Last 72 Hours Component Value Date/Time  Tobramycin  Random 0.8 09/08/2023 05:22 AM  Tobramycin  Random 1.1 09/07/2023 04:43 AM  Tobramycin  Random 1.5 09/06/2023 05:51 AM  (114 hours after the dose)Was level drawn appropriately? Yes Based on Aminoglycoside level obtained, recommend: Given tobramycin  level still detected and we are on day 6 of abx, expect tobramycin  to still be in system for another day or two. Patient has slow clearance of tobramycin  given that we gave patient their first dose of tobramycin  on 7/6 and level is still detectable. Would avoid giving additional dose of tobramycin  given poor renal function as expect additional dose would last in the system for a good 5-7 days. Discussed with provider, would recommend no longer checking further levels and let tobramycin  clear from system as expect it to still be detectable for a few more days and patient has received sufficient duration of antibioticsRepeat level: Not necessaryFor questions, please contact the pharmacist: Camelia Cap, PharmD           Phone/Mobile Heartbeat:   [facility]

## 2023-09-08 NOTE — Other
 Medical Oncology Consult Note Assessment Assessment: Mark [name] is a 89 y.o. male admitted with Syncope anginosa [facility] Code), medical oncology consulted by Attending Provider: Nicolas Maude HERO., MD [phone] and seen on 09/08/23 for metastatic prostate cancer.Overall, the picture is consistent with metastatic prostate cancer. We had an extensive discussion today with the patient and his daughter [name] (by phone). He is seemingly asymptomatic, and we discussed that he has very possibly had this for years. We discussed that we cannot definitively know that treating the prostate cancer will substantially lengthen his life (as he has competing risks with other medical issues), but that the side effects of prostate cancer therapy are often much milder than what many people think of with other cancers that require cytotoxic chemotherapy. We discussed that, ideally, we would want to obtain a tissue diagnosis (from prostate, not necessarily from bone), likely start an oral agent here (bicalutamide ), and then have outpatient follow-up with [facility] oncology for ADT (Leuprolide). They would also likely obtain additional bone imaging. We discussed the opposite approach, if preferred by the patient, would be to not pursue any further evaluation or treatment for the prostate cancer, though this comes with a risk of the prostate cancer worsening and causing symptoms or increasing the potential for fracture. We also discussed that there could be in between paths, such as start bicalutamide  and seeing an outpatient oncologist about ADT, but not necessarily pursuing the invasive testing like biopsy. He and his daughter verbalized understanding, and would like time to think about the options.Plan: Inpatient Recommendations:- Appreciate medical team's management of UTI, hematuria, anemia, PE- The patient and his daughter are deciding on whether to pursue further care. Please contact the team on Monday to continue the discussions.Outpatient Recommendations:- If the patient would like to pursue further prostate cancer care, then would suggest expedited prostate biopsy and follow-up with genitourinary oncology.   Subjective Subjective: History of Present Illness / Interval History:[name] is a 89 y.o. gentleman admitted with chest pain, found to have sclerotic bone lesions and elevated PSA suggestive of metastatic prostate cancer. Oncology is consulted for guidance on further management. Briefly, his initial [place]-PE showed a small PE and also potential rib metastases. Subsequent abd/pelv imaging showed a sclerotic left femoral lesion. PSA was measured and was 140.He feels well. He had a decrease in hemoglobin to 6.9 and hematuria (chronic indwelling foley, being treated for UTI, and now on anticoagulation for PE). Oncologic History: Outpatient Oncologist: TBDCancer Diagnosis + Stage: suspected metastatic prostate cancerDate of Diagnosis: this admissionTumor Profiling (if relevant): N/ACurrent Treatment + Last Administration Date: None yetPrior Treatment: NoneSocial History: Daughter [name] is primary caregiver.Social History[1] Objective Objective: Allergies:Allergies[2]Inpatient Scheduled Medications:Current Facility-Administered Medications Medication Dose Route Frequency Provider Last Rate Last Admin  apixaban  (ELIQUIS ) tablet 10 mg  10 mg Oral Q12H Al Mouslmani, Mhd Yassin, MD   10 mg at 09/08/23 9048  Followed by  NOREEN ON 09/10/2023] apixaban  (ELIQUIS ) tablet 5 mg  5 mg Oral Q12H Al Mouslmani, Mhd Yassin, MD      [Held by provider] aspirin  EC delayed release tablet 81 mg  81 mg Oral Daily Al Mouslmani, Mhd Yassin, MD      clopidogreL  (PLAVIX ) tablet 75 mg  75 mg Oral Daily Al Mouslmani, Mhd Yassin, MD   75 mg at 09/08/23 0951  escitalopram  oxalate (LEXAPRO ) tablet 15 mg  15 mg Oral Daily Al Mouslmani, Mhd Yassin, MD   15 mg at 09/08/23 0951  gabapentin  (NEURONTIN ) capsule 200 mg  200 mg Oral Nightly  Al Mouslmani, Mhd Yassin, MD   200 mg at 09/07/23 2050  lidocaine  4 % topical patch 1 patch  1 patch Transdermal Q24H Raliegh Salm, APRN   1 patch at 09/07/23 1134  rosuvastatin  (CRESTOR ) tablet 40 mg  40 mg Oral Daily Al Mouslmani, Mhd Yassin, MD   40 mg at 09/08/23 0951  sodium bicarbonate  tablet 650 mg  650 mg Oral TID Al Mouslmani, Mhd Yassin, MD   650 mg at 09/08/23 0951  sodium chloride  0.9 % flush 3 mL  3 mL IV Push Q8H Al Mouslmani, Mhd Yassin, MD   3 mL at 09/07/23 1357  Tobramycin  Therapy Placeholder   Intravenous placeholder Al Mouslmani, Mhd Yassin, MD     Inpatient PRN Medications: acetaminophen   650 mg Oral Q6H PRN  clonazePAM   0.25 mg Oral DAILY PRN  HYDROcodone -acetaminophen   1 tablet Oral Q6H PRN  melatonin  6 mg Oral Nightly PRN  polyethylene glycol  17 g Oral BID PRN  sodium chloride   3 mL IV Push PRN for Line Care Vitals:Temp:  [97.8 ?F (36.6 ?C)-98.7 ?F (37.1 ?C)] 97.8 ?F (36.6 ?C)Pulse:  [81-87] 85Resp:  [20] 20BP: (109-128)/(63-70) 128/70SpO2:  [96 %-97 %] 96 % Wt: 09/03/23 82 kg 07/22/23 82 kg 06/10/23 81.6 kg 06/07/23 84.2 kg 05/29/23 82.7 kg 05/12/23 84 kg Physical ExamConstitutional:     General: He is not in acute distress.   Comments: Alert, but hard of hearing Cardiovascular:    Rate and Rhythm: Normal rate. Pulmonary:    Effort: Pulmonary effort is normal. Abdominal:    Palpations: Abdomen is soft.    Tenderness: There is no abdominal tenderness. Neurological:    Mental Status: He is alert.  Laboratory:I have reviewed the patient's studies within the last 24 hours.Lab Results Component Value Date  WBC 8.0 09/08/2023  HGB 7.0 (L) 09/08/2023  HCT 23.10 (L) 09/08/2023  MCV 73.9 (L) 09/08/2023  PLT 319 09/08/2023 Lab Results Component Value Date  CREATININE 3.72 (H) 09/08/2023  BUN 46 (H) 09/08/2023  NA 136 09/08/2023  K 5.2 09/08/2023  CL 103 09/08/2023  CO2 20 09/08/2023 Lab Results Component Value Date  ALT 16 09/04/2023  AST 40 (H) 09/04/2023  GGT 30 07/24/2023  ALKPHOS 173 (H) 09/04/2023  BILITOT 0.3 09/04/2023 Microbiology (if applicable):Diagnostics Last 24 Hours:XR Chest PA and LateralResult Date: 09/07/2023 Lung nodules are better seen on chest [place]. Follow-up is recommended in 6 months for 8mm nodule per Fleischner Society guidelines.  [facility] Radiology Notify System Classification: Routine. Reported and signed by: Toy Barley, MD  [facility] Radiology and Biomedical Imaging This patient's care has been/will be reviewed daily during Inpatient Medical Oncology Consult Rounds. Coverage: Weekdays: For questions regarding consult coverage for individual patients 8A-5P, please contact the '[facility] Oncology Consult' Mobile Heartbeat.Weekend/Evening:  Please refer to [place]. Thank you for involving the Medical Oncology in the care of this patient. Recommendations were communicated to the primary team. We will evaluate the patient on Monday.I spent a total of 55 minutes on the care of this patient. Signed:						Alm LABOR. Abram, MD, PhDYale Cancer Center / [facility]  [1] Social HistoryTobacco Use  Smoking status: Former   Passive exposure: Never  Smokeless tobacco: Never Vaping Use  Vaping status: Never Used Substance Use Topics  Alcohol  use: Not Currently  Drug use: Never [2] AllergiesAllergen Reactions  Ace Inhibitors Cough

## 2023-09-08 NOTE — Progress Notes
 [facility] Progress NoteHospital Medicine ServiceAttending Provider: Maude EMERSON Macintosh, MD  Location:  8830/8830-CHospital Day #3Subjective   Patient seen and examined on 09/08/2023.CC: Syncope anginosa (HC Code)Interim History: Seen this AM in NAD. Overnight events reviewed.  Objective  Vitals:Temp:  [97.8 ?F (36.6 ?C)-98.7 ?F (37.1 ?C)] 98.5 ?F (36.9 ?C)Pulse:  [81-89] 89Resp:  [18-20] 20BP: (110-138)/(54-72) 138/72SpO2:  [96 %-98 %] 97 %Device (Oxygen Therapy): room airI/O's:Intake/Output Summary (Last 24 hours) at 09/08/2023 1643Last data filed at 09/08/2023 1345Gross per 24 hour Intake 350 ml Output 275 ml Net 75 ml  Physical ExamHENT:    Head: Normocephalic. Eyes:    Conjunctiva/sclera: Conjunctivae normal. Cardiovascular:    Rate and Rhythm: Normal rate and regular rhythm.    Comments: Systolic murmur +Abdominal:    General: Bowel sounds are normal.    Palpations: Abdomen is soft.    Tenderness: There is no abdominal tenderness. Genitourinary:   Comments: Foley catheter/bag with red tinged urine.Skin:   General: Skin is warm and dry. Neurological:    Mental Status: He is alert and oriented to person, place, and time.  Labs:I have reviewed the patient's pertinent labs as resulted in the EMR.Peripheral Access:Periph IV 09/03/23 cephalic (thumb side), left over-the-needle catheter system 18 gauge Paramedic (Active)  Foley Catheter:UreTHral Catheter 09/03/23 0929 100% silicone 16 Registered Nurse 10 10 (Active) Foley order placed.Imaging:No results found. I reviewed lab results, microbiology, imaging, test results, and EKG in formulating this patient's plan of care.   Assessment: 89 y.o. male PMHx HFpEF, CAD, HTN, HLD, AAA, BPH s/p chronic Foley, CKD stage 4/5, b/l hearing loss, and GERD presented with chest pain, found to have small PE. Also being treated for borderline UTI with hematuria. [place] incidentally found sclerotic lesion of L femur and L-sided ribs iso elevated PSA, c/f possible prostate malignancy.  Plan: Sclerotic Lesion L Femur, L-sided ribsCT A/P (7/6): Ill-defined sclerotic lesion w/I intertrochanteric region of L femur is new compared to remote prior studies. Subtle sclerotic lesions w/I left-sided ribs, this is favored to represent a metastatic lesion, possibly iso prostate cancer.CTA PE (7/6): Small sclerotic foci in the ribs may be sequelae of prior trauma, fibrous dysplasia although sclerotic lesions are not excludedXR L Femur (7/11): no suspicious osseous lesionCXR (7/10): No consolidation. Stable bibasilar linear opacities. Lung nodules are better seen on chest [place].-PSA elevated at 140, c/f prostate malignancy-appreciate Med Onc recs, s/p extensive discussion with pt and daughter [name] who are currently deciding on whether to pursue further care. If yes, suggests expedited prostate biopsy and f/u with [facility] Onc Consult Note by Abram Lenis, MD (09/08/2023 12:17 PM) -appreciate Ortho recs, proposed prostate biopsy and L femur needle biopsy, f/u with ortho oncology team-appreciate Urology eval -- f/u recsUTI iso Chronic Foley HematuriaCT A/P (7/6): Stable mod-severe hydroureteronephrosis, chronic cystitis -suspect hematuria iso UTI >> ctm -hgb as mentioned below-Ucx 7/6 with borderline pseudomonas, susceptible to tobramycin -Ucx 5/24 with > 100K proteus, susceptible to tobramycin -cont tobramycin  x 7 days (dosed on 7/6 and no further dose needed given pt has slow clearance - per discussion with pharmacy today)-appreciate Urology eval today, upsized foley -- f/u recs Acute on Chronic Microcytic Anemia-Hgb 6.9 with repeat 7.0 -> giving 1 u pRBC today (consent obtained)-transfuse for hgb < 7-f/u repeat CBC tonight -f/u iron studies Small PECTA PE (7/6): Small segmental PE in a lingular pulm artery-denied sob or cp today-elevated trops-cont Eliquis  New Neck Pain-reported on 7/9, suspect positional-seems chronic per chart history but daughter and pt mentioned neck pain is new-ordered  lidocaine  patchStable AAACT A/P (7/6): Stable infrarenal AAA measuring up to 5.2 cm-pt is aware and follows Cardiologist for surveillance-ctm outpt   Comorbidities Comorbidities present on admission:Chronic Heart Failure with preserved ejection fraction, last EF: 65% 05/10/2023. CKD Stage 5 (GFR<15), 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) PMH of COVID  Secondary diagnoses occurring during hospitalization:HypocalcemiaHyponatremia Diet CardiacVTE PPx:  apixaban  (ELIQUIS ) tablet 10 mgapixaban (ELIQUIS ) tablet 5 mg  Medication Reconciliation: Completed: by Pharmacist Communication: RN and Case ManagementDischarge Readiness: Expected Discharge Date: 09/11/23 Expected discharge location: Short Term Rehab Full CodeI spent 50 minutes today on this encounter before, during and after the visit, reviewing labs and records, evaluating and examining the patient, entering orders and documenting the visit. Signed:Kealani Leckey Raliegh, APRN7/11/20254:43 PM

## 2023-09-09 ENCOUNTER — Inpatient Hospital Stay: Admit: 2023-09-09 | Payer: Medicare (Managed Care)

## 2023-09-09 LAB — BASIC METABOLIC PANEL
BKR ANION GAP: 14 (ref 7–17)
BKR BLOOD UREA NITROGEN: 43 mg/dL — ABNORMAL HIGH (ref 8–23)
BKR BUN / CREAT RATIO: 11.8 (ref 8.0–23.0)
BKR CALCIUM: 8.4 mg/dL — ABNORMAL LOW (ref 8.8–10.2)
BKR CHLORIDE: 104 mmol/L (ref 98–107)
BKR CO2: 18 mmol/L — ABNORMAL LOW (ref 20–30)
BKR CREATININE DELTA: -0.09
BKR CREATININE: 3.63 mg/dL — ABNORMAL HIGH (ref 0.40–1.30)
BKR EGFR, CREATININE (CKD-EPI 2021): 15 mL/min/1.73m2 — ABNORMAL LOW (ref >=60)
BKR GLUCOSE: 150 mg/dL — ABNORMAL HIGH (ref 70–100)
BKR POTASSIUM: 4.9 mmol/L (ref 3.3–5.3)
BKR SODIUM: 136 mmol/L (ref 136–144)

## 2023-09-09 LAB — CBC WITH AUTO DIFFERENTIAL
BKR WAM ABSOLUTE IMMATURE GRANULOCYTES.: 0.03 x 1000/ÂµL (ref 0.00–0.30)
BKR WAM ABSOLUTE LYMPHOCYTE COUNT.: 1.04 x 1000/ÂµL (ref 0.60–3.70)
BKR WAM ABSOLUTE NRBC: 0 x 1000/ÂµL (ref 0.00–1.00)
BKR WAM ANC (ABSOLUTE NEUTROPHIL COUNT): 5.52 x 1000/ÂµL (ref 2.00–7.60)
BKR WAM BASOPHIL ABSOLUTE COUNT.: 0.03 x 1000/ÂµL (ref 0.00–1.00)
BKR WAM BASOPHILS: 0.4 % (ref 0.0–1.4)
BKR WAM EOSINOPHIL ABSOLUTE COUNT.: 0.38 x 1000/ÂµL (ref 0.00–1.00)
BKR WAM EOSINOPHILS: 5 % (ref 0.0–5.0)
BKR WAM HEMATOCRIT: 29.9 % — ABNORMAL LOW (ref 38.50–50.00)
BKR WAM HEMOGLOBIN: 9.3 g/dL — ABNORMAL LOW (ref 13.2–17.1)
BKR WAM IMMATURE GRANULOCYTES: 0.4 % (ref 0.0–1.0)
BKR WAM LYMPHOCYTES: 13.8 % — ABNORMAL LOW (ref 17.0–50.0)
BKR WAM MCH: 24.2 pg — ABNORMAL LOW (ref 27.0–33.0)
BKR WAM MCHC: 31.1 g/dL (ref 31.0–36.0)
BKR WAM MCV: 77.7 fL — ABNORMAL LOW (ref 80.0–100.0)
BKR WAM MONOCYTE ABSOLUTE COUNT.: 0.54 x 1000/ÂµL (ref 0.00–1.00)
BKR WAM MONOCYTES: 7.2 % (ref 4.0–12.0)
BKR WAM MPV: 9.5 fL (ref 8.0–12.0)
BKR WAM NEUTROPHILS: 73.2 % — ABNORMAL HIGH (ref 39.0–72.0)
BKR WAM NUCLEATED RED BLOOD CELLS: 0 % (ref 0.0–1.0)
BKR WAM PLATELETS: 324 x1000/ÂµL (ref 150–420)
BKR WAM RDW-CV: 16.6 % — ABNORMAL HIGH (ref 11.0–15.0)
BKR WAM RED BLOOD CELL COUNT.: 3.85 M/ÂµL — ABNORMAL LOW (ref 4.00–6.00)
BKR WAM WHITE BLOOD CELL COUNT: 7.5 x1000/ÂµL (ref 4.0–11.0)

## 2023-09-09 MED ORDER — CHLORHEXIDINE GLUCONATE 4 % TOPICAL LIQUID
4 | Freq: Every day | TOPICAL | Status: AC
Start: 2023-09-09 — End: ?
  Administered 2023-09-09 – 2023-09-21 (×13): 4 mL via TOPICAL

## 2023-09-09 MED ORDER — ETHYL ALCOHOL 62 % TOPICAL SWAB
62 | Freq: Two times a day (BID) | NASAL | Status: AC
Start: 2023-09-09 — End: ?
  Administered 2023-09-14 – 2023-09-21 (×14): 62 % via NASAL

## 2023-09-09 MED ORDER — MUPIROCIN 2 % TOPICAL OINTMENT
2 | Freq: Two times a day (BID) | TOPICAL | Status: CP
Start: 2023-09-09 — End: ?
  Administered 2023-09-09 – 2023-09-14 (×10): 2 % via TOPICAL

## 2023-09-09 NOTE — Plan of Care
 Problem: Adult Inpatient Plan of CareGoal: Plan of Care ReviewOutcome: Interventions implemented as appropriate Plan of Care Overview/ Patient StatusPt calm and cooperative. HOH. Watching tv. Red urine in foley catheter. Foley care done. Pt inc and continent of stool. Ambulated with walker with x2 assist to toiler. Too weak to walk back. Sit to stand lift used to get back into bed. Pt relaxed in bed afterwards. Barrier cream applied to bottom. Swallowed meds whole. Call light within reach, bed alarm on.

## 2023-09-09 NOTE — Plan of Care
 Plan of Care Overview/ Patient Status0700-1900Pt is AAOx3 with some confusion noted at times. HOH with hearing aids at bedside brought today. C/O neck pain with lidocaine  patch in place. 22 Foley in place. Hematuria. VSS. No tele. LBM 09/09/2023. Stage 2 to coccyx with q2 repositioning done. Barrier cream applied. Family at bedside. Call bell in reach. Safety plan maintained. See flowsheet for further details.

## 2023-09-09 NOTE — Progress Notes
 [name]. CampusHospital Medicine Progress NoteAttending Provider: Lucendia Bring, MD  Location:  8830/8830-CHospital Day #4Subjective   I saw and examined Mark [name] (DOB: 05/19/1925, [id number]) on 09/09/2023.Principal problem: Syncope anginosa (HC Code)Interim History: S/p 1unit RBC yesterday. Continued hematuria overnight. LBM 7/7. Pt offers no new complaints today.  Objective  Vitals:Temp:  [97.4 ?F (36.3 ?C)-98.6 ?F (37 ?C)] 97.4 ?F (36.3 ?C)Pulse:  [78-85] 78Resp:  [18-20] 20BP: (148-161)/(73-88) 161/74SpO2:  [97 %-99 %] 98 %Device (Oxygen Therapy): room air Wt Readings from Last 2 Encounters: 09/03/23 82 kg 07/22/23 82 kg Body mass index is 25.21 kg/m?.I/O's:Intake/Output Summary (Last 24 hours) at 09/09/2023 1725Last data filed at 09/09/2023 1500Gross per 24 hour Intake 1440 ml Output 1600 ml Net -160 ml  Physical ExamConstitutional:     General: He is not in acute distress.   Appearance: Normal appearance. Cardiovascular:    Rate and Rhythm: Normal rate and regular rhythm.    Heart sounds: Murmur heard. Pulmonary:    Effort: Pulmonary effort is normal. No respiratory distress.    Breath sounds: Normal breath sounds. Abdominal:    Palpations: Abdomen is soft.    Tenderness: There is no abdominal tenderness. There is no guarding. Genitourinary:   Comments: Foley inserted outputting dark red urineNeurological:    Mental Status: He is alert.  Labs:I have reviewed the patient's pertinent labs as resulted in the EMR.CBC Last 24hrs: WBC/Hgb/Hct/Plts:  7.5/9.3/29.90/324 (07/12 0510)Lytes Last 24hrs: Na/K/Cl/CO2:  136/4.9/104/18 (07/12 0510)Chem Last 24hrs: BUN/Cr/GLU/ALT/AST/AMYLASE/LIPASE:  43/3.63/150/--/--/--/-- (07/12 0510)Peripheral Access:Periph IV 09/03/23 cephalic (thumb side), left over-the-needle catheter system 18 gauge Paramedic (Active)  Foley Catheter:UreTHral Catheter 09/08/23 22 Physician (Active) Foley order placed.Imaging:US  Pelvis LimitedResult Date: 7/12/2025US PELVIS LIMITED HISTORY: Hematuria w/u. COMPARISON: US  RENAL 2022-12-19. TECHNIQUE: Grayscale imaging were performed. FINDINGS: Urinary bladder is completely collapsed by a Foley catheter, limiting evaluation (Per provider, catheter was clamped 6 hours prior to the study).  Urinary bladder is completely collapsed by a Foley catheter, significantly limiting evaluation and rendering study nondiagnostic. [facility] Notify System Classification: Routine. Reported and signed by: Dorn Norman, MD  [facility] Radiology and Biomedical Imaging  I reviewed lab results, imaging, and test results in formulating this patient's plan of care.   Assessment: 89 y.o. male with PMH significant for HFpEF, CAD s/p PCI (2019), BPH s/p chronic indwelling urinary catheter, CKD, hearing loss, GERD, HTN, HLD, and AAA was admitted to [facility] for management of a small segmental PE after presenting on 09/03/23 for evaluation of chest pain. Found to have possible UTI as well as L femur and L rib sclerotic lesions and elevated PSA c/f prostate malignancy for which Urology and Oncology are consulting.  Plan: # Possible metastatic prostate cancerAs evident by sclerotic lesions on [place] and XR imaging. PSA 140. Oncology and Urology consulting. - Family and patient currently deciding whether to continue with biopsy and systemic care or alternatively consider a more palliative approach# Hematuria# B/L moderate-severe hydroureteronephrosis# Possible UTIDue to UTI vs prostate cancer. Urology consultingUcx with pseudomonas susceptible to tobra- bladder US : decompressed urinary bladder- continue foley catetherization- trend H/h- no urologic interventions planned at this time- s/p tobra dose on 7/6 (no additional dosing needed due to renal clearance for pharmacy)# Acute on chronic microcytic anemiaS/p 1 unit RBC transfusion on 7/11 (Hgb 8-> 6.9 -> 9.3)- trend CBC, transfuse for goal Hgb >7# Acute small segmental PE (intermediate-low risk)- continue apixaban  load today then maintenance tomorrow (holding ASA)# Neck painSuspected to be MSK etiology / positional- lidocaine  patch daily# CAD s/p PCI x2 (2019)- stop  plavix  given hematuria and concomitant AC- continue ASA 81mg  daily- continue statin  Comorbidities Comorbidities present on admission:Chronic Heart Failure with preserved ejection fraction, last EF: 65% 05/10/2023. CKD Stage 5 (GFR<15), 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) PMH of COVID  Secondary diagnoses occurring during hospitalization:HypocalcemiaHyponatremia Diet: Diet CardiacVTE Prophylaxis:  apixaban  (ELIQUIS ) tablet 10 mgapixaban (ELIQUIS ) tablet 5 mg  Code Status: Full CodeDisposition: Continue inpatient careMedication Reconciliation: Completed: by Pharmacist Communication: RNDischarge Readiness: Expected Discharge Date: 09/11/23 AM-PAC (RN/PT): 13 / 12 Physical Therapy Disposition Recommendation: Moderate complexity (09/06/23)  Expected discharge location: Undetermined I spent 50 minutes today on this encounter before, during and after the visit, reviewing labs and records, evaluating and examining the patient, entering orders and documenting the visit. Signed:Eraina Winnie Charlanne, MDAttending HospitalistDate: 7/12/2025Time: 6:01 PMAvailable via Mobile Heartbeat

## 2023-09-10 ENCOUNTER — Inpatient Hospital Stay: Admit: 2023-09-10 | Payer: Medicare (Managed Care)

## 2023-09-10 LAB — CBC WITHOUT DIFFERENTIAL
BKR WAM ANC (ABSOLUTE NEUTROPHIL COUNT): 7.95 x 1000/ÂµL — ABNORMAL HIGH (ref 2.00–7.60)
BKR WAM HEMATOCRIT: 30.3 % — ABNORMAL LOW (ref 38.50–50.00)
BKR WAM HEMOGLOBIN: 9.2 g/dL — ABNORMAL LOW (ref 13.2–17.1)
BKR WAM MCH: 23.8 pg — ABNORMAL LOW (ref 27.0–33.0)
BKR WAM MCHC: 30.4 g/dL — ABNORMAL LOW (ref 31.0–36.0)
BKR WAM MCV: 78.5 fL — ABNORMAL LOW (ref 80.0–100.0)
BKR WAM MPV: 9.4 fL (ref 8.0–12.0)
BKR WAM PLATELETS: 331 x1000/ÂµL (ref 150–420)
BKR WAM RDW-CV: 17.3 % — ABNORMAL HIGH (ref 11.0–15.0)
BKR WAM RED BLOOD CELL COUNT.: 3.86 M/ÂµL — ABNORMAL LOW (ref 4.00–6.00)
BKR WAM WHITE BLOOD CELL COUNT: 10.5 x1000/ÂµL (ref 4.0–11.0)

## 2023-09-10 LAB — ALBUMIN/TOTAL PROTEIN     (GH L LMW YH)
BKR A/G RATIO: 0.8 — ABNORMAL LOW (ref 1.0–2.2)
BKR ALBUMIN: 3.2 g/dL — ABNORMAL LOW (ref 3.6–5.1)
BKR GLOBULIN: 3.8 g/dL (ref 2.0–3.9)
BKR PROTEIN TOTAL: 7 g/dL (ref 5.9–8.3)

## 2023-09-10 LAB — BASIC METABOLIC PANEL
BKR ANION GAP: 13 (ref 7–17)
BKR ANION GAP: 13 (ref 7–17)
BKR BLOOD UREA NITROGEN: 45 mg/dL — ABNORMAL HIGH (ref 8–23)
BKR BLOOD UREA NITROGEN: 46 mg/dL — ABNORMAL HIGH (ref 8–23)
BKR BUN / CREAT RATIO: 12.1 (ref 8.0–23.0)
BKR BUN / CREAT RATIO: 12.5 (ref 8.0–23.0)
BKR CALCIUM: 8.6 mg/dL — ABNORMAL LOW (ref 8.8–10.2)
BKR CALCIUM: 8.5 mg/dL — ABNORMAL LOW (ref 8.8–10.2)
BKR CHLORIDE: 103 mmol/L (ref 98–107)
BKR CHLORIDE: 102 mmol/L (ref 98–107)
BKR CO2: 19 mmol/L — ABNORMAL LOW (ref 20–30)
BKR CO2: 20 mmol/L (ref 20–30)
BKR CREATININE DELTA: 0.09
BKR CREATININE DELTA: -0.03
BKR CREATININE: 3.72 mg/dL — ABNORMAL HIGH (ref 0.40–1.30)
BKR CREATININE: 3.69 mg/dL — ABNORMAL HIGH (ref 0.40–1.30)
BKR EGFR, CREATININE (CKD-EPI 2021): 14 mL/min/1.73m2 — ABNORMAL LOW (ref >=60)
BKR EGFR, CREATININE (CKD-EPI 2021): 14 mL/min/1.73m2 — ABNORMAL LOW (ref >=60)
BKR GLUCOSE: 129 mg/dL — ABNORMAL HIGH (ref 70–100)
BKR GLUCOSE: 177 mg/dL — ABNORMAL HIGH (ref 70–100)
BKR POTASSIUM: 5.5 mmol/L — ABNORMAL HIGH (ref 3.3–5.3)
BKR POTASSIUM: 5.1 mmol/L (ref 3.3–5.3)
BKR SODIUM: 135 mmol/L — ABNORMAL LOW (ref 136–144)
BKR SODIUM: 135 mmol/L — ABNORMAL LOW (ref 136–144)

## 2023-09-10 LAB — PHOSPHORUS     (BH GH L LMW YH): BKR PHOSPHORUS: 4.2 mg/dL (ref 2.2–4.5)

## 2023-09-10 LAB — MAGNESIUM
BKR MAGNESIUM: 2.1 mg/dL (ref 1.7–2.4)
BKR MAGNESIUM: 2 mg/dL (ref 1.7–2.4)

## 2023-09-10 NOTE — Other
 Urologic Surgery Consult NoteReason For Consult: hematuriaRequesting Attending: Nicolas Maude HERO., MDUrology Attending: Dr. Ami Lade:  Mark [name] is a 89 y.o. male with a past medical history of heart failure, CAD, hypertension, hyperlipidemia, AAA, BPH with a chronic Foley catheter, CKD stage 4, presenting with a small pulmonary embolism on Eliquis  incidentally found sclerotic lesion of the left femur and left side ribs in the setting of a PSA of 140 with concern for possible prostate cancer.[place] scan showing stable moderate to severe hydroureteronephrosis, decompress thick-walled urinary bladder, ill-defined sclerotic lesion of the left femur and subtle sclerotic lesions of the left ribs.Hydronephrosis visible on [place] scan since March of this year and has slightly worsened.  It was not visible on previous [place] scans.The patient reports hematuria with clot which started three days ago.  Urine culture from 09/03/2023 growing Pseudomonas susceptible to ceftazidime and Zosyn .  The patient has been started on Zosyn .  Hematuria has been stable since then.Patient denies any family history of GU malignancy.  He is a former smoker.On evaluation, he is afebrile and hemodynamically stable. The patient's labs are notable for a Cr 3.72 (baseline between 3 and 4), WBC 8 and Hgb 7 from 8 yesterday evening.  PSA 140The existing 14 Fr foley catheter was exchanged for 22Fr 3 way hematuria coude and manually irrigated for 0 ccs of clot.  Urine clear to clear pink after irrigation 1 time with 60 cc of sterile water .PMH PSH Past Medical History[1] Past Surgical History[2] Social History Family History Social History Tobacco Use  Smoking status: Former   Passive exposure: Never  Smokeless tobacco: Never Substance Use Topics  Alcohol  use: Not Currently  Family History Problem Relation Age of Onset  Heart disease Mother   Current Medications Current Medications[3]  Allergies Ace inhibitors  Review of Systems: A 10 point review of system was conducted that was negative unless noted in the HPI. Objective: Vitals: Temp:  [97.8 ?F (36.6 ?C)-98.7 ?F (37.1 ?C)] 97.8 ?F (36.6 ?C)Pulse:  [81-87] 85Resp:  [20] 20BP: (109-128)/(63-70) 128/70SpO2:  [96 %-97 %] 96 %Device (Oxygen Therapy): room airIntake/Output: I/O last 3 completed shifts:In: - Out: 725 [Urine:725] PHYSICAL EXAMPatient exam or treatment required medical chaperone.The sensitive parts of the examination were performed with chaperone present: RNGen: NADCV: RRPulm: breathing comfortably on RAAbd: soft, ND, NTGU:  Foley catheter draining translucent red urine without clots.Neurological:  Patient has intact sensation throughout the perineum, over the penis and over the scrotum and over the anus.  Patient has a intact rectal tone with DRE.  Positive bulbocavernosus reflex.  5/5 strength bilateral lower extremities.  Intact patellar reflexes bilaterally.LABS/RADIOLOGY:CBCLab Results Component Value Date  WBC 8.0 09/08/2023  HGB 7.0 (L) 09/08/2023  HCT 23.10 (L) 09/08/2023  PLT 319 09/08/2023 ELECTROLYTESLab Results Component Value Date  NA 136 09/08/2023  K 5.2 09/08/2023  CL 103 09/08/2023  CO2 20 09/08/2023  CREATININE 3.72 (H) 09/08/2023  BUN 46 (H) 09/08/2023  GLU 116 (H) 09/08/2023  CALCIUM  8.0 (L) 09/08/2023  MG 2.1 09/04/2023  PHOS 3.3 09/04/2023 URINALYSISLab Results Component Value Date  LABURIN 50,000-99,000 CFU/mL Pseudomonas aeruginosa (A) 09/03/2023  LABURIN No Growth 07/23/2023  LABURIN >=100,000 CFU/mL Proteus vulgaris (A) 07/22/2023 IMAGINGXR Chest PA and LateralResult Date: 09/07/2023 Lung nodules are better seen on chest . Follow-up is recommended in 6 months for 8mm nodule per Fleischner Society guidelines.  [facility] Notify System Classification: Routine. Reported and signed by: Toy Barley, MD  [facility] Radiology and Biomedical Imaging Assessment/Recommendations  Mark [name] is a 89 y.o.  male with a past medical history of heart failure, CAD, hypertension, hyperlipidemia, AAA, BPH with a chronic Foley catheter, CKD stage 4, presenting with a small pulmonary embolism on Eliquis  incidentally found sclerotic lesion of the left femur and left side ribs in the setting of a PSA of 140 with concern for possible prostate cancer.  Patient also now with hematuria for the past several days with translucent red urine and small clots.  He also had a slight drop in hemoglobin from 8 to 7.  Furthermore, patient has persistent left-sided hydronephrosis unchanged from previous [place] scan and present since at least March of 2025.  His creatinine is about baseline (though baseline is difficult to determine given his CKD) and he does not show any signs of pyelonephritis (afebrile, hemodynamically stable, no CVA tenderness). #hematuria Hematuria of this extent unlikely to cause significant changes in patient hemoglobin.  Hematuria may be related to his urinary tract infection versus his likely prostatic malignancy.  However, differential diagnosis of hematuria is broadened patient will require eventual hematuria workup on an outpatient setting.- No acute urologic intervention- please obtain bladder ultrasound (versus [place] scan) to assess clot burden in the bladder.  If ultrasound is to be obtained please clamp Foley catheter around 1 hour prior to ultrasound to adequately distend the bladder.- No indication for CBI at this time- maintain chronic Foley catheter- Trend H/H- Anticoagulation/antiplatelets per primary team- Bowel regimen- Call urology with questions or if hematuria worsens#hydronephrosis Etiology of hydronephrosis is not entirely clear but maybe related to ongoing possible prostatic malignancy.  However, patient currently has no signs of obstructive pyelonephritis given the patient has been afebrile and hemodynamically stable and has no CVA tenderness or flank pain.  Therefore, no acute urological interventions for hydronephrosis at this time.- no acute urological interventions- trend creatinine- monitor urine output- if patient develops signs of obstructive pyelonephritis on the side of the hydronephrosis, significantly rising creatinine, significant pain on the side of the hydronephrosis please reengage Urology at that time#concern for prostate malignancy Patient has elevated PSA and sclerotic bone lesions which are concerning for potential metastatic prostatic malignancy.  Appreciate medical oncology involvement and agree with consideration of starting bicalutamide  if within goals of care.  - would recommend biopsy of metastatic lesions (by Interventional Radiology versus [facility] Rad[facility]) as this would establish metastatic disease in 1 procedure rathar than needing two procedures (prostate biopsy and biopsy of malignant lesions).- recommending spinal imaging of entire spinal column from cervical to sacral to assess for metastatic disease- no neurological symptoms or concern for cord compression at this time on our assessment.  However, if concern for malignant compression of the spinal cord arises please immediately consult Orthopedic spine/radiation oncology for urgent/emergent assessment. Discussed with Dr. Ami CavalloSigned:Leodis Alcocer F Kayven Aldaco, MDUrology ResidentPlease utilize MHB dynamic role for all questionsONeCall for nights (6a-6p) and weekends [1] Past Medical History:Diagnosis Date  (HFpEF) heart failure with preserved ejection fraction (HC Code)    AAA (abdominal aortic aneurysm) (HC Code) 05/05/2015  Formatting of this note might be different from the original.  2016 3.6 cm US  in [place], 2018 4 cm, no further f/u recommended by Vasc    Allergic rhinitis 07/24/2019  Bilateral hearing loss 01/28/2019  BPH (benign prostatic hyperplasia)   CAD (coronary artery disease) 05/05/2015  Carotid artery stenosis 06/07/2016  Chronic coronary artery disease   Chronic sinusitis 07/24/2019  CKD (chronic kidney disease) stage 4, GFR 15-29 ml/min (HC Code)    Gastroesophageal reflux disease without esophagitis 05/05/2015  GERD (gastroesophageal reflux  disease)   History of coronary artery stent placement 06/07/2016  Hyperlipidemia   Hypertension   Microcytic anemia   Mixed hyperlipidemia 07/24/2019  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 [3] Current Facility-Administered Medications:   acetaminophen  (TYLENOL ) tablet 650 mg, 650 mg, Oral, Q6H PRN, Al Mouslmani, Mhd Yassin, MD, 650 mg at 09/06/23 1126  apixaban  (ELIQUIS ) tablet 10 mg, 10 mg, Oral, Q12H, 10 mg at 09/08/23 0951 **FOLLOWED BY** [START ON 09/10/2023] apixaban  (ELIQUIS ) tablet 5 mg, 5 mg, Oral, Q12H, Al Mouslmani, Mhd Yassin, MD  [Held by provider] aspirin  EC delayed release tablet 81 mg, 81 mg, Oral, Daily, Al Mouslmani, Mhd Yassin, MD  clonazePAM  (KlonoPIN ) disintegrating tablet 0.25 mg, 0.25 mg, Oral, DAILY PRN, Al Mouslmani, Mhd Yassin, MD, 0.25 mg at 09/06/23 2056  clopidogreL  (PLAVIX ) tablet 75 mg, 75 mg, Oral, Daily, Al Mouslmani, Mhd Yassin, MD, 75 mg at 09/08/23 0951  escitalopram  oxalate (LEXAPRO ) tablet 15 mg, 15 mg, Oral, Daily, Al Mouslmani, Mhd Yassin, MD, 15 mg at 09/08/23 0951  gabapentin  (NEURONTIN ) capsule 200 mg, 200 mg, Oral, Nightly, Al Mouslmani, Mhd Yassin, MD, 200 mg at 09/07/23 2050  HYDROcodone -acetaminophen  (NORCO) 5-325 mg per tablet 1 tablet, 1 tablet, Oral, Q6H PRN, Al Mouslmani, Mhd Yassin, MD, 1 tablet at 09/07/23 0914  lidocaine  4 % topical patch 1 patch, 1 patch, Transdermal, Q24H, Raliegh, Summer, APRN, 1 patch at 09/07/23 1134  melatonin tablet 6 mg, 6 mg, Oral, Nightly PRN, Al Mouslmani, Mhd Yassin, MD, 6 mg at 09/06/23 2056  polyethylene glycol (MIRALAX ) packet 17 g, 17 g, Oral, BID PRN, Al Mouslmani, Mhd Yassin, MD, 17 g at 09/07/23 1528  rosuvastatin  (CRESTOR ) tablet 40 mg, 40 mg, Oral, Daily, Al Mouslmani, Mhd Yassin, MD, 40 mg at 09/08/23 0951  sodium bicarbonate  tablet 650 mg, 650 mg, Oral, TID, Al Mouslmani, Mhd Yassin, MD, 650 mg at 09/08/23 0951  sodium chloride  0.9 % flush 3 mL, 3 mL, IV Push, Q8H, Al Mouslmani, Mhd Yassin, MD, 3 mL at 09/07/23 1357  sodium chloride  0.9 % flush 3 mL, 3 mL, IV Push, PRN for Line Care, Al Mouslmani, Mhd Yassin, MD, 3 mL at 09/07/23 0915  Tobramycin  Therapy Placeholder, , Intravenous, placeholder, Al Mouslmani, Mhd Yassin, MD

## 2023-09-10 NOTE — Progress Notes
 [name]. CampusHospital Medicine Progress NoteAttending Provider: Lucendia Bring, MD  Location:  8830/8830-CHospital Day #5Subjective   I saw and examined Mark [name] (DOB: [birth year], [id number]) on 09/10/2023.Principal problem: Syncope anginosa (HC Code)Interim History: S/p 1unit RBC yesterday. Continues to have hematuria. Pt offers no new complaints today.  Objective  Vitals:Temp:  [97.3 ?F (36.3 ?C)-97.6 ?F (36.4 ?C)] 97.3 ?F (36.3 ?C)Pulse:  [71-88] 71Resp:  [20] 20BP: (96-161)/(54-74) 121/68SpO2:  [96 %-99 %] 99 %Device (Oxygen Therapy): room air Wt Readings from Last 2 Encounters: 09/03/23 82 kg 07/22/23 82 kg Body mass index is 25.21 kg/m?.I/O's:Intake/Output Summary (Last 24 hours) at 09/10/2023 1420Last data filed at 09/10/2023 0600Gross per 24 hour Intake 360 ml Output 1525 ml Net -1165 ml  Physical ExamConstitutional:     General: He is not in acute distress.   Appearance: Normal appearance. Cardiovascular:    Rate and Rhythm: Normal rate and regular rhythm.    Heart sounds: Murmur heard. Pulmonary:    Effort: Pulmonary effort is normal. No respiratory distress.    Breath sounds: Normal breath sounds. Abdominal:    Palpations: Abdomen is soft.    Tenderness: There is no abdominal tenderness. There is no guarding. Genitourinary:   Comments: Foley inserted outputting dark red urineNeurological:    Mental Status: He is alert.  Labs:I have reviewed the patient's pertinent labs as resulted in the EMR.CBC Last 24hrs: WBC/Hgb/Hct/Plts:  10.5/9.2/30.30/331 (07/13 0552)Lytes Last 24hrs: Na/K/Cl/CO2:  135/5.5/103/19 (07/13 0552)Chem Last 24hrs: BUN/Cr/GLU/ALT/AST/AMYLASE/LIPASE:  45/3.72/129/--/--/--/-- (07/13 0552)Peripheral Access:Periph IV 09/03/23 cephalic (thumb side), left over-the-needle catheter system 18 gauge Paramedic (Active)  Foley Catheter:UreTHral Catheter 09/08/23 22 Physician (Active) Foley order placed.Imaging:US  Pelvis LimitedResult Date: 7/12/2025US PELVIS LIMITED HISTORY: Hematuria w/u. COMPARISON: US  RENAL 2022-12-19. TECHNIQUE: Grayscale imaging were performed. FINDINGS: Urinary bladder is completely collapsed by a Foley catheter, limiting evaluation (Per provider, catheter was clamped 6 hours prior to the study).  Urinary bladder is completely collapsed by a Foley catheter, significantly limiting evaluation and rendering study nondiagnostic. [facility] Notify System Classification: Routine. Reported and signed by: Dorn Norman, MD  [facility] Radiology and Biomedical Imaging  I reviewed lab results, imaging, and test results in formulating this patient's plan of care.   Assessment: 89 y.o. male with PMH significant for HFpEF, CAD s/p PCI (2019), BPH s/p chronic indwelling urinary catheter, CKD4, hearing loss, GERD, HTN, HLD, and AAA was admitted to [facility] for management of a small segmental PE after presenting on 09/03/23 for evaluation of chest pain. Found to have possible UTI as well as L femur and L rib sclerotic lesions and elevated PSA c/f prostate malignancy for which Urology and Oncology are consulting.  Plan: # Possible metastatic prostate cancerAs evident by sclerotic lesions on  and XR imaging. PSA 140. Oncology and Urology consulting. - Family and patient currently deciding whether to continue with biopsy and systemic care or alternatively consider a more palliative approach# Hematuria# B/L moderate-severe hydroureteronephrosis# Possible UTIDue to UTI vs prostate cancer. Urology consultingUcx with pseudomonas susceptible to tobra- bladder US : decompressed urinary bladder- continue foley catetherization- trend H/h- no urologic interventions planned at this time- s/p tobramycin  dose on 7/6 (no additional dosing needed due to renal clearance for pharmacy)# Hyperkalemia# AKI on CKD vs progressive CKD4- check EKG to determine need for calcium - repeat BMP/Mg- lokelma  +/- insulin  with dextrose  if K rises# Acute on chronic microcytic anemiaS/p 1 unit RBC transfusion on 7/11 with robust response (Hgb 8-> 6.9 -> 9.3)- trend CBC, transfuse for goal Hgb >7# Acute small segmental PE (intermediate-low risk)-  continue apixaban  load today then maintenance tomorrow (holding ASA)# Neck painSuspected to be MSK etiology / positional- lidocaine  patch daily# CAD s/p PCI x2 (2019)- stop plavix  given hematuria and concomitant AC- continue ASA 81mg  daily- continue statin  Comorbidities Comorbidities present on admission:Chronic Heart Failure with preserved ejection fraction, last EF: 65% 05/10/2023. CKD Stage 5 (GFR<15), 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)Acute/Chronic AnemiaPMH of COVID  Secondary diagnoses occurring during hospitalization:HypocalcemiaHyponatremiaHyperkalemia Diet: Diet CardiacVTE Prophylaxis:  apixaban  (ELIQUIS ) tablet 5 mg  Code Status: Full CodeDisposition: Continue inpatient careMedication Reconciliation: Completed: by Pharmacist Communication: RNDischarge Readiness: Expected Discharge Date: 09/11/23 AM-PAC (RN/PT): 13 / 12 Physical Therapy Disposition Recommendation: Moderate complexity (09/06/23)  Expected discharge location: Undetermined Medical decision making for this patient was high.Signed:Elandra Powell Charlanne, MDAttending HospitalistDate: 7/13/2025Time: 2:28 PMAvailable via Mobile Heartbeat

## 2023-09-10 NOTE — Plan of Care
 Problem: Adult Inpatient Plan of CareGoal: Plan of Care ReviewOutcome: Interventions implemented as appropriate Plan of Care Overview/ Patient StatusPt calm and cooperative. HOH. Watching tv. Swallows meds whole. Red urine in foley catheter. Foley care done. Pt continent of stool. Bed pan used with loose brown stool. Barrier cream applied to bottom and L groin. Call light within reach, bed alarm on. C/o back and neck pain. PRN oxy given.0530 - pt had episode of emesis. Pt tolerated being cleaned up with bed linen and gown changed. Tylenol  given for headache. Pt appears to be resting in bed with eyes closed. No distress noted.

## 2023-09-10 NOTE — Plan of Care
 Problem: Adult Inpatient Plan of CareGoal: Plan of Care ReviewOutcome: Interventions implemented as appropriateGoal: Patient-Specific Goal (Individualized)Outcome: Interventions implemented as appropriateGoal: Absence of Hospital-Acquired Illness or InjuryOutcome: Interventions implemented as appropriateGoal: Optimal Comfort and WellbeingOutcome: Interventions implemented as appropriateGoal: Readiness for Transition of CareOutcome: Interventions implemented as appropriate Plan of Care Overview/ Patient Status0700-1900Pt is A&Ox2-3 to self, place, & situation. On RA, no telemetry. Had c/o headache this evening, PRN Tylenol  adm w/ moderate effect. HOH. Cardiac diet, takes pills whole. Barrier cream applied to stage 2 on coccyx & excoriation to groin. Continent of bowels, last BM was one day ago. Foley in place & draining bloody urine. Assist x1 in bed. Assist x1 OOB w/ RW, BA on. 18g to LUE is patent & flushed w/o difficulties. Safety & comfort measures maintained, call light within reach.

## 2023-09-11 LAB — CBC WITH AUTO DIFFERENTIAL
BKR WAM ABSOLUTE IMMATURE GRANULOCYTES.: 0.04 x 1000/ÂµL (ref 0.00–0.30)
BKR WAM ABSOLUTE LYMPHOCYTE COUNT.: 1.12 x 1000/ÂµL (ref 0.60–3.70)
BKR WAM ABSOLUTE NRBC: 0 x 1000/ÂµL (ref 0.00–1.00)
BKR WAM ANC (ABSOLUTE NEUTROPHIL COUNT): 6.58 x 1000/ÂµL (ref 2.00–7.60)
BKR WAM BASOPHIL ABSOLUTE COUNT.: 0.03 x 1000/ÂµL (ref 0.00–1.00)
BKR WAM BASOPHILS: 0.3 % (ref 0.0–1.4)
BKR WAM EOSINOPHIL ABSOLUTE COUNT.: 0.44 x 1000/ÂµL (ref 0.00–1.00)
BKR WAM EOSINOPHILS: 4.8 % (ref 0.0–5.0)
BKR WAM HEMATOCRIT: 27.9 % — ABNORMAL LOW (ref 38.50–50.00)
BKR WAM HEMOGLOBIN: 8.6 g/dL — ABNORMAL LOW (ref 13.2–17.1)
BKR WAM IMMATURE GRANULOCYTES: 0.4 % (ref 0.0–1.0)
BKR WAM LYMPHOCYTES: 12.3 % — ABNORMAL LOW (ref 17.0–50.0)
BKR WAM MCH: 23.7 pg — ABNORMAL LOW (ref 27.0–33.0)
BKR WAM MCHC: 30.8 g/dL — ABNORMAL LOW (ref 31.0–36.0)
BKR WAM MCV: 76.9 fL — ABNORMAL LOW (ref 80.0–100.0)
BKR WAM MONOCYTE ABSOLUTE COUNT.: 0.87 x 1000/ÂµL (ref 0.00–1.00)
BKR WAM MONOCYTES: 9.6 % (ref 4.0–12.0)
BKR WAM MPV: 10.1 fL (ref 8.0–12.0)
BKR WAM NEUTROPHILS: 72.6 % — ABNORMAL HIGH (ref 39.0–72.0)
BKR WAM NUCLEATED RED BLOOD CELLS: 0 % (ref 0.0–1.0)
BKR WAM PLATELETS: 300 x1000/ÂµL (ref 150–420)
BKR WAM RDW-CV: 17.3 % — ABNORMAL HIGH (ref 11.0–15.0)
BKR WAM RED BLOOD CELL COUNT.: 3.63 M/ÂµL — ABNORMAL LOW (ref 4.00–6.00)
BKR WAM WHITE BLOOD CELL COUNT: 9.1 x1000/ÂµL (ref 4.0–11.0)

## 2023-09-11 LAB — BASIC METABOLIC PANEL
BKR ANION GAP: 13 (ref 7–17)
BKR BLOOD UREA NITROGEN: 43 mg/dL — ABNORMAL HIGH (ref 8–23)
BKR BUN / CREAT RATIO: 12.1 (ref 8.0–23.0)
BKR CALCIUM: 8.4 mg/dL — ABNORMAL LOW (ref 8.8–10.2)
BKR CHLORIDE: 102 mmol/L (ref 98–107)
BKR CO2: 20 mmol/L (ref 20–30)
BKR CREATININE DELTA: -0.14
BKR CREATININE: 3.55 mg/dL — ABNORMAL HIGH (ref 0.40–1.30)
BKR EGFR, CREATININE (CKD-EPI 2021): 15 mL/min/1.73m2 — ABNORMAL LOW (ref >=60)
BKR GLUCOSE: 106 mg/dL — ABNORMAL HIGH (ref 70–100)
BKR POTASSIUM: 5.5 mmol/L — ABNORMAL HIGH (ref 3.3–5.3)
BKR SODIUM: 135 mmol/L — ABNORMAL LOW (ref 136–144)

## 2023-09-11 LAB — PHOSPHORUS     (BH GH L LMW YH): BKR PHOSPHORUS: 4.5 mg/dL (ref 2.2–4.5)

## 2023-09-11 LAB — MAGNESIUM: BKR MAGNESIUM: 2 mg/dL (ref 1.7–2.4)

## 2023-09-11 MED ORDER — ONDANSETRON HCL (PF) 4 MG/2 ML INJECTION SOLUTION
4 | Freq: Once | INTRAVENOUS | Status: CP
Start: 2023-09-11 — End: ?
  Administered 2023-09-11: 05:00:00 4 mL via INTRAVENOUS

## 2023-09-11 MED ORDER — SODIUM ZIRCONIUM CYCLOSILICATE 5 GRAM ORAL POWDER PACKET
5 | Freq: Once | ORAL | Status: CP
Start: 2023-09-11 — End: ?
  Administered 2023-09-11: 10:00:00 5 g via ORAL

## 2023-09-11 NOTE — Progress Notes
 [name]	 [facility]	Daily Progress NoteHospital Medicine ServiceAttending Provider: Nicolas Maude HERO., MD [phone]:  8830/8830-CHospital Day #6 Subjective:  Patient seen and examined on 09/11/2023.CC: Syncope anginosa (HC Code)Interim History: Chart reviewed. Seen and examined at bedside.Feeling well with no new complaints today. Urology also at bedside. Objective: Vitals:Temp:  [97.7 ?F (36.5 ?C)-98.7 ?F (37.1 ?C)] 98.7 ?F (37.1 ?C)Pulse:  [73-80] 74Resp:  [18-20] 18BP: (125-151)/(63-75) 125/63SpO2:  [97 %-100 %] 97 %Device (Oxygen Therapy): room airPhysical Exam CONSTITUTIONAL: NAD.HENT: NC/AT. Moist mucous membranes.EYES: Anicteric. EOMI.  CV: RRR. PULM: Normal respiratory effort. CTA b/l. ABDOMEN: Normoactive bowel sounds. Soft, non-distended, non-tender. GU: Foley draining dark red urine MSK: Gross movement intact. No LE edema. SKIN: Warm and dry.  NEURO: AOx3. Grossly intact.PSYCH: Normal mood and behavior. Peripheral Access:Periph IV 09/03/23 cephalic (thumb side), left over-the-needle catheter system 18 gauge Paramedic (Active)  Foley Catheter:UreTHral Catheter 09/08/23 22 Physician (Active) Foley order placed.I/O's:Intake/Output Summary (Last 24 hours) at 09/11/2023 1530Last data filed at 09/11/2023 1400Gross per 24 hour Intake -- Output 2200 ml Net -2200 ml Diagnostics:I reviewed lab results in formulating this patient's plan of care.Recent Results (from the past 24 hours) Magnesium   Collection Time: 09/10/23  3:52 PM Result Value Ref Range  Magnesium  2.0 1.7 - 2.4 mg/dL Basic metabolic panel  Collection Time: 09/10/23  3:52 PM Result Value Ref Range  Sodium 135 (L) 136 - 144 mmol/L  Potassium 5.1 3.3 - 5.3 mmol/L  Chloride 102 98 - 107 mmol/L  CO2 20 20 - 30 mmol/L  Anion Gap 13 7 - 17  Glucose 177 (H) 70 - 100 mg/dL  BUN 46 (H) 8 - 23 mg/dL  Creatinine 6.30 (H) 9.59 - 1.30 mg/dL  Calcium  8.5 (L) 8.8 - 10.2 mg/dL  BUN/Creatinine Ratio 87.4 8.0 - 23.0  eGFR (Creatinine) 14 (L) >=60 mL/min/1.69m2  Creatinine Delta -0.03 See Comment EKG  Collection Time: 09/10/23  4:06 PM Result Value Ref Range  Heart Rate 71 bpm  QRS Interval 92 ms  QT Interval 422 ms  QTC Interval 458 ms  P Axis 79 deg  QRS Axis -24 deg  T Wave Axis 44 deg  P-R Interval 214 msec  SEVERITY Borderline ECG severity Magnesium   Collection Time: 09/11/23  5:55 AM Result Value Ref Range  Magnesium  2.0 1.7 - 2.4 mg/dL Phosphorus     (BH GH L LMW YH)  Collection Time: 09/11/23  5:55 AM Result Value Ref Range  Phosphorus 4.5 2.2 - 4.5 mg/dL CBC auto differential  Collection Time: 09/11/23  5:55 AM Result Value Ref Range  WBC 9.1 4.0 - 11.0 x1000/?L  RBC 3.63 (L) 4.00 - 6.00 M/?L  Hemoglobin 8.6 (L) 13.2 - 17.1 g/dL  Hematocrit 72.09 (L) 61.49 - 50.00 %  MCV 76.9 (L) 80.0 - 100.0 fL  MCH 23.7 (L) 27.0 - 33.0 pg  MCHC 30.8 (L) 31.0 - 36.0 g/dL  RDW-CV 82.6 (H) 88.9 - 15.0 %  Platelets 300 150 - 420 x1000/?L  MPV 10.1 8.0 - 12.0 fL  Neutrophils 72.6 (H) 39.0 - 72.0 %  Lymphocytes 12.3 (L) 17.0 - 50.0 %  Monocytes 9.6 4.0 - 12.0 %  Eosinophils 4.8 0.0 - 5.0 %  Basophil 0.3 0.0 - 1.4 %  Immature Granulocytes 0.4 0.0 - 1.0 %  nRBC 0.0 0.0 - 1.0 %  Absolute Lymphocyte Count 1.12 0.60 - 3.70 x 1000/?L  Monocyte Absolute Count 0.87 0.00 - 1.00 x 1000/?L  Eosinophil Absolute Count 0.44 0.00 - 1.00 x 1000/?L  Basophil Absolute Count 0.03 0.00 - 1.00 x 1000/?L  Absolute Immature Granulocyte Count 0.04 0.00 - 0.30 x 1000/?L  Absolute nRBC 0.00 0.00 - 1.00 x 1000/?L  ANC (Abs Neutrophil Count) 6.58 2.00 - 7.60 x 1000/?L Basic metabolic panel  Collection Time: 09/11/23  5:55 AM Result Value Ref Range  Sodium 135 (L) 136 - 144 mmol/L  Potassium 5.5 (H) 3.3 - 5.3 mmol/L Chloride 102 98 - 107 mmol/L  CO2 20 20 - 30 mmol/L  Anion Gap 13 7 - 17  Glucose 106 (H) 70 - 100 mg/dL  BUN 43 (H) 8 - 23 mg/dL  Creatinine 6.44 (H) 9.59 - 1.30 mg/dL  Calcium  8.4 (L) 8.8 - 10.2 mg/dL  BUN/Creatinine Ratio 87.8 8.0 - 23.0  eGFR (Creatinine) 15 (L) >=60 mL/min/1.85m2  Creatinine Delta -0.14 See Comment Recent Labs   07/13/250552 07/13/251552 07/14/250555 GLU 129* 177* 106* Lab Results Component Value Date  WBC 9.1 09/11/2023  HGB 8.6 (L) 09/11/2023  HCT 27.90 (L) 09/11/2023  MCV 76.9 (L) 09/11/2023  PLT 300 09/11/2023   Chemistry    Component Value Date/Time  NA 135 (L) 09/11/2023 0555  K 5.5 (H) 09/11/2023 0555  CL 102 09/11/2023 0555  CO2 20 09/11/2023 0555  BUN 43 (H) 09/11/2023 0555  CREATININE 3.55 (H) 09/11/2023 0555  GLU 106 (H) 09/11/2023 0555  PROT 7.0 09/10/2023 0552    Component Value Date/Time  CALCIUM  8.4 (L) 09/11/2023 0555  ALKPHOS 173 (H) 09/04/2023 0535  AST 40 (H) 09/04/2023 0535  ALT 16 09/04/2023 0535  BILITOT 0.3 09/04/2023 0535  ALBUMIN  3.2 (L) 09/10/2023 0552  [place] Head wo IV ContrastResult Date: 7/13/2025No acute intracranial abnormality is seen. Please note that Noncontrast Head [place] is not sensitive for the detection of ischemic infarct. If ischemic infarct is of clinical concern, additional clinical or imaging evaluation is recommended. [name] Radiology Notify System Classification: Routine. Reported and signed by: Cinderella Finical, MD  [name]ical [facility] US  Pelvis LimitedResult Date: 09/09/2023 Urinary bladder is completely collapsed by a Foley catheter, significantly limiting evaluation and rendering study nondiagnostic. [place] Radiology Notify System Classification: Routine. Reported and signed by: Dorn Norman, MD  [name] XR Chest PA and LateralResult Date: 09/07/2023 Lung nodules are better seen on chest Interior. Follow-up is recommended in 6 months for 8mm nodule per Fleischner Society guidelines.  [facility] Radiology Notify System Classification: Routine. Reported and signed by: Toy Barley, MD  [facility] Radiology and Biomedical Imaging Assessment: 89 y.o. male PMHx significant for HFpEF, CAD s/p PCI (2019), BPH s/p chronic indwelling urinary catheter, CKD4, hearing loss, GERD, HTN, HLD, and AAA was admitted to [name] for management of a small segmental PE after presenting on 09/03/23 for evaluation of chest pain. Found to have possible UTI as well as L femur and L rib sclerotic lesions and elevated PSA c/f prostate malignancy for which urology and oncology are following.Plan: #Possible metastatic prostate cancer- Sclerotic lesions seen on [place] and XR imaging. PSA 140. - Oncology and Urology following.- Family and patient currently deciding whether to continue with biopsy and systemic care or alternatively consider a more palliative approach. Today, patient reports he is still hesitant about pursing biopsy.SABRA #Hematuria#B/L moderate-severe hydroureteronephrosis#Possible UTI- Due to UTI vs prostate cancer. Urology consulting- Ucx with pseudomonas susceptible to tobra- Bladder US : decompressed urinary bladder- Continue foley catheterization- No urologic interventions planned at this time- S/p tobramycin  dose on 7/6 (no additional dosing needed due to renal clearance for pharmacy) #Hyperkalemia#AKI on CKD vs progressive  CKD4-Lokelma  +/- insulin  with dextrose  if K rises #Acute on chronic microcytic anemia- S/p 1 unit RBC transfusion on 7/11 with robust response (Hgb 8-> 6.9 -> 9.3)- Trend CBC, transfuse for goal Hgb >7 # Acute small segmental PE (intermediate-low risk)- Continue apixaban  (holding ASA) # Neck pain- Suspected to be MSK etiology / positional- Continue lidocaine  patch daily #CAD s/p PCI x2 (2019)- Stop plavix  given hematuria and concomitant AC- Continue ASA 81mg  daily- Continue statin  Comorbidities Comorbidities present on admission:Chronic Heart Failure with preserved ejection fraction, last EF: 65% 05/10/2023. CKD Stage 5 (GFR<15), 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)Acute/Chronic AnemiaPMH of COVID  Secondary diagnoses occurring during hospitalization:HyperkalemiaHypocalcemiaHyponatremia Diet Diet Cardiac Code Status Full Code VTE Prophylaxis  apixaban  (ELIQUIS ) tablet 5 mg  Med Reconciliation Completed: by Pharmacist  Communication RN, Consultants, and Case Management PCP Josepha Rush [phone] Discharge Readiness: Expected discharge timeframe: UndeterminedExpected Discharge Date: 09/13/23 AM-PAC (RN/PT): AMPAC Mobility Score: 13AMPAC Mobility Score: 9Physical Therapy Disposition Recommendation: Moderate complexity (09/11/23)  Expected discharge location: UndeterminedMedical decision making for this patient was moderate. Signed:Val Schiavo Mahlon, PA-CHospitalist Medicine Can be reached via Mobile [facility]7/14/2025

## 2023-09-11 NOTE — Plan of Care
 Problem: Adult Inpatient Plan of CareGoal: Plan of Care ReviewOutcome: Interventions implemented as appropriate Plan of Care Overview/ Patient StatusPt calm and cooperative. HOH. Watching tv. Swallows meds whole. Red urine in foley catheter. Foley care done. Barrier cream applied to bottom and L groin. Call light within reach, bed alarm on. Repostioned in bed.2000 - pt confused saying he hit his head on cabinet in room earlier in the day even though has been bedrest during day. pt c/o 7/10 headache. Tylenol  was given earlier at 1807. Pt asking for head [place] scan. Provider notified of early morning nausea and emesis and report of headaches throughout the day, and request of [place] scan. Provider ordered [place] scan and pt taken down for head [place] scan. Pt appears to be sleeping after returning for scan. No distress noted.100 - pt had episode of nausea and watery spit. Provider notified and zofran  ordered and given. States headache not bad 5/10.  Pt tolerated being cleaned up with bed linen and gown changed. Pt appears to be resting in bed with eyes closed. No distress noted.

## 2023-09-11 NOTE — Progress Notes
 Urology Progress NoteHospital Day: 9Subjective/Interim Events- Has remained off CBI - Hb 8.6 (9.2)- UOP 1600 - Bladder US  did not show significant clot burden - Cr 3.55 (3.69; baseline) - Urine remains translucent red; catheter draining without issues - Spinal imaging has not occurred yet - Ucx with pseudomonas Objective:Vital signs in last 24 hours:Temp:  [97.3 ?F (36.3 ?C)-98.2 ?F (36.8 ?C)] 98 ?F (36.7 ?C)Pulse:  [71-80] 80Resp:  [20] 20BP: (121-151)/(68-75) 151/70SpO2:  [98 %-100 %] 98 %Device (Oxygen Therapy): room airI/O last 3 completed shifts:In: - Out: 1600 [Urine:1600]Physical Exam:Physical ExamGen: sleeping in bedCV: RRPulm: breathing comfortably on RAAbd: soft, ND, NTGU: foley with clear red urineLabs:WBC/Hgb/Hct/Plts:  9.1/8.6/27.90/300 (07/14 0555)Na/K/Cl/CO2:  135/5.5/102/20 (07/14 0555)BUN/Cr/GLU/ALT/AST/AMYLASE/LIPASE:  43/3.55/106/--/--/--/-- (07/14 0555)Imaging:[place] Head wo IV ContrastResult Date: 7/13/2025No acute intracranial abnormality is seen. Please note that Noncontrast Head [place] is not sensitive for the detection of ischemic infarct. If ischemic infarct is of clinical concern, additional clinical or imaging evaluation is recommended. [facility] Radiology Notify System Classification: Routine. Reported and signed by: Cinderella Finical, MD  [facility] Radiology and Biomedical Imaging Assessment/Plan:Mark [name] is a 89 y.o. male, with PMH of  heart failure, CAD, hypertension, hyperlipidemia, AAA, BPH with a chronic Foley catheter, CKD stage 4, presenting with a small pulmonary embolism on Eliquis  incidentally found sclerotic lesion of the left femur and left side ribs in the setting of a PSA of 140 with concern for possible prostate cancer as well as hematuria. Hematuria likely 2/2 to suspected prostate Ca and UTI.  However, differential diagnosis of hematuria is broad and patient will require eventual hematuria workup on an outpatient setting.- No indication for CBI - would treat positive Ucx (pseudomonas) with 14 days culture directed abx as UTI can cause hematuria - Maintain foley catheter - No indication for CBI at this time- maintain chronic Foley catheter- Trend H/H- Trend cr - Monitor UOP - Anticoagulation/antiplatelets per primary team- Bowel regimen- Call urology with questions or if hematuria worsens or catheter is not draining - if patient develops signs of obstructive pyelonephritis on the side of the hydronephrosis, significantly rising creatinine, significant pain on the side of the hydronephrosis please reengage Urology at that time- Please acquire spinal imaging for assessment of malignant sites Zachary JULIANNA Reveal, MDUrology ResidentPlease utilize MHB dynamic role for all questionsONeCall for nights (6a-6p) and weekends

## 2023-09-11 NOTE — Plan of Care
 Inpatient Physical Therapy Progress Note IP Adult PT Eval/Treat - 09/11/23 1143    Date of Visit / Treatment  Date of Visit / Treatment 09/11/23   Note Type Progress Note   Start Time 1120   End Time 1143   Total Treatment Time 23    General Information  Subjective Pt agreeable to session   General Observations Supine in bed on 8-8, RA, foley, NAD. RN cleared for session   Precautions/Limitations Fall Precautions;Bed alarm;Chair alarm   Precautions/Limitations Comment HOH    Vital Signs and Orthostatic Vital Signs  Vital Signs Vital Signs Stable   Vital Signs Free text RA, NAD, per chart.   Vital Signs: Pre, During, & Post treatment Post Vitals      Post Treatment BP 96/56   Pt reporting dizziness sitting edge of bed and continued while supine.  RN aware   Pain/Comfort  Pain Comment (Pre/Post Treatment Pain) Denies pain    Cognition  Overall Cognitive Status Impaired   Orientation Level Oriented to person;Oriented to place   Level of Consciousness alert;confused   Following Commands Follows one step commands with increased time;Follows one step commands with repetition   Personal Safety / Judgment Requires supervision with mobility;Fall risk    Range of Motion  Range of Motion Examination bilateral upper extremity ROM was WFL;bilateral lower extremity ROM was [facility]    Manual Muscle Testing  Manual Muscle Testing Comments Grossly at least 3/5    Muscle Tone  Muscle Tone Testing Results No muscle tone deficits noted    Skin Assessment  Skin Assessment See Nursing Documentation    Balance  Sitting Balance: Static  POOR+    Minimal assist to maintain static position with no Assistive Device   Sitting Balance: Dynamic  POOR+   Moves through 1/2 range with minimal assist to right self   Balance Assist Device Hand held assist    Bed Mobility  Supine-to-Sit Independence/Assistance Level Moderate assist   Supine-to-Sit Assist Device Draw pad;Hand held assist;Head of bed elevated;Bed rails   Sit-to-Supine Independence/Assistance Level Moderate assist   Sit-to-Supine Assist Device Hand held assist    Sit-Stand Transfer Training  Sit-Stand Transfer Comments Deferred standing due to dizziness sitting edge of bed    Handoff Documentation  Handoff Patient in bed;Bed alarm;Patient instructed to call nursing for mobility;Discussed with nursing    Activity Tolerance  Activity Tolerance Comments Poor    PT- AM-PAC - Basic Mobility Screen- How much help from another person do you currently need.....  Turning from your back to your side while in a a flat bed without using rails? 3 - A Little - Requires a little help (supervision, minimal assistance). Can use assistive devices.   Moving from lying on your back to sitting on the side of a flat bed without using bed rails? 2 - A Lot - Requires a lot of help (maximum to moderate assistance). Can use assistive devices.   Moving to and from a bed to a chair (including a wheelchair)? 1 - Total - Requires total assistance or cannot do it at all.   Standing up from a chair using your arms(e.g., wheelchair or bedside chair)? 1 - Total - Requires total assistance or cannot do it at all.   To walk in a hospital room? 1 - Total - Requires total assistance or cannot do it at all.   Climbing 3-5 steps with a railing? 1 - Total - Requires total assistance or cannot do it at all.  AMPAC Mobility Score 9   TARGET Highest Level of Mobility Mobility Level 3, Sit on edge of bed   ACTUAL Highest Level of Mobility Mobility Level 3, Sit on edge of bed    Therapeutic Exercise  Therapeutic Exercise Comments Reviewed and encouraged therex ; BLE therex sitting edge of bed    Neuromuscular Re-education  Neuromuscular Re-education Comments Static/dynamic balance sitting edge of bed    Therapeutic Functional Activity  Therapeutic Functional Activity Comments Bed mobility, transfer training, education on PT role, POC, mobility recommendations and dc planning    Clinical Impression  Follow up Assessment Patient seen for follow up this date. Tolerated session fairly however limtied by dizziness while sitting edge of bed.  Ax1 for transfer to edge of bed and balance.  Therex completed in sitting without improvement in dizziness.  Patient returned to supine and continued to report feeling dizzy.  Bp 96/56, RN notified.  Patient demonstrates impairments in strength, balance, and activity tolerance and will benefit from skilled PT. Recommend dc with moderate complexity support of Ax2 for mobiltiy and skilled PT when medically apropriate.    Frequency/Equipment Recommendations  PT Frequency 3x per week   Next Treatment Expected 09/13/23   PT/PTA completing this assessment Tonia Avino    PT Recommendations for Inpatient Admission  Positioning chair position of the bed   Therapeutic Exercise encourage exercise program issued    Planned Treatment / Interventions  Plan for Next Visit Progress as tolerated   Education Treatment / Interventions Patient Education / Training    PT Discharge Summary  Physical Therapy Disposition Recommendation Moderate complexity support and therapy to progress functional mobility/ ADLs/ IADLs recommended for post- acute care.  See assessment for additional details.   Additional Therapy Recommendations Physical Therapy Services in Discharge Environment    Sherryle Spindle, PT, DPT

## 2023-09-11 NOTE — Plan of Care
 Plan of Care Overview/ Patient Status0700-1900:Neuro: Alert and oriented x 4Respiratory: On RA. No complaints of shortness of breath, chest pain, or s/s of respiratory distress.Cardiovascular: WDL, VSSGI/GU: Indwelling foley catheter intact, hematuria. Foley care provided. No BM during shift, LBM 09/10/23.Mobility: Attempted to work with PT today, patient dizzy sitting at edge of bed. Provider notified.Skin: Intact, blanchable redness to sacrum, barrier cream applied.Access: L#18Diet: Cardiac, fair PO intake. Eating 25-50% of meals. Tray set-up provided.Medications: Whole by mouthPain: No c/o pain however complaining of stiff/sore neck. Lidocaine  patch applied per MAR.Turned and repositioned q2h, pillows in use. Bed alarm on, call bell and personal items within reach. Hourly rounding and safety precautions maintained. Patient on pressure reduction mattress. See flow sheets for additional details.Problem: Adult Inpatient Plan of CareGoal: Plan of Care ReviewOutcome: Interventions implemented as appropriate

## 2023-09-12 ENCOUNTER — Encounter: Admit: 2023-09-12 | Payer: PRIVATE HEALTH INSURANCE | Primary: Internal Medicine

## 2023-09-12 DIAGNOSIS — C61 Malignant neoplasm of prostate: Principal | ICD-10-CM

## 2023-09-12 LAB — BASIC METABOLIC PANEL
BKR ANION GAP: 11 (ref 7–17)
BKR BLOOD UREA NITROGEN: 42 mg/dL — ABNORMAL HIGH (ref 8–23)
BKR BUN / CREAT RATIO: 11.6 (ref 8.0–23.0)
BKR CALCIUM: 8.3 mg/dL — ABNORMAL LOW (ref 8.8–10.2)
BKR CHLORIDE: 104 mmol/L (ref 98–107)
BKR CO2: 20 mmol/L (ref 20–30)
BKR CREATININE DELTA: 0.08
BKR CREATININE: 3.63 mg/dL — ABNORMAL HIGH (ref 0.40–1.30)
BKR EGFR, CREATININE (CKD-EPI 2021): 15 mL/min/1.73m2 — ABNORMAL LOW (ref >=60)
BKR GLUCOSE: 114 mg/dL — ABNORMAL HIGH (ref 70–100)
BKR POTASSIUM: 5.1 mmol/L (ref 3.3–5.3)
BKR SODIUM: 135 mmol/L — ABNORMAL LOW (ref 136–144)

## 2023-09-12 LAB — CBC WITH AUTO DIFFERENTIAL
BKR WAM ABSOLUTE IMMATURE GRANULOCYTES.: 0.04 x 1000/ÂµL (ref 0.00–0.30)
BKR WAM ABSOLUTE LYMPHOCYTE COUNT.: 1.36 x 1000/ÂµL (ref 0.60–3.70)
BKR WAM ABSOLUTE NRBC: 0 x 1000/ÂµL (ref 0.00–1.00)
BKR WAM ANC (ABSOLUTE NEUTROPHIL COUNT): 5.4 x 1000/ÂµL (ref 2.00–7.60)
BKR WAM BASOPHIL ABSOLUTE COUNT.: 0.03 x 1000/ÂµL (ref 0.00–1.00)
BKR WAM BASOPHILS: 0.4 % (ref 0.0–1.4)
BKR WAM EOSINOPHIL ABSOLUTE COUNT.: 0.46 x 1000/ÂµL (ref 0.00–1.00)
BKR WAM EOSINOPHILS: 5.6 % — ABNORMAL HIGH (ref 0.0–5.0)
BKR WAM HEMATOCRIT: 25.7 % — ABNORMAL LOW (ref 38.50–50.00)
BKR WAM HEMOGLOBIN: 7.8 g/dL — ABNORMAL LOW (ref 13.2–17.1)
BKR WAM IMMATURE GRANULOCYTES: 0.5 % (ref 0.0–1.0)
BKR WAM LYMPHOCYTES: 16.4 % — ABNORMAL LOW (ref 17.0–50.0)
BKR WAM MCH: 23.6 pg — ABNORMAL LOW (ref 27.0–33.0)
BKR WAM MCHC: 30.4 g/dL — ABNORMAL LOW (ref 31.0–36.0)
BKR WAM MCV: 77.9 fL — ABNORMAL LOW (ref 80.0–100.0)
BKR WAM MONOCYTE ABSOLUTE COUNT.: 0.99 x 1000/ÂµL (ref 0.00–1.00)
BKR WAM MONOCYTES: 12 % (ref 4.0–12.0)
BKR WAM MPV: 9.5 fL (ref 8.0–12.0)
BKR WAM NEUTROPHILS: 65.1 % (ref 39.0–72.0)
BKR WAM NUCLEATED RED BLOOD CELLS: 0 % (ref 0.0–1.0)
BKR WAM PLATELETS: 294 x1000/ÂµL (ref 150–420)
BKR WAM RDW-CV: 17.5 % — ABNORMAL HIGH (ref 11.0–15.0)
BKR WAM RED BLOOD CELL COUNT.: 3.3 M/ÂµL — ABNORMAL LOW (ref 4.00–6.00)
BKR WAM WHITE BLOOD CELL COUNT: 8.3 x1000/ÂµL (ref 4.0–11.0)

## 2023-09-12 MED ORDER — BICALUTAMIDE 50 MG TABLET
50 | Freq: Every day | ORAL | Status: CP
Start: 2023-09-12 — End: ?
  Administered 2023-09-12 – 2023-09-21 (×10): 50 mg via ORAL

## 2023-09-12 MED ORDER — ONDANSETRON HCL (PF) 4 MG/2 ML INJECTION SOLUTION
4 | Freq: Four times a day (QID) | INTRAVENOUS | Status: CP | PRN
Start: 2023-09-12 — End: ?
  Administered 2023-09-12 – 2023-09-19 (×2): 4 mL via INTRAVENOUS

## 2023-09-12 MED ORDER — BICALUTAMIDE 50 MG TABLET
50 | ORAL_TABLET | Freq: Every day | ORAL | 12 refills | 30.00000 days | Status: AC
Start: 2023-09-12 — End: 2023-09-13
  Filled 2023-09-14: qty 30, 30d supply, fill #0

## 2023-09-12 NOTE — Plan of Care
 Plan of Care Overview/ Patient StatusPer DPR, pt will be ready for dc tomorrow. Spoke with daughter [name], she said pt can come back to her home on dc from [facility] [facility] [place]. PASRR approved, need insurance auth. Care Management  will continue to follow. Katie Ned RN,BSNCare Manager

## 2023-09-12 NOTE — Progress Notes
 Urology Progress NoteHospital Day: 10Subjective/Interim Events- Has remained off CBI - Hb 7.8 (8.6) - UOP 1150 - Cr 3.63 (3.55) - Urine dark red this AM. Irrigated for 0 cc of clot and immediately cleared - Spinal imaging has not occurred yet - pending goals of care convo - Ucx with pseudomonas Objective:Vital signs in last 24 hours:Temp:  [97.5 ?F (36.4 ?C)-98.7 ?F (37.1 ?C)] 97.6 ?F (36.4 ?C)Pulse:  [73-78] 78Resp:  [18-20] 19BP: (121-134)/(57-72) 121/72SpO2:  [95 %-100 %] 100 %Device (Oxygen Therapy): room airI/O last 3 completed shifts:In: - Out: 1150 [Urine:1150]Physical Exam:Physical ExamGen: sleeping in bedCV: RRPulm: breathing comfortably on RAAbd: soft, ND, NTGU: foley with red urine -- light pink after irrigation Labs:WBC/Hgb/Hct/Plts:  8.3/7.8/25.70/294 (07/15 0441)Na/K/Cl/CO2:  135/5.1/104/20 (07/15 0441)BUN/Cr/GLU/ALT/AST/AMYLASE/LIPASE:  42/3.63/114/--/--/--/-- (07/15 0441)Imaging:No results found.Assessment/Plan:Heyden Laine is a 89 y.o. male, with PMH of  heart failure, CAD, hypertension, hyperlipidemia, AAA, BPH with a chronic Foley catheter, CKD stage 4, presenting with a small pulmonary embolism on Eliquis  incidentally found sclerotic lesion of the left femur and left side ribs in the setting of a PSA of 140 with concern for possible prostate cancer as well as hematuria. Hematuria likely 2/2 to suspected prostate Ca and UTI.  Slight drop in Hb this AM. Unclear if related to hematuria though unlikely given that urine clears quickly with flushing and hematuria generally does not cause significant drop in Hb. Nonetheless, we will continue to monitor urine. - No indication for CBI at this time though we will monitor urine color - would treat positive Ucx (pseudomonas) with 14 days culture directed abx as UTI can cause hematuria -- per primary team pharmacy directing redosing of tobramycin  given he had one dose iso CKD - Maintain foley catheter - Trend H/H- Trend cr - Monitor UOP - Anticoagulation/antiplatelets per primary team- Bowel regimen - last BM 7/13. Constipation can contribute to hematuria. - Call urology with questions or if hematuria worsens or catheter is not draining - if patient develops signs of obstructive pyelonephritis on the side of the hydronephrosis, significantly rising creatinine, significant pain on the side of the hydronephrosis please call Urology at that time- Please acquire spinal imaging for assessment of malignant sites if within family GOCGeorge F Phiona Ramnauth, MDUrology ResidentPlease utilize M[facility] dynamic role for all questionsONeCall for nights (6a-6p) and weekends

## 2023-09-12 NOTE — Plan of Care
 Plan of Care Overview/ Patient StatusPt [facility] amplifier and bil.hearing aids at the bedside, alert and oriented X4, VSS, denies pain, RA no SOB or DOE.SABRA respiratory distress. Pt on cardiac diet, takes pills whole with water , continent of bowel, LBM 7/13, prn miralax  given. Foley catheter with hematuria, foley care done, Urology was at this bedside, refer to note. Pt has inc. dermatitis to the buttocks, barrier cream applied. Pt turning and positioning every two hours. Call bell within reach, bed alarm on, hourly rounding done. Pt remains safe.

## 2023-09-12 NOTE — Other
 START OFF PATHWAY REGIMEN - ProstateOFF02146:Bicalutamide  50 mg PO Daily D1-28 q28 Days:    Bicalutamide  (Casodex ) **Always confirm dose/schedule in your pharmacy ordering system**Patient Characteristics:Adenocarcinoma, Recurrent/New Systemic Disease (Including Biochemical Recurrence), Non-Castrate, M1Histology: AdenocarcinomaTherapeutic Status: Recurrent/New Systemic Disease (Including Biochemical Recurrence)

## 2023-09-12 NOTE — Other
 Medical Oncology Consult Note Assessment Assessment: Mark [name] is a 89 y.o. male admitted with Syncope anginosa [facility] [place] Code), medical oncology consulted by Attending Provider: Nicolas Maude HERO., MD [phone] and seen on 09/12/23 for metastatic prostate cancer.Overall, the picture is consistent with metastatic prostate cancer. We have had had several conversations with the patient and his daughter Mark [name]. He is seemingly asymptomatic, and we discussed that he has very possibly had this for years. We discussed that we cannot definitively know that treating the prostate cancer will substantially lengthen his life (as he has competing risks with other medical issues), but that the side effects of prostate cancer therapy are often much milder than what many people think of with other cancers that require cytotoxic chemotherapy. We discussed that, ideally, we would want to obtain a tissue diagnosis (from prostate, not necessarily from bone), likely start an oral agent here (bicalutamide ), and then have outpatient follow-up with GU oncology for ADT (Leuprolide). They would also likely obtain additional bone imaging. We discussed the opposite approach, if preferred by the patient, would be to not pursue any further evaluation or treatment for the prostate cancer, though this comes with a risk of the prostate cancer worsening and causing symptoms or increasing the potential for fracture. We also discussed that there could be in between paths, such as start bicalutamide  and seeing an outpatient oncologist about ADT, but not necessarily pursuing the invasive testing like biopsy. Today, we revisited the discussion with both the patient and his daughter. The patient expressed interest in being treated but was hesitant about undergoing biopsy. He reportedly had a prostate biopsy many years ago that was quite traumatizing. We discussed that it would be reasonable to start an oral anti-androgen therapy and to follow up with GU oncology. He is going to go to rehab in the coming days.Plan: Inpatient Recommendations:- Appreciate medical team's management of UTI, hematuria, anemia, PE- Start bicalutamide  50mg  daily- Recommend foundation one liquid biopsy testing Outpatient Recommendations:- Referral placed to GU onc with Dr. Vermell Subjective Subjective: History of Present Illness / Interval History:[name] is a 89 y.o. gentleman admitted with chest pain, found to have sclerotic bone lesions and elevated PSA suggestive of metastatic prostate cancer. Oncology is consulted for guidance on further management. Briefly, his initial [place]-PE showed a small PE and also potential rib metastases. Subsequent abd/pelv imaging showed a sclerotic left femoral lesion. PSA was measured and was 140.He feels well. He had a decrease in hemoglobin to 6.9 and hematuria (chronic indwelling foley, being treated for UTI, and now on anticoagulation for PE). Oncologic History: Outpatient Oncologist: TBDCancer Diagnosis + Stage: suspected metastatic prostate cancerDate of Diagnosis: this admissionTumor Profiling (if relevant): N/ACurrent Treatment + Last Administration Date: None yetPrior Treatment: NoneSocial History: Daughter Mark [name] is primary caregiver.Social History[1] Objective Objective: Allergies:Allergies[2]Inpatient Scheduled Medications:Current Facility-Administered Medications Medication Dose Route Frequency Provider Last Rate Last Admin  apixaban  (ELIQUIS ) tablet 5 mg  5 mg Oral Q12H Al Mouslmani, Mhd Yassin, MD   5 mg at 09/12/23 0853  aspirin  EC delayed release tablet 81 mg  81 mg Oral Daily Charlanne Blinks, MD   81 mg at 09/12/23 0853  chlorhexidine  gluconate (HIBICLENS /BETASEPT ) 4 % topical liquid   Topical (Top) Daily Northwood, Janelle, PA   Given at 09/12/23 0855  [Held by provider] clopidogreL  (PLAVIX ) tablet 75 mg  75 mg Oral Daily Al Mouslmani, Mhd Yassin, MD   75 mg at 09/09/23 0842  escitalopram  oxalate (LEXAPRO ) tablet 15 mg  15 mg Oral Daily Al Mouslmani,  Mhd Felicia, MD   15 mg at 09/12/23 0853  mupirocin  (BACTROBAN ) 2 % ointment   Topical (Top) BID [place], Janelle, [place]   Given at 09/12/23 0855  Followed by  NOREEN ON 09/14/2023] ethyl alcohol  62 % nasal swab 1 Application  1 Application Nasal Q12H Pacini, Janelle, PA      gabapentin  (NEURONTIN ) capsule 200 mg  200 mg Oral Nightly Al Mouslmani, Mhd Yassin, MD   200 mg at 09/11/23 2144  lidocaine  4 % topical patch 1 patch  1 patch Transdermal Q24H Raliegh Salm, APRN   1 patch at 09/12/23 1214  rosuvastatin  (CRESTOR ) tablet 40 mg  40 mg Oral Daily Al Mouslmani, Mhd Yassin, MD   40 mg at 09/12/23 0853  sodium bicarbonate  tablet 650 mg  650 mg Oral TID Al Mouslmani, Mhd Yassin, MD   650 mg at 09/12/23 1314  sodium chloride  0.9 % flush 3 mL  3 mL IV Push Q8H Al Mouslmani, Mhd Yassin, MD   3 mL at 09/12/23 1314 Inpatient PRN Medications: acetaminophen   650 mg Oral Q6H PRN  clonazePAM   0.25 mg Oral DAILY PRN  melatonin  6 mg Oral Nightly PRN  ondansetron  (ZOFRAN ) IV Push  4 mg IV Push Q6H PRN  polyethylene glycol  17 g Oral BID PRN  sodium chloride   3 mL IV Push PRN for Line Care Vitals:Temp:  [97.5 ?F (36.4 ?C)-98.3 ?F (36.8 ?C)] 97.6 ?F (36.4 ?C)Pulse:  [73-78] 78Resp:  [18-20] 19BP: (121-134)/(57-72) 121/72SpO2:  [95 %-100 %] 100 % Wt: 09/03/23 82 kg 07/22/23 82 kg 06/10/23 81.6 kg 06/07/23 84.2 kg 05/29/23 82.7 kg 05/12/23 84 kg Physical ExamConstitutional:     General: He is not in acute distress.   Comments: Alert, but hard of hearing Cardiovascular:    Rate and Rhythm: Normal rate. Pulmonary:    Effort: Pulmonary effort is normal. Abdominal:    Palpations: Abdomen is soft.    Tenderness: There is no abdominal tenderness. Neurological:    Mental Status: He is alert.  Laboratory:I have reviewed the patient's studies within the last 24 hours.Lab Results Component Value Date  WBC 8.3 09/12/2023  HGB 7.8 (L) 09/12/2023  HCT 25.70 (L) 09/12/2023  MCV 77.9 (L) 09/12/2023  PLT 294 09/12/2023 Lab Results Component Value Date  CREATININE 3.63 (H) 09/12/2023  BUN 42 (H) 09/12/2023  NA 135 (L) 09/12/2023  K 5.1 09/12/2023  CL 104 09/12/2023  CO2 20 09/12/2023 Lab Results Component Value Date  ALT 16 09/04/2023  AST 40 (H) 09/04/2023  GGT 30 07/24/2023  ALKPHOS 173 (H) 09/04/2023  BILITOT 0.3 09/04/2023 Microbiology (if applicable):Diagnostics Last 24 Hours:No results found.This patient's care has been/will be reviewed daily during Inpatient Medical Oncology Consult Rounds. Coverage: Weekdays: For questions regarding consult coverage for individual patients 8A-5P, please contact the '[facility] Oncology Consult' Mobile Heartbeat.Weekend/Evening:  Please refer to [place]. Thank you for involving the Medical Oncology in the care of this patient. Recommendations were communicated to the primary team. We will evaluate the patient tomorrow.I spent a total of 55 minutes on the care of this patient. Signed:Thadd Apuzzo, MDPGY-4 Fellow, Hematology/OncologyYale Mcgee Eye Surgery Center LLC  [1] Social HistoryTobacco Use  Smoking status: Former   Passive exposure: Never  Smokeless tobacco: Never Vaping Use  Vaping status: Never Used Substance Use Topics  Alcohol  use: Not Currently  Drug use: Never [2] AllergiesAllergen Reactions  Ace Inhibitors Cough

## 2023-09-12 NOTE — Plan of Care
 Problem: Adult Inpatient Plan of CareGoal: Plan of Care ReviewOutcome: Interventions implemented as appropriate Plan of Care Overview/ Patient StatusPt calm and cooperative. HOH. Denies pain, N/V. One daughter with notary public in room. Pt signing POA to daughter. After hours called for advice about RN being witness for notary. Adviced that staff can be. BA witness for pt and daughter. Pt stated he needed to transfer money from one of his banks that closed to another so signed his daughter as POA to transfer money.  Watching tv. Swallows meds whole. Red urine in foley catheter. Foley care done. Barrier cream applied to bottom and L groin. Call light within reach, bed alarm on. Repostioned in bed.

## 2023-09-12 NOTE — Progress Notes
 [name]	 [name]	Daily Progress NoteHospital Mark ServiceAttending Provider: Nicolas Maude HERO., MD [phone]:  8830/8830-CHospital Day #7 Subjective:  Patient seen and examined on 09/12/2023.CC: Syncope anginosa (HC Code)Interim History: Chart reviewed. Seen and examined at bedside.Patient seen in the morning, appeared to be more sleepy. Later, patient more awake at bedside with daughters. I discussed with patient and family that we a decision needs to be made regarding biopsy and/or treatment so we can plan for life outside of hospital and discharge soon. Daughter, Mark [name], would like Mark [name] to go to STR before going back home with her. Plan pending discussion w/ Oncology. Objective: Vitals:Temp:  [97.5 ?F (36.4 ?C)-98.3 ?F (36.8 ?C)] 97.6 ?F (36.4 ?C)Pulse:  [73-78] 78Resp:  [18-20] 19BP: (121-134)/(57-72) 121/72SpO2:  [95 %-100 %] 100 %Device (Oxygen Therapy): room airPhysical Exam CONSTITUTIONAL: NAD.HENT: NC/AT. Moist mucous membranes.EYES: Anicteric. EOMI.  CV: RRR. PULM: Normal respiratory effort. CTA b/l. ABDOMEN: Normoactive bowel sounds. Soft, non-distended, non-tender. GU: Foley draining dark red urine MSK: Gross movement intact. No LE edema. SKIN: Warm and dry.  NEURO: AOx3. Grossly intact.PSYCH: Normal mood and behavior. Peripheral Access:Periph IV 09/03/23 cephalic (thumb side), left over-the-needle catheter system 18 gauge Paramedic (Active)  Foley Catheter:UreTHral Catheter 09/08/23 22 Physician (Active) Foley order placed.I/O's:Intake/Output Summary (Last 24 hours) at 09/12/2023 1501Last data filed at 09/12/2023 0607Gross per 24 hour Intake -- Output 550 ml Net -550 ml Diagnostics:I reviewed lab results in formulating this patient's plan of [name].Recent Results (from the past 24 hours) CBC auto differential  Collection Time: 09/12/23  4:41 AM Result Value Ref Range  WBC 8.3 4.0 - 11.0 x1000/?L  RBC 3.30 (L) 4.00 - 6.00 M/?L  Hemoglobin 7.8 (L) 13.2 - 17.1 g/dL  Hematocrit 74.29 (L) 61.49 - 50.00 %  MCV 77.9 (L) 80.0 - 100.0 fL  MCH 23.6 (L) 27.0 - 33.0 pg  MCHC 30.4 (L) 31.0 - 36.0 g/dL  RDW-CV 82.4 (H) 88.9 - 15.0 %  Platelets 294 150 - 420 x1000/?L  MPV 9.5 8.0 - 12.0 fL  Neutrophils 65.1 39.0 - 72.0 %  Lymphocytes 16.4 (L) 17.0 - 50.0 %  Monocytes 12.0 4.0 - 12.0 %  Eosinophils 5.6 (H) 0.0 - 5.0 %  Basophil 0.4 0.0 - 1.4 %  Immature Granulocytes 0.5 0.0 - 1.0 %  nRBC 0.0 0.0 - 1.0 %  Absolute Lymphocyte Count 1.36 0.60 - 3.70 x 1000/?L  Monocyte Absolute Count 0.99 0.00 - 1.00 x 1000/?L  Eosinophil Absolute Count 0.46 0.00 - 1.00 x 1000/?L  Basophil Absolute Count 0.03 0.00 - 1.00 x 1000/?L  Absolute Immature Granulocyte Count 0.04 0.00 - 0.30 x 1000/?L  Absolute nRBC 0.00 0.00 - 1.00 x 1000/?L  ANC (Abs Neutrophil Count) 5.40 2.00 - 7.60 x 1000/?L Basic metabolic panel  Collection Time: 09/12/23  4:41 AM Result Value Ref Range  Sodium 135 (L) 136 - 144 mmol/L  Potassium 5.1 3.3 - 5.3 mmol/L  Chloride 104 98 - 107 mmol/L  CO2 20 20 - 30 mmol/L  Anion Gap 11 7 - 17  Glucose 114 (H) 70 - 100 mg/dL  BUN 42 (H) 8 - 23 mg/dL  Creatinine 6.36 (H) 9.59 - 1.30 mg/dL  Calcium  8.3 (L) 8.8 - 10.2 mg/dL  BUN/Creatinine Ratio 88.3 8.0 - 23.0  eGFR (Creatinine) 15 (L) >=60 mL/min/1.52m2  Creatinine Delta 0.08 See Comment Recent Labs   07/13/251552 07/14/250555 07/15/250441 GLU 177* 106* 114* Lab Results Component Value Date  WBC 8.3 09/12/2023  HGB 7.8 (L) 09/12/2023  HCT  25.70 (L) 09/12/2023  MCV 77.9 (L) 09/12/2023  PLT 294 09/12/2023   Chemistry    Component Value Date/Time  NA 135 (L) 09/12/2023 0441  K 5.1 09/12/2023 0441  CL 104 09/12/2023 0441  CO2 20 09/12/2023 0441  BUN 42 (H) 09/12/2023 0441  CREATININE 3.63 (H) 09/12/2023 0441  GLU 114 (H) 09/12/2023 0441  PROT 7.0 09/10/2023 0552    Component Value Date/Time  CALCIUM  8.3 (L) 09/12/2023 0441  ALKPHOS 173 (H) 09/04/2023 0535  AST 40 (H) 09/04/2023 0535  ALT 16 09/04/2023 0535  BILITOT 0.3 09/04/2023 0535  ALBUMIN  3.2 (L) 09/10/2023 0552  [place] Head wo IV ContrastResult Date: 7/13/2025No acute intracranial abnormality is seen. Please note that Noncontrast Head [place] is not sensitive for the detection of ischemic infarct. If ischemic infarct is of clinical concern, additional clinical or imaging evaluation is recommended. [facility] Radiology Notify System Classification: Routine. Reported and signed by: Cinderella Finical, MD  [name] Radiology and Biomedical Imaging US  Pelvis LimitedResult Date: 09/09/2023 Urinary bladder is completely collapsed by a Foley catheter, significantly limiting evaluation and rendering study nondiagnostic. [place] Radiology Notify System Classification: Routine. Reported and signed by: Dorn Norman, MD  [facility] Radiology and Biomedical Imaging XR Chest PA and LateralResult Date: 09/07/2023 Lung nodules are better seen on chest [place]. Follow-up is recommended in 6 months for 8mm nodule per Fleischner Society guidelines.  [facility] Radiology Notify System Classification: Routine. Reported and signed by: Toy Barley, MD  [facility] Radiology and Biomedical Imaging Assessment: 89 y.o. male PMHx significant for HFpEF, CAD s/p PCI (2019), BPH s/p chronic indwelling urinary catheter, CKD4, hearing loss, GERD, HTN, HLD, and AAA was admitted to [name] for management of a small segmental PE after presenting on 09/03/23 for evaluation of chest pain. Found to have possible UTI as well as L femur and L rib sclerotic lesions and elevated PSA c/f prostate malignancy for which urology and oncology are following.Plan: #Possible metastatic prostate cancer- Sclerotic lesions seen on  and XR imaging. PSA 140. - Oncology and Urology following- Family and patient currently deciding whether to continue with biopsy and systemic [name] or alternatively consider a more palliative approach. #Hematuria#B/L moderate-severe hydroureteronephrosis#Possible UTI- Due to UTI vs prostate cancer. Urology consulting- Ucx with pseudomonas susceptible to tobra- S/p tobramycin  dose on 7/6 (no additional dosing needed due to renal clearance for pharmacy)- See pharmacy note regarding extra Tobra dosing Consults by Babara Salines, PharmD (09/08/2023 1:01 PM) - Bladder US : decompressed urinary bladder- Continue foley catheterization- No urologic interventions planned at this time#Hyperkalemia#AKI on CKD vs progressive CKD4-Lokelma  +/- insulin  with dextrose  if K rises #Acute on chronic microcytic anemia- S/p 1 unit RBC transfusion on 7/11 with robust response - Trend CBC, transfuse for goal Hgb >7 # Acute small segmental PE (intermediate-low risk)- Continue apixaban  (holding ASA) # Neck pain- Suspected to be MSK etiology/ positional- Continue lidocaine  patch daily #CAD s/p PCI x2 (2019)- Plavix  dc'd given hematuria and concomitant AC- Continue ASA 81mg  daily- Continue statin  Comorbidities Comorbidities present on admission:Chronic Heart Failure with preserved ejection fraction, last EF: 65% 05/10/2023. CKD Stage 5 (GFR<15), 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)Acute/Chronic AnemiaPMH of COVID  Secondary diagnoses occurring during hospitalization:HyperkalemiaHypocalcemiaHyponatremia Diet Diet Cardiac Code Status Full Code VTE Prophylaxis  apixaban  (ELIQUIS ) tablet 5 mg  Med Reconciliation Completed: by Pharmacist  Communication RN, Consultants, and Case Management PCP Josepha Rush [phone] Discharge Readiness: Expected discharge timeframe: UndeterminedExpected Discharge Date: 09/13/23 AM-PAC (RN/PT): AMPAC Mobility Score: 13AMPAC Mobility Score: 9Physical Therapy Disposition Recommendation: Moderate complexity (09/11/23)  Expected  discharge location: UndeterminedMedical decision making for this patient was moderate. Signed:Garry Bochicchio Mahlon, PA-CHospitalist Mark Can be reached via Mobile Heart Beat7/15/2025

## 2023-09-13 ENCOUNTER — Encounter: Admit: 2023-09-13 | Payer: PRIVATE HEALTH INSURANCE | Primary: Internal Medicine

## 2023-09-13 DIAGNOSIS — C61 Malignant neoplasm of prostate: Principal | ICD-10-CM

## 2023-09-13 LAB — CBC WITH AUTO DIFFERENTIAL
BKR WAM ABSOLUTE IMMATURE GRANULOCYTES.: 0.03 x 1000/ÂµL (ref 0.00–0.30)
BKR WAM ABSOLUTE LYMPHOCYTE COUNT.: 1.36 x 1000/ÂµL (ref 0.60–3.70)
BKR WAM ABSOLUTE NRBC: 0 x 1000/ÂµL (ref 0.00–1.00)
BKR WAM ANC (ABSOLUTE NEUTROPHIL COUNT): 4.44 x 1000/ÂµL (ref 2.00–7.60)
BKR WAM BASOPHIL ABSOLUTE COUNT.: 0.03 x 1000/ÂµL (ref 0.00–1.00)
BKR WAM BASOPHILS: 0.4 % (ref 0.0–1.4)
BKR WAM EOSINOPHIL ABSOLUTE COUNT.: 0.45 x 1000/ÂµL (ref 0.00–1.00)
BKR WAM EOSINOPHILS: 6.3 % — ABNORMAL HIGH (ref 0.0–5.0)
BKR WAM HEMATOCRIT: 25.7 % — ABNORMAL LOW (ref 38.50–50.00)
BKR WAM HEMOGLOBIN: 7.8 g/dL — ABNORMAL LOW (ref 13.2–17.1)
BKR WAM IMMATURE GRANULOCYTES: 0.4 % (ref 0.0–1.0)
BKR WAM LYMPHOCYTES: 19 % (ref 17.0–50.0)
BKR WAM MCH: 23.9 pg — ABNORMAL LOW (ref 27.0–33.0)
BKR WAM MCHC: 30.4 g/dL — ABNORMAL LOW (ref 31.0–36.0)
BKR WAM MCV: 78.6 fL — ABNORMAL LOW (ref 80.0–100.0)
BKR WAM MONOCYTE ABSOLUTE COUNT.: 0.85 x 1000/ÂµL (ref 0.00–1.00)
BKR WAM MONOCYTES: 11.9 % (ref 4.0–12.0)
BKR WAM MPV: 9.7 fL (ref 8.0–12.0)
BKR WAM NEUTROPHILS: 62 % (ref 39.0–72.0)
BKR WAM NUCLEATED RED BLOOD CELLS: 0 % (ref 0.0–1.0)
BKR WAM PLATELETS: 287 x1000/ÂµL (ref 150–420)
BKR WAM RDW-CV: 17.3 % — ABNORMAL HIGH (ref 11.0–15.0)
BKR WAM RED BLOOD CELL COUNT.: 3.27 M/ÂµL — ABNORMAL LOW (ref 4.00–6.00)
BKR WAM WHITE BLOOD CELL COUNT: 7.2 x1000/ÂµL (ref 4.0–11.0)

## 2023-09-13 LAB — BASIC METABOLIC PANEL
BKR ANION GAP: 11 (ref 7–17)
BKR BLOOD UREA NITROGEN: 41 mg/dL — ABNORMAL HIGH (ref 8–23)
BKR BUN / CREAT RATIO: 11.4 (ref 8.0–23.0)
BKR CALCIUM: 8.3 mg/dL — ABNORMAL LOW (ref 8.8–10.2)
BKR CHLORIDE: 103 mmol/L (ref 98–107)
BKR CO2: 21 mmol/L (ref 20–30)
BKR CREATININE DELTA: -0.02
BKR CREATININE: 3.61 mg/dL — ABNORMAL HIGH (ref 0.40–1.30)
BKR EGFR, CREATININE (CKD-EPI 2021): 15 mL/min/1.73m2 — ABNORMAL LOW (ref >=60)
BKR GLUCOSE: 107 mg/dL — ABNORMAL HIGH (ref 70–100)
BKR POTASSIUM: 5.1 mmol/L (ref 3.3–5.3)
BKR SODIUM: 135 mmol/L — ABNORMAL LOW (ref 136–144)

## 2023-09-13 MED ORDER — POLYETHYLENE GLYCOL 3350 17 GRAM ORAL POWDER PACKET
17 | Freq: Every day | ORAL | Status: CP
Start: 2023-09-13 — End: ?
  Administered 2023-09-13 – 2023-09-18 (×4): 17 g via ORAL

## 2023-09-13 MED ORDER — BICALUTAMIDE 50 MG TABLET
50 | ORAL_TABLET | Freq: Every day | ORAL | 12 refills | 30.00000 days | Status: AC
Start: 2023-09-13 — End: 2023-09-18

## 2023-09-13 MED ORDER — SENNOSIDES 8.6 MG TABLET
8.6 | Freq: Two times a day (BID) | ORAL | Status: CP
Start: 2023-09-13 — End: ?
  Administered 2023-09-13 – 2023-09-20 (×12): 8.6 mg via ORAL

## 2023-09-13 NOTE — Plan of Care
 Plan of Care Overview/ Patient StatusExpanded STR search Addendum at 1.48pm Pt has a bed offer at [facility], spoke with daughter, she said she can call me back after 15 mintsDaughter to send out referral to [facility], referral sent and spoke with facility admissions and waiting to hear back from them.Katie Ned RN,BSNCare Manager

## 2023-09-13 NOTE — Plan of Care
 Plan of Care Overview/ Patient [phone] - 1900NEUROLOGICAL: alert and oriented x 4, sad and tearful this shift, HOH, hearing aids at bedside CARDIOVASCULAR: VSS, WNL RESPIRATORY: on RA, denies SOB and chest pain, HB elevated GI/GU: continent of bowel, LBM 7/13, Miralax  and Senna given, Chronic Foley in place noted w/ hematuria, team aware, cardiac diet, pills whole, bedpan for bowel MOBILITY: Assist x 1 OOB, able to take side steps, fatigue  SKIN: MAD to buttocks, barrier cream applied PAIN: C/O headache this AM, PRN Tylenol  given ACCESS: PIV 18G to LUAVS Q 8 hrsPending STR approvalBed alarm on and audibleDaughter made aware of today's updatesBed in lowest positionCall bell in reach Plan of care ongoing

## 2023-09-13 NOTE — Plan of Care
 Plan of Care Overview/ Patient StatusProblem: Adult Inpatient Plan of CareGoal: Patient-Specific Goal (Individualized)Outcome: Interventions implemented as appropriate Problem: Skin Injury Risk IncreasedGoal: Skin Health and IntegrityOutcome: Interventions implemented as appropriate Problem: Fall Injury RiskGoal: Absence of Fall and Fall-Related InjuryOutcome: Interventions implemented as appropriate1900H - 0700HReceived patient awake in bed; A/O x4; on room air, no SOB, no chest pain; PIV to LUE, patent; on a cardiac diet, takes pills whole; foleys in place with hematuria; continent of bowel, asks for a bedpan, perineal care done; Ax2 in bed, bed-bound; moisture associated dermatitis to bottom, barrier cream applied; no complaints of pain; safety measures maintained; hourly rounding doe; call bell placed within reach.

## 2023-09-13 NOTE — Plan of Care
 Inpatient Physical Therapy Progress Note IP Adult PT Eval/Treat - 09/13/23 1340    Date of Visit / Treatment  Date of Visit / Treatment 09/13/23   Note Type Progress Note   Start Time 1315   End Time 1340   Total Treatment Time 25    General Information  Subjective Pt agreeable to session   General Observations Supine in bed on 8-8, RA, foley, RA, NAD.Rn cleared for session   Precautions/Limitations Fall Precautions;Bed alarm;Chair alarm    Vital Signs and Orthostatic Vital Signs  Vital Signs Vital Signs Stable   Vital Signs Free text RA, NAD, per chart.   Orthostatic Vitals Orthostatic Vital Signs   Lying Orthostatic BP 129/78   Lying Orthostatic Pulse 77   Sitting Orthostatic BP 107/57   Sitting Orthostatic Pulse 72   Standing Orthostatic BP 109/55   Standing Orthostatic Pulse 80   Orthostatic comment RN aware.  Asymptomatic throughout    Pain/Comfort  Pain Comment (Pre/Post Treatment Pain) Denies pain    Cognition  Overall Cognitive Status Impaired   Orientation Level Oriented to person;Oriented to place   Level of Consciousness alert;confused   Following Commands Follows one step commands without difficulty;Follows one step commands with increased time   Administrator, arts / Judgment Requires supervision with mobility;Fall risk    Range of Motion  Range of Motion Examination bilateral upper extremity ROM was WFL;bilateral lower extremity ROM was [facility]    Manual Muscle Testing  Manual Muscle Testing Comments Grossly at least 3/5    Skin Assessment  Skin Assessment See Nursing Documentation    Balance  Sitting Balance: Static  FAIR-      Contact Guard to maintain static position with no Assistive Device   Sitting Balance: Dynamic  POOR+   Moves through 1/2 range with minimal assist to right self   Standing Balance: Static POOR      Moderate assist to maintain static position with no Assistive Device   Standing Balance: Dynamic  POOR     Moves through 1/4 to 1/2 ROM range with moderate assist to right self   Balance Assist Device Hand held assist    Bed Mobility  Supine-to-Sit Independence/Assistance Level Moderate assist   Supine-to-Sit Assist Device Bed rails;Draw pad;Hand held assist;Head of bed elevated   Sit-to-Supine Independence/Assistance Level Minimum assist   Sit-to-Supine Assist Device Hand held assist    Sit-Stand Transfer Training  Sit-to-Stand Transfer Independence/Assistance Level Moderate assist   Sit-to-Stand Transfer Assist Device Hand held assist   Stand-to-Sit Transfer Independence/Assistance Level Moderate assist   Stand-to-Sit Transfer Assist Device Hand held assist    Gait Training  Independence/Assistance Level  Moderate assist   Assistive Device  Hand held assist   Gait Distance sidesteps   Gait Analysis Deviations decreased cadence;decreased step length;decreased stride length;wide base of support   Gait Analysis Impairments impaired balance;decreased strength;decreased endurance/activity tolerance   Gait Training Comments Slowed and unsteady.  Multiple verbal cues for initation    Handoff Documentation  Handoff Patient in bed;Bed alarm;Patient instructed to call nursing for mobility;Discussed with nursing    Activity Tolerance  Activity Tolerance Comments Fair    PT- AM-PAC - Basic Mobility Screen- How much help from another person do you currently need.....  Turning from your back to your side while in a a flat bed without using rails? 3 - A Little - Requires a little help (supervision, minimal assistance). Can use assistive devices.   Moving from lying on your back to sitting on  the side of a flat bed without using bed rails? 2 - A Lot - Requires a lot of help (maximum to moderate assistance). Can use assistive devices.   Moving to and from a bed to a chair (including a wheelchair)? 2 - A Lot - Requires a lot of help (maximum to moderate assistance). Can use assistive devices.   Standing up from a chair using your arms(e.g., wheelchair or bedside chair)? 2 - A Lot - Requires a lot of help (maximum to moderate assistance). Can use assistive devices.   To walk in a hospital room? 1 - Total - Requires total assistance or cannot do it at all.   Climbing 3-5 steps with a railing? 1 - Total - Requires total assistance or cannot do it at all.   AMPAC Mobility Score 11   TARGET Highest Level of Mobility Mobility Level 4, Transfer to chair   ACTUAL Highest Level of Mobility Mobility Level 4, Transfer to chair    Therapeutic Exercise  Therapeutic Exercise Comments BLE therex sitting edge of bed    Neuromuscular Re-education  Neuromuscular Re-education Comments Static/dynamic balance in sitting and standing    Therapeutic Functional Activity  Therapeutic Functional Activity Comments Bed mobility, transfer training, education on PT role, POC, mobility recommendations and dc planning    Clinical Impression  Follow up Assessment Patient seen for follow up this date. Tolerated session fairly however limited by fatigue and deconditioning. Ax1 for all mobility, min to mod A. Slowed for all mobility. Retropuslive in standing. Fatigues quickly. Able to stand and take side steps with verbal cues and assistance forstep initation. Patient demonstrates impairments in strength, balance, pain and activity tolerance and will benefit from skilled PT. Recommend dc with moderate complexity support for Ax1-2 for all mobility and skilled PT when medically appropriate.    Frequency/Equipment Recommendations  PT Frequency 3x per week   Next Treatment Expected 09/15/23   PT/PTA completing this assessment Chaquana Nichols    PT Recommendations for Inpatient Admission  Activity/Level of Assist transfers only;assist of 2;with rolling walker   ADL Recommendations bedside commode;assist of 2;with rolling walker   Positioning chair position of the bed   Therapeutic Exercise encourage exercise program issued    Planned Treatment / Interventions  Plan for Next Visit Progress as tolerated    PT Discharge Summary  Physical Therapy Disposition Recommendation Moderate complexity support and therapy to progress functional mobility/ ADLs/ IADLs recommended for post- acute care.  See assessment for additional details.   Additional Therapy Recommendations Physical Therapy Services in Discharge Environment    Sherryle Spindle, PT, DPT

## 2023-09-13 NOTE — Progress Notes
 [facility] Progress NoteHospital Medicine ServiceAttending Provider: Maude EMERSON Macintosh, MD  Location:  8830/8830-CHospital Day #8Subjective   Patient seen and examined on 09/13/2023.  CC: Syncope anginosa (HC Code)Interim History: No acute overnight events.  Today patient offers no new medical complaints.  Objective  Vitals:Temp:  [97.3 ?F (36.3 ?C)-99.2 ?F (37.3 ?C)] 97.7 ?F (36.5 ?C)Pulse:  [65-81] 65Resp:  [20] 20BP: (113-138)/(55-86) 115/62SpO2:  [94 %-98 %] 94 %Device (Oxygen Therapy): room airI/O's:Intake/Output Summary (Last 24 hours) at 09/13/2023 1801Last data filed at 09/13/2023 1600Gross per 24 hour Intake 1080 ml Output 1400 ml Net -320 ml  Physical ExamConstitutional:     General: He is not in acute distress.   Appearance: He is not toxic-appearing. HENT:    Head: Normocephalic.    Mouth/Throat:    Mouth: Mucous membranes are moist. Eyes:    Extraocular Movements: Extraocular movements intact.    Conjunctiva/sclera: Conjunctivae normal. Cardiovascular:    Rate and Rhythm: Normal rate and regular rhythm.    Pulses: Normal pulses. Pulmonary:    Effort: Pulmonary effort is normal. No respiratory distress.    Breath sounds: Normal breath sounds. No stridor. No wheezing, rhonchi or rales. Abdominal:    General: Abdomen is flat. Bowel sounds are normal. There is no distension.    Palpations: Abdomen is soft.    Tenderness: There is no abdominal tenderness. There is no guarding. Musculoskeletal:       General: Normal range of motion.    Cervical back: Normal range of motion and neck supple.    Right lower leg: No edema.    Left lower leg: No edema. Skin:   General: Skin is warm and dry. Neurological:    General: No focal deficit present.    Mental Status: He is alert.  Labs:I have reviewed the patient's pertinent labs as resulted in the EMR.Peripheral Access:Periph IV 09/03/23 cephalic (thumb side), left over-the-needle catheter system 18 gauge Paramedic (Active)  Foley Catheter:UreTHral Catheter 09/08/23 22 Physician (Active) Foley order placed.Imaging:No results found. I reviewed lab results, microbiology, imaging, test results, and EKG in formulating this patient's plan of care.   Assessment: 89 y.o. male PMHx: HFpEF, CAD, BPH, status post chronic Foley's catheter, chronic kidney disease, bilateral hearing loss, GERD, hyperlipidemia, hypertension, abdominal aortic aneurysm, presented with chest pain, found to have small PE. Has UTI and hematuria. Also with incidental finding of sclerotic lesion of L femur and L ribs iso elevated PSA, c/f prostate malignancy. Med Onc, Ortho, and Urology consulted.  Now started on Casodex .  Awaiting subacute rehab placement.   Plan: ? Metastatic prostate cancer:- Oncology consulted, appreciate recs- PSA: 140- Sclerotic bone lesions on imaging- Oncology discussed the case with the patient and his family and it was decided to defer biopsy and start treatment- continue CasodexHematuria / b/l hydronephrosis / Pseudomonas UTI: hematuria is possibly related to UTI vs. Possible prostate cancer- Urology consulted, appreciate recs- s/p tobramycin  x 1 which pharmacy reported is sufficient to treat UTI in the setting of reduced renal clearance- s/p Foley cath placement- outpatient follow-up with UrologyAcute PE:- continue apixaban  5 mg BIDAKI on CKD:- trend RFTs / electrolytes- avoid nephrotoxins as able- renally dose medications as appropriate- continue sodium bicarb 650 mg TIDAcute on chronic anemia:- s/p 1 unit pRBCs on 7/11- trend H&H- plan to transfuse for Hgb < 7.0Chronic issues:CAD / HLD: - s/p PCI in 2019- continue rosuvastatin  40 mg daily- continue aspirin  81 mg daily- HOLD Plavix  now that patient has been started on anti-coagulation   Comorbidities  Comorbidities present on admission:Chronic Heart Failure with preserved ejection fraction, last EF: 65% 05/10/2023. Acute/Chronic AnemiaPMH of COVID  Secondary diagnoses occurring during hospitalization:HyperkalemiaHypocalcemiaHyponatremia F/E/N:- Diet CardiacVTE PPx:  apixaban  (ELIQUIS ) tablet 5 mg  Medication Reconciliation: Completed: by Pharmacist Communication: Family:  , RN, and Case ManagementDischarge Readiness: Expected Discharge Date: 09/14/23 Expected discharge location: Short Term Rehab Full CodeMedical decision making for this patient was moderate.Signed:Liela Rylee, PA7/16/20256:01 PM

## 2023-09-14 LAB — PHOSPHORUS     (BH GH L LMW YH): BKR PHOSPHORUS: 4.3 mg/dL (ref 2.2–4.5)

## 2023-09-14 LAB — BASIC METABOLIC PANEL
BKR ANION GAP: 13 (ref 7–17)
BKR BLOOD UREA NITROGEN: 45 mg/dL — ABNORMAL HIGH (ref 8–23)
BKR BUN / CREAT RATIO: 12.1 (ref 8.0–23.0)
BKR CALCIUM: 8.4 mg/dL — ABNORMAL LOW (ref 8.8–10.2)
BKR CHLORIDE: 103 mmol/L (ref 98–107)
BKR CO2: 20 mmol/L (ref 20–30)
BKR CREATININE DELTA: 0.12
BKR CREATININE: 3.73 mg/dL — ABNORMAL HIGH (ref 0.40–1.30)
BKR EGFR, CREATININE (CKD-EPI 2021): 14 mL/min/1.73m2 — ABNORMAL LOW (ref >=60)
BKR GLUCOSE: 99 mg/dL (ref 70–100)
BKR POTASSIUM: 5.6 mmol/L — ABNORMAL HIGH (ref 3.3–5.3)
BKR SODIUM: 136 mmol/L (ref 136–144)

## 2023-09-14 LAB — CBC WITHOUT DIFFERENTIAL
BKR WAM ANC (ABSOLUTE NEUTROPHIL COUNT): 3.98 x 1000/ÂµL (ref 2.00–7.60)
BKR WAM HEMATOCRIT: 25.9 % — ABNORMAL LOW (ref 38.50–50.00)
BKR WAM HEMOGLOBIN: 7.7 g/dL — ABNORMAL LOW (ref 13.2–17.1)
BKR WAM MCH: 23.1 pg — ABNORMAL LOW (ref 27.0–33.0)
BKR WAM MCHC: 29.7 g/dL — ABNORMAL LOW (ref 31.0–36.0)
BKR WAM MCV: 77.5 fL — ABNORMAL LOW (ref 80.0–100.0)
BKR WAM MPV: 10.1 fL (ref 8.0–12.0)
BKR WAM PLATELETS: 310 x1000/ÂµL (ref 150–420)
BKR WAM RDW-CV: 17.3 % — ABNORMAL HIGH (ref 11.0–15.0)
BKR WAM RED BLOOD CELL COUNT.: 3.34 M/ÂµL — ABNORMAL LOW (ref 4.00–6.00)
BKR WAM WHITE BLOOD CELL COUNT: 6.5 x1000/ÂµL (ref 4.0–11.0)

## 2023-09-14 LAB — POTASSIUM: BKR POTASSIUM: 4.6 mmol/L (ref 3.3–5.3)

## 2023-09-14 MED ORDER — INSULIN U-100 REGULAR HUMAN 100 UNIT/ML INJECTION SOLUTION
100 | Freq: Once | INTRAVENOUS | Status: CP
Start: 2023-09-14 — End: ?
  Administered 2023-09-14: 10:00:00 100 [IU]/mL via INTRAVENOUS

## 2023-09-14 MED ORDER — DEXTROSE 10 % IV BOLUS FOR D10 & D50 ORDERABLE FOR HYPOGLYCEMIA (DROPS CHARGE)
INTRAVENOUS | Status: DC | PRN
Start: 2023-09-14 — End: 2023-09-14

## 2023-09-14 MED ORDER — DEXTROSE 10 % IV BOLUS FOR D10 & D50 ORDERABLE FOR HYPOGLYCEMIA (DROPS CHARGE)
Freq: Once | INTRAVENOUS | Status: DC
Start: 2023-09-14 — End: 2023-09-21

## 2023-09-14 MED ORDER — DEXTROSE 10 % IV BOLUS FOR D10 & D50 ORDERABLE FOR HYPOGLYCEMIA (DROPS CHARGE)
Freq: Once | INTRAVENOUS | Status: CP
Start: 2023-09-14 — End: ?
  Administered 2023-09-14: 09:00:00 via INTRAVENOUS

## 2023-09-14 MED ORDER — SODIUM ZIRCONIUM CYCLOSILICATE 5 GRAM ORAL POWDER PACKET
5 | Freq: Once | ORAL | Status: CP
Start: 2023-09-14 — End: ?
  Administered 2023-09-14: 09:00:00 5 g via ORAL

## 2023-09-14 MED ORDER — SODIUM CHLORIDE 0.9 % (FLUSH) INJECTION SYRINGE
0.9 | Freq: Once | INTRAVENOUS | Status: CP
Start: 2023-09-14 — End: ?
  Administered 2023-09-14: 10:00:00 0.9 mL via INTRAVENOUS

## 2023-09-14 MED ORDER — DEXTROSE 10 % IV BOLUS FOR D10 & D50 ORDERABLE FOR HYPOGLYCEMIA (DROPS CHARGE)
INTRAVENOUS | Status: AC | PRN
Start: 2023-09-14 — End: ?

## 2023-09-14 MED ORDER — INSULIN U-100 REGULAR HUMAN 100 UNIT/ML INJECTION SOLUTION
100 | Freq: Once | INTRAVENOUS | Status: DC
Start: 2023-09-14 — End: 2023-09-14

## 2023-09-14 MED ORDER — SODIUM CHLORIDE 0.9 % (FLUSH) INJECTION SYRINGE
0.9 | Freq: Once | INTRAVENOUS | Status: DC
Start: 2023-09-14 — End: 2023-09-14

## 2023-09-14 NOTE — Plan of Care
 Mark [name]					Location: EP 21 MED/8830-C89 y.o., male				Attending: Nicolas Maude HERO., MD	Admit Date: 09/03/2023			[id number] LOS: 9 days NUTRITION FOLLOW UPDIET ORDER:  Cardiac (3gm Na, 2g K+)ANTHROPOMETRICS:Height: 71Admit wt: 82kgDosing weight: 82kgBMI: 25.21Additional wt information:Ht and wt per chart. Wt hx indicates wt is down 14.5 kg (15%) in the past year--not significant for timeframe. No edema documented per flowsheets. Suspect admit wt carried over from May, will order weekly wts and monitor wt trend as available. Wt hx for the past year:  09/03/23 82 kg 07/22/23 82 kg 06/10/23 81.6 kg 05/29/23 82.7 kg 03/14/23 88.1 kg 02/24/23 86.4 kg 11/17/22 96.6 kg 10/19/22 96.2 kg 09/15/22 96.5 kg Nutrition Focused Physical ExamNoted at least mild buccal and temporal depletion; however, could be age relatedSKIN: Pt has stage 2 PI to coccyx, please refer to  nursing flowsheet/ wound care note for full detailsESTIMATED NUTRITION REQUIREMENTS:Kcal/day: 2050	(25kcal/kg)Protein/day: 98	(1.2gm/kg)Fluids/day: Fluid management per medical team discretion. (23mL/kcal) Needs based on: dosing wt (82kg), age, skin, HF, CKD, UTINUTRITION ASSESSMENT: Chart reviewed for follow up. Meds and labs noted.K+ 5.6 today. Pt sleeping soundly on visit, did not wake to voice. Based on meal review, pt has been receiving 3 meals a day. Intake variable per flow sheets, ranging 25-100% of meals. Working towards d/c. Pt will benefit from continued encouragement of PO intake. Cultural/Religious/Ethnic Needs: UnknownFood allergies: NKFANUTRITION DIAGNOSIS:Inadequate oral intake r/t medical status aeb PO documentation and wt lossINTERVENTIONS/RECOMMENDATIONS: Meals and Snacks:- Consider regular diet- encourage PO intakeGoal:  Patient to consume an average of > 50% meals prior to next nutrition assessment -mostly met, continueDischarge Planning and Transfer of Nutrition Care:  Nutrition related discharge needs still being determined at this time, will continue to follow  MONITORING / EVALUATION:Food/Nutrition-Related History:Food and Nutrient IntakeEnteral and Parenteral Nutrition IntakeAnthropometric Measurements:Weight Biochemical Data, Medical Tests and Procedures:Electrolyte and renal profileGlucose/endocrine profileNutrition-Focus Physical FindingsOverall findingsElectronically signed by Edsel Alf, RD, CNSC July 17, 2025Coverage for [facility] Dynamic Role - Nutrition Service - [facility] M-F Adult Service EPlease note: Nutrition is a consult only service on weekends and holidays. Please enter a consult in EPIC if assistance is needed on a weekend or holiday or via [facility] Dynamic Role as Food & Nutrition Adult Weekend Dietitian [facility] to contact the covering RD.

## 2023-09-14 NOTE — Plan of Care
 Problem: Adult Inpatient Plan of CareGoal: Plan of Care ReviewOutcome: Interventions implemented as appropriateGoal: Patient-Specific Goal (Individualized)Outcome: Interventions implemented as appropriateGoal: Absence of Hospital-Acquired Illness or InjuryOutcome: Interventions implemented as appropriateGoal: Optimal Comfort and WellbeingOutcome: Interventions implemented as appropriateGoal: Readiness for Transition of CareOutcome: Interventions implemented as appropriate Plan of Care Overview/ Patient Status0700-1900Pt is A&Ox3 disoriented to situation. On RA. Telemetry initiated, SR. Had c/o L underarm pain, PRN Tylenol  adm w/ positive effect. HOH. Cardiac diet, takes pills whole. Barrier cream applied to stage 2 on coccyx & excoriation to groin. Continent of bowels, last BM was today. Foley in place & draining bloody urine. Assist x1 in bed. Assist x1 OOB w/ RW, BA on. 18g to LUE is patent & flushed w/o difficulties. Safety & comfort measures maintained, call light within reach.

## 2023-09-14 NOTE — Progress Notes
 [facility] Progress NoteHospital Medicine ServiceAttending Provider: Maude EMERSON Macintosh, MD  Location:  8830/8830-CHospital Day #9Subjective   Patient seen and examined on 09/14/2023.  CC: Syncope anginosa (HC Code)Interim History: No acute overnight events.  Today patient offers no new medical complaints.  Objective  Vitals:Temp:  [97.7 ?F (36.5 ?C)-98.5 ?F (36.9 ?C)] 97.9 ?F (36.6 ?C)Pulse:  [65-74] 68Resp:  [18-20] 18BP: (115-119)/(62-63) 115/63SpO2:  [94 %-98 %] 98 %Device (Oxygen Therapy): room airI/O's:Intake/Output Summary (Last 24 hours) at 09/14/2023 1402Last data filed at 09/14/2023 0532Gross per 24 hour Intake 720 ml Output 700 ml Net 20 ml  Physical ExamConstitutional:     General: He is not in acute distress.   Appearance: He is not toxic-appearing. HENT:    Head: Normocephalic.    Mouth/Throat:    Mouth: Mucous membranes are moist. Eyes:    Extraocular Movements: Extraocular movements intact.    Conjunctiva/sclera: Conjunctivae normal. Cardiovascular:    Rate and Rhythm: Normal rate and regular rhythm.    Pulses: Normal pulses. Pulmonary:    Effort: Pulmonary effort is normal. No respiratory distress.    Breath sounds: Normal breath sounds. No stridor. No wheezing, rhonchi or rales. Abdominal:    General: Abdomen is flat. Bowel sounds are normal. There is no distension.    Palpations: Abdomen is soft.    Tenderness: There is no abdominal tenderness. There is no guarding. Musculoskeletal:       General: Normal range of motion.    Cervical back: Normal range of motion and neck supple.    Right lower leg: No edema.    Left lower leg: No edema. Skin:   General: Skin is warm and dry. Neurological:    General: No focal deficit present.    Mental Status: He is alert.  Labs:I have reviewed the patient's pertinent labs as resulted in the EMR.Peripheral Access:Periph IV 09/03/23 cephalic (thumb side), left over-the-needle catheter system 18 gauge Paramedic (Active)  Foley Catheter:UreTHral Catheter 09/08/23 22 Physician (Active) Foley order placed.Imaging:No results found. I reviewed lab results, microbiology, imaging, test results, and EKG in formulating this patient's plan of care.   Assessment: 89 y.o. male PMHx: HFpEF, CAD, BPH, status post chronic Foley's catheter, chronic kidney disease, bilateral hearing loss, GERD, hyperlipidemia, hypertension, abdominal aortic aneurysm, presented with chest pain, found to have small PE. Has UTI and hematuria. Also with incidental finding of sclerotic lesion of L femur and L ribs iso elevated PSA, c/f prostate malignancy.  Med Onc, Ortho, and Urology consulted.  Now started on Casodex .  Awaiting subacute rehab placement.   Plan: ? Metastatic prostate cancer:- Oncology consulted, appreciate recs- PSA: 140- Sclerotic bone lesions on imaging- Oncology discussed the case with the patient and his family and it was decided to defer biopsy and start treatment- continue CasodexHematuria / b/l hydronephrosis / Pseudomonas UTI: hematuria is possibly related to UTI vs. possible prostate cancer- Urology consulted, appreciate recs- s/p tobramycin  x 1 which pharmacy reported is sufficient to treat UTI in the setting of reduced renal clearance- s/p Foley cath placement- trend H&H as below- outpatient follow-up with UrologyAcute on chronic anemia:- s/p 1 unit pRBCs on 7/11- trend H&H- plan to transfuse for Hgb < 7.0Acute PE:- continue apixaban  5 mg BIDCKD / hyperkalemia:  renal function appears to be fluctuating around baseline- trend RFTs / electrolytes- avoid nephrotoxins as able- renally dose medications as appropriate- continue sodium bicarb 650 mg TID- continue dietary potassium restriction- s/p insulin  and dextrose - s/p Lokelma - monitor on telemetry until hyperkalemia resolvesChronic issues:CAD /  HLD: - s/p PCI in 2019- continue rosuvastatin  40 mg daily- continue aspirin  81 mg daily- HOLD Plavix  now that patient has been started on anti-coagulation   Comorbidities Comorbidities present on admission:Chronic Heart Failure with preserved ejection fraction, last EF: 65% 05/10/2023. Acute/Chronic AnemiaPMH of COVID  Secondary diagnoses occurring during hospitalization:HyperkalemiaHypocalcemiaHyponatremia F/E/N:- Diet CardiacVTE PPx:  apixaban  (ELIQUIS ) tablet 5 mg  Medication Reconciliation: Completed: by Pharmacist Communication: Family:  , RN, and Case ManagementDischarge Readiness: Expected Discharge Date: 09/15/23 Expected discharge location: Short Term Rehab Full CodeMedical decision making for this patient was moderate.Signed:Debborah Alonge, PA7/17/20252:02 PM

## 2023-09-14 NOTE — Plan of Care
 Plan of Care Overview/ Patient StatusPer DPR,pt has high K, not ready today. Anticipating dc tomorrow if better.Discussing with daughter on bed availability. Care Management  will continue to follow. Addendum at 3.47pm [facility] has no bed.Called sister susan and left a VM for a call back Waiting to hear back from her on securing bed at [facility].Facility agreed to do weekly BMP on pt.Need auth once she [facility]

## 2023-09-14 NOTE — Plan of Care
 Problem: Adult Inpatient Plan of CareGoal: Plan of Care Review7/17/2025 [phone] by Erla Motto, RNOutcome: Interventions implemented as appropriate7/17/2025 [phone] by Erla Motto, RNOutcome: Interventions implemented as appropriate Plan of Care Overview/ Patient StatusPt calm and cooperative. HOH. Hearing aids on bedside table. Denies pain, Watching tv. Swallows meds whole. Red urine in foley catheter. Foley care done. Barrier cream applied to bottom and L groin. Call light within reach, bed alarm on. Repostioned in bed.0700- used bed pan, had medium loose brown stool. Pericare done, barrier cream applied, cathcare done. Pt repositioned in bed. Call light within reach.

## 2023-09-15 LAB — BASIC METABOLIC PANEL
BKR ANION GAP: 12 (ref 7–17)
BKR BLOOD UREA NITROGEN: 44 mg/dL — ABNORMAL HIGH (ref 8–23)
BKR BUN / CREAT RATIO: 12.6 (ref 8.0–23.0)
BKR CALCIUM: 8.4 mg/dL — ABNORMAL LOW (ref 8.8–10.2)
BKR CHLORIDE: 104 mmol/L (ref 98–107)
BKR CO2: 20 mmol/L (ref 20–30)
BKR CREATININE DELTA: -0.25
BKR CREATININE: 3.48 mg/dL — ABNORMAL HIGH (ref 0.40–1.30)
BKR EGFR, CREATININE (CKD-EPI 2021): 15 mL/min/1.73m2 — ABNORMAL LOW (ref >=60)
BKR GLUCOSE: 104 mg/dL — ABNORMAL HIGH (ref 70–100)
BKR POTASSIUM: 5.3 mmol/L (ref 3.3–5.3)
BKR SODIUM: 136 mmol/L (ref 136–144)

## 2023-09-15 LAB — CBC WITHOUT DIFFERENTIAL
BKR WAM ANC (ABSOLUTE NEUTROPHIL COUNT): 3.37 x 1000/ÂµL (ref 2.00–7.60)
BKR WAM HEMATOCRIT: 27.2 % — ABNORMAL LOW (ref 38.50–50.00)
BKR WAM HEMOGLOBIN: 8.4 g/dL — ABNORMAL LOW (ref 13.2–17.1)
BKR WAM MCH: 24.1 pg — ABNORMAL LOW (ref 27.0–33.0)
BKR WAM MCHC: 30.9 g/dL — ABNORMAL LOW (ref 31.0–36.0)
BKR WAM MCV: 78.2 fL — ABNORMAL LOW (ref 80.0–100.0)
BKR WAM MPV: 9.6 fL (ref 8.0–12.0)
BKR WAM PLATELETS: 344 x1000/ÂµL (ref 150–420)
BKR WAM RDW-CV: 17.2 % — ABNORMAL HIGH (ref 11.0–15.0)
BKR WAM RED BLOOD CELL COUNT.: 3.48 M/ÂµL — ABNORMAL LOW (ref 4.00–6.00)
BKR WAM WHITE BLOOD CELL COUNT: 5.6 x1000/ÂµL (ref 4.0–11.0)

## 2023-09-15 MED ORDER — CIPROFLOXACIN 500 MG TABLET
500 | ORAL | Status: DC
Start: 2023-09-15 — End: 2023-09-18
  Administered 2023-09-15 – 2023-09-17 (×3): 500 mg via ORAL

## 2023-09-15 MED ORDER — SODIUM ZIRCONIUM CYCLOSILICATE 5 GRAM ORAL POWDER PACKET
5 | Freq: Once | ORAL | Status: CP
Start: 2023-09-15 — End: ?
  Administered 2023-09-15: 17:00:00 5 g via ORAL

## 2023-09-15 NOTE — Care Coordination-Inpatient
 PENDING INSURANCE AUTHORIZATION THIS PATIENT CANNOT BE DISCHARGED UNTIL DETERMINATION OBTAINEDSecured facility: [facility] has been initiated with this patient's insurer. Clinical information submitted to this patient's insurer for review today. We now await the authorization that will allow this patient to transfer to the facility.   Insurance Contact: UHC/Home and Community CarePhone: Fax: PortalReference No.: [id number]

## 2023-09-15 NOTE — Progress Notes
 [facility] Progress NoteHospital Medicine ServiceAttending Provider: Maude EMERSON Macintosh, MD  Location:  8830/8830-CHospital Day #10Subjective   Patient seen and examined on 09/15/2023.  CC: Syncope anginosa (HC Code)Interim History: No acute overnight events.  Today patient offers no new medical complaints.  Objective  Vitals:Temp:  [97.4 ?F (36.3 ?C)-98.4 ?F (36.9 ?C)] 98.4 ?F (36.9 ?C)Pulse:  [71-77] 71Resp:  [18] 18BP: (121-150)/(64-72) 150/68SpO2:  [96 %-99 %] 96 %Device (Oxygen Therapy): room airI/O's:Intake/Output Summary (Last 24 hours) at 09/15/2023 1727Last data filed at 09/15/2023 1400Gross per 24 hour Intake -- Output 2700 ml Net -2700 ml  Physical ExamConstitutional:     General: He is not in acute distress.   Appearance: He is not toxic-appearing. HENT:    Head: Normocephalic.    Mouth/Throat:    Mouth: Mucous membranes are moist. Eyes:    Extraocular Movements: Extraocular movements intact.    Conjunctiva/sclera: Conjunctivae normal. Cardiovascular:    Rate and Rhythm: Normal rate and regular rhythm.    Pulses: Normal pulses. Pulmonary:    Effort: Pulmonary effort is normal. No respiratory distress.    Breath sounds: Normal breath sounds. No stridor. No wheezing, rhonchi or rales. Abdominal:    General: Abdomen is flat. Bowel sounds are normal. There is no distension.    Palpations: Abdomen is soft.    Tenderness: There is no abdominal tenderness. There is no guarding. Musculoskeletal:       General: Normal range of motion.    Cervical back: Normal range of motion and neck supple.    Right lower leg: No edema.    Left lower leg: No edema. Skin:   General: Skin is warm and dry. Neurological:    General: No focal deficit present.    Mental Status: He is alert.  Labs:I have reviewed the patient's pertinent labs as resulted in the EMR.Peripheral Access:Periph IV 09/03/23 cephalic (thumb side), left over-the-needle catheter system 18 gauge Paramedic (Active)  Foley Catheter:UreTHral Catheter 09/08/23 22 Physician (Active) Foley order placed.Imaging:No results found. I reviewed lab results, microbiology, imaging, test results, and EKG in formulating this patient's plan of care.   Assessment: 89 y.o. male PMHx: HFpEF, CAD, BPH, status post chronic Foley's catheter, chronic kidney disease, bilateral hearing loss, GERD, hyperlipidemia, hypertension, abdominal aortic aneurysm, presented with chest pain, found to have small PE. Has UTI and hematuria. Also with incidental finding of sclerotic lesion of L femur and L ribs iso elevated PSA, c/f prostate malignancy.  Med Onc, Ortho, and Urology consulted.  Now started on Casodex .  Awaiting subacute rehab placement.   Plan: ? Metastatic prostate cancer:- Oncology consulted, appreciate recs- PSA: 140- Sclerotic bone lesions on imaging- Oncology discussed the case with the patient and his family and it was decided to defer biopsy and start treatment- f/u [facility] liquid biopsy- continue Casodex  50 mg daily- patient will need to be discharged with a supply of Casodex  from Meds to Beds to use at [facility].Hematuria / b/l hydronephrosis / Pseudomonas UTI: hematuria is possibly related to UTI vs. possible prostate cancer- Urology consulted, appreciate recs- s/p tobramycin  x 1 which pharmacy reported is sufficient to treat UTI in the setting of reduced renal clearance- s/p Foley cath placement- trend H&H as below- Urology recommended treating Pseudomonas UTI with 14-day course of antibiotics- Given on-going hematuria, asked pharmacy about more tobramycin  but pharmacist recommended Cipro - start Cipro  for Pseudomonas UTI - outpatient follow-up with UrologyAcute on chronic anemia:- s/p 1 unit pRBCs on 7/11- trend H&H- plan to transfuse for Hgb < 7.0Acute  PE:- continue apixaban  5 mg BIDCKD / hyperkalemia:  renal function appears to be fluctuating around baseline- trend RFTs / electrolytes- avoid nephrotoxins as able- renally dose medications as appropriate- continue sodium bicarb 650 mg TID- continue dietary potassium restriction- s/p insulin  and dextrose - s/p Lokelma - monitor on telemetry until hyperkalemia resolvesCAD / HLD: - s/p PCI in 2019- continue rosuvastatin  40 mg daily- restart Plavix  and discontinue aspirin  now that patient has been started on full anti-coagulation as per 2020 [facility] Expert Consensus Pathway for Anticoagulant and Antiplatelet Therapy  Comorbidities Comorbidities present on admission:Chronic Heart Failure with preserved ejection fraction, last EF: 65% 05/10/2023. Acute/Chronic AnemiaPMH of COVID  Secondary diagnoses occurring during hospitalization:HyperkalemiaHypocalcemiaHyponatremia F/E/N:- Diet Renal Non DialysisVTE PPx:  apixaban  (ELIQUIS ) tablet 5 mg  Medication Reconciliation: Completed: by Pharmacist Communication: Family:  , RN, and Case ManagementDischarge Readiness: Expected Discharge Date: 09/16/23 Expected discharge location: Short Term Rehab Full CodeMedical decision making for this patient was high.Signed:Seif Teichert, PA7/18/20255:27 PM

## 2023-09-15 NOTE — Plan of Care
 Plan of Care Overview/ Patient StatusProblem: Adult Inpatient Plan of CareGoal: Plan of Care ReviewOutcome: Interventions implemented as appropriate Pt calm and cooperative. HOH. Hearing aids on bedside table. Denies pain, Watching tv. Swallows meds whole. Red urine in foley catheter. Foley care done. Barrier cream applied to bottom and L groin. Call light within reach, bed alarm on. Repostioned in bed.

## 2023-09-15 NOTE — Discharge Instructions
 Dear Lynwood Eden Fusi behalf of your entire care team, we would like to thank you for entrusting us  with your medical needs. It has been our pleasure to provide care for you during your hospitalization. Your care team has created a summary of your visit.Hospitalization:During your hospitalization, you had a primary diagnosis of Syncope anginosa (HC Code).  You were also treated for *** and your in-hospital treatment consisted of ***.Medication Changes:The following new medications or medication changes were made during your hospitalization:Medication: ***Reason for use or change: ***Medication: ***Reason for use or change: ***Medication: ***Reason for use or change: ***Medication: ***Reason for use or change: ***Outpatient Plan:After your discharge, you will follow-up with: ***You have the following outpatient studies ordered:  {Outpatient studies:55715} It is important to have these done in a timely manner.  These studies were ordered because ***Post-Discharge Team Follow-up: {PDT yes/no:57008}Pending Lab Studies:Please ask Josepha Rush, MD for the final results of the following inpatient pending studies:  Pending Lab Results   Order Current Status  [facility] Liquid CDx In process   Additional Instructions:***If you have any questions about your hospitalization, or encounter any issues with obtaining your prescriptions, medical equipment, or outpatient studies, please call [phone] or toll-free at [phone]).

## 2023-09-15 NOTE — Progress Notes
 [facility] Progress NoteHospital Medicine ServiceAttending Provider: Maude EMERSON Macintosh, MD  Location:  8830/8830-CHospital Day #10Subjective   Patient seen and examined on 09/15/2023.  CC: Syncope anginosa (HC Code)Interim History: No acute overnight events.  Today patient offers no new medical complaints.  Objective  Vitals:Temp:  [97.4 ?F (36.3 ?C)-98.6 ?F (37 ?C)] 97.4 ?F (36.3 ?C)Pulse:  [71-77] 71Resp:  [18] 18BP: (105-137)/(56-72) 137/72SpO2:  [98 %-99 %] 99 %Device (Oxygen Therapy): room airI/O's:Intake/Output Summary (Last 24 hours) at 09/15/2023 1215Last data filed at 09/15/2023 0600Gross per 24 hour Intake -- Output 1900 ml Net -1900 ml  Physical ExamConstitutional:     General: He is not in acute distress.   Appearance: He is not toxic-appearing. HENT:    Head: Normocephalic.    Mouth/Throat:    Mouth: Mucous membranes are moist. Eyes:    Extraocular Movements: Extraocular movements intact.    Conjunctiva/sclera: Conjunctivae normal. Cardiovascular:    Rate and Rhythm: Normal rate and regular rhythm.    Pulses: Normal pulses. Pulmonary:    Effort: Pulmonary effort is normal. No respiratory distress.    Breath sounds: Normal breath sounds. No stridor. No wheezing, rhonchi or rales. Abdominal:    General: Abdomen is flat. Bowel sounds are normal. There is no distension.    Palpations: Abdomen is soft.    Tenderness: There is no abdominal tenderness. There is no guarding. Musculoskeletal:       General: Normal range of motion.    Cervical back: Normal range of motion and neck supple.    Right lower leg: No edema.    Left lower leg: No edema. Skin:   General: Skin is warm and dry. Neurological:    General: No focal deficit present.    Mental Status: He is alert.  Labs:I have reviewed the patient's pertinent labs as resulted in the EMR.Peripheral Access:Periph IV 09/03/23 cephalic (thumb side), left over-the-needle catheter system 18 gauge Paramedic (Active)  Foley Catheter:UreTHral Catheter 09/08/23 22 Physician (Active) Foley order placed.Imaging:No results found. I reviewed lab results, microbiology, imaging, test results, and EKG in formulating this patient's plan of care.   Assessment: 89 y.o. male PMHx: HFpEF, CAD, BPH, status post chronic Foley's catheter, chronic kidney disease, bilateral hearing loss, GERD, hyperlipidemia, hypertension, abdominal aortic aneurysm, presented with chest pain, found to have small PE. Has UTI and hematuria. Also with incidental finding of sclerotic lesion of L femur and L ribs iso elevated PSA, c/f prostate malignancy.  Med Onc, Ortho, and Urology consulted.  Now started on Casodex .  Awaiting subacute rehab placement.   Plan: ? Metastatic prostate cancer:- Oncology consulted, appreciate recs- PSA: 140- Sclerotic bone lesions on imaging- Oncology discussed the case with the patient and his family and it was decided to defer biopsy and start treatment- f/u [facility] liquid biopsy- continue Casodex  50 mg daily- patient will need to be discharged with a supply of Casodex  from Meds to Beds to use at [facility].Hematuria / b/l hydronephrosis / Pseudomonas UTI: hematuria is possibly related to UTI vs. possible prostate cancer- Urology consulted, appreciate recs- s/p tobramycin  x 1 which pharmacy reported is sufficient to treat UTI in the setting of reduced renal clearance- s/p Foley cath placement- trend H&H as below- outpatient follow-up with UrologyAcute on chronic anemia:- s/p 1 unit pRBCs on 7/11- trend H&H- plan to transfuse for Hgb < 7.0Acute PE:- continue apixaban  5 mg BIDCKD / hyperkalemia:  renal function appears to be fluctuating around baseline- trend RFTs / electrolytes- avoid nephrotoxins as able- renally dose medications  as appropriate- continue sodium bicarb 650 mg TID- continue dietary potassium restriction- s/p insulin  and dextrose - s/p Lokelma - monitor on telemetry until hyperkalemia resolvesCAD / HLD: - s/p PCI in 2019- continue rosuvastatin  40 mg daily- restart Plavix  and discontinue aspirin  now that patient has been started on full anti-coagulation as per 2020 [facility] Expert Consensus Pathway for Anticoagulant and Antiplatelet Therapy  Comorbidities Comorbidities present on admission:Chronic Heart Failure with preserved ejection fraction, last EF: 65% 05/10/2023. Acute/Chronic AnemiaPMH of COVID  Secondary diagnoses occurring during hospitalization:HyperkalemiaHypocalcemiaHyponatremia F/E/N:- Diet Renal Non DialysisVTE PPx:  apixaban  (ELIQUIS ) tablet 5 mg  Medication Reconciliation: Completed: by Pharmacist Communication: Family:  , RN, and Case ManagementDischarge Readiness: Expected Discharge Date: 09/15/23 Expected discharge location: Short Term Rehab Full CodeMedical decision making for this patient was high.Signed:Myrene Bougher, PA7/18/202512:15 PM

## 2023-09-15 NOTE — Care Coordination-Inpatient
 Follow up call placed to patient Mark [name] daughter Mark [name] ph#[phone] we discussed the STR offer with the [facility], Mark [name] informed this Clinical research associate that she thought Mark [name] was going to be able to accommodate her father.This Clinical research associate shared with Mark [name] that [facility] rescinded there bed offer yesterday. We discussed the bed offer with the [facility] Mark [name] states that before making a decision she would like to reach out to [facility] admission director Mark [name], we are currently awaiting a return call.11:16pm Call back received from patient Mark [name] dtr Mark [name] she informed this Clinical research associate that she has spoken with [facility] admission rep Mark [name], they discuss the payment that was made, which may free up a STR offer. We are currently awaiting Mark [name] to resend there decline and offer a bed. 3:29pm Follow up call placed to daughter Mark [name] I informed her that [facility] has made patient Mark [name], is now a cooperate denial. Mark [name] informed this Clinical research associate that she is now in agreement with the STR offer with the [employer]. We are now initiating auth.Care management will continue to follow along with the patient's medical team as needed.Troy JonesTransition CoordinatorCare Management (YSC)

## 2023-09-15 NOTE — Plan of Care
 Problem: Adult Inpatient Plan of CareGoal: Plan of Care ReviewOutcome: Interventions implemented as appropriateGoal: Patient-Specific Goal (Individualized)Outcome: Interventions implemented as appropriateGoal: Absence of Hospital-Acquired Illness or InjuryOutcome: Interventions implemented as appropriateGoal: Optimal Comfort and WellbeingOutcome: Interventions implemented as appropriateGoal: Readiness for Transition of CareOutcome: Interventions implemented as appropriate Plan of Care Overview/ Patient Status0700-1900Pt is A&Ox3 disoriented to situation. On RA, no telemetry. Had no c/o pain. HOH. Renal non-HD diet, takes pills whole. Barrier cream applied to stage 2 on coccyx & excoriation to groin. Continent of bowels, last BM was today. Foley in place & draining bloody urine. Assist x1 in bed. Assist x1 OOB w/ RW, BA on. 18g to LUE is patent & flushed w/o difficulties. Safety & comfort measures maintained, call light within reach.

## 2023-09-15 NOTE — Other
 Medical Oncology Consult Note Assessment Assessment: Mark [name] is a 89 y.o. male admitted with Syncope anginosa [facility] Code), medical oncology consulted by Attending Provider: Nicolas Maude HERO., MD [phone] and seen on 09/14/23 for metastatic prostate cancer.Overall, the picture is consistent with metastatic prostate cancer. He has started bicalutamide  yesterday. I saw pt today for foundation 1 liquid biopsy collection in lieu of biopsy (patient not amenable). Plan is for pt to be discharged in coming days to rehab. Oncology team will sign off at this time.Plan: Inpatient Recommendations:- Appreciate medical team's management of UTI, hematuria, anemia, PE- Start bicalutamide  50mg  daily- Foundation 1 liquid biopsy collectedOutpatient Recommendations:- Referral placed to GU onc with Dr. Vermell Subjective Subjective: History of Present Illness / Interval History:Mark [name] is a 89 y.o. gentleman admitted with chest pain, found to have sclerotic bone lesions and elevated PSA suggestive of metastatic prostate cancer. Oncology is consulted for guidance on further management. Briefly, his initial [place]-PE showed a small PE and also potential rib metastases. Subsequent abd/pelv imaging showed a sclerotic left femoral lesion. PSA was measured and was 140.He feels well. He had a decrease in hemoglobin to 6.9 and hematuria (chronic indwelling foley, being treated for UTI, and now on anticoagulation for PE). Oncologic History: Outpatient Oncologist: TBDCancer Diagnosis + Stage: suspected metastatic prostate cancerDate of Diagnosis: this admissionTumor Profiling (if relevant): N/ACurrent Treatment + Last Administration Date: None yetPrior Treatment: NoneSocial History: Daughter [name] is primary caregiver.Social History[1] Objective Objective: Allergies:Allergies[2]Inpatient Scheduled Medications:Current Facility-Administered Medications Medication Dose Route Frequency Provider Last Rate Last Admin  apixaban  (ELIQUIS ) tablet 5 mg  5 mg Oral Q12H Al Mouslmani, Mhd Yassin, MD   5 mg at 09/14/23 0906  aspirin  EC delayed release tablet 81 mg  81 mg Oral Daily Gupta, Sameer, MD   81 mg at 09/14/23 0905  bicalutamide  (CASODEX ) tablet 50 mg  50 mg Oral Daily Tran, Thuy, MD   50 mg at 09/14/23 9093  chlorhexidine  gluconate (HIBICLENS /BETASEPT ) 4 % topical liquid   Topical (Top) Daily [place], Janelle, PA   Given at 09/14/23 9076  [Held by provider] clopidogreL  (PLAVIX ) tablet 75 mg  75 mg Oral Daily Al Mouslmani, Mhd Yassin, MD   75 mg at 09/09/23 9157  dextrose  10% injection 250 mL  25 g Intravenous Once Musco, Marc, PA      escitalopram  oxalate (LEXAPRO ) tablet 15 mg  15 mg Oral Daily Al Mouslmani, Mhd Yassin, MD   15 mg at 09/14/23 0906  ethyl alcohol  62 % nasal swab 1 Application  1 Application Nasal Q12H Pacini, Janelle, PA      gabapentin  (NEURONTIN ) capsule 200 mg  200 mg Oral Nightly Al Mouslmani, Mhd Yassin, MD   200 mg at 09/13/23 2120  lidocaine  4 % topical patch 1 patch  1 patch Transdermal Q24H Raliegh Salm, APRN   1 patch at 09/13/23 1358  polyethylene glycol (MIRALAX ) packet 17 g  17 g Oral Daily Mbame, Dione E, MD   17 g at 09/13/23 0859  rosuvastatin  (CRESTOR ) tablet 40 mg  40 mg Oral Daily Al Mouslmani, Mhd Yassin, MD   40 mg at 09/14/23 1014  senna (SENOKOT) tablet 8.6 mg  1 tablet Oral BID Mbame, Dione E, MD   8.6 mg at 09/13/23 2120  sodium bicarbonate  tablet 650 mg  650 mg Oral TID Al Mouslmani, Mhd Yassin, MD   650 mg at 09/14/23 1457  sodium chloride  0.9 % flush 3 mL  3 mL IV Push Q8H Al Mouslmani, Mhd Yassin, MD  3 mL at 09/14/23 1458 Inpatient PRN Medications: acetaminophen   650 mg Oral Q6H PRN  clonazePAM   0.25 mg Oral DAILY PRN  melatonin  6 mg Oral Nightly PRN  ondansetron  (ZOFRAN ) IV Push  4 mg IV Push Q6H PRN  sodium chloride   3 mL IV Push PRN for Line Care Vitals:Temp:  [97.9 ?F (36.6 ?C)-98.6 ?F (37 ?C)] 98.6 ?F (37 ?C)Pulse:  [68-74] 74Resp:  [18-20] 18BP: (105-119)/(56-63) 105/56SpO2:  [98 %] 98 % Wt: 09/03/23 82 kg 07/22/23 82 kg 06/10/23 81.6 kg 06/07/23 84.2 kg 05/29/23 82.7 kg 05/12/23 84 kg Physical ExamConstitutional:     General: He is not in acute distress.   Comments: Alert, but hard of hearing Pulmonary:    Effort: Pulmonary effort is normal. Neurological:    Mental Status: He is alert.  Laboratory:I have reviewed the patient's studies within the last 24 hours.Lab Results Component Value Date  WBC 6.5 09/14/2023  HGB 7.7 (L) 09/14/2023  HCT 25.90 (L) 09/14/2023  MCV 77.5 (L) 09/14/2023  PLT 310 09/14/2023 Lab Results Component Value Date  CREATININE 3.73 (H) 09/14/2023  BUN 45 (H) 09/14/2023  NA 136 09/14/2023  K 4.6 09/14/2023  CL 103 09/14/2023  CO2 20 09/14/2023 Lab Results Component Value Date  ALT 16 09/04/2023  AST 40 (H) 09/04/2023  GGT 30 07/24/2023  ALKPHOS 173 (H) 09/04/2023  BILITOT 0.3 09/04/2023 Microbiology (if applicable):Diagnostics Last 24 Hours:No results found.This patient's care has been/will be reviewed daily during Inpatient Medical Oncology Consult Rounds. Coverage: Weekdays: For questions regarding consult coverage for individual patients 8A-5P, please contact the '[facility] Oncology Consult' Mobile Heartbeat.Weekend/Evening:  Please refer to [place]. Thank you for involving the Medical Oncology in the care of this patient. Recommendations were communicated to the primary team. We will sign off.I spent a total of 55 minutes on the care of this patient. Signed:Wyoma Genson, MDPGY-4 Fellow, Hematology/OncologyYale Wesley Long Community Hospital  [1] Social HistoryTobacco Use  Smoking status: Former   Passive exposure: Never  Smokeless tobacco: Never Vaping Use  Vaping status: Never Used Substance Use Topics  Alcohol  use: Not Currently  Drug use: Never [2] AllergiesAllergen Reactions  Ace Inhibitors Cough

## 2023-09-16 LAB — BASIC METABOLIC PANEL
BKR ANION GAP: 12 (ref 7–17)
BKR BLOOD UREA NITROGEN: 43 mg/dL — ABNORMAL HIGH (ref 8–23)
BKR BUN / CREAT RATIO: 12.5 (ref 8.0–23.0)
BKR CALCIUM: 8.4 mg/dL — ABNORMAL LOW (ref 8.8–10.2)
BKR CHLORIDE: 104 mmol/L (ref 98–107)
BKR CO2: 21 mmol/L (ref 20–30)
BKR CREATININE DELTA: -0.05
BKR CREATININE: 3.43 mg/dL — ABNORMAL HIGH (ref 0.40–1.30)
BKR EGFR, CREATININE (CKD-EPI 2021): 16 mL/min/1.73m2 — ABNORMAL LOW (ref >=60)
BKR GLUCOSE: 103 mg/dL — ABNORMAL HIGH (ref 70–100)
BKR POTASSIUM: 5.1 mmol/L (ref 3.3–5.3)
BKR SODIUM: 137 mmol/L (ref 136–144)

## 2023-09-16 LAB — CBC WITHOUT DIFFERENTIAL
BKR WAM ANC (ABSOLUTE NEUTROPHIL COUNT): 3.06 x 1000/ÂµL (ref 2.00–7.60)
BKR WAM HEMATOCRIT: 25.8 % — ABNORMAL LOW (ref 38.50–50.00)
BKR WAM HEMOGLOBIN: 8 g/dL — ABNORMAL LOW (ref 13.2–17.1)
BKR WAM MCH: 24.1 pg — ABNORMAL LOW (ref 27.0–33.0)
BKR WAM MCHC: 31 g/dL (ref 31.0–36.0)
BKR WAM MCV: 77.7 fL — ABNORMAL LOW (ref 80.0–100.0)
BKR WAM MPV: 9.3 fL (ref 8.0–12.0)
BKR WAM PLATELETS: 318 x1000/ÂµL (ref 150–420)
BKR WAM RDW-CV: 17.1 % — ABNORMAL HIGH (ref 11.0–15.0)
BKR WAM RED BLOOD CELL COUNT.: 3.32 M/ÂµL — ABNORMAL LOW (ref 4.00–6.00)
BKR WAM WHITE BLOOD CELL COUNT: 5.6 x1000/ÂµL (ref 4.0–11.0)

## 2023-09-16 LAB — PHOSPHORUS     (BH GH L LMW YH): BKR PHOSPHORUS: 4.6 mg/dL — ABNORMAL HIGH (ref 2.2–4.5)

## 2023-09-16 NOTE — Care Coordination-Inpatient
 Updated clinical info sent to [facility] portal; will follow for determination.Ref # [id number]

## 2023-09-16 NOTE — Plan of Care
 Plan of Care Overview/ Patient StatusAssumed care of patient at 0700-1900. Scheduled medication given per [facility], swallowed medication whole with water . Patient is resting in bed with call bell in reach. Bed alarm active/audible. Safety rounding continued.Neuro: Extremely HOH, amplifier provided. A/Ox4. Afebrile. Calm, cooperative and able to make needs known. Pain assessment: PRN given with good affectCardiac:VSS. No telemetry this shift Respiratory: SPO2 goal maintained on RAGI/GU: Foley in place draining red/amber urine. Incontinent of bowel, care provided and cream applied. LBM: 7/19Diet: tolerating renal diet with good appetiteSkin: moisture dermatitis to groin, care provided and cream appliedMobility: Ax1 OOB with RWIV Access/Drains: Left 18G PIV, patent, saline locked and capped.Alfonso Gang, RNProblem: Adult Inpatient Plan of CareGoal: Plan of Care ReviewOutcome: Interventions implemented as appropriate

## 2023-09-16 NOTE — Progress Notes
 ORTHOPAEDIC SURGERY Patient name:  Jamauri Kruzel MRN:	  [id number] Date of birth:	  01-Nov-1927Date of admit:  7/6/2025Attending of record for this patient: Mbame, Dione E, MD   Provider leaving this note: Frederich Riis, MDInitial ConsultationReason for consultation:L hip pain/lesion at L femur IT region -------------------------------------------------------------------------------------------------------------------------------------------------------------------------------------------Assessment and Plan of Care:  Winslow Ederer is a 89 y.o. male with new finding of lesion at left femur IT region concerning for possible metastatic prostate cancer. Patient's family deferred biopsy. Patient is currently asymptomatic and will be followed-up outpatient. Inpatient Recommendations:-No active in patient orthopedic intervention; follow-up outpatient- F/u onc recs-Activity: WBAT with cane or walker-Diet: per primary team-DVT: per primary team-Dispo: per primaryOutpatient Recommendations [work-up or treatments that can happen AFTER discharge] :  - Follow-up appointment scheduled on 7/19 with Dr. Seferino Recommendations: From this consultant's standpoint, the patient may be discharged when cleared by primary.We will arrange follow-up for the patient in our outpatient clinic.Will Discuss with the Ortho Onc teamThe patient was evaluated independently.We will sign off.  Please reconsult if needed.Please contact [facility] with any additional questions.SABRA ---------------------------------------------------------------------------------------------------------------------------------------------------------------------------------------------History of the Present IllnessJames Humm is a 89 y.o. male with PMHx: HFpEF, CAD, BPH, status post chronic Foley's catheter, chronic kidney disease, bilateral hearing loss, GERD, hyperlipidemia, hypertension, abdominal aortic aneurysm. Patient was found to have sclerotic bone lesions at left intertrochanteric region. Work up showed elevated PSA concerning for possible metastatic prostate cancer. At evaluation today, patient reports having no pain and states he is doing well. Denied any numbness and tingling. Ambulatory status pre-injuryWalker OccupationretiredPMHPast Medical History: Diagnosis Date  (HFpEF) heart failure with preserved ejection fraction (HC Code)    AAA (abdominal aortic aneurysm) (HC Code) 05/05/2015  Formatting of this note might be different from the original.  2016 3.6 cm US  in [place], 2018 4 cm, no further f/u recommended by Vasc    Allergic rhinitis 07/24/2019  Bilateral hearing loss 01/28/2019  BPH (benign prostatic hyperplasia)   CAD (coronary artery disease) 05/05/2015  Carotid artery stenosis 06/07/2016  Chronic coronary artery disease   Chronic sinusitis 07/24/2019  CKD (chronic kidney disease) stage 4, GFR 15-29 ml/min (HC Code)    Gastroesophageal reflux disease without esophagitis 05/05/2015  GERD (gastroesophageal reflux disease)   History of coronary artery stent placement 06/07/2016  Hyperlipidemia   Hypertension   Microcytic anemia   Mixed hyperlipidemia 07/24/2019  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  PSHPast Surgical History[1]AllergiesAllergies[2]Outpatient MedicationsCurrent Outpatient Medications Medication Instructions  acetaminophen  (TYLENOL ) 650 mg, Oral, EVERY 6 HOURS PRN  aspirin  81 mg, Oral, Daily  atorvastatin  (LIPITOR) 80 mg, Oral, Daily  b complex vitamins (B COMPLEX) tablet 1 tablet, Daily  bicalutamide  (CASODEX ) 50 mg, Oral, Daily  camphor-menthoL  (SARNA) 0.5-0.5 % lotion Topical (Top), 2 TIMES DAILY PRN  clonazePAM  (KLONOPIN ) 0.5 mg, DAILY PRN  clopidogreL  (PLAVIX ) 75 mg, Oral, Daily  escitalopram  oxalate (LEXAPRO ) 10 mg, Oral, Daily  escitalopram  oxalate (LEXAPRO ) 5 mg, Every Morning  gabapentin  (NEURONTIN ) 200 mg, Oral, At bedtime  HYDROcodone -acetaminophen  (NORCO) 5-325 mg per tablet 1 tablet, EVERY 6 HOURS PRN  nitroGLYCERIN  (NITROSTAT ) 0.4 mg, PRN  polyethylene glycol (GLYCOLAX ; MIRALAX ) 17 g, DAILY PRN  senna (SENOKOT) 17.2 mg, Oral, 2 Times Daily Scheduled  sodium bicarbonate  650 mg, Oral, 3 Times Daily Scheduled Illicit Drug UseSocial History Substance and Sexual Activity Drug Use Never SmokingTobacco Use: Medium Risk (08/16/2023)  Patient History   Smoking Tobacco Use: Former   Smokeless  Tobacco Use: Never   Passive Exposure: Never Review of systemsPertinent review of systems per above HPIRest of 12 point review of system noncontributoryPhysical ExaminationVitals:  09/16/23 1146 BP: (!) 134/94 Pulse: (!) 95 Resp: (!) 24 Temp:  Constitutional:NAD, Awake, AlertPsychiatric:Normal Mood/AffectNeurologic:Aox4Cardiac:Regular rate on peripheral palpationPulmonary:Breathing comfortable on RAGI:Abdomen SoftGU:DeferredLLE:Skin intactNo tenderness with palpation Sensation present grossly tn/sp/dp+ehl/fhl/ta/gastrosoleus/peronealstoes warm and well perfused, palpable DP pulse, cap refill <2sAble to straight leg raiseContactEbunoluwa Yisroel, MDReachable by MHBOutpatient Follow-upY[phone]  [1] Past Surgical History:Procedure Laterality Date  APPENDECTOMY    BLADDER SURGERY  03/01/2019  CHOLECYSTECTOMY    FRACTURE SURGERY    rifgt hand [2] AllergiesAllergen Reactions  Ace Inhibitors Cough

## 2023-09-16 NOTE — Progress Notes
 Progress NoteAttending Provider: Mbame, Dione E, MDSubjective: No acute concerns. No chest pain, no abdominal pain, no shortness of breathObjective: Vitals:Temp:  [97.8 ?F (36.6 ?C)-98.4 ?F (36.9 ?C)] 97.9 ?F (36.6 ?C)Pulse:  [70-95] 75Resp:  [18-24] 20BP: (91-150)/(53-94) 116/58SpO2:  [96 %-100 %] 98 %Device (Oxygen Therapy): room air I/O's:Intake/Output Summary (Last 24 hours) at 09/16/2023 1514Last data filed at 09/16/2023 1154Gross per 24 hour Intake 360 ml Output 1800 ml Net -1440 ml  Physical Exam:General: No acute distress, well nourished, appropriate for stated ageHEENT: normocephalic, atraumatic, moist oral mucosa without exudate, no rhinorrhea, EOMICV/Chest: Normal S1/S2, RRR, no m/r/gPulm: CTABAbdomen: Soft, non-distended, non-tender, normal bowel soundsMSK: 2+ pulses radial and DP and PTSkin: No ecchymosis notedNeuro: A&Ox3, No focal deficit notedPsych: Mood normal, behavior normal Labs: CBC/BMP: Recent Labs Lab 07/16/250442 07/17/250510 07/17/251314 07/18/251400 07/19/250500 WBC 7.2 6.5  --  5.6 5.6 HGB 7.8* 7.7*  --  8.4* 8.0* PLT 287 310  --  344 318 NA 135* 136  --  136 137 K 5.1 5.6*   < > 5.3 5.1 CO2 21 20  --  20 21 ANIONGAP 11 13  --  12 12 BUN 41* 45*  --  44* 43* CREATININE 3.61* 3.73*  --  3.48* 3.43*  < > = values in this interval not displayed. Dereck Chancy, LFTs: Recent Labs Lab 07/13/250552 07/13/251552 07/14/250555 07/15/250441 07/16/250442 07/17/250510 07/17/251314 07/18/251400 07/19/250500 CALCIUM  8.6*   < > 8.4*   < > 8.3* 8.4*  --  8.4* 8.4* MG 2.1   < > 2.0  --   --   --   --   --   --  PHOS 4.2  --  4.5  --   --   --    < >  --  4.6* ALBUMIN  3.2*  --   --   --   --   --   --   --   --   < > = values in this interval not displayed. Glucose Trend: Recent Labs Lab 07/17/250920 07/17/251022 07/17/251130 07/17/251244 07/18/251400 07/19/250500 GLU 98 178* 131* 102* 104* 103* Diagnostics:No results found. Assessment & Plan: 89 y.o. male PMHx: BPH, s/p chronic Foley catheter who presented with chest pain and was found to have small PE, UTI with bilateral hydronephrosis and hematuria, and new diagnosis of metastatic prostate cancer in setting of incidental finding of sclerotic lesion of L femur and L ribs and elevated PSA (no biopsy performed). Metastatic prostate cancer:Bone lesions in pelvis with PSA 140. Oncology discussed the case with the patient and his family and it was decided to defer biopsy and start treatment- f/u [facility] liquid biopsy- continue Casodex  50 mg daily- patient will need to be discharged with a supply of Casodex  from [facility] to [facility] to use at [facility].- Dispo pending Oncologic Orthopedics team and PT evaluation to comment on WB capacity.#Hematuria#Bilateral Hydronephrosis#Pseudomonas UTIHematuria is possibly related to UTI vs. prostate cancer- Urology consulted, appreciate recs- s/p tobramycin  x 1 which pharmacy reported is sufficient to treat UTI in the setting of reduced renal clearance- s/p Foley cath placement- trend H&H as below- Urology recommended treating Pseudomonas UTI- Plan discussed with pharmacy for 14-day course of cipro  starting 07/19- reached out to anti-microbial stewardship to check if we can switch to another abx given risk of Cdiff and tendon rupture with cipro .- outpatient follow-up with Urology for voiding trial Acute on chronic anemia:- s/p 1 unit pRBCs on 7/11- trend H&H- plan to transfuse for Hgb < 7.0 Acute PE:- continue apixaban  5 mg BIDCKD /  hyperkalemia:  renal function appears to be fluctuating around baseline- trend RFTs / electrolytes- avoid nephrotoxins as able- renally dose medications as appropriate- continue sodium bicarb 650 mg TID- continue dietary potassium restriction- s/p insulin  and dextrose - s/p Lokelma - monitor on telemetry until hyperkalemia resolves CAD / HLD: - s/p PCI in 2019- continue rosuvastatin  40 mg daily- restart Plavix  and discontinue aspirin  now that patient has been started on full anti-coagulation as per 2020 [facility] Expert Consensus Pathway for Anticoagulant and Antiplatelet TherapyDIET: RenalPPX: eliquisDISPO: pending PT and Oncologic Orthopedics team re-evalCODE STATUS: Full CodeSigned:Draco Malczewski [place] MDInternal Medicine PGY-37/19/2025

## 2023-09-16 NOTE — Plan of Care
 Plan of Care Overview/ Patient StatusInsurance is requesting for new PT/OT noteOrtho evaluation with WB status of LLECM notified Rehab and provider Katie Ned RN,BSNCare Manager

## 2023-09-16 NOTE — Plan of Care
 Plan of Care Overview/ Patient Status1900-0700Problem: Adult Inpatient Plan of CareGoal: Plan of Care ReviewOutcome: Interventions implemented as appropriate Resting in bed . HOH. Oriented X3. VS wnl. Painless hematuria. Foley cath in place. Catheter care provided.  Turning & repositioned 2-3h. Inc dermatitis on buttocks, skin barrier applied. Slept fairly. Rounds performed. Bed alrm ON, safety maintained.Repositioned. DTI right buttock. Skin barrier applied.  Wedge for repositioning. Large bm . Inc care provided. Hematuria persists.Labs drawn. See  results. Will continue to monitor H/H.

## 2023-09-16 NOTE — Plan of Care
 Inpatient Physical Therapy Progress Note IP Adult PT Eval/Treat - 09/16/23 1644    Date of Visit / Treatment  Date of Visit / Treatment 09/16/23   Note Type Progress Note   Start Time 1619   End Time 1644   Total Treatment Time 25    General Information  Subjective patient agreeable to PT   General Observations patient received in bed on RA in NAD on 8-8. PT session cleared by RN.   Precautions/Limitations Fall Precautions;Bed alarm;Chair alarm   Precautions/Limitations Comment per ortho: WBAT with cane or walker    Weight Bearing Status  Weight Bearing Status Comments WBAT with cane or walker    Vital Signs and Orthostatic Vital Signs  Vital Signs Vital Signs Stable   Vital Signs Free text per chart    Pain/Comfort  Location #1 - PreTreatment Rating (Numbers Scale) 0/10 - no pain   Posttreatment Rating (Numbers Scale) 0/10 - no pain   Pain Comment (Pre/Post Treatment Pain) denies pain    Cognition  Orientation Level Oriented to person;Oriented to place;Oriented to time   Level of Consciousness alert   Following Commands Follows one step commands with repetition;Follows one step commands with increased time   Administrator, arts / Judgment Fall risk   Cognition Comments cooperative and appropriately conversing / following commands    Vision/ Hearing  Hearing difficulties / Use of hearing aids HOH - amplifier utilized    Skin Assessment  Skin Assessment See Nursing Documentation    Balance  Sitting Balance: Static  FAIR-      Contact Guard to maintain static position with no Assistive Device   Sitting Balance: Dynamic  POOR+   Moves through 1/2 range with minimal assist to right self   Standing Balance: Static POOR      Moderate assist to maintain static position with no Assistive Device   Standing Balance: Dynamic  POOR     Moves through 1/4 to 1/2 ROM range with moderate assist to right self   Balance Assist Device Hand held assist   Balance Skills Training Comment Ax2    Bed Mobility  Supine-to-Sit Independence/Assistance Level Minimum assist   Supine-to-Sit Assist Device Head of bed elevated   Sit-to-Supine Independence/Assistance Level Minimum assist   Sit-to-Supine Assist Device Hand held assist   Bed Mobility Comments cues for sequencing    Sit-Stand Transfer Training  Sit-to-Stand Transfer Independence/Assistance Level Moderate assist   Sit-to-Stand Transfer Assist Device Rolling walker   Stand-to-Sit Transfer Independence/Assistance Level Moderate assist   Stand-to-Sit Transfer Assist Device Rolling walker   Sit-Stand Transfer Comments unsteady initially in stance   Stand-Sit Transfer Comments decr eccentric control    Gait Training  Independence/Assistance Level  Moderate assist   Assistive Device  Rolling walker   Gait Distance sidesteps   Gait Analysis Deviations decreased cadence;decreased step length;decreased weight-shifting ability   Ambulation distance was limited by: fatigue   Gait Training Comments verbal cues for sequencing, unsteady with limited standing tolernace    Handoff Documentation  Handoff Patient in bed;Bed alarm;Patient instructed to call nursing for mobility;Discussed with nursing;Chair position of bed    Activity Tolerance  Activity Tolerance Comments fair    PT- AM-PAC - Basic Mobility Screen- How much help from another person do you currently need.....  Turning from your back to your side while in a a flat bed without using rails? 3 - A Little - Requires a little help (supervision, minimal assistance). Can use assistive devices.   Moving from lying  on your back to sitting on the side of a flat bed without using bed rails? 2 - A Lot - Requires a lot of help (maximum to moderate assistance). Can use assistive devices.   Moving to and from a bed to a chair (including a wheelchair)? 2 - A Lot - Requires a lot of help (maximum to moderate assistance). Can use assistive devices.   Standing up from a chair using your arms(e.g., wheelchair or bedside chair)? 2 - A Lot - Requires a lot of help (maximum to moderate assistance). Can use assistive devices.   To walk in a hospital room? 1 - Total - Requires total assistance or cannot do it at all.   Climbing 3-5 steps with a railing? 1 - Total - Requires total assistance or cannot do it at all.   AMPAC Mobility Score 11   TARGET Highest Level of Mobility Mobility Level 4, Transfer to chair   ACTUAL Highest Level of Mobility Mobility Level 5, Stand for 1 minute    Therapeutic Exercise  Therapeutic Exercise Comments AROM/AAROM for BUE/BLE to tolerance    Therapeutic Functional Activity  Therapeutic Functional Activity Comments bed mobility, transfer training, sitting balance/ tolerance, patient education / reorientation    Clinical Impression  Follow up Assessment patient seen for f/u session. patient tolerated sidesteps at edge of bed with mod A and RW with unsteadiness and reduced standing tolerance noted. patient will continue to benefit from skilled PT to address acute deficits and return to PLOF. PT rec for moderate complexity post acute PT needs.    Patient/Family Stated Goals  Patient/Family Stated Goal(s) get stronger    Frequency/Equipment Recommendations  PT Frequency 3x per week   Next Treatment Expected 09/19/23   PT/PTA completing this assessment Ketra Duchesne    PT Recommendations for Inpatient Admission  Activity/Level of Assist transfers only;assist of 2;with rolling walker;in room    PT Discharge Summary  Physical Therapy Disposition Recommendation Moderate complexity support and therapy to progress functional mobility/ ADLs/ IADLs recommended for post- acute care.  See assessment for additional details.   Additional Therapy Recommendations Physical Therapy Services in Discharge Environment    Metta Athens, DPT

## 2023-09-17 LAB — CBC WITH AUTO DIFFERENTIAL
BKR WAM ABSOLUTE IMMATURE GRANULOCYTES.: 0.03 x 1000/ÂµL (ref 0.00–0.30)
BKR WAM ABSOLUTE LYMPHOCYTE COUNT.: 1.59 x 1000/ÂµL (ref 0.60–3.70)
BKR WAM ABSOLUTE NRBC: 0 x 1000/ÂµL (ref 0.00–1.00)
BKR WAM ANC (ABSOLUTE NEUTROPHIL COUNT): 3.38 x 1000/ÂµL (ref 2.00–7.60)
BKR WAM BASOPHIL ABSOLUTE COUNT.: 0.06 x 1000/ÂµL (ref 0.00–1.00)
BKR WAM BASOPHILS: 0.9 % (ref 0.0–1.4)
BKR WAM EOSINOPHIL ABSOLUTE COUNT.: 0.85 x 1000/ÂµL (ref 0.00–1.00)
BKR WAM EOSINOPHILS: 12.8 % — ABNORMAL HIGH (ref 0.0–5.0)
BKR WAM HEMATOCRIT: 27.5 % — ABNORMAL LOW (ref 38.50–50.00)
BKR WAM HEMOGLOBIN: 8.3 g/dL — ABNORMAL LOW (ref 13.2–17.1)
BKR WAM IMMATURE GRANULOCYTES: 0.5 % (ref 0.0–1.0)
BKR WAM LYMPHOCYTES: 23.9 % (ref 17.0–50.0)
BKR WAM MCH: 23.4 pg — ABNORMAL LOW (ref 27.0–33.0)
BKR WAM MCHC: 30.2 g/dL — ABNORMAL LOW (ref 31.0–36.0)
BKR WAM MCV: 77.7 fL — ABNORMAL LOW (ref 80.0–100.0)
BKR WAM MONOCYTE ABSOLUTE COUNT.: 0.73 x 1000/ÂµL (ref 0.00–1.00)
BKR WAM MONOCYTES: 11 % (ref 4.0–12.0)
BKR WAM MPV: 9.6 fL (ref 8.0–12.0)
BKR WAM NEUTROPHILS: 50.9 % (ref 39.0–72.0)
BKR WAM NUCLEATED RED BLOOD CELLS: 0 % (ref 0.0–1.0)
BKR WAM PLATELETS: 399 x1000/ÂµL (ref 150–420)
BKR WAM RDW-CV: 17.2 % — ABNORMAL HIGH (ref 11.0–15.0)
BKR WAM RED BLOOD CELL COUNT.: 3.54 M/ÂµL — ABNORMAL LOW (ref 4.00–6.00)
BKR WAM WHITE BLOOD CELL COUNT: 6.6 x1000/ÂµL (ref 4.0–11.0)

## 2023-09-17 MED ORDER — PIPERACILLIN-TAZOBACTAM (ZOSYN) 2.25GM MBP
Freq: Four times a day (QID) | INTRAVENOUS | Status: DC
Start: 2023-09-17 — End: 2023-09-21
  Administered 2023-09-17 – 2023-09-21 (×16): 50.000 mL/h via INTRAVENOUS

## 2023-09-17 NOTE — Care Coordination-Inpatient
 Attempted to reach pt's dtr to review IM, no answer and unable to leave message.Care management will continue to follow along with the patient's medical team as needed.[facility] [employer]

## 2023-09-17 NOTE — Plan of Care
 Plan of Care Overview/ Patient Status0700 - 1900   Pt A/O X 4. VSS on RA. Lethargic. Denies CP, SOB, N/V. No sign of cardiac/resp distress noted. C/O headache, PRN Tylenol  given. Chronic foley in place draining bloody urine, provider aware. AX 1 in bed. AX1 with walker OOB. Meds given per MAR. PIV patent and intact, IV zosyn  initiated.safety maintained. Bed alarm set. Will cont to monitor

## 2023-09-17 NOTE — Care Coordination-Inpatient
 INSURANCE AUTH OBTAINED FOR STR 09/15/23 1551 Authorization Information Date Authorization Initiated:  09/15/23 Time Authorization Initiated: 1551 Mode Clinical was sent: Portal Facility Name [facility] Facility Authorization yes Insurance Company [facility] Medicare Insurance Co. Contact Name/# UHC/Home and [facility] Authorization #/Details Auth #: [id number], Home and [facility] auth #: [id number], for admission to [facility] 7/19-7/22/25; update due on 09/19/23 Date Authorization recieved 09/17/23 Time Authorization recieved 0726 Follow up contact necessary Yes Contact Name UHC/Home and Community Care Contact phone: [phone] Insurance Co. Fax number: [phone]

## 2023-09-17 NOTE — Plan of Care
 Plan of Care Overview/ Patient StatusProblem: Adult Inpatient Plan of CareGoal: Plan of Care ReviewOutcome: Interventions implemented as appropriate 1900-0700 Bedrest. Oriented X4. HOH, device @ bedside,bilaterally charge. No sob. Denies pain. Visited by daughter, supportive to pt.  Foley cath in place. Painless hematuria. Cath care done. T&P Q2-3h. Wedge in use. Hygiene performed. Skin barrier applied. Positioned off  dti right buttocks. Slept fairly. Rounds performed. Call light w/in reach. Bed alarm set. Incontinent care performed. Barrier applied to groins & buttocks. Hematuria persists. Denies pain. Will monitor.

## 2023-09-17 NOTE — Plan of Care
 Plan of Care Overview/ Patient StatusInsurance approved. Waiting to hear back from Facility on bed availability for [name]

## 2023-09-17 NOTE — Plan of Care
 Plan of Care Overview/ Patient StatusPt medically cleared for dc to STR. Bed is secured and ready at [facility]. PASRR and insurance approved. Daughter [name] agreeable on dc.RN to do call report with the facility.BA to arrange ambulance for dc transportation.W10 updated  Addendum at 1.23 PM Per pt has to continue 13 days of IV antibiotics.Pt has no PICC line.Checking with facility if they can administer through peripheral line. Addendum at 2.16 pmPer provider pt is not ready today.He has hemauria .Daughter made aware.Case Management Plan  Flowsheet Row Most Recent Value Discharge Planning  Patient/Patient Representative was presented with a list of facilities, agencies and/or dme providers and Referral(s) placed for: Short term rehabilitation (at a nursing facility) Bed has been secured and is available on: 09/17/23 Facility Name [facility] Mode of Transportation  Ambulance (add comment for special considerations) Discharge Readiness  PASRR completed and approved Yes Authorization number obtained, if required Yes Is there a 3 day INPATIENT Qualifying stay for Medicare Patients? N/A DME Authorized/Delivered N/A No needs identified/ follow up with PCP/MD/Outpatient Provider Yes Post acute care services secured W10 complete Yes PRI Completed and Accepted ([place]  Patients Only) N/A Is the destination address correct on the W10 Yes Last Bowel Movement 09/17/23 Finalized Plan  Expected Discharge Date 09/17/23 Discharge Disposition Skilled Nursing Facility  Ranay Ketter [facility] RN,BSNCare Manager

## 2023-09-18 ENCOUNTER — Telehealth: Admit: 2023-09-18 | Payer: PRIVATE HEALTH INSURANCE | Primary: Internal Medicine

## 2023-09-18 LAB — CBC WITH AUTO DIFFERENTIAL
BKR WAM ABSOLUTE IMMATURE GRANULOCYTES.: 0.02 x 1000/ÂµL (ref 0.00–0.30)
BKR WAM ABSOLUTE LYMPHOCYTE COUNT.: 1.24 x 1000/ÂµL (ref 0.60–3.70)
BKR WAM ABSOLUTE NRBC: 0 x 1000/ÂµL (ref 0.00–1.00)
BKR WAM ANC (ABSOLUTE NEUTROPHIL COUNT): 4.23 x 1000/ÂµL (ref 2.00–7.60)
BKR WAM BASOPHIL ABSOLUTE COUNT.: 0.06 x 1000/ÂµL (ref 0.00–1.00)
BKR WAM BASOPHILS: 0.9 % (ref 0.0–1.4)
BKR WAM EOSINOPHIL ABSOLUTE COUNT.: 0.82 x 1000/ÂµL (ref 0.00–1.00)
BKR WAM EOSINOPHILS: 11.7 % — ABNORMAL HIGH (ref 0.0–5.0)
BKR WAM HEMATOCRIT: 25.4 % — ABNORMAL LOW (ref 38.50–50.00)
BKR WAM HEMOGLOBIN: 7.9 g/dL — ABNORMAL LOW (ref 13.2–17.1)
BKR WAM IMMATURE GRANULOCYTES: 0.3 % (ref 0.0–1.0)
BKR WAM LYMPHOCYTES: 17.6 % (ref 17.0–50.0)
BKR WAM MCH: 24.1 pg — ABNORMAL LOW (ref 27.0–33.0)
BKR WAM MCHC: 31.1 g/dL (ref 31.0–36.0)
BKR WAM MCV: 77.4 fL — ABNORMAL LOW (ref 80.0–100.0)
BKR WAM MONOCYTE ABSOLUTE COUNT.: 0.66 x 1000/ÂµL (ref 0.00–1.00)
BKR WAM MONOCYTES: 9.4 % (ref 4.0–12.0)
BKR WAM MPV: 9.7 fL (ref 8.0–12.0)
BKR WAM NEUTROPHILS: 60.1 % (ref 39.0–72.0)
BKR WAM NUCLEATED RED BLOOD CELLS: 0 % (ref 0.0–1.0)
BKR WAM PLATELETS: 331 x1000/ÂµL (ref 150–420)
BKR WAM RDW-CV: 17.2 % — ABNORMAL HIGH (ref 11.0–15.0)
BKR WAM RED BLOOD CELL COUNT.: 3.28 M/ÂµL — ABNORMAL LOW (ref 4.00–6.00)
BKR WAM WHITE BLOOD CELL COUNT: 7 x1000/ÂµL (ref 4.0–11.0)

## 2023-09-18 LAB — BASIC METABOLIC PANEL
BKR ANION GAP: 12 (ref 7–17)
BKR BLOOD UREA NITROGEN: 42 mg/dL — ABNORMAL HIGH (ref 8–23)
BKR BUN / CREAT RATIO: 12.6 (ref 8.0–23.0)
BKR CALCIUM: 8.3 mg/dL — ABNORMAL LOW (ref 8.8–10.2)
BKR CHLORIDE: 105 mmol/L (ref 98–107)
BKR CO2: 21 mmol/L (ref 20–30)
BKR CREATININE DELTA: -0.1
BKR CREATININE: 3.33 mg/dL — ABNORMAL HIGH (ref 0.40–1.30)
BKR EGFR, CREATININE (CKD-EPI 2021): 16 mL/min/1.73m2 — ABNORMAL LOW (ref >=60)
BKR GLUCOSE: 105 mg/dL — ABNORMAL HIGH (ref 70–100)
BKR POTASSIUM: 4.9 mmol/L (ref 3.3–5.3)
BKR SODIUM: 138 mmol/L (ref 136–144)

## 2023-09-18 MED ORDER — PIPERACILLIN-TAZOBACTAM (ZOSYN) 2.25GM MBP
Freq: Four times a day (QID) | INTRAVENOUS | 1.00 refills | Status: AC
Start: 2023-09-18 — End: 2023-09-21

## 2023-09-18 MED ORDER — BICALUTAMIDE 50 MG TABLET
50 | ORAL_TABLET | Freq: Every day | ORAL | 1 refills | 30.00000 days | Status: AC
Start: 2023-09-18 — End: ?

## 2023-09-18 MED ORDER — LIDOCAINE 4 % TOPICAL PATCH
4 | TRANSDERMAL | 1.00 refills | 30.00000 days | Status: AC
Start: 2023-09-18 — End: 2023-09-22

## 2023-09-18 MED ORDER — APIXABAN 5 MG TABLET
5 | Freq: Two times a day (BID) | ORAL | 2.50 refills | 30.00000 days | Status: AC
Start: 2023-09-18 — End: 2023-09-22

## 2023-09-18 NOTE — Telephone Encounter
 Patient needs:Next available MD or APP Visit for hematuria follow up and nursing visit for foley exchange within 3 weeks. In Person Visit / Must be Face to Face. Future Appointments     Provider Department Center  09/26/2023 10:00 AM (Arrive by 9:45 AM) Manette Arley BRAVO, MD [facility] at [address] [facility]  10/05/2023 3:15 PM Vermell Ozell BRAVO, MD [facility] [place] [facility]

## 2023-09-18 NOTE — Progress Notes
 [facility] Progress NoteHospital Medicine ServiceAttending Provider: Maude EMERSON Macintosh, MD  Location:  8830/8830-CHospital Day #13Subjective   Patient seen and examined on 09/18/2023.CC: Syncope anginosa (HC Code)Interim History: Patient seen and examined at the bedside. Hemodynamically stable. No acute events overnight. No new complaints.  Objective  Vitals:Temp:  [97.5 ?F (36.4 ?C)-98.4 ?F (36.9 ?C)] 98 ?F (36.7 ?C)Pulse:  [73-78] 73Resp:  [16-20] 18BP: (120-138)/(61-84) 120/61SpO2:  [94 %-99 %] 94 %Device (Oxygen Therapy): room airI/O's:Intake/Output Summary (Last 24 hours) at 09/18/2023 1635Last data filed at 09/18/2023 0536Gross per 24 hour Intake 103.42 ml Output 1475 ml Net -1371.58 ml  Physical Exam General: Well-appearing, in no acute distressNeuro: AO x 3Head: Normocephalic and atraumaticEyes: PERRLA, intact EOMs, sclera anicteric/without injectionOropharynx: Mucous membranes are moistCardiovascular: RRR Lungs: Normal breath sounds, CTA across lung fieldsAbdomen: Non-distended, non-tender, soft, normoactive bowel soundsExtremities: No cyanosis or edemaGU: Foley catheter draining pink clear urine without clots Labs:I have reviewed the patient's pertinent labs as resulted in the EMR.CBC Last 24hrs: WBC/Hgb/Hct/Plts:  7.0/7.9/25.40/331 (07/21 0519)Lytes Last 24hrs: Na/K/Cl/CO2:  138/4.9/105/21 (07/21 0519)Chem Last 24hrs: BUN/Cr/GLU/ALT/AST/AMYLASE/LIPASE:  42/3.33/105/--/--/--/-- (07/21 0519)Coags Last 24hrs:  Troponin No results for input(s): TROPTHS in the last 72 hours.Peripheral Access:Periph IV 09/03/23 cephalic (thumb side), left over-the-needle catheter system 18 gauge Paramedic (Active)  Foley Catheter:UreTHral Catheter 09/08/23 22 Physician (Active) Foley order placed.Imaging:No results found. I reviewed test results in formulating this patient's plan of care.   Assessment: 89 y.o. male PMHx significant for BPH, s/p chronic Foley catheter who presented with chest pain and was found to have small PE, UTI with bilateral hydronephrosis and hematuria, and new diagnosis of metastatic prostate cancer in setting of incidental finding of sclerotic lesion of L femur and L ribs and elevated PSA (no biopsy performed).   Plan: #Metastatic prostate ca-Consultants: Oncology -Bone lesions found on pelvic imaging-PSA 140 -Follow up [facility] liquid biopsy-Patient to defer biopsy at this time and started Casodex  50mg  daily -Per ortho - activity: WBAT with cane or walker -Patient was initially set to discharge to STR by patient's daughter opting for home with services to spend more time with her father -To follow up with outpatient orthopedics 7/29#Hematuria#Bilateral hydronephrosis#Pseudomonas UTI-Consultants: Urology-S/p Foley cath placement -Hematuria likely 2/2 UTI versus prostate cancer -Patient did receive 1 unit of pRBCs this hospital stay (7/11 for Hb of 6.9). Hb 7.9 this morning, which is slightly down-trending from yesterday but overall stable -Patient status post 1x Tobramycin  dose. To receive 7 more days of abx - remains on Zosyn  for which he has 3 more days to complete before discharge. Initially was able to discharge on Zosyn  to STR but patient's family now opting for return home. Patient's UTI is suceptible to Cipro  however risks of achilles tendon rupture and c diff colitis will favor Zosyn  -Continue to follow up H/H-Will follow up with Urology as an outpatient for voiding trial 10/05/2023#Acute PE-CTA chest 7/6 shows small segmental pulmonary embolus in a lingular pulmonary artery. -Continue apixiban 5mg  BID #CKD #Hyperkalemia - resolved-Avoid nephrotoxins as able-Renally dose medications as appropriate-Continue sodium bicarb 650 mg TID-Continue dietary potassium restriction (s/p insulin  and dextrose ; s/p Lokelma ) CAD (s/p PCI in 2019)#HLD-Continue rosuvastatin  40 mg daily-Restart Plavix  and discontinue aspirin  now that patient has been started on full anti-coagulation as per 2020 [facility] Expert Consensus Pathway for Anticoagulant and Antiplatelet Therapy  Comorbidities Comorbidities present on admission:Chronic Heart Failure with preserved ejection fraction, last EF: 65% 05/10/2023. 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)Acute/Chronic AnemiaPMH of COVID  Secondary diagnoses occurring during hospitalization:HyperkalemiaHypocalcemiaHyponatremia Diet: Diet  Renal Non DialysisVTE PPx:  apixaban  (ELIQUIS ) tablet 5 mg  Medication Reconciliation complete: Partially Communication: RN, Consultants, and Case ManagementDischarge Readiness: Expected Discharge Date: 09/21/23 AM-PAC (RN/PT): 13 / 11 Physical Therapy Disposition Recommendation: Moderate complexity (09/16/23)  Expected discharge location: Home with servicesBarrier(s) to discharge: Needs to finish IV abx (expect to discharge after final Zosyn  dose on 7/24) Full CodeI spent 50 minutes today on this encounter before, during and after the visit, reviewing labs and records, evaluating and examining the patient, entering orders and documenting the visit. Signed:Neena Beecham, PA7/21/20254:35 PM

## 2023-09-18 NOTE — Other
 ANTIMICROBIAL SURVEILLANCE NOTECurrent Therapy: Antimicrobial #1:  ciprofloxacinAntimicrobial #2:  piperacillin /tazobactamIndication: Pseudomonas UTIPertinent Cultures:Culture #1:  Urine 7/6 Pseudomonas aeruginosa (pan sensitive)                      Other results:7/6 [place] Abdomen pelvis: Stable moderate-severe hydroureteronephrosis. Thickened appearance of urinary bladder suggestive of chronic cystitis. Lab Results Last 72 Hours Component Value Date/Time  WBC 7.0 09/18/2023 05:19 AM  WBC 6.6 09/17/2023 04:31 PM  WBC 5.6 09/16/2023 05:00 AM AST Suggestions/Comments:Patient is a 60 YOM with PMH of BPH s/p chronic foley who presented to the ED on on 7/6. Urine culture on 7/6 at 0914 was positive for 50-99,000 CFU/mL of Pseudomonas aeruginosa. Of note patient's chronic foley was removed on 7/6 at 0929 indicating that this culture was from the chronic foley and is less reliable of a sample.  No repeat urine samples were collected after foley replacement. Patient was initially treated with tobramycin  160 mg x 1 dose on 7/6 which remained detectable on labs until 7/11. Patient is currently on dual anti-pseudomonal antibiotics, which given the pan sensitive isolate, is overly broad and exposes the patient to resistance or C diff. Would consolidate to single antibiotic option. Reasonable to continue zosyn  while admitted and switch to ciprofloxacin  on discharge to finish therapy.Also, Limiting the duration of antibiotic therapy is important as the risk for CDI increases after 3 days of therapy (see below).Given patient has already received tobramycin  and several days of ciprofloxacin  therapy, would discontinue all antibiotics on 7/22.  See Delores RUDE et al. PLoS ONE 2014; 9(8): [id number] For any questions, please contact:  Pharmacist    Phone/Mobile Heartbeat/Pager:  [facility]

## 2023-09-18 NOTE — Plan of Care
 Plan of Care Overview/ Patient StatusDaughter declined to send him to rehab today. She said family can take him back to home and provide assistance. Pt is on IV antibiotics. He will complete only on Thursday. Plan is to dc him after completion of IV antibiotics.Katie Ned RN,BSNCare Manager

## 2023-09-18 NOTE — Care Coordination-Inpatient
 Follow up call placed to patient Mark [name] to discuss STR services for her father, [name] informed this Clinical research associate that she would rather her father return home she states  My family and I would like to spend as much time as possible with him. This Clinical research associate informed [name] that [employer] has signed on to provide VNA services, [name] states that she is in agreement. Care management will continue to follow along with the patient's medical team as needed.Troy JonesTransition CoordinatorCare Management ([facility]SC)

## 2023-09-18 NOTE — Plan of Care
 Plan of Care Overview/ [facility]scSpiritual Care NoteAssessment:  Religion:ChristianThis Chaplain introduced herself and spiritual care services to Pt.  Then an offer of support was extended.  Pt politely declined.  Intervention:  Referral Source: Chaplain InitiatedResponding Chaplain: On-Call ChaplainLanguage or Special Accommodation Rendered?: NoVisit and Intervention Type: Initial Spiritual care interventions provided: Relational or Interpersonal Interventions: : Introduction to [employer], Spiritual Support/PresenceRespect for Patient Wishes or Other Interventions: Respect for Patient's Decline of All Chaplain Visits Outcome: With the help of the chaplain, patient/loved one(s): Unknown or Declined Outcomes:: Had Declination of Chaplain Visit Respected Plan: Follow-Up Visit Needed: NoSpiritual Care is available 24/7 for support of patients, their loved ones, and staff. Please consult spiritual care in Epic for non-urgent needs or call our on-call chaplain for urgent needs. Below is a list of common reasons to consult us  for support:  N: new diagnosis E: emotional/spiritual distress E: existential distress D: decision making/goals of care meetingsS: support for staff and patients' loved ones   C: compromised copingA: anxiety/stress/grief/loneliness R: religious/cultural/ritual needsE: end-of-life care/death/dying  Rev. Sinclair Carter, M.Div., BCCChaplain II, Oncology, MICU and Spanish speaking referralsTues-Fri [facility]: [phone] On-call: [phone]  09/18/2023 10:07 AM

## 2023-09-18 NOTE — Progress Notes
 Progress NoteAttending Provider: Mbame, Dione E, MDSubjective: No acute concerns. No chest pain, no abdominal pain, no shortness of breathObjective: Vitals:Temp:  [97.3 ?F (36.3 ?C)-98.3 ?F (36.8 ?C)] 97.5 ?F (36.4 ?C)Pulse:  [73-79] 76Resp:  [18-20] 18BP: (120)/(60-66) 120/66SpO2:  [96 %-99 %] 99 %Device (Oxygen Therapy): room air I/O's:Intake/Output Summary (Last 24 hours) at 09/17/2023 1527Last data filed at 09/17/2023 0512Gross per 24 hour Intake -- Output 1325 ml Net -1325 ml  Physical Exam:General: No acute distress, well nourished, appropriate for stated ageHEENT: normocephalic, atraumatic, moist oral mucosa without exudate, no rhinorrhea, EOMICV/Chest: Normal S1/S2, RRR, no m/r/gPulm: CTABAbdomen: Soft, non-distended, non-tender, normal bowel soundsMSK: 2+ pulses radial and DP and PTSkin: No ecchymosis notedNeuro: A&Ox3, No focal deficit notedPsych: Mood normal, behavior normal Labs: CBC/BMP: Recent Labs Lab 07/16/250442 07/17/250510 07/17/251314 07/18/251400 07/19/250500 WBC 7.2 6.5  --  5.6 5.6 HGB 7.8* 7.7*  --  8.4* 8.0* PLT 287 310  --  344 318 NA 135* 136  --  136 137 K 5.1 5.6*   < > 5.3 5.1 CO2 21 20  --  20 21 ANIONGAP 11 13  --  12 12 BUN 41* 45*  --  44* 43* CREATININE 3.61* 3.73*  --  3.48* 3.43*  < > = values in this interval not displayed. Mark [name], LFTs: Recent Labs Lab 07/14/250555 07/15/250441 07/16/250442 07/17/250510 07/17/251314 07/18/251400 07/19/250500 CALCIUM  8.4*   < > 8.3* 8.4*  --  8.4* 8.4* MG 2.0  --   --   --   --   --   --  PHOS 4.5  --   --   --    < >  --  4.6*  < > = values in this interval not displayed. Glucose Trend: Recent Labs Lab 07/17/250920 07/17/251022 07/17/251130 07/17/251244 07/18/251400 07/19/250500 GLU 98 178* 131* 102* 104* 103* Diagnostics:No results found. Assessment & Plan: 89 y.o. male PMHx: BPH, s/p chronic Foley catheter who presented with chest pain and was found to have small PE, UTI with bilateral hydronephrosis and hematuria, and new diagnosis of metastatic prostate cancer in setting of incidental finding of sclerotic lesion of L femur and L ribs and elevated PSA (no biopsy performed).  Metastatic prostate cancer:Bone lesions in pelvis with PSA 140. Oncology discussed the case with the patient and his family and it was decided to defer biopsy and start treatment- f/u [facility] liquid biopsy- continue Casodex  50 mg daily- patient will need to be discharged with a supply of Casodex  from [facility] to [facility] to use at [facility].- Dispo pending Oncologic Orthopedics team and PT evaluation to comment on WB capacity.#Hematuria#Bilateral Hydronephrosis#Pseudomonas UTIHematuria is possibly related to UTI vs. prostate cancer. Found to be significnat on 07/20 preventing discharge- Urology consulted earlier in hospitalization- s/p tobramycin  x 1 which pharmacy reported is sufficient to treat UTI in the setting of reduced renal clearance- s/p Foley cath placement- given profound hematuria, will order CBC in PM- Urology recommended treating Pseudomonas UTI- Plan discussed with pharmacy for 14-day course of cipro  starting 07/19, but was switched to Zosyn  given risk of Cdiff and tendon rupture with cipro  while inpatient. If patient is to be discharged, he needs to go with a PIV to the facility.- outpatient follow-up with Urology for voiding trial Acute on chronic anemia:- s/p 1 unit pRBCs on 7/11- trend H&H- plan to transfuse for Hgb < 7.0 Acute PE:- continue apixaban  5 mg BIDCKD / hyperkalemia:  renal function appears to be fluctuating around baseline- trend RFTs / electrolytes- avoid nephrotoxins as able- renally  dose medications as appropriate- continue sodium bicarb 650 mg TID- continue dietary potassium restriction- s/p insulin  and dextrose - s/p Lokelma  CAD / HLD: - s/p PCI in 2019- continue rosuvastatin  40 mg daily- restart Plavix  and discontinue aspirin  now that patient has been started on full anti-coagulation as per 2020 [facility] Expert Consensus Pathway for Anticoagulant and Antiplatelet Therapy DIET: RenalPPX: eliquisDISPO: pending fu of hematuria and H&HCODE STATUS: Full CodeSigned:[name] MDInternal Medicine PGY-37/20/2025

## 2023-09-18 NOTE — Plan of Care
 Plan of Care Overview/ Patient StatusA&Ox 4 on RA. Hard of hearing.L 18 PIV patent, Zosyn  given. No complaints of pain. Renal diet pills whole. Incontinent of stool. LBM 09/18/23. Foley in place, care provided. Red urine noted, team aware. Assist x 2 in bed and assist x 2 with walker OOB transfers only. DTI R buttock. Incontinence associated dermatitis on buttocks. Last Hgb 7.9 bed alarm active. Safety maintained. Problem: Adult Inpatient Plan of CareGoal: Plan of Care ReviewOutcome: Interventions implemented as appropriateGoal: Patient-Specific Goal (Individualized)Outcome: Interventions implemented as appropriateGoal: Absence of Hospital-Acquired Illness or InjuryOutcome: Interventions implemented as appropriateGoal: Optimal Comfort and WellbeingOutcome: Interventions implemented as appropriateGoal: Readiness for Transition of CareOutcome: Interventions implemented as appropriate Problem: WoundGoal: Optimal CopingOutcome: Interventions implemented as appropriateGoal: Optimal Functional AbilityOutcome: Interventions implemented as appropriateGoal: Absence of Infection Signs and SymptomsOutcome: Interventions implemented as appropriateGoal: Improved Oral IntakeOutcome: Interventions implemented as appropriateGoal: Optimal Pain Control and FunctionOutcome: Interventions implemented as appropriateGoal: Skin Health and IntegrityOutcome: Interventions implemented as appropriateGoal: Optimal Wound HealingOutcome: Interventions implemented as appropriate Problem: Skin Injury Risk IncreasedGoal: Skin Health and IntegrityOutcome: Interventions implemented as appropriate Problem: Fall Injury RiskGoal: Absence of Fall and Fall-Related InjuryOutcome: Interventions implemented as appropriate Problem: Physical Therapy GoalsGoal: Physical Therapy GoalsDescription: PT GOALS1. Patient will perform bed mobility independently2. Patient will sit edge of bed with supervision for at least 5 minutes3. Patient will perform transfers with CGA assist using appropriate assistive device 4. Patient will ambulate a minimum of 100 feet with minimal assist using appropriate assistive device  Outcome: Interventions implemented as appropriate

## 2023-09-18 NOTE — Plan of Care
 Plan of Care Overview/ Patient Status1900-0700Problem: Adult Inpatient Plan of CareGoal: Plan of Care ReviewOutcome: Interventions implemented as appropriate Bedrest. AAOX4. HOH. Device present bilaterally. No sob. VSS.  Folay catheter in place , bloody urine, no clots. Denies pain. Bm 3x, lose stools. Incontinent.  Skin barrier applied  to buttocks. Position off wounds. T&P Q2-3h.  Slept fairly. IV abx per MAR.  Lotion applied to dry ,itchy back w/ relief. Rounds performed. Bed alarm set, audible. Hematauria persists, no clot. Encourage po fluids. IV abx  continued. No acute events. Will monitor.

## 2023-09-19 ENCOUNTER — Ambulatory Visit: Admit: 2023-09-19 | Payer: PRIVATE HEALTH INSURANCE | Attending: Vascular Surgery | Primary: Internal Medicine

## 2023-09-19 ENCOUNTER — Encounter: Admit: 2023-09-19 | Payer: PRIVATE HEALTH INSURANCE | Attending: Medical Oncology | Primary: Internal Medicine

## 2023-09-19 ENCOUNTER — Encounter: Admit: 2023-09-19 | Payer: PRIVATE HEALTH INSURANCE | Primary: Internal Medicine

## 2023-09-19 LAB — CBC WITH AUTO DIFFERENTIAL
BKR WAM ABSOLUTE IMMATURE GRANULOCYTES.: 0.03 x 1000/ÂµL (ref 0.00–0.30)
BKR WAM ABSOLUTE LYMPHOCYTE COUNT.: 1.22 x 1000/ÂµL (ref 0.60–3.70)
BKR WAM ABSOLUTE NRBC: 0 x 1000/ÂµL (ref 0.00–1.00)
BKR WAM ANC (ABSOLUTE NEUTROPHIL COUNT): 3.51 x 1000/ÂµL (ref 2.00–7.60)
BKR WAM BASOPHIL ABSOLUTE COUNT.: 0.08 x 1000/ÂµL (ref 0.00–1.00)
BKR WAM BASOPHILS: 1.3 % (ref 0.0–1.4)
BKR WAM EOSINOPHIL ABSOLUTE COUNT.: 0.8 x 1000/ÂµL (ref 0.00–1.00)
BKR WAM EOSINOPHILS: 12.9 % — ABNORMAL HIGH (ref 0.0–5.0)
BKR WAM HEMATOCRIT: 28.5 % — ABNORMAL LOW (ref 38.50–50.00)
BKR WAM HEMOGLOBIN: 8.7 g/dL — ABNORMAL LOW (ref 13.2–17.1)
BKR WAM IMMATURE GRANULOCYTES: 0.5 % (ref 0.0–1.0)
BKR WAM LYMPHOCYTES: 19.7 % (ref 17.0–50.0)
BKR WAM MCH: 23.6 pg — ABNORMAL LOW (ref 27.0–33.0)
BKR WAM MCHC: 30.5 g/dL — ABNORMAL LOW (ref 31.0–36.0)
BKR WAM MCV: 77.2 fL — ABNORMAL LOW (ref 80.0–100.0)
BKR WAM MONOCYTE ABSOLUTE COUNT.: 0.56 x 1000/ÂµL (ref 0.00–1.00)
BKR WAM MONOCYTES: 9 % (ref 4.0–12.0)
BKR WAM MPV: 9.2 fL (ref 8.0–12.0)
BKR WAM NEUTROPHILS: 56.6 % (ref 39.0–72.0)
BKR WAM NUCLEATED RED BLOOD CELLS: 0 % (ref 0.0–1.0)
BKR WAM PLATELETS: 343 x1000/ÂµL (ref 150–420)
BKR WAM RDW-CV: 17.2 % — ABNORMAL HIGH (ref 11.0–15.0)
BKR WAM RED BLOOD CELL COUNT.: 3.69 M/ÂµL — ABNORMAL LOW (ref 4.00–6.00)
BKR WAM WHITE BLOOD CELL COUNT: 6.2 x1000/ÂµL (ref 4.0–11.0)

## 2023-09-19 LAB — BASIC METABOLIC PANEL
BKR ANION GAP: 12 (ref 7–17)
BKR BLOOD UREA NITROGEN: 40 mg/dL — ABNORMAL HIGH (ref 8–23)
BKR BUN / CREAT RATIO: 12.3 (ref 8.0–23.0)
BKR CALCIUM: 8.3 mg/dL — ABNORMAL LOW (ref 8.8–10.2)
BKR CHLORIDE: 106 mmol/L (ref 98–107)
BKR CO2: 20 mmol/L (ref 20–30)
BKR CREATININE DELTA: -0.07
BKR CREATININE: 3.26 mg/dL — ABNORMAL HIGH (ref 0.40–1.30)
BKR EGFR, CREATININE (CKD-EPI 2021): 17 mL/min/1.73m2 — ABNORMAL LOW (ref >=60)
BKR GLUCOSE: 103 mg/dL — ABNORMAL HIGH (ref 70–100)
BKR POTASSIUM: 4.8 mmol/L (ref 3.3–5.3)
BKR SODIUM: 138 mmol/L (ref 136–144)

## 2023-09-19 MED ORDER — LANOLIN-MINERAL OIL LOTION
Freq: Two times a day (BID) | TOPICAL | Status: DC
Start: 2023-09-19 — End: 2023-09-21
  Administered 2023-09-20 – 2023-09-21 (×3): 236.000 mL via TOPICAL

## 2023-09-19 NOTE — Progress Notes
 [facility] Progress NoteHospital Medicine ServiceAttending Provider: Maude EMERSON Macintosh, MD  Location:  8830/8830-CHospital Day #14Subjective   Patient seen and examined on 09/19/2023.CC: Syncope anginosa (HC Code)Interim History: Patient seen and examined at the bedside. Hemodynamically stable. No acute events overnight. No new complaints.  Objective  Vitals:Temp:  [97.8 ?F (36.6 ?C)-98.3 ?F (36.8 ?C)] 97.8 ?F (36.6 ?C)Pulse:  [73-80] 80Resp:  [18-20] 20BP: (105-120)/(35-64) 112/35SpO2:  [94 %-98 %] 98 %Device (Oxygen Therapy): room airI/O's:Intake/Output Summary (Last 24 hours) at 09/19/2023 0958Last data filed at 09/19/2023 0500Gross per 24 hour Intake -- Output 1500 ml Net -1500 ml  Physical Exam General: Well-appearing, in no acute distressNeuro: AO x 3Head: Normocephalic and atraumaticEyes: PERRLA, intact EOMs, sclera anicteric/without injectionOropharynx: Mucous membranes are moistCardiovascular: RRR Lungs: Normal breath sounds, CTA across lung fieldsAbdomen: Non-distended, non-tender, soft, normoactive bowel soundsExtremities: No cyanosis or edemaGU: Foley catheter draining red opaque urine without clots Labs:I have reviewed the patient's pertinent labs as resulted in the EMR.CBC Last 24hrs: WBC/Hgb/Hct/Plts:  6.2/8.7/28.50/343 (07/22 0514)Lytes Last 24hrs: Na/K/Cl/CO2:  138/4.8/106/20 (07/22 0514)Chem Last 24hrs: BUN/Cr/GLU/ALT/AST/AMYLASE/LIPASE:  40/3.26/103/--/--/--/-- (07/22 0514)Coags Last 24hrs:  Troponin No results for input(s): TROPTHS in the last 72 hours.Peripheral Access:Periph IV 09/03/23 cephalic (thumb side), left over-the-needle catheter system 18 gauge Paramedic (Active)  Foley Catheter:UreTHral Catheter 09/08/23 22 Physician (Active) Foley order placed.Imaging:No results found. I reviewed test results in formulating this patient's plan of care.   Assessment: 89 y.o. male PMHx significant for BPH, s/p chronic Foley catheter who presented with chest pain and was found to have small PE, UTI with bilateral hydronephrosis and hematuria, and new diagnosis of metastatic prostate cancer in setting of incidental finding of sclerotic lesion of L femur and L ribs and elevated PSA (no biopsy performed).   Plan: #Concern for prostate cancer with likely bony metastasis-Consultants: Oncology; orthopedics; urology -PSA 140 -[place] Abdomen and pelvis performed 7/6 and revealed bone lesions: ill-defined sclerotic lesion within the intertrochanteric region of the left femur and subtle sclerotic lesions of left-sided ribs-Ortho recommends prostate biopsy and L femur needle biopsy as well as outpatient follow-up with ortho-oncology team; activity: WBAT with cane or walker -Urology recommends biopsy of metastatic lesions (by Interventional Radiology versus [facility]) as this would establish metastatic disease in 1 procedure rathar than needing two procedures (prostate biopsy and biopsy of malignant lesions) -Patient to defer biopsy at this time and started Casodex  50mg  daily per oncology team-Follow up [facility] liquid biopsy-Patient was initially set to discharge to STR by patient's daughter opting for home with services to spend more time with her father -To follow up with outpatient orthopedic oncology 7/29-Outpatient f/u with Dr. Vermell, scheduled for 10/05/23. Will need to receive leuprolide injection at that time. #Hematuria#Bilateral hydronephrosis#Pseudomonas UTI-Consultants: Urology-S/p Foley cath placement -Hematuria likely 2/2 UTI versus prostate cancer -Patient did receive 1 unit of pRBCs this hospital stay (7/11 for Hb of 6.9). Hb 8.7 this morning.which is stable-Patient status post 1x Tobramycin  dose. To receive 7 more days of abx - remains on Zosyn  for which he has 3 more days to complete before discharge. Initially was able to discharge on Zosyn  to STR but patient's family now opting for return home. Patient's UTI is suceptible to Cipro  however risks of achilles tendon rupture and c diff colitis will favor Zosyn  -Continue to follow up H/H-Will follow up with Urology as an outpatient for voiding trial 10/05/2023#Acute PE-CTA chest 7/6 shows small segmental pulmonary embolus in a lingular pulmonary artery-Continue apixiban 5mg  BID #CKD #Hyperkalemia - resolved-Avoid nephrotoxins  as able-Renally dose medications as appropriate-Continue sodium bicarb 650 mg TID-Continue dietary potassium restriction (s/p insulin  and dextrose ; s/p Lokelma ) CAD (s/p PCI in 2019)#HLD-Continue rosuvastatin  40 mg daily-Restart Plavix  and discontinue aspirin  now that patient has been started on full anti-coagulation as per 2020 [facility] Expert Consensus Pathway for Anticoagulant and Antiplatelet Therapy  Comorbidities Comorbidities present on admission:Chronic Heart Failure with preserved ejection fraction, last EF: 65% 05/10/2023. 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)Acute/Chronic AnemiaPMH of COVID  Secondary diagnoses occurring during hospitalization:HyperkalemiaHypocalcemiaHyponatremia Diet: Diet Renal Non DialysisVTE PPx:  apixaban  (ELIQUIS ) tablet 5 mg  Medication Reconciliation complete: Partially Communication: RN, Consultants, and Case ManagementDischarge Readiness: Expected Discharge Date: 09/21/23 AM-PAC (RN/PT): 13 / 11 Physical Therapy Disposition Recommendation: Moderate complexity (09/16/23)  Expected discharge location: Home with servicesBarrier(s) to discharge: Needs to finish IV abx (expect to discharge after final Zosyn  dose on 7/24) Full CodeI spent 35 minutes today on this encounter before, during and after the visit, reviewing labs and records, evaluating and examining the patient, entering orders and documenting the visit. Signed:Kendrell Lottman, PA7/22/20259:58 AM

## 2023-09-19 NOTE — Plan of Care
 Problem: Adult Inpatient Plan of CareGoal: Plan of Care ReviewOutcome: Interventions implemented as appropriateGoal: Patient-Specific Goal (Individualized)Outcome: Interventions implemented as appropriateGoal: Absence of Hospital-Acquired Illness or InjuryOutcome: Interventions implemented as appropriateGoal: Optimal Comfort and WellbeingOutcome: Interventions implemented as appropriateGoal: Readiness for Transition of CareOutcome: Interventions implemented as appropriate Plan of Care Overview/ Patient Status0700-1900Pt is A&Ox4. On RA, no telemetry. Had no c/o pain. Had c/o nausea this afternoon, PRN zofran  adm w/ moderate effect. HOH. Renal non-HD diet, takes pills whole. Barrier cream applied to coccyx & excoriation to groin. Continent of bowels, last BM was one day ago. Foley in place & draining bloody urine. Assist x1 in bed. Assist x1 OOB w/ RW, BA on. 22g to LUE is patent & flushed w/o difficulties. Safety & comfort measures maintained, call light within reach.

## 2023-09-19 NOTE — Plan of Care
 Inpatient Physical Therapy Progress Note IP Adult PT Eval/Treat - 09/19/23 1530    Date of Visit / Treatment  Date of Visit / Treatment 09/19/23   Note Type Progress Note   Progress Report Due 09/20/23   Start Time 1450   End Time 1530   Total Treatment Time 30    General Information  Subjective pt agreeable   General Observations pt in bed, session cleared with RN prior, RA, NAD   Precautions/Limitations Fall Precautions;Bed alarm;Chair alarm   Precautions/Limitations Comment per ortho: WBAT with cane or walker    Weight Bearing Status  Weight Bearing Status Comments per ortho: WBAT with cane or walker    Vital Signs and Orthostatic Vital Signs  Vital Signs Vital Signs Stable   Vital Signs Free text per chart review    Pain/Comfort  Pain Comment (Pre/Post Treatment Pain) no co pain throughout    Cognition  Following Commands Follows one step commands without difficulty    Vision/ Hearing  Hearing difficulties / Use of hearing aids [facility]    Musculoskeletal  LUE Muscle Strength Grading 3-->active movement against gravity   RUE Muscle Strength Grading 3-->active movement against gravity   LLE Muscle Strength Grading 3-->active movement against gravity   RLE Muscle Strength Grading 3-->active movement against gravity    Muscle Tone  Muscle Tone Testing Results No muscle tone deficits noted    Sensory Assessment  Sensory Tests Results No sensory impairment noted    Skin Assessment  Skin Assessment See Nursing Documentation    Balance  Sitting Balance: Static  FAIR+     Maintains static position without assist or device, may require Supervision or Verbal Cues (>2 minutes)   Sitting Balance: Dynamic  FAIR      Performs dynamic activities through 75% range Contact Guard or partial range (50-75%) with Supervision   Standing Balance: Static POOR-     Maximal assist to maintain static position with no Assistive Device Standing Balance: Dynamic  POOR-    Unable to move from midline voluntarily, maximal assist to right self   Balance Assist Device Rolling walker   Balance Skills Training Comment unable to come to complete stand despite max Ax1    Bed Mobility  Supine-to-Sit Independence/Assistance Level Moderate assist   Supine-to-Sit Assist Device Hand held assist;Bed rails;Head of bed elevated   Sit-to-Supine Independence/Assistance Level Maximum assist   Sit-to-Supine Assist Device Hand held assist   Bed Mobility Comments max Ax2 to reposition in bed    Sit-Stand Transfer Training  Sit-to-Stand Transfer Independence/Assistance Level Maximum assist   Sit-to-Stand Transfer Assist Device Rolling walker   Stand-to-Sit Transfer Independence/Assistance Level Maximum assist   Stand-to-Sit Transfer Assist Device Rolling walker   Sit-Stand Transfer Comments pt unable to come to complete stand despite max Ax1 + RW and elivated bed. Safely returned to sitting    Handoff Documentation  Handoff Patient in bed;Bed alarm;Patient instructed to call nursing for mobility;Discussed with nursing   Handoff Comments time spent communicating with provider and RN post on pt status    Activity Tolerance  Activity Tolerance Comments fair-    PT- AM-PAC - Basic Mobility Screen- How much help from another person do you currently need.....  Turning from your back to your side while in a a flat bed without using rails? 3 - A Little - Requires a little help (supervision, minimal assistance). Can use assistive devices.   Moving from lying on your back to sitting on the side of a flat bed  without using bed rails? 2 - A Lot - Requires a lot of help (maximum to moderate assistance). Can use assistive devices.   Moving to and from a bed to a chair (including a wheelchair)? 1 - Total - Requires total assistance or cannot do it at all.   Standing up from a chair using your arms(e.g., wheelchair or bedside chair)? 1 - Total - Requires total assistance or cannot do it at all.   To walk in a hospital room? 1 - Total - Requires total assistance or cannot do it at all.   Climbing 3-5 steps with a railing? 1 - Total - Requires total assistance or cannot do it at all.   AMPAC Mobility Score 9   TARGET Highest Level of Mobility Mobility Level 3, Sit on edge of bed   ACTUAL Highest Level of Mobility Mobility Level 3, Sit on edge of bed    Therapeutic Exercise  Therapeutic Exercise Comments AAROM B UE and LE to tolerance    Clinical Impression  Follow up Assessment Pt tolerated treatment fairly well with good effort throughout despite weakness.  Pt able to perform bed mobility with mod-max Ax1, STS with max Ax1 + RW but unable to complete a stand at this time. Pt recommendations are for moderate complexity when medically cleared with continued PT while in the hospital setting to maximize functional mobility.    Patient/Family Stated Goals  Patient/Family Stated Goal(s) return home;get stronger    Frequency/Equipment Recommendations  PT Frequency 3x per week   Next Treatment Expected 09/21/23    PT Recommendations for Inpatient Admission  Activity/Level of Assist assist of 2;mechanical lift    Planned Treatment / Interventions  Plan for Next Visit progress as tolerated    PT Discharge Summary  Physical Therapy Disposition Recommendation Moderate complexity support and therapy to progress functional mobility/ ADLs/ IADLs recommended for post- acute care.  See assessment for additional details.   Additional Therapy Recommendations Physical Therapy Services in Discharge Environment   Equipment Recommendations for Discharge --   if returning home requires hoyer lift, hospital bed, wc, stretcher transport

## 2023-09-19 NOTE — Plan of Care
 Plan of Care Overview/ Patient Status1900-0700Pt is AOx4, HOH, RA, VSS, pt states no pain, blanchable on coccyx OTA w/ barrier cream, 18 IV on LUE, renal diet, take pills whole, foley in use, bedfast, independent T/R, incontinence of stool, last BM 07/21, bed in lowest position, pt was instructed to use call bell for any needs

## 2023-09-20 LAB — BASIC METABOLIC PANEL
BKR ANION GAP: 12 (ref 7–17)
BKR BLOOD UREA NITROGEN: 40 mg/dL — ABNORMAL HIGH (ref 8–23)
BKR BUN / CREAT RATIO: 11.6 (ref 8.0–23.0)
BKR CALCIUM: 8.6 mg/dL — ABNORMAL LOW (ref 8.8–10.2)
BKR CHLORIDE: 105 mmol/L (ref 98–107)
BKR CO2: 20 mmol/L (ref 20–30)
BKR CREATININE DELTA: 0.19
BKR CREATININE: 3.45 mg/dL — ABNORMAL HIGH (ref 0.40–1.30)
BKR EGFR, CREATININE (CKD-EPI 2021): 16 mL/min/1.73m2 — ABNORMAL LOW (ref >=60)
BKR GLUCOSE: 96 mg/dL (ref 70–100)
BKR POTASSIUM: 4.7 mmol/L (ref 3.3–5.3)
BKR SODIUM: 137 mmol/L (ref 136–144)

## 2023-09-20 LAB — CBC WITH AUTO DIFFERENTIAL
BKR WAM ABSOLUTE IMMATURE GRANULOCYTES.: 0.05 x 1000/ÂµL (ref 0.00–0.30)
BKR WAM ABSOLUTE LYMPHOCYTE COUNT.: 1.48 x 1000/ÂµL (ref 0.60–3.70)
BKR WAM ABSOLUTE NRBC: 0 x 1000/ÂµL (ref 0.00–1.00)
BKR WAM ANC (ABSOLUTE NEUTROPHIL COUNT): 3.55 x 1000/ÂµL (ref 2.00–7.60)
BKR WAM BASOPHIL ABSOLUTE COUNT.: 0.06 x 1000/ÂµL (ref 0.00–1.00)
BKR WAM BASOPHILS: 0.9 % (ref 0.0–1.4)
BKR WAM EOSINOPHIL ABSOLUTE COUNT.: 0.74 x 1000/ÂµL (ref 0.00–1.00)
BKR WAM EOSINOPHILS: 11.4 % — ABNORMAL HIGH (ref 0.0–5.0)
BKR WAM HEMATOCRIT: 26.3 % — ABNORMAL LOW (ref 38.50–50.00)
BKR WAM HEMOGLOBIN: 8.1 g/dL — ABNORMAL LOW (ref 13.2–17.1)
BKR WAM IMMATURE GRANULOCYTES: 0.8 % (ref 0.0–1.0)
BKR WAM LYMPHOCYTES: 22.8 % (ref 17.0–50.0)
BKR WAM MCH: 23.7 pg — ABNORMAL LOW (ref 27.0–33.0)
BKR WAM MCHC: 30.8 g/dL — ABNORMAL LOW (ref 31.0–36.0)
BKR WAM MCV: 76.9 fL — ABNORMAL LOW (ref 80.0–100.0)
BKR WAM MONOCYTE ABSOLUTE COUNT.: 0.61 x 1000/ÂµL (ref 0.00–1.00)
BKR WAM MONOCYTES: 9.4 % (ref 4.0–12.0)
BKR WAM MPV: 9.3 fL (ref 8.0–12.0)
BKR WAM NEUTROPHILS: 54.7 % (ref 39.0–72.0)
BKR WAM NUCLEATED RED BLOOD CELLS: 0 % (ref 0.0–1.0)
BKR WAM PLATELETS: 330 x1000/ÂµL (ref 150–420)
BKR WAM RDW-CV: 17.1 % — ABNORMAL HIGH (ref 11.0–15.0)
BKR WAM RED BLOOD CELL COUNT.: 3.42 M/ÂµL — ABNORMAL LOW (ref 4.00–6.00)
BKR WAM WHITE BLOOD CELL COUNT: 6.5 x1000/ÂµL (ref 4.0–11.0)

## 2023-09-20 MED ORDER — HOYER LIFT
TOPICAL | Status: AC
Start: 2023-09-20 — End: ?

## 2023-09-20 NOTE — Plan of Care
 Plan of Care Overview/ Patient Status0700-1930Patient A/O x4, RA, VSS, patient hard hearing, Hearing aid in place. Foley catheter patent draining red( Hematuria) noted drainage urine. Last BM 09/20/2023 patient able to call for bed pan. Patient tolerated all meds/fluid/meals well. Assist x1 bed mobility, and Ax2 OOB with RW. Patient ambulate about 64' today with PT. Safety measures maintained, will continue to follow plan of care.

## 2023-09-20 NOTE — Plan of Care
 Plan of Care Overview/ Patient Status1900-0700Problem: Adult Inpatient Plan of CareGoal: Plan of Care ReviewOutcome: Interventions implemented as appropriate  Resting in bed. VS wnl. No sob. HOH, hearing aids in place bilaterally. Triple lumen foley catheter in place w/ bloody UO.Few clots noted. Denies pain.  Ax1 T&P in bed. Repositioned off wound. Pillows in use. Skin barrier applied to buttocks. IV abx continued. Rounds performed. Call light w/in reach. Bed alarm set,audible. IV abx continued. Painless Hematuria. No acute events. Will monitor labs.

## 2023-09-20 NOTE — Progress Notes
 [facility] Progress NoteHospital Medicine ServiceAttending Provider: Maude EMERSON Macintosh, MD  Location:  8830/8830-CHospital Day #15Subjective   Patient seen and examined on 09/20/2023.CC: Syncope anginosa (HC Code)Interim History: Patient seen and examined at the bedside. Hemodynamically stable. No acute events overnight. No new complaints. He is worried his daughter does not want him to come home because he might be too much for her to take care of him.  Objective  Vitals:Temp:  [97.5 ?F (36.4 ?C)-98.1 ?F (36.7 ?C)] 98.1 ?F (36.7 ?C)Pulse:  [51-70] 66Resp:  [18-20] 19BP: (95-119)/(57-64) 95/61SpO2:  [94 %-98 %] 94 %Device (Oxygen Therapy): room airI/O's:Intake/Output Summary (Last 24 hours) at 09/20/2023 0853Last data filed at 09/20/2023 0141Gross per 24 hour Intake 689.14 ml Output 750 ml Net -60.86 ml  Physical Exam General: In no acute distressNeuro: AO x 3Head: Normocephalic and atraumaticEyes: PERRLA, intact EOMs, sclera anicteric/without injectionOropharynx: Mucous membranes are moistCardiovascular: RRR Lungs: Normal breath sounds, CTA across lung fieldsAbdomen: Non-distended, non-tender, soft, normoactive bowel soundsExtremities: No cyanosis or edemaGU: Foley catheter draining red transparent urine no clots Labs:I have reviewed the patient's pertinent labs as resulted in the EMR.CBC Last 24hrs: WBC/Hgb/Hct/Plts:  6.5/8.1/26.30/330 (07/23 0520)Lytes Last 24hrs: Na/K/Cl/CO2:  137/4.7/105/20 (07/23 0520)Chem Last 24hrs: BUN/Cr/GLU/ALT/AST/AMYLASE/LIPASE:  40/3.45/96/--/--/--/-- (07/23 0520)Coags Last 24hrs:  Troponin No results for input(s): TROPTHS in the last 72 hours.Peripheral Access:Periph IV 09/19/23 1535 median cubital(antecubital fossa), left 22 gauge (Active)  Foley Catheter:UreTHral Catheter 09/08/23 22 Physician (Active) Foley order placed.Imaging:No results found. I reviewed test results in formulating this patient's plan of care.   Assessment: 89 y.o. male PMHx significant for BPH, s/p chronic Foley catheter who presented with chest pain and was found to have small PE, UTI with bilateral hydronephrosis and hematuria, and new diagnosis of metastatic prostate cancer in setting of incidental finding of sclerotic lesion of L femur and L ribs and elevated PSA (no biopsy performed).   Plan: #Concern for prostate cancer with likely bony metastasis-Consultants: Oncology; orthopedics; urology -PSA 140 -[place] Abdomen and pelvis performed 7/6 and revealed bone lesions: ill-defined sclerotic lesion within the intertrochanteric region of the left femur and subtle sclerotic lesions of left-sided ribs-Ortho recommends prostate biopsy and L femur needle biopsy as well as outpatient follow-up with ortho-oncology team; activity: WBAT with cane or walker -Urology recommends biopsy of metastatic lesions (by Interventional Radiology versus [facility]) as this would establish metastatic disease in 1 procedure rathar than needing two procedures (prostate biopsy and biopsy of malignant lesions) -Patient to defer biopsy at this time and started Casodex  50mg  daily per oncology team-Follow up [facility] liquid biopsy-To follow up with outpatient orthopedic oncology 7/29-Outpatient f/u with Dr. Vermell, scheduled for 10/05/23. Will need to receive leuprolide injection at that time-Patient was initially set to discharge to STR by patient's daughter opting for home with services to spend more time with her father:-Patient's family is opting for return home with services for patient however based on PT eval 7/22 patient's physical status remains moderate complexity with strong recommendation for STR. They now recommend if returning home, requires hoyer lift, hospital bed, wc, stretcher transport#Hematuria#Bilateral hydronephrosis#Pseudomonas UTI-Consultants: Urology-S/p Foley cath placement -Hematuria likely 2/2 UTI versus prostate cancer -Patient did receive 1 unit of pRBCs this hospital stay (7/11 for Hb of 6.9). Hb 8.1 this morning.which is stable-Patient status post 1x Tobramycin  dose. To receive 7 more days of abx - remains on Zosyn  for which he has 2 more days to complete before discharge (including today). Initially was able to discharge on Zosyn  to STR but  patient's family now opting for return home. Patient's UTI is suceptible to Cipro  however risks of achilles tendon rupture and c diff colitis will favor Zosyn  -Continue to follow up H/H-Will follow up with Urology as an outpatient for voiding trial 10/05/2023#Acute PE-CTA chest 7/6 shows small segmental pulmonary embolus in a lingular pulmonary artery-Continue apixiban 5mg  BID #CKD #Hyperkalemia - resolved-Avoid nephrotoxins as able-Renally dose medications as appropriate-Continue sodium bicarb 650 mg TID-Continue dietary potassium restriction (s/p insulin  and dextrose ; s/p Lokelma ) CAD (s/p PCI in 2019)#HLD-Continue rosuvastatin  40 mg daily-Restart Plavix  and discontinue aspirin  now that patient has been started on full anti-coagulation as per 2020 [facility] Expert Consensus Pathway for Anticoagulant and Antiplatelet Therapy  Comorbidities Comorbidities present on admission:Chronic Heart Failure with preserved ejection fraction, last EF: 65% 05/10/2023. 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)Acute/Chronic AnemiaPMH of COVID  Secondary diagnoses occurring during hospitalization:HyperkalemiaHypocalcemiaHyponatremia Diet: Diet Renal Non DialysisVTE PPx:  apixaban  (ELIQUIS ) tablet 5 mg  Medication Reconciliation complete: Partially Communication: RN, Consultants, and Case ManagementDischarge Readiness: Expected Discharge Date: 09/21/23 AM-PAC (RN/PT): 13 / 9 Physical Therapy Disposition Recommendation: Moderate complexity (09/19/23)  Expected discharge location: Home with servicesBarrier(s) to discharge: Needs to finish IV abx (expect to discharge after final Zosyn  dose on 7/24) Full CodeI spent 35 minutes today on this encounter before, during and after the visit, reviewing labs and records, evaluating and examining the patient, entering orders and documenting the visit. Signed:Jaskirat Zertuche, PA7/23/20258:53 AM

## 2023-09-20 NOTE — Plan of Care
 Plan of Care Overview/ Patient StatusHere is the criteria required to process the order for [facility] 1. Rx: Patient Lift with sling Dx: __, LON: 99 *Please specify sling type if it's not a standard sling) 2. Add to the most recent chart note: Patient requires transfer between a bed and chair OR wheelchair/commode and without the use of a lift the patient would be bed-bound.  Spoke with daughter, anticipating dc tomorrow after completion of antibiotics.Daughter said they have WC and hospital bed available at home. Per [employer], they will deliver hoyer lift to home tomorrow. Katie Ned RN,BSNCare Manager

## 2023-09-20 NOTE — Telephone Encounter
 Copied from CRM #[id number]. Topic: Scheduling - Schedule Appointment>> Sep 20, 2023  4:53 PM Pluma Diniz R wrote:Placing outbound call to Hinners, Jaidyn to schedule an appointment.LVM. Patient needs: Next available MD or APP Visit for hematuria follow up and nursing visit for foley exchange within 3 weeks. In Person Visit / Must be Face to Face.

## 2023-09-20 NOTE — Plan of Care
Patient requires transfer between a bed and chair OR wheelchair/commode and without the use of a lift the patient would be bed-bound.

## 2023-09-20 NOTE — Plan of Care
 Inpatient Physical Therapy Goals Re-Assessment IP Adult PT Eval/Treat - 09/20/23 1301    Date of Visit / Treatment  Date of Visit / Treatment 09/20/23   Note Type Goal Re-Assessment   Progress Report Due 10/20/23   Start Time 1231   End Time 1301   Total Treatment Time 30    General Information  Pertinent History Of Current Problem Per chart - 89 y.o. male PMHx significant for BPH, s/p chronic Foley catheter who presented with chest pain and was found to have small PE, UTI with bilateral hydronephrosis and hematuria, and new diagnosis of metastatic prostate cancer in setting of incidental finding of sclerotic lesion of L femur and L ribs and elevated PSA (no biopsy performed).   Subjective I want to walk   General Observations Pt received on 8-8 in bed with HOB elevated, + PIV, + foley, RA, NAD, and cleared by nurse for session.   Precautions/Limitations Fall Precautions;Bed alarm;Chair alarm   Precautions/Limitations Comment per ortho: WBAT with cane or walker    Prior Level of Functioning/Social History  Prior Level of Function independent with assistive device;independent with ADLs   Patient resides with: Adult Child(ren)   Type of Home Condo   Home Setup --   Chair lifts per patient  Equipment Utilized Prior to Admission/Treatment Rolling walker;Rollator;Cane;Wheelchair   Additional Comments HHA that assist with dressing and bathing    Vital Signs and Orthostatic Vital Signs  Vital Signs Free text During gait activity, pt demonstrates decrease verbalization, inconsistent command following and requried assistance to sit in chair and demonstrates decrease responsiveness initally but after 30 seconds sitting in chair, pt demonstrates improved responsiveness, No LOC, and following commands appropriately. Pt does not recall episode. RN present when pt sitting in chair. Covering provider made aware.    Pain/Comfort  Pain Comment (Pre/Post Treatment Pain) No complaint of pain.    Patient Coping  Observed Emotional State accepting;cooperative   Verbalized Emotional State acceptance    Cognition  Orientation Level Oriented to person;Oriented to place   Level of Consciousness alert   Following Commands Follows one step commands with increased time;Follows one step commands with repetition   Personal Safety / Judgment Fall risk;Requires supervision with mobility;Decreased recognition / insight of own deficits;Decreased awareness of need for safety   Attention Requires redirection;Requires cues    Vision/ Hearing  Vision Assessment Results Glasses for reading   Hearing difficulties / Use of hearing aids HOH    Range of Motion  Range of Motion Examination bilateral upper extremity ROM was WFL;bilateral lower extremity ROM was [facility]    Musculoskeletal  LUE Muscle Strength Grading 3-->active movement against gravity   RUE Muscle Strength Grading 3-->active movement against gravity   LLE Muscle Strength Grading 3-->active movement against gravity   RLE Muscle Strength Grading 3-->active movement against gravity    Muscle Tone  Muscle Tone Testing Results No muscle tone deficits noted    Coordination  Coordination Comments Gross coordination slowed    Sensory Assessment  Sensory Assessment Comments No reports of numbness or tingles.    Skin Assessment  Skin Assessment See Nursing Documentation    Posture, Head/Trunk Alignment  Posture, Head/Trunk Alignment Forward head;Rounded shoulders    Balance  Sitting Balance: Static  FAIR+     Maintains static position without assist or device, may require Supervision or Verbal Cues (>2 minutes)   Sitting Balance: Dynamic  FAIR      Performs dynamic activities through 75% range Contact Guard or partial  range (50-75%) with Supervision   Standing Balance: Static FAIR-      Contact Guard to maintain static position with no Assistive Device Standing Balance: Dynamic  POOR+   Moves through 1/2 range with minimal assist to right self   Balance Assist Device Rolling walker   Balance Skills Training Comment Ax1    Bed Mobility  Supine-to-Sit Independence/Assistance Level Minimum assist;Assist of 1   Supine-to-Sit Assist Device Bed rails;Hand held assist;Head of bed elevated   Sit-to-Supine Independence/Assistance Level Minimum assist;Assist of 1   Sit-to-Supine Assist Device Hand held assist   Bed Mobility Comments Pt required HHA to pull trunk up from supine to sit transfer. Pt required assistance with management of B LE to complete sit to supine transfer.    Sit-Stand Transfer Training  Sit-to-Stand Transfer Independence/Assistance Level Minimum assist;Assist of 1   Sit-to-Stand Transfer Assist Device Rolling walker   Stand-to-Sit Transfer Independence/Assistance Level Moderate assist;Assist of 1   Stand-to-Sit Transfer Assist Device Rolling walker   Sit-Stand Transfer Comments Pt completed 2x sit<>stand transfer. Pt required assistance    Gait Training  Independence/Assistance Level  Moderate assist;Maximum assist;Assist of 1   Assistive Device  Rolling walker   Gait Distance 5 feet;x2   Gait Pattern Analysis step through gait   Gait Training Comments Pt demonstrates flexed posture, decrease cadence, and decrease step length. Pt required assistance to maintian balance and required assistance with management of RW and for close proximity into RW. Pt required sitting rest break bewteen traisl due to decrease responsiveness episode but no LOC. RN present at end of session and covering provider made aware.    Handoff Documentation  Handoff Patient in bed;Bed alarm;Patient instructed to call nursing for mobility;Discussed with nursing    Activity Tolerance  Activity Tolerance Comments Fair-    PT- AM-PAC - Basic Mobility Screen- How much help from another person do you currently need..... Turning from your back to your side while in a a flat bed without using rails? 3 - A Little - Requires a little help (supervision, minimal assistance). Can use assistive devices.   Moving from lying on your back to sitting on the side of a flat bed without using bed rails? 3 - A Little - Requires a little help (supervision, minimal assistance). Can use assistive devices.   Moving to and from a bed to a chair (including a wheelchair)? 2 - A Lot - Requires a lot of help (maximum to moderate assistance). Can use assistive devices.   Standing up from a chair using your arms(e.g., wheelchair or bedside chair)? 2 - A Lot - Requires a lot of help (maximum to moderate assistance). Can use assistive devices.   To walk in a hospital room? 1 - Total - Requires total assistance or cannot do it at all.   Climbing 3-5 steps with a railing? 1 - Total - Requires total assistance or cannot do it at all.   AMPAC Mobility Score 12   TARGET Highest Level of Mobility Mobility Level 4, Transfer to chair   ACTUAL Highest Level of Mobility Mobility Level 6, Walk 10+ steps    Therapeutic Exercise  Therapeutic Exercise Comments Pt completed AROM of B UE and B LE at EOB to challenge strength and ROM    Clinical Impression  Follow up Assessment Pt ambulated 63ft x2 with RW at Mod-Max Ax1 level. Pt demonstrates muscle weakness, decrease endurance, and impaired balance leading to difficulties with functional mobility tasks. Pt will benefit from continued skilled PT  to adddress current impairments and activity limtiations. At this time, pt requires Ax1 with bed mobility and functional transfers only with RW to maximize safety. Pt requires w/c for all OOB mobilization.    Patient/Family Stated Goals  Patient/Family Stated Goal(s) feel better;get stronger    Frequency/Equipment Recommendations  PT Frequency 3x per week   Next Treatment Expected 09/21/23   PT/PTA completing this assessment Demeka Sutter    PT Recommendations for Inpatient Admission  Activity/Level of Assist transfers only;assist of 2;with rolling walker   Positioning chair position of the bed;elevate heels;elevate lower extremities;reposition frequently   Therapeutic Exercise ROM as tolerated;encourage exercise program issued    PT Discharge Summary  Physical Therapy Disposition Recommendation Moderate complexity support and therapy to progress functional mobility/ ADLs/ IADLs recommended for post- acute care.  See assessment for additional details.   Additional Therapy Recommendations Physical Therapy Services in Discharge Environment;Occupational Therapy Services in Discharge Environment   Equipment Recommendations for Discharge DARCIA FINDER - The patient will use a Rolling Walker in the home and outside daily to provide greater stability and safer ambulation for participation in ADLs;Wheelchair    Problem: Physical Therapy GoalsGoal: Physical Therapy GoalsDescription: PT GOALS (Updated on 07/23)1. Patient will perform bed mobility independently Not MET2. Patient will sit edge of bed with supervision for at least 5 minutes Not MET3. Patient will perform transfers with CGA assist using appropriate assistive device Not MET4. Patient will ambulate a minimum of 100 feet with minimal assist using appropriate assistive device Not MET Outcome: Interventions implemented as appropriate Avryl Roehm, PT

## 2023-09-20 NOTE — Other
 Patient's daughter Mark [name] was updated over the phone. She expressed concerns for the amount of PT her father has been getting stating she hoped it would be daily. She also is concerned that he has been using a bed pan rather than getting up to use the bathroom. I explained the evaluation from the last PT note and how he was as of yesterday unable to complete a stand prompting the recommendation for [place] lift at home. I provided her with the number for patient relations to further address her concerns and proceeded to answer all further medical questions to her satisfaction.

## 2023-09-21 LAB — CBC WITH AUTO DIFFERENTIAL
BKR WAM ABSOLUTE IMMATURE GRANULOCYTES.: 0.02 x 1000/ÂµL (ref 0.00–0.30)
BKR WAM ABSOLUTE LYMPHOCYTE COUNT.: 1.62 x 1000/ÂµL (ref 0.60–3.70)
BKR WAM ABSOLUTE NRBC: 0 x 1000/ÂµL (ref 0.00–1.00)
BKR WAM ANC (ABSOLUTE NEUTROPHIL COUNT): 3.02 x 1000/ÂµL (ref 2.00–7.60)
BKR WAM BASOPHIL ABSOLUTE COUNT.: 0.05 x 1000/ÂµL (ref 0.00–1.00)
BKR WAM BASOPHILS: 0.8 % (ref 0.0–1.4)
BKR WAM EOSINOPHIL ABSOLUTE COUNT.: 0.79 x 1000/ÂµL (ref 0.00–1.00)
BKR WAM EOSINOPHILS: 12.9 % — ABNORMAL HIGH (ref 0.0–5.0)
BKR WAM HEMATOCRIT: 25.1 % — ABNORMAL LOW (ref 38.50–50.00)
BKR WAM HEMOGLOBIN: 7.6 g/dL — ABNORMAL LOW (ref 13.2–17.1)
BKR WAM IMMATURE GRANULOCYTES: 0.3 % (ref 0.0–1.0)
BKR WAM LYMPHOCYTES: 26.4 % (ref 17.0–50.0)
BKR WAM MCH: 23.4 pg — ABNORMAL LOW (ref 27.0–33.0)
BKR WAM MCHC: 30.3 g/dL — ABNORMAL LOW (ref 31.0–36.0)
BKR WAM MCV: 77.2 fL — ABNORMAL LOW (ref 80.0–100.0)
BKR WAM MONOCYTE ABSOLUTE COUNT.: 0.64 x 1000/ÂµL (ref 0.00–1.00)
BKR WAM MONOCYTES: 10.4 % (ref 4.0–12.0)
BKR WAM MPV: 9.8 fL (ref 8.0–12.0)
BKR WAM NEUTROPHILS: 49.2 % (ref 39.0–72.0)
BKR WAM NUCLEATED RED BLOOD CELLS: 0 % (ref 0.0–1.0)
BKR WAM PLATELETS: 322 x1000/ÂµL (ref 150–420)
BKR WAM RDW-CV: 17 % — ABNORMAL HIGH (ref 11.0–15.0)
BKR WAM RED BLOOD CELL COUNT.: 3.25 M/ÂµL — ABNORMAL LOW (ref 4.00–6.00)
BKR WAM WHITE BLOOD CELL COUNT: 6.1 x1000/ÂµL (ref 4.0–11.0)

## 2023-09-21 NOTE — Plan of Care
 Plan of Care Overview/ Patient StatusProblem: Adult Inpatient Plan of CareGoal: Plan of Care ReviewOutcome: Interventions implemented as appropriate [id number]. HOH, bilateral hearing aids on. No sob. RA. VSS. Foley catheter in place. Hematuria, no clots. Denies pain. Ax1 T&P in bed.  Inc of stool @ times. Inc care provided. Skin barrier applied. Position off wound. IV abx for uti continued until , see notes. Safety rounds done. Bed alarm ON. Possible Dc to home w/ VNA once IV abx  completed. 0500h Labs drawn. C/O HA, tylenol  po given w/ +effect. No acute events. Will monitor.

## 2023-09-21 NOTE — Plan of Care
 Plan of Care Overview/ Patient StatusPt medically cleared for dc to home. Daughter prefers to bring him back to home. Family is there to support him at home. Hoyer lift delivery is scheduled for today.[facility] secured for SN,PT &HHA.W10 updatedPt has RW, rolator, WC, hospital bed at home.BA to arrange ambulance for dc transportationCase Management Plan  Flowsheet Row Most Recent Value Discharge Planning  Patient/Patient Representative was presented with a list of facilities, agencies and/or dme providers and Referral(s) placed for: Durable medical equipment (add comment for specifics), Homecare services Bed has been secured and is available on: 09/17/23 Facility Name [facility] Required Nursing, Physical Therapy, Home Health Aide Homecare Services available on 09/21/23 Homecare [facility] Equipment Needed After Discharge lift device DME delivery scheduled for: 09/21/23 DME Delivered to delivery is scheduled for today DME Company [employer] [facility] Mode of Transportation  Ambulance (add comment for special considerations) Discharge Readiness  PASRR completed and approved N/A Authorization number obtained, if required N/A Is there a 3 day INPATIENT Qualifying stay for Medicare Patients? N/A DME Authorized/Delivered N/A No needs identified/ follow up with PCP/MD/Outpatient Provider Yes Post acute care services secured W10 complete Yes PRI Completed and Accepted ([facility]  Patients Only) N/A Is the destination address correct on the W10 N/A Last Bowel Movement 09/20/23 Finalized Plan  Expected Discharge Date 09/21/23 Discharge Disposition Home-Health Care Services  Correne Lalani [facility] RN,BSNCare Manager

## 2023-09-21 NOTE — Discharge Summary
 [facility] Dis[facility] SummaryPatient Data:  Patient Name: Mark [name] Admit date: 09/03/2023 Age: 89 y.o. Discharge date: 09/21/2023 DOB: 1925/04/10	 Discharge Attending Physician: Nicolas Maude HERO., MD  MRN: [id number]	 Discharged Condition: good PCP: Josepha Rush, MD  Disposition: Home with Homecare Services Principal Diagnosis: SyncopeHematuriaPseudomonas UTIBilateral hydronephrosis Concern for prostate cancer with likely bony metastasis Comorbidities Comorbidities present on admission:Chronic Heart Failure with preserved ejection fraction, last EF: 65% 05/10/2023. 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)Acute/Chronic AnemiaPMH of COVID  Secondary diagnoses occurring during hospitalization:Presumed metastatic prostate cancerPseudomonas UTIUrinary retentionHypocalcemiaHyponatremia  Post Discharge Follow Up Items: Issues to be Addressed Post Discharge:Follow-up with Medical OncologyFollow-up with UrologyMedication changes on discharge (Full medication list at conclusion of this summary) :Pending Labs and Tests: Pending Lab Results   Order Current Status  FoundationOne Liquid CDx Preliminary result  Follow-up Information:Hurwitz, Jenniger Figiel E, MD35 [address] StFl [facility] [place] [phone] an appointment as soon as possible for a visitYou have been referred and Medical Oncology should contact you regarding an appointment.  If you don't hear from them within 5 days, please call and remind them that you need an appointment.Josepha Rush, MD[address] [phone] an appointment as soon as possible for a visitCall and schedule an appointment to be seen by Primary Care within 1 to 2 weeks of discharge from Skilled Nursing Facility. Future Appointments Date Time Provider Department Center 09/26/2023 10:00 AM Manette Arley BRAVO, MD ORTHO&REHAB [facility] 10/05/2023  3:15 PM Vermell Ozell BRAVO, MD [facility] URO ONC [facility] Course: 89 y.o. male PMHx HFpEF, CAD, HTN, HLD, AAA, BPH s/p chronic Foley, CKD stage 4/5, b/l hearing loss, and GERD presented on 7/6 with chest pain, found to have small PE, started on Eliquis . Also being treated for borderline UTI with hematuria. [place] incidentally found sclerotic lesion of L femur and L-sided ribs iso elevated PSA, c/f possible prostate malignancy.  Hospital course by problems as below.Sclerotic Lesion L Femur, L-sided ribsPresumed metastatic prostate cancerCT A/P 7/6 showed Ill-defined sclerotic lesion w/I intertrochanteric region of L femur is new compared to remote prior studies. Subtle sclerotic lesions w/I left-sided ribs, this is favored to represent a metastatic lesion, possibly in setting of prostate cancer.  CTA PE 7/6 showed small sclerotic foci in the ribs may be sequelae of prior trauma, fibrous dysplasia although sclerotic lesions are not excluded. PSA was elevated at 140. Given concern for prostate malignancy, Medical Oncology, Urology and Orthopedics were consulted. Chest X-ray and Left femur X-ray were done and unremarkable. Ortho recommended prostate biopsy and L femur needle biopsy.  Oncology recommended obtaining prostate biopsy as well but patient and family declined invasive biopsy.  Oncology obtained a FoundationOne Liquid test and recommended starting Casodex  for presumed metastatic prostate cancer.   Pseudomonas UTI iso Chronic Foley Hematuria 2/2 UTI verus possible prostate cancer [place] A/P 7/6 showed stable mod-severe hydroureteronephrosis, chronic cystitis. Given Ucx 7/6 with borderline pseudomonas, he was started on 7-day tobramycin  (dosed once by pharmacy, which lasted in pt's sytem given slow clearance). Also developed hematuria this admission with red-tinged urine, which Urology evaluated and upsized his Foley catheter.  US  bladder rendered non-diagnostic as bladder was seen collapsed by foley catheter.  Urology recommended a 14-day course of antibiotic therapy for UTI and patient was given cipro  after tobramycin  to complete a 14-day total course. Patient switched from Cipro  to Zosyn  out of concern for side effects in his age group of flouroquinolones (namely c diff colitis and achilles tendon rupture). Patient remained inpatient to complete  Zosyn  for total 14-day course. He will follow up with urology on 10/05/2023 which will include voiding trial. Acute on Chronic Microcytic AnemiaHgb nadir at 6.9 on 7/11, which he received 1 unit of pRBC. Remained stable throughout the remainder of his hospital stay. Small PESmall segmental PE in a lingular pulm artery found on CTA PE 7/6 which is thought to have driven troponinemia.  Patient was started on Eliquis . New Neck PainReported on 7/9. Seems positional. No acute red flag symptoms. Started on lidocaine  patch.Stable AAAStable infrarenal AAA measuring up to 5.2 cm found on [place].  Pt follows Cardiologist for surveillance.CAD (s/p PCI in 2019)Continued rosuvastatin  40 mg daily. Restarted Plavix  and discontinued aspirin  now that patient has been started on full anti-coagulation as per 2020 [facility] Expert Consensus Pathway for Anticoagulant and Antiplatelet Therapy.Inpatient Consultants and summary of recommendations:Urology, Medical Oncology, Orthopedics, Pertinent Procedures or Surgeries: N/aData: Pertinent lab findings:Recent Labs Lab 07/22/250514 07/23/250520 07/24/250449 WBC 6.2 6.5 6.1 HGB 8.7* 8.1* 7.6* HCT 28.50* 26.30* 25.10* PLT 343 330 322  Recent Labs Lab 07/22/250514 07/23/250520 07/24/250449 NEUTROPHILS 56.6 54.7 49.2  Recent Labs Lab 07/21/250519 07/22/250514 07/23/250520 NA 138 138 137 K 4.9 4.8 4.7 CL 105 106 105 CO2 21 20 20  BUN 42* 40* 40* CREATININE 3.33* 3.26* 3.45* GLU 105* 103* 96 ANIONGAP 12 12 12   Recent Labs Lab 07/19/250500 07/21/250519 07/22/250514 07/23/250520 CALCIUM  8.4* 8.3* 8.3* 8.6* PHOS 4.6*  --   --   --   No results for input(s): ALT, AST, ALKPHOS, BILITOT, BILIDIR in the last 168 hours. No results for input(s): PTT, LABPROT, INR in the last 168 hours. Microbiology:No results for input(s): LABBLOO, LABURIN, LOWERRESPIRA in the last 168 hours.Imaging: Imaging results last 1 week:  No results found.Diet:  Diet Renal Non DialysisMobility: Highest Level of mobility - ACTUAL: Mobility Level 2, Turn self in bed/bed activity/dependent transfer, AM PAC 6-7Physical Therapy Disposition Recommendation: Moderate complexityAdditional Therapy Recommendations: Physical Therapy Services in Discharge Environment; Occupational Therapy Services in Discharge EnvironmentPhysical Exam Discharge vital signs: Vitals:  09/21/23 0835 BP: 104/60 Pulse: 76 Resp: 19 Temp: 97.4 ?F (36.3 ?C) Cognitive Status at Discharge: BaselinePhysical ExamConstitutional:     Appearance: Normal appearance. He is not ill-appearing or diaphoretic. Cardiovascular:    Rate and Rhythm: Normal rate and regular rhythm.    Pulses: Normal pulses.    Heart sounds: Normal heart sounds. No murmur heard.Pulmonary:    Effort: Pulmonary effort is normal. No respiratory distress.    Breath sounds: Normal breath sounds. Abdominal:    General: Abdomen is flat. Bowel sounds are normal.    Palpations: Abdomen is soft. Musculoskeletal:       General: No swelling or tenderness. Normal range of motion. Skin:   General: Skin is warm and dry. Neurological:    Mental Status: He is alert and oriented to person, place, and time. Mental status is at baseline. History  Allergies Allergies     Noted Reactions  Ace Inhibitors 05/05/2015   Cough    PMH PSH Past Medical History[1] Past Surgical History[2] Social History Family History Social History Tobacco Use  Smoking status: Former   Passive exposure: Never  Smokeless tobacco: Never Substance Use Topics  Alcohol  use: Not Currently  Family History Problem Relation Age of Onset  Heart disease Mother     Discharge Medications  Discharge:  Medication List  START taking these medications  apixaban  5 mg Tab tabletCommonly known as: ELIQUISTake 1 tablet (5 mg total) by mouth every 12 (twelve) hours. bicalutamide  50 mg tabletCommonly known  as: CASODEXTake 1 tablet (50 mg total) by mouth daily. hoyer liftUse as directed. lidocaine  4 % topical patchPlace 1 patch over 12 hours onto the skin every 24 hours. Remove & Discard patch within 12 hours or as directed by MD  CONTINUE taking these medications  acetaminophen  325 mg tabletCommonly known as: TYLENOLTake 2 tablets (650 mg total) by mouth every 6 (six) hours as needed. atorvastatin  80 mg tabletCommonly known as: LIPITORTake 1 tablet (80 mg total) by mouth daily. b complex vitamins tabletCommonly known as: B COMPLEX camphor-menthoL  0.5-0.5 % lotionCommonly known as: SARNAApply topically 2 (two) times daily as needed for itching. clonazePAM  0.5 mg tabletCommonly known as: KlonoPIN  clopidogreL  75 mg tabletCommonly known as: PLAVIXTake 1 tablet (75 mg total) by mouth daily. * escitalopram  oxalate 5 mg tabletCommonly known as: LEXAPRO  * escitalopram  oxalate 10 mg tabletCommonly known as: LEXAPROTake 1 tablet (10 mg total) by mouth daily. gabapentin  100 mg capsuleCommonly known as: NEURONTINTake 2 capsules (200 mg total) by mouth at bedtime. nitroGLYCERIN  0.4 mg SL tabletCommonly known as: NITROSTAT  polyethylene glycol 17 gram/dose powderCommonly known as: GLYCOLAX ; MIRALAX  senna 8.6 mg tabletCommonly known as: SENOKOTTake 2 tablets (17.2 mg total) by mouth 2 (two) times daily. sodium bicarbonate  650 mg tabletTake 1 tablet (650 mg total) by mouth 3 (three) times daily.   * This list has 2 medication(s) that are the same as other medications prescribed for you. Read the directions carefully, and ask your doctor or other care provider to review them with you.    STOP taking these medications  aspirin  81 mg EC delayed release tablet HYDROcodone -acetaminophen  5-325 mg per tabletCommonly known as: NORCO   Where to Get Your Medications  These medications were sent to [facility] [address] [facility] [place] - [address]  [address] [place] [address]  Phone: [phone] bicalutamide  50 mg tablet Information about where to get these medications is not yet available  Ask your nurse or doctor about these medicationsapixaban 5 mg Tab tablethoyer liftlidocaine 4 % topical patch   50 minutes spent on the discharge of this patientElectronically Signed:Kaio Kuhlman, APRN, FNP-C7/24/2025 6:25 PMBest Contact Information: [phone] [1] Past Medical History:Diagnosis Date  (HFpEF) heart failure with preserved ejection fraction (HC Code)    AAA (abdominal aortic aneurysm) (HC Code) 05/05/2015  Formatting of this note might be different from the original.  2016 3.6 cm US  in [place], 2018 4 cm, no further f/u recommended by Vasc    Allergic rhinitis 07/24/2019  Bilateral hearing loss 01/28/2019  BPH (benign prostatic hyperplasia)   CAD (coronary artery disease) 05/05/2015  Carotid artery stenosis 06/07/2016  Chronic coronary artery disease   Chronic sinusitis 07/24/2019  CKD (chronic kidney disease) stage 4, GFR 15-29 ml/min (HC Code)    Gastroesophageal reflux disease without esophagitis 05/05/2015  GERD (gastroesophageal reflux disease)   History of coronary artery stent placement 06/07/2016 Hyperlipidemia   Hypertension   Microcytic anemia   Mixed hyperlipidemia 07/24/2019  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

## 2023-09-21 NOTE — Plan of Care
 Inpatient Physical Therapy Progress Note IP Adult PT Eval/Treat - 09/21/23 0859    Date of Visit / Treatment  Date of Visit / Treatment 09/21/23   Start Time 0832   End Time 0859   Total Treatment Time 27    General Information  Subjective  I am very tired.   Other Providers Rehab associate [place].   General Observations RN cleared PT. Pt supine in bed EP 8-8. Foley.   Precautions/Limitations Fall Precautions;Bed alarm   Precautions/Limitations Comment per ortho: WBAT with cane or walker    Weight Bearing Status  Weight Bearing Status Comments per ortho: WBAT with cane or walker    Vital Signs and Orthostatic Vital Signs  Vital Signs Vital Signs Stable   Vital Signs Free text Lightheaded. Fatigues quickly.    Pain/Comfort  Pain Comment (Pre/Post Treatment Pain) No c/o pain   Pain Rating at Rest 0/10 - no pain   Pain Rating with Activity 0/10 - no pain    Cognition  Orientation Level Oriented to person;Oriented to place   Level of Consciousness alert   Following Commands Follows one step commands with increased time;Follows one step commands with repetition   Personal Safety / Judgment Fall risk;Requires supervision with mobility;Decreased awareness of need for safety;Decreased recognition / insight of own deficits   Attention Requires redirection;Requires cues    Vision/ Hearing  Hearing difficulties / Use of hearing aids HOH    Manual Muscle Testing  Manual Muscle Testing Comments deconditioned    Musculoskeletal  LUE Muscle Strength Grading 3-->active movement against gravity   RUE Muscle Strength Grading 3-->active movement against gravity   LLE Muscle Strength Grading 3-->active movement against gravity   RLE Muscle Strength Grading 3-->active movement against gravity    Skin Assessment  Skin Assessment See Nursing Documentation    Balance  Sitting Balance: Static  FAIR+     Maintains static position without assist or device, may require Supervision or Verbal Cues (>2 minutes)   Sitting Balance: Dynamic  FAIR      Performs dynamic activities through 75% range Contact Guard or partial range (50-75%) with Supervision   Standing Balance: Static FAIR-      Contact Guard to maintain static position with no Assistive Device   Standing Balance: Dynamic  POOR+   Moves through 1/2 range with minimal assist to right self   Balance Assist Device Rolling walker   Balance Skills Training Comment A x 1    Bed Mobility  Supine-to-Sit Independence/Assistance Level Minimum assist;Assist of 1   Supine-to-Sit Assist Device Hand held assist;Head of bed elevated   Sit-to-Supine Independence/Assistance Level Minimum assist;Assist of 1   Sit-to-Supine Assist Device Hand held assist   Bed Mobility, Impairments strength decreased;decreased endurance/activity tolerance;impaired balance   Bed Mobility Comments Assisted with trunk and LEs.    Sit-Stand Transfer Training  Symptoms Noted During/After Treatment Marketing executive) fatigue   Sit-to-Stand Transfer Independence/Assistance Level Moderate assist;Assist of 1   Sit-to-Stand Transfer Assist Device Rolling walker   Stand-to-Sit Transfer Independence/Assistance Level Moderate assist;Assist of 1   Stand-to-Sit Transfer Assist Device Rolling walker   Transfer Safety Analysis Impairments impaired balance;decreased strength;decreased endurance/activity tolerance   Sit-Stand Transfer Comments Fatigues very quickly. Unsteady. Fall risk.    Gait Training  Independence/Assistance Level  Assist of 2;Moderate assist   Assistive Device  Rolling walker   Gait Distance sidesteps   Gait Analysis Impairments impaired balance;decreased strength;decreased endurance/activity tolerance   Ambulation distance was limited by: fatigue  Gait Training Comments Unsteady. Fatigues quickly. High fall risk.    Handoff Documentation Handoff Patient in bed;Bed alarm;Patient instructed to call nursing for mobility;Discussed with nursing    Activity Tolerance  Activity Tolerance Comments Fair-   Activity Tolerance Fatigues quickly    PT- AM-PAC - Basic Mobility Screen- How much help from another person do you currently need.....  Turning from your back to your side while in a a flat bed without using rails? 3 - A Little - Requires a little help (supervision, minimal assistance). Can use assistive devices.   Moving from lying on your back to sitting on the side of a flat bed without using bed rails? 3 - A Little - Requires a little help (supervision, minimal assistance). Can use assistive devices.   Moving to and from a bed to a chair (including a wheelchair)? 2 - A Lot - Requires a lot of help (maximum to moderate assistance). Can use assistive devices.   Standing up from a chair using your arms(e.g., wheelchair or bedside chair)? 2 - A Lot - Requires a lot of help (maximum to moderate assistance). Can use assistive devices.   To walk in a hospital room? 1 - Total - Requires total assistance or cannot do it at all.   Climbing 3-5 steps with a railing? 1 - Total - Requires total assistance or cannot do it at all.   AMPAC Mobility Score 12   TARGET Highest Level of Mobility Mobility Level 4, Transfer to chair   ACTUAL Highest Level of Mobility Mobility Level 4, Transfer to chair    Therapeutic Exercise  Therapeutic Exercise Comments Seated (B) LE AROM exercises    Clinical Impression  Follow up Assessment Pt is A x 1 with bed mobility. Requires A x 2 and RW to perform sit to stand transfers. Deconditioned. Fatigues quickly. High fall risk. Decreased insight into deficits. Pt will benfit from PT services to gain strength, balance and endurance to return to baseline.    Patient/Family Stated Goals  Patient/Family Stated Goal(s) return home    Frequency/Equipment Recommendations  PT Frequency 3x per week   Next Treatment Expected 09/25/23   PT/PTA completing this assessment DSG    PT Recommendations for Inpatient Admission  Activity/Level of Assist transfers only;assist of 2;mechanical lift   ADL Recommendations assist of 2   Positioning chair position of the bed;reposition frequently;elevate heels   Therapeutic Exercise ROM as tolerated    Planned Treatment / Interventions  Plan for Next Visit Progress as tol   Training Treatment / Interventions Balance / Investment banker, operational;Endurance Training;Functional Personnel officer / Interventions Patient Education / Training    PT Discharge Summary  Physical Therapy Disposition Recommendation Moderate complexity support and therapy to progress functional mobility/ ADLs/ IADLs recommended for post- acute care.  See assessment for additional details.   Additional Therapy Recommendations Physical Therapy Services in Discharge Environment   Equipment Recommendations for Discharge DARCIA FINDER - The patient will use a Rolling Walker in the home and outside daily to provide greater stability and safer ambulation for participation in ADLs   if returning home requires hoyer lift, hospital bed, wc, stretcher transport   Trenia Killian, PTAMHB [phone]

## 2023-09-22 ENCOUNTER — Telehealth: Admit: 2023-09-22 | Payer: PRIVATE HEALTH INSURANCE | Primary: Internal Medicine

## 2023-09-22 ENCOUNTER — Encounter: Admit: 2023-09-22 | Payer: PRIVATE HEALTH INSURANCE | Primary: Internal Medicine

## 2023-09-22 MED ORDER — APIXABAN 5 MG TABLET
5 | ORAL_TABLET | Freq: Two times a day (BID) | ORAL | 1 refills | 30.00000 days | Status: AC
Start: 2023-09-22 — End: ?

## 2023-09-22 MED ORDER — LIDOCAINE 4 % TOPICAL PATCH
4 | MEDICATED_PATCH | TRANSDERMAL | 1 refills | 30.00000 days | Status: AC
Start: 2023-09-22 — End: ?

## 2023-09-22 NOTE — Plan of Care
 Plan of Care Overview/ Patient StatusCall received from Daughter, Mark [name], very upset re: patient's dc yesterday stating that he was not scripted his eliquis  and that he has now missed 2 doses of it. She needs the script sent to [facility] immediately. Call placed to Katie Ned who was CM that d/c'd patient yesterday to follow up with daughter/medical team re: script. She verbalized understanding and will reach out to daughter.Mark [name], CNL, MSN, RN, BSRN Case ManagerYale Greater Ny Endoscopy Surgical Center

## 2023-09-22 NOTE — Telephone Encounter
 7/25/202511:30 AMCall placed to [name] who is Mr. Aguayo daughter and primary care taker. I have resent the Eliquis  prescription to [facility] per family request. [name] updated on plan of care, Ok to give first dose this morning and the second dose later this evening between 9-10pm so he receives full Two doses today and then continue on dosing 9am and 9pm.Electronically Signed by Ozell FORBES Rummage, APRN, September 22, 2023

## 2023-09-23 ENCOUNTER — Telehealth: Admit: 2023-09-23 | Payer: PRIVATE HEALTH INSURANCE | Primary: Internal Medicine

## 2023-09-23 NOTE — Progress Notes
 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 [facility] saying he is still having hematuria and wanted to ensure Alanna was aware. Hematuria was documented in notes and daughter says he was having hematuria on discharge as well. He does not appear to have scheduled follow up with urology yet so I advised nurse to call the urology line at [facility] to see if they want to see him sooner. His Hb wsa 7.6 on discharge but his vitals are stable. Will alert medical oncology team who will see him on 8/7

## 2023-09-26 ENCOUNTER — Encounter: Admit: 2023-09-26 | Payer: PRIVATE HEALTH INSURANCE | Attending: Orthopedic Surgery | Primary: Internal Medicine

## 2023-09-26 ENCOUNTER — Ambulatory Visit: Admit: 2023-09-26 | Payer: PRIVATE HEALTH INSURANCE | Attending: Orthopedic Surgery | Primary: Internal Medicine

## 2023-09-26 VITALS — Ht 71.0 in | Wt 180.0 lb

## 2023-09-26 DIAGNOSIS — I6529 Occlusion and stenosis of unspecified carotid artery: Secondary | ICD-10-CM

## 2023-09-26 DIAGNOSIS — E782 Mixed hyperlipidemia: Secondary | ICD-10-CM

## 2023-09-26 DIAGNOSIS — I2510 Atherosclerotic heart disease of native coronary artery without angina pectoris: Secondary | ICD-10-CM

## 2023-09-26 DIAGNOSIS — K219 Gastro-esophageal reflux disease without esophagitis: Secondary | ICD-10-CM

## 2023-09-26 DIAGNOSIS — D568 Other thalassemias: Secondary | ICD-10-CM

## 2023-09-26 DIAGNOSIS — H9193 Unspecified hearing loss, bilateral: Secondary | ICD-10-CM

## 2023-09-26 DIAGNOSIS — I10 Essential (primary) hypertension: Secondary | ICD-10-CM

## 2023-09-26 DIAGNOSIS — I714 AAA (abdominal aortic aneurysm) (HC Code): Secondary | ICD-10-CM

## 2023-09-26 DIAGNOSIS — M4800 Spinal stenosis, site unspecified: Secondary | ICD-10-CM

## 2023-09-26 DIAGNOSIS — D509 Iron deficiency anemia, unspecified: Secondary | ICD-10-CM

## 2023-09-26 DIAGNOSIS — N400 Enlarged prostate without lower urinary tract symptoms: Secondary | ICD-10-CM

## 2023-09-26 DIAGNOSIS — C61 Malignant neoplasm of prostate: Principal | ICD-10-CM

## 2023-09-26 DIAGNOSIS — I5030 Unspecified diastolic (congestive) heart failure: Secondary | ICD-10-CM

## 2023-09-26 DIAGNOSIS — I252 Old myocardial infarction: Secondary | ICD-10-CM

## 2023-09-26 DIAGNOSIS — E785 Hyperlipidemia, unspecified: Principal | ICD-10-CM

## 2023-09-26 DIAGNOSIS — J309 Allergic rhinitis, unspecified: Secondary | ICD-10-CM

## 2023-09-26 DIAGNOSIS — J329 Chronic sinusitis, unspecified: Secondary | ICD-10-CM

## 2023-09-26 DIAGNOSIS — N184 Chronic kidney disease, stage 4 (severe): Secondary | ICD-10-CM

## 2023-09-26 DIAGNOSIS — Z955 Presence of coronary angioplasty implant and graft: Secondary | ICD-10-CM

## 2023-09-26 NOTE — Patient Instructions
 Return to see me in 2 months with x-ray left hip at time of visit.

## 2023-09-26 NOTE — Telephone Encounter
 Copied from CRM #[id number]. Topic: Scheduling - Schedule Appointment>> Sep 26, 2023  5:19 PM Elim Peale R wrote:Placing outbound call to Mark [name], Mark [name] to schedule an appointment.Spoke with patient's daughter  and scheduled RNP appointment with Keri Romeo Com[place], PA on 10/18/23.

## 2023-09-26 NOTE — Progress Notes
 History of present illness:Mark [name] is a 89 y.o. male who presents today at the request of his primary care provider, Dr. Norleen Campi, for evaluation of bone changes in his left proximal femur and concerning for metastatic disease.  This is in the context of chronic renal disease (including infection, a chronic foley, and recent PSA of 140) as well as chronic cardiovascular disease.  He has not had any pain recently according to his daughter, but has been seen in the ED earlier this month with pain in the region of his coccyx and imaging studies including a [place] scan of his pelvis on 09/03/2023 that demonstrated sclerotic changes in his proximal left femur worrisome for metastatic disease and plain x-rays of his femur on 09/07/2023 that did not demonstrate clear changes of this nature. Apparently biopsy of his prostate has been raised but according to his daughter, Mr. Kester was opposed to this procedure. He has been referred to the care of Dr. Ozell Saint (uro-onc).  He has been ambulating with a wheelchair.Past medical history and review of systems are outlined below. I have reviewed the review of systems, and it is listed in the clinical support section of the record.   Past medical history: He  has a past medical history of (HFpEF) heart failure with preserved ejection fraction (HC Code), AAA (abdominal aortic aneurysm) (HC Code) (05/05/2015), Allergic rhinitis (07/24/2019), Bilateral hearing loss (01/28/2019), BPH (benign prostatic hyperplasia), CAD (coronary artery disease) (05/05/2015), Carotid artery stenosis (06/07/2016), Chronic coronary artery disease, Chronic sinusitis (07/24/2019), CKD (chronic kidney disease) stage 4, GFR 15-29 ml/min (HC Code), Gastroesophageal reflux disease without esophagitis (05/05/2015), GERD (gastroesophageal reflux disease), History of coronary artery stent placement (06/07/2016), Hyperlipidemia, Hypertension, Microcytic anemia, Mixed hyperlipidemia (07/24/2019), Old myocardial infarct (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), Other thalassemia (HC Code) (07/24/2019), and Spinal stenosis.Past surgical history: He  has a past surgical history that includes Cholecystectomy; Appendectomy; Fracture surgery; and Bladder surgery (03/01/2019).Family history: His family history includes Heart disease in his mother.Social history:  Social History Occupational History  Not on file Tobacco Use  Smoking status: Former   Passive exposure: Never  Smokeless tobacco: Never Vaping Use  Vaping status: Never Used Substance and Sexual Activity  Alcohol  use: Not Currently  Drug use: Never  Sexual activity: Not on file Allergies: Ace inhibitorsMedications: He has a current medication list which includes the following prescription(s): acetaminophen , apixaban , atorvastatin , b complex vitamins, bicalutamide , clonazepam , clopidogrel , escitalopram  oxalate, escitalopram  oxalate, gabapentin , polyethylene glycol, senna, sodium bicarbonate , camphor-menthol , hoyer lift, lidocaine , and nitroglycerin .Exam: Mr. Vester vital signs are: Ht 5' 11 (1.803 m)  - Wt 81.6 kg  - BMI 25.10 kg/m? On physical evaluation today, Mr. Schmuhl appears elderly and withdrawn although clearly following our conversation with his daughter.  He did not personally answer any questions verbally during our encounter, but did appropriately shake his head to convey his answers on occasion.  Passive range of motion of his left hip is without apparent pain.  He has a foley in place and is seated in a wheelchair.Imaging: His plain x-ray of his left femur and [place] scan of his pelvis were available with the findings mentioned above.  There does not appear to be significant involvement of his femoral cortex to raise concern for impending pathological fracture.Impression:  Elevated PSA (140) in a gentleman with a chronic indwelling foley catheter, suggestive of prostate CA but without tissue confirmation that I could find at this timeSclerotic intramedullary changes in the proximal left femur, without significant cortical changes, that are consistent with but not  diagnostic of metastatic disease, and without concern for impending pathological fracture and asymptomatic.Plan:I reviewed the imaging studies and my impressions with Mr. Yeagle and his daughter.  He does not have any weight-bearing restrictions given his current imaging.  I would suggest interval plain x-rays of his left hip, next in 2 months, to help clarify if aggressive (metastatic) disease is present and/or the use of additional imaging to better clarify the presence of metastatic changes, such as MRI of his left hip and either bone scan or PET/[place] for prognostic purposes.  [place] guided needle biopsy is an option if a tissue diagnosis would alter treatment approaches, and any evidence of cortical destruction in the future could be addressed with XRT.  I would prefer to co-ordinate anything other than surveillance x-rays with Dr. Vermell moving forward, and I will see Mr. Laws for this surveillance in 2 months.Our encounter today lasted 45 minutes.Electronically signed by: Arley FORBES Milder, MDProfessor & Chair [facility]

## 2023-09-26 NOTE — Progress Notes
 Review of Systems Musculoskeletal:       NP: Prostate cancer with possible mets. Denies pain, [place] of the pelvis showed something on left femur.Pain: 0/10, wheelchair bound due to vasovagal

## 2023-09-28 ENCOUNTER — Encounter: Admit: 2023-09-28 | Payer: PRIVATE HEALTH INSURANCE | Primary: Internal Medicine

## 2023-09-29 ENCOUNTER — Telehealth: Admit: 2023-09-29 | Payer: PRIVATE HEALTH INSURANCE | Primary: Internal Medicine

## 2023-09-29 NOTE — Telephone Encounter
 Reason for call: Nurse is calling to report findings of hematuria found in collection bag which started today possibly due to catheter displacement. Nurse was able to make adjustments to catheter. Vitals are normal, no fever, no chills, no pain or discomfort. Please call to advise. Contacted Emmie the nurse she stated that Mark [name] foley bag had blood in his bag this AM.  She stated there was no stat lock on his upper thigh and there is one now and she adjusted the foley catheter.  She stated the urine coming out was much better after the adjustment.  She encouraged fluids to help and he is not in any pain.

## 2023-09-29 NOTE — Telephone Encounter
 Copied from CRM #[id number]. Topic: General Message - [facility]>> Sep 29, 2023  3:40 PM wrote:[facility] MESSAGETime of call:   3:40 PMCaller:   [employer] NurseSpecialist you are calling for:  Mark [name], APRNReason for call:   Nurse is calling to report findings of hematuria found in collection bag which started today possibly due to catheter displacement. Nurse was able to make adjustments to catheter. Vitals are normal, no fever, no chills, no pain or discomfort. Please call to advise.Does caller request to speak to someone urgently?  no   Best telephone number for callback:   [facility] Nurse ph# [phone] Best time to return call:   anytime Permission to leave message:  yes

## 2023-10-04 ENCOUNTER — Ambulatory Visit: Admit: 2023-10-04 | Payer: PRIVATE HEALTH INSURANCE | Attending: Nephrology | Primary: Internal Medicine

## 2023-10-05 ENCOUNTER — Encounter: Admit: 2023-10-05 | Payer: PRIVATE HEALTH INSURANCE | Attending: Medical Oncology | Primary: Internal Medicine

## 2023-10-16 ENCOUNTER — Telehealth: Admit: 2023-10-16 | Payer: PRIVATE HEALTH INSURANCE | Attending: Medical Oncology | Primary: Internal Medicine

## 2023-10-16 NOTE — Telephone Encounter
 Call received from patient's daughter, [name].  She states that her [facility] (had the option of inpt hospice but preferred to stay at home) and they were in the middle of preparing the home (getting hospital bed, etc).  She state that her father isn't mobile and per the hospital dc summary had a hoyer lift delivered to the home for transfers in and out of bed.  Pt was has ESRD and was incidentally found to have likely metastatic prostate cancer while he was admitted to the hospital a few weeks ago with plan for outpatient follow up and treatment planning.  While he was admitted he was started on bicalutamide  and foundation one testing was performed and resulted.  She was asking if the appt was necessary.  While on the phone with her she sounded overwhelmed and was distressed with end of life planning for her father.  Offered to transition the appt to telehealth to close the loop with regards to his prostate cancer diagnosis but did state that if hospice was initiated that he likely would not be able to start any new treatments.  She did state that he was in a lot of pain and his nephrologist stated that he only has about 2 months left.  They do have a comfort pack from hospice for pain control, including morphine  as well.  At the end of our conversation she decided that it was easiest to cancel the appt and will call us  back if anything changes.  She was appreciative of the time spent on the phone with her.  Appt tomorrow to be cancelled.

## 2023-10-17 ENCOUNTER — Ambulatory Visit: Admit: 2023-10-17 | Payer: PRIVATE HEALTH INSURANCE | Attending: Medical Oncology | Primary: Internal Medicine

## 2023-10-18 ENCOUNTER — Ambulatory Visit: Admit: 2023-10-18 | Payer: PRIVATE HEALTH INSURANCE | Primary: Internal Medicine

## 2023-10-19 ENCOUNTER — Ambulatory Visit: Admit: 2023-10-19 | Payer: PRIVATE HEALTH INSURANCE | Primary: Internal Medicine

## 2023-10-30 DEATH — deceased

## 2023-11-29 ENCOUNTER — Encounter: Admit: 2023-11-29 | Payer: PRIVATE HEALTH INSURANCE | Attending: Orthopedic Surgery | Primary: Internal Medicine

## 2023-11-29 ENCOUNTER — Ambulatory Visit: Admit: 2023-11-29 | Payer: Medicare (Managed Care) | Primary: Internal Medicine

## 2023-11-29 DIAGNOSIS — C61 Malignant neoplasm of prostate: Principal | ICD-10-CM
